# Patient Record
Sex: Male | Born: 1965
Health system: Southern US, Community
[De-identification: ages and names within clinical notes are randomized; demographics above are authoritative.]

## PROBLEM LIST (undated history)

## (undated) DIAGNOSIS — K219 Gastro-esophageal reflux disease without esophagitis: Secondary | ICD-10-CM

## (undated) DIAGNOSIS — I639 Cerebral infarction, unspecified: Secondary | ICD-10-CM

## (undated) DIAGNOSIS — E119 Type 2 diabetes mellitus without complications: Secondary | ICD-10-CM

## (undated) DIAGNOSIS — I10 Essential (primary) hypertension: Secondary | ICD-10-CM

## (undated) HISTORY — PX: NO PAST SURGERIES: SHX2092

---

## 1998-01-16 ENCOUNTER — Emergency Department (HOSPITAL_COMMUNITY): Admission: EM | Admit: 1998-01-16 | Discharge: 1998-01-16 | Payer: Self-pay | Admitting: Emergency Medicine

## 1998-01-17 ENCOUNTER — Encounter: Admission: RE | Admit: 1998-01-17 | Discharge: 1998-01-17 | Payer: Self-pay | Admitting: Internal Medicine

## 1999-01-15 ENCOUNTER — Emergency Department (HOSPITAL_COMMUNITY): Admission: EM | Admit: 1999-01-15 | Discharge: 1999-01-15 | Payer: Self-pay | Admitting: Emergency Medicine

## 1999-01-15 ENCOUNTER — Encounter: Payer: Self-pay | Admitting: Emergency Medicine

## 2016-07-19 ENCOUNTER — Emergency Department (HOSPITAL_COMMUNITY): Payer: 59

## 2016-07-19 ENCOUNTER — Inpatient Hospital Stay (HOSPITAL_COMMUNITY)
Admission: EM | Admit: 2016-07-19 | Discharge: 2016-07-21 | DRG: 065 | Disposition: A | Discharge status: Home / Self Care | Payer: 59 | Attending: Internal Medicine | Admitting: Internal Medicine

## 2016-07-19 ENCOUNTER — Observation Stay (HOSPITAL_COMMUNITY): Payer: 59

## 2016-07-19 ENCOUNTER — Encounter (HOSPITAL_COMMUNITY): Payer: Self-pay | Admitting: Emergency Medicine

## 2016-07-19 DIAGNOSIS — R29810 Facial weakness: Secondary | ICD-10-CM | POA: Diagnosis present

## 2016-07-19 DIAGNOSIS — I63412 Cerebral infarction due to embolism of left middle cerebral artery: Secondary | ICD-10-CM | POA: Diagnosis not present

## 2016-07-19 DIAGNOSIS — R2990 Unspecified symptoms and signs involving the nervous system: Secondary | ICD-10-CM

## 2016-07-19 DIAGNOSIS — Q211 Atrial septal defect: Secondary | ICD-10-CM

## 2016-07-19 DIAGNOSIS — R4781 Slurred speech: Secondary | ICD-10-CM | POA: Diagnosis present

## 2016-07-19 DIAGNOSIS — I6340 Cerebral infarction due to embolism of unspecified cerebral artery: Secondary | ICD-10-CM | POA: Insufficient documentation

## 2016-07-19 DIAGNOSIS — Z87891 Personal history of nicotine dependence: Secondary | ICD-10-CM

## 2016-07-19 DIAGNOSIS — R634 Abnormal weight loss: Secondary | ICD-10-CM

## 2016-07-19 DIAGNOSIS — I639 Cerebral infarction, unspecified: Secondary | ICD-10-CM

## 2016-07-19 DIAGNOSIS — I161 Hypertensive emergency: Secondary | ICD-10-CM | POA: Diagnosis present

## 2016-07-19 DIAGNOSIS — Z833 Family history of diabetes mellitus: Secondary | ICD-10-CM

## 2016-07-19 DIAGNOSIS — I10 Essential (primary) hypertension: Secondary | ICD-10-CM | POA: Diagnosis not present

## 2016-07-19 DIAGNOSIS — I6339 Cerebral infarction due to thrombosis of other cerebral artery: Secondary | ICD-10-CM | POA: Diagnosis not present

## 2016-07-19 DIAGNOSIS — Q2112 Patent foramen ovale: Secondary | ICD-10-CM

## 2016-07-19 DIAGNOSIS — K219 Gastro-esophageal reflux disease without esophagitis: Secondary | ICD-10-CM | POA: Diagnosis present

## 2016-07-19 HISTORY — DX: Gastro-esophageal reflux disease without esophagitis: K21.9

## 2016-07-19 HISTORY — DX: Essential (primary) hypertension: I10

## 2016-07-19 HISTORY — DX: Cerebral infarction, unspecified: I63.9

## 2016-07-19 LAB — COMPREHENSIVE METABOLIC PANEL
ALT: 47 U/L (ref 17–63)
ANION GAP: 9 (ref 5–15)
AST: 46 U/L — ABNORMAL HIGH (ref 15–41)
Albumin: 4.5 g/dL (ref 3.5–5.0)
Alkaline Phosphatase: 44 U/L (ref 38–126)
BILIRUBIN TOTAL: 0.9 mg/dL (ref 0.3–1.2)
BUN: 5 mg/dL — ABNORMAL LOW (ref 6–20)
CALCIUM: 9.9 mg/dL (ref 8.9–10.3)
CO2: 29 mmol/L (ref 22–32)
Chloride: 102 mmol/L (ref 101–111)
Creatinine, Ser: 0.72 mg/dL (ref 0.61–1.24)
GFR calc non Af Amer: >60 mL/min (ref >60)
Glucose, Bld: 113 mg/dL — ABNORMAL HIGH (ref 65–99)
Potassium: 4 mmol/L (ref 3.5–5.1)
Sodium: 140 mmol/L (ref 135–145)
TOTAL PROTEIN: 6.9 g/dL (ref 6.5–8.1)

## 2016-07-19 LAB — CBC
HCT: 37.5 % — ABNORMAL LOW (ref 39.0–52.0)
Hemoglobin: 13.2 g/dL (ref 13.0–17.0)
MCH: 33 pg (ref 26.0–34.0)
MCHC: 35.2 g/dL (ref 30.0–36.0)
MCV: 93.8 fL (ref 78.0–100.0)
PLATELETS: 206 10*3/uL (ref 150–400)
RBC: 4 MIL/uL — AB (ref 4.22–5.81)
RDW: 11.9 % (ref 11.5–15.5)
WBC: 7.3 10*3/uL (ref 4.0–10.5)

## 2016-07-19 LAB — DIFFERENTIAL
Basophils Absolute: 0 10*3/uL (ref 0.0–0.1)
Basophils Relative: 0 %
EOS ABS: 0 10*3/uL (ref 0.0–0.7)
EOS PCT: 1 %
LYMPHS ABS: 1.8 10*3/uL (ref 0.7–4.0)
LYMPHS PCT: 25 %
MONO ABS: 0.6 10*3/uL (ref 0.1–1.0)
Monocytes Relative: 8 %
Neutro Abs: 4.8 10*3/uL (ref 1.7–7.7)
Neutrophils Relative %: 66 %

## 2016-07-19 LAB — PROTIME-INR
INR: 1.02
Prothrombin Time: 13.4 seconds (ref 11.4–15.2)

## 2016-07-19 LAB — APTT: aPTT: 28 seconds (ref 24–36)

## 2016-07-19 LAB — I-STAT TROPONIN, ED: TROPONIN I, POC: 0 ng/mL (ref 0.00–0.08)

## 2016-07-19 LAB — I-STAT CHEM 8, ED
BUN: 7 mg/dL (ref 6–20)
CALCIUM ION: 1.18 mmol/L (ref 1.15–1.40)
Chloride: 101 mmol/L (ref 101–111)
Creatinine, Ser: 0.7 mg/dL (ref 0.61–1.24)
Glucose, Bld: 109 mg/dL — ABNORMAL HIGH (ref 65–99)
HCT: 39 % (ref 39.0–52.0)
HEMOGLOBIN: 13.3 g/dL (ref 13.0–17.0)
Potassium: 4.3 mmol/L (ref 3.5–5.1)
SODIUM: 141 mmol/L (ref 135–145)
TCO2: 32 mmol/L (ref 0–100)

## 2016-07-19 MED ORDER — ACETAMINOPHEN 650 MG RE SUPP: 650.0000 mg | RECTAL | Status: DC | PRN

## 2016-07-19 MED ORDER — ENOXAPARIN SODIUM 40 MG/0.4ML ~~LOC~~ SOLN
40.0000 mg | SUBCUTANEOUS | Status: DC
  Administered 2016-07-19 – 2016-07-20 (×2): 40 mg via SUBCUTANEOUS
  Filled 2016-07-19 (×2): qty 0.4

## 2016-07-19 MED ORDER — PANTOPRAZOLE SODIUM 40 MG PO TBEC
40.0000 mg | DELAYED_RELEASE_TABLET | Freq: Every day | ORAL | Status: DC
  Administered 2016-07-20 – 2016-07-21 (×2): 40 mg via ORAL
  Filled 2016-07-19 (×2): qty 1

## 2016-07-19 MED ORDER — ACETAMINOPHEN 325 MG PO TABS: 650.0000 mg | ORAL_TABLET | ORAL | Status: DC | PRN

## 2016-07-19 MED ORDER — STROKE: EARLY STAGES OF RECOVERY BOOK
Freq: Once | Status: DC
  Filled 2016-07-19: qty 1

## 2016-07-19 MED ORDER — HYDRALAZINE HCL 20 MG/ML IJ SOLN: 5.0000 mg | Freq: Three times a day (TID) | INTRAMUSCULAR | Status: DC | PRN

## 2016-07-19 MED ORDER — ACETAMINOPHEN 160 MG/5ML PO SOLN: 650.0000 mg | ORAL | Status: DC | PRN

## 2016-07-19 MED ORDER — GADOBENATE DIMEGLUMINE 529 MG/ML IV SOLN
15.0000 mL | Freq: Once | INTRAVENOUS | Status: AC | PRN
  Administered 2016-07-19: 15 mL via INTRAVENOUS

## 2016-07-19 NOTE — Progress Notes (Signed)
Returned form MRI.  

## 2016-07-19 NOTE — ED Provider Notes (Signed)
[facility]-EMERGENCY DEPT Provider Note   CSN: [id number] Arrival date & time: 07/19/16  1028     History   Chief Complaint Chief Complaint  Patient presents with  . stroke sx  . Weakness    HPI Zachary Woods is a 51 y.o. male.  Patient is a 51 year old male with a history of prior hypertension and gastroesophageal reflux disease who presents with slurred speech and right-sided numbness and tingling that started yesterday around noon. He states the symptoms got better last night and so he didn't come in until this morning. He notes that he was having trouble getting his words out and slurring of his words. He noted some tingling in his right arm and right leg. He also had a little bit of tingling in his left arm. He denies any weakness of his extremities. He noted that he had a little bit of blurry vision in his left eye. He denies any chest pain or shortness of breath. He states that his symptoms have completely resolved other than he feels like he is speech has not gotten back to 100% normal. He denies any prior history of strokes. He was previously on medications for his blood pressure but hasn't been on medication for years. He states his blood pressure improved after he lost some weight but he hasn't checked in the last couple of years.      Past Medical History:  Diagnosis Date  . Acid reflux   . Hypertension     There are no active problems to display for this patient.   History reviewed. No pertinent surgical history.     Home Medications    Prior to Admission medications   Not on File    Family History No family history on file.  Social History Social History  Substance Use Topics  . Smoking status: Former Games developer  . Smokeless tobacco: Never Used  . Alcohol use Yes     Comment: 10-12/ week-- (2-4 drinks/ day)     Allergies   Patient has no allergy information on record.   Review of Systems Review of Systems  Constitutional: Negative for chills,  diaphoresis, fatigue and fever.  HENT: Negative for congestion, rhinorrhea and sneezing.   Eyes: Positive for visual disturbance.  Respiratory: Negative for cough, chest tightness and shortness of breath.   Cardiovascular: Negative for chest pain and leg swelling.  Gastrointestinal: Negative for abdominal pain, blood in stool, diarrhea, nausea and vomiting.  Genitourinary: Negative for difficulty urinating, flank pain, frequency and hematuria.  Musculoskeletal: Negative for arthralgias and back pain.  Skin: Negative for rash.  Neurological: Positive for speech difficulty, numbness and headaches. Negative for dizziness and weakness.     Physical Exam Updated Vital Signs BP (!) 169/106 (BP Location: Right Arm)   Pulse 78   Temp 98.4 F (36.9 C) (Oral)   Resp 17   Ht 5\' 8"  (1.727 m)   Wt 165 lb (74.8 kg)   SpO2 100%   BMI 25.09 kg/m   Physical Exam  Constitutional: He is oriented to person, place, and time. He appears well-developed and well-nourished.  HENT:  Head: Normocephalic and atraumatic.  Eyes: Pupils are equal, round, and reactive to light.  Neck: Normal range of motion. Neck supple.  Cardiovascular: Normal rate, regular rhythm and normal heart sounds.   Pulmonary/Chest: Effort normal and breath sounds normal. No respiratory distress. He has no wheezes. He has no rales. He exhibits no tenderness.  Abdominal: Soft. Bowel sounds are normal. There is  no tenderness. There is no rebound and no guarding.  Musculoskeletal: Normal range of motion. He exhibits no edema.  Lymphadenopathy:    He has no cervical adenopathy.  Neurological: He is alert and oriented to person, place, and time.  Motor 5/5 all extremities Sensation grossly intact to LT all extremities Finger to Nose intact, no pronator drift CN II-XII grossly intact    Skin: Skin is warm and dry. No rash noted.  Psychiatric: He has a normal mood and affect.     ED Treatments / Results  Labs (all labs ordered  are listed, but only abnormal results are displayed) Labs Reviewed  CBC - Abnormal; Notable for the following:       Result Value   RBC 4.00 (*)    HCT 37.5 (*)    All other components within normal limits  COMPREHENSIVE METABOLIC PANEL - Abnormal; Notable for the following:    Glucose, Bld 113 (*)    BUN 5 (*)    AST 46 (*)    All other components within normal limits  I-STAT CHEM 8, ED - Abnormal; Notable for the following:    Glucose, Bld 109 (*)    All other components within normal limits  PROTIME-INR  APTT  DIFFERENTIAL  I-STAT TROPOININ, ED  CBG MONITORING, ED    EKG  EKG Interpretation  Date/Time:  Saturday Jul 19 2016 10:59:58 EDT Ventricular Rate:  74 PR Interval:  160 QRS Duration: 82 QT Interval:  392 QTC Calculation: 435 R Axis:   -2 Text Interpretation:  Normal sinus rhythm with sinus arrhythmia Inferior infarct , age undetermined Abnormal ECG No old tracing to compare Confirmed by Zachary Cianci  MD, Zachary Woods ([id number]) on 07/19/2016 11:50:16 AM       Radiology Ct Head Wo Contrast  Result Date: 07/19/2016 CLINICAL DATA:  Slurred speech and numbness in right arm. EXAM: CT HEAD WITHOUT CONTRAST TECHNIQUE: Contiguous axial images were obtained from the base of the skull through the vertex without intravenous contrast. COMPARISON:  None. FINDINGS: Brain: No evidence of acute infarction, hemorrhage, hydrocephalus, extra-axial collection or mass lesion/mass effect. Vascular: No hyperdense vessel or unexpected calcification. Skull: Normal. Negative for fracture or focal lesion. Sinuses/Orbits: No acute finding. Other: None. IMPRESSION: No cause for the patient's symptoms identified. No acute intracranial abnormality. Electronically Signed   By: Zachary Woods  Williams III M.D   On: 07/19/2016 11:38    Procedures Procedures (including critical care time)  Medications Ordered in ED Medications - No data to display   Initial Impression / Assessment and Plan / ED Course  I have  reviewed the triage vital signs and the nursing notes.  Pertinent labs & imaging results that were available during my care of the patient were reviewed by me and considered in my medical decision making (see chart for details).     I spoke with Dr. Roxy Mannsster with neurology who will evaluate the patient. I spoke with the hospitalist service to admit patient for further stroke evaluation.  Final Clinical Impressions(s) / ED Diagnoses   Final diagnoses:  Stroke determined by clinical assessment [facility](HCC)    New Prescriptions New Prescriptions   No medications on file     Rolan BuccoBelfi, Aubriauna Riner, MD 07/19/16 1222

## 2016-07-19 NOTE — Progress Notes (Addendum)
Pt arrived to the unit. Oriented to the room wife at the bedside. Dinner ordered by wife. Pt taken down to MRI. No complaints of pain or numbness to extremities. BP 183/103

## 2016-07-19 NOTE — Consult Note (Signed)
Admission H&P    Chief Complaint: Slurred speech and left-sided sensory changes.  HPI: Zachary Woods is an 51 y.o. male with hypertension who experienced onset of acute slurred speech as well as tingling and numbness involving left extremities on 07/18/2016. Speech has improved and numbness has resolved. He has not been on Plavix. There is no history of stroke or TIA. CT scan of his head showed no acute intracranial abnormality. MRI showed multiple small areas of left MCA territory acute infarction, likely embolic in of arterial source. MRI angiograms of the head and of the neck were unremarkable. NIH stroke score at the time of this evaluation was 0.  LSN: 07/18/2016 tPA Given: No: Beyond time window for treatment consideration mRankin:  Past Medical History:  Diagnosis Date  . Acid reflux   . Hypertension     Past Surgical History:  Procedure Laterality Date  . NO PAST SURGERIES      Family History  Problem Relation Age of Onset  . Diabetes Father   . Diabetes Brother    Social History:  reports that he has quit smoking. He has never used smokeless tobacco. He reports that he drinks alcohol. He reports that he does not use drugs.  Allergies:  Allergies  Allergen Reactions  . Penicillins Itching    Medications Prior to Admission  Medication Sig Dispense Refill  . ibuprofen (ADVIL,MOTRIN) 200 MG tablet Take 400-800 mg by mouth every 6 (six) hours as needed for mild pain.    . Multiple Vitamin (MULTI-VITAMIN PO) Take 1 tablet by mouth daily.    Marland Kitchen omeprazole (PRILOSEC) 20 MG capsule Take 20 mg by mouth daily.    Marland Kitchen oxymetazoline (AFRIN) 0.05 % nasal spray Place 1 spray into both nostrils 2 (two) times daily as needed for congestion.    . Tetrahydrozoline HCl (VISINE OP) Apply 2 drops to eye daily as needed.      ROS: History obtained from the patient  General ROS: negative for - chills, fatigue, fever, night sweats, weight gain or weight loss Psychological ROS: negative  for - behavioral disorder, hallucinations, memory difficulties, mood swings or suicidal ideation Ophthalmic ROS: negative for - blurry vision, double vision, eye pain or loss of vision ENT ROS: negative for - epistaxis, nasal discharge, oral lesions, sore throat, tinnitus or vertigo Allergy and Immunology ROS: negative for - hives or itchy/watery eyes Hematological and Lymphatic ROS: negative for - bleeding problems, bruising or swollen lymph nodes Endocrine ROS: negative for - galactorrhea, hair pattern changes, polydipsia/polyuria or temperature intolerance Respiratory ROS: negative for - cough, hemoptysis, shortness of breath or wheezing Cardiovascular ROS: negative for - chest pain, dyspnea on exertion, edema or irregular heartbeat Gastrointestinal ROS: negative for - abdominal pain, diarrhea, hematemesis, nausea/vomiting or stool incontinence Genito-Urinary ROS: negative for - dysuria, hematuria, incontinence or urinary frequency/urgency Musculoskeletal ROS: negative for - joint swelling or muscular weakness Neurological ROS: as noted in HPI Dermatological ROS: negative for rash and skin lesion changes  Physical Examination: Blood pressure (!) 149/89, pulse 78, temperature 98.4 F (36.9 C), resp. rate 20, height _0  (1.727 m), weight 74.8 kg (165 lb), SpO2 100 %.  HEENT-  Normocephalic, no lesions, without obvious abnormality.  Normal external eye and conjunctiva.  Normal TM's bilaterally.  Normal auditory canals and external ears. Normal external nose, mucus membranes and septum.  Normal pharynx. Neck supple with no masses, nodes, nodules or enlargement. Cardiovascular - regular rate and rhythm, S1, S2 normal, no murmur, click, rub or gallop  Lungs - chest clear, no wheezing, rales, normal symmetric air entry Abdomen - soft, non-tender; bowel sounds normal; no masses,  no organomegaly Extremities - no joint deformities, effusion, or inflammation and no edema  Neurologic  Examination: Mental Status: Alert, oriented, thought content appropriate.  Speech fluent without evidence of aphasia. Able to follow commands without difficulty. Cranial Nerves: II-Visual fields were normal. III/IV/VI-Pupils were equal and reacted. Extraocular movements were full and conjugate.    V/VII-no facial numbness; asymmetrical lower face but no demonstrable facial weakness.Marland Kitchen VIII-normal. X-normal speech and symmetrical palatal movement. XI: trapezius strength/neck flexion strength normal bilaterally XII-midline tongue extension with normal strength. Motor: 5/5 bilaterally with normal tone and bulk Sensory: Normal throughout. Deep Tendon Reflexes: 2+ and symmetric. Plantars: Flexor bilaterally Cerebellar: Normal finger-to-nose testing. Carotid auscultation: Normal  Results for orders placed or performed during the hospital encounter of 07/19/16 (from the past 48 hour(s))  Protime-INR     Status: None   Collection Time: 07/19/16 11:00 AM  Result Value Ref Range   Prothrombin Time 13.4 11.4 - 15.2 seconds   INR 1.02   APTT     Status: None   Collection Time: 07/19/16 11:00 AM  Result Value Ref Range   aPTT 28 24 - 36 seconds  CBC     Status: Abnormal   Collection Time: 07/19/16 11:00 AM  Result Value Ref Range   WBC 7.3 4.0 - 10.5 K/uL   RBC 4.00 (L) 4.22 - 5.81 MIL/uL   Hemoglobin 13.2 13.0 - 17.0 g/dL   HCT 37.5 (L) 39.0 - 52.0 %   MCV 93.8 78.0 - 100.0 fL   MCH 33.0 26.0 - 34.0 pg   MCHC 35.2 30.0 - 36.0 g/dL   RDW 11.9 11.5 - 15.5 %   Platelets 206 150 - 400 K/uL  Differential     Status: None   Collection Time: 07/19/16 11:00 AM  Result Value Ref Range   Neutrophils Relative % 66 %   Neutro Abs 4.8 1.7 - 7.7 K/uL   Lymphocytes Relative 25 %   Lymphs Abs 1.8 0.7 - 4.0 K/uL   Monocytes Relative 8 %   Monocytes Absolute 0.6 0.1 - 1.0 K/uL   Eosinophils Relative 1 %   Eosinophils Absolute 0.0 0.0 - 0.7 K/uL   Basophils Relative 0 %   Basophils Absolute 0.0  0.0 - 0.1 K/uL  Comprehensive metabolic panel     Status: Abnormal   Collection Time: 07/19/16 11:00 AM  Result Value Ref Range   Sodium 140 135 - 145 mmol/L   Potassium 4.0 3.5 - 5.1 mmol/L   Chloride 102 101 - 111 mmol/L   CO2 29 22 - 32 mmol/L   Glucose, Bld 113 (H) 65 - 99 mg/dL   BUN 5 (L) 6 - 20 mg/dL   Creatinine, Ser 0.72 0.61 - 1.24 mg/dL   Calcium 9.9 8.9 - 10.3 mg/dL   Total Protein 6.9 6.5 - 8.1 g/dL   Albumin 4.5 3.5 - 5.0 g/dL   AST 46 (H) 15 - 41 U/L   ALT 47 17 - 63 U/L   Alkaline Phosphatase 44 38 - 126 U/L   Total Bilirubin 0.9 0.3 - 1.2 mg/dL   GFR calc non Af Amer >60 >60 mL/min   GFR calc Af Amer >60 >60 mL/min    Comment: (NOTE) The eGFR has been calculated using the CKD EPI equation. This calculation has not been validated in all clinical situations. eGFR's persistently <60 mL/min signify possible Chronic  Kidney Disease.    Anion gap 9 5 - 15  I-stat troponin, ED     Status: None   Collection Time: 07/19/16 11:10 AM  Result Value Ref Range   Troponin i, poc 0.00 0.00 - 0.08 ng/mL   Comment 3            Comment: Due to the release kinetics of cTnI, a negative result within the first hours of the onset of symptoms does not rule out myocardial infarction with certainty. If myocardial infarction is still suspected, repeat the test at appropriate intervals.   I-Stat Chem 8, ED     Status: Abnormal   Collection Time: 07/19/16 11:12 AM  Result Value Ref Range   Sodium 141 135 - 145 mmol/L   Potassium 4.3 3.5 - 5.1 mmol/L   Chloride 101 101 - 111 mmol/L   BUN 7 6 - 20 mg/dL   Creatinine, Ser 0.70 0.61 - 1.24 mg/dL   Glucose, Bld 109 (H) 65 - 99 mg/dL   Calcium, Ion 1.18 1.15 - 1.40 mmol/L   TCO2 32 0 - 100 mmol/L   Hemoglobin 13.3 13.0 - 17.0 g/dL   HCT 39.0 39.0 - 52.0 %   Dg Chest 2 View  Result Date: 07/19/2016 CLINICAL DATA:  Blurred vision, slurred speech, right arm numbness since yesterday knee in. Headaches over the last week. EXAM: CHEST   2 VIEW COMPARISON:  None. FINDINGS: Normal heart size. Lungs clear. No pneumothorax. No pleural effusion. Mid level thoracic compression deformity has a chronic appearance. IMPRESSION: No active cardiopulmonary disease. Electronically Signed   By: Marybelle Killings M.D.   On: 07/19/2016 13:06   Ct Head Wo Contrast  Result Date: 07/19/2016 CLINICAL DATA:  Slurred speech and numbness in right arm. EXAM: CT HEAD WITHOUT CONTRAST TECHNIQUE: Contiguous axial images were obtained from the base of the skull through the vertex without intravenous contrast. COMPARISON:  None. FINDINGS: Brain: No evidence of acute infarction, hemorrhage, hydrocephalus, extra-axial collection or mass lesion/mass effect. Vascular: No hyperdense vessel or unexpected calcification. Skull: Normal. Negative for fracture or focal lesion. Sinuses/Orbits: No acute finding. Other: None. IMPRESSION: No cause for the patient's symptoms identified. No acute intracranial abnormality. Electronically Signed   By: Dorise Bullion III M.D   On: 07/19/2016 11:38   Mr Angiogram Neck W Contrast  Result Date: 07/19/2016 CLINICAL DATA:  Slurred speech and RIGHT sided numbness and tingling beginning yesterday at noon, word-finding difficulties. Symptoms predominately resolved. History of hypertension. EXAM: MRI HEAD WITHOUT CONTRAST MRA HEAD WITHOUT CONTRAST MRA NECK WITHOUT AND WITH CONTRAST TECHNIQUE: Multiplanar, multiecho pulse sequences of the brain and surrounding structures were obtained without intravenous contrast. Angiographic images of the Circle of Willis were obtained using MRA technique without intravenous contrast. Angiographic images of the neck were obtained using MRA technique without and with intravenous contrast. Carotid stenosis measurements (when applicable) are obtained utilizing NASCET criteria, using the distal internal carotid diameter as the denominator. CONTRAST:  60m MULTIHANCE GADOBENATE DIMEGLUMINE 529 MG/ML IV SOLN COMPARISON:  CT  HEAD Jul 19, 2016 at 1126 hours FINDINGS: MRI HEAD FINDINGS- mildly motion degraded examination. BRAIN: Multiple subcentimeter meter foci of reduced diffusion and FLAIR T2 hyperintense signal LEFT frontal lobe, with low ADC values. Subcentimeter linear reduced diffusion LEFT periventricular caudate tail. No susceptibility artifact to suggest hemorrhage. The ventricles and sulci are normal for patient's age. No suspicious parenchymal signal, masses or mass effect. No abnormal extra-axial fluid collections. VASCULAR: Normal major intracranial vascular flow voids present at  skull base. SKULL AND UPPER CERVICAL SPINE: No abnormal sellar expansion. No suspicious calvarial bone marrow signal. Craniocervical junction maintained. SINUSES/ORBITS: LEFT maxillary mucosal retention cysts. Mastoid air cells are well aerated. The included ocular globes and orbital contents are non-suspicious. OTHER: Patient is edentulous. MRA HEAD FINDINGS- mildly motion degraded examination. ANTERIOR CIRCULATION: Normal flow related enhancement of the included cervical, petrous, cavernous and supraclinoid internal carotid arteries. Patent anterior communicating artery. Normal flow related enhancement of the anterior and middle cerebral arteries, including distal segments. Mild luminal irregularity LEFT M1 segment. Focal moderate stenosis LEFT M2 inferior segment origin. No large vessel occlusion, high-grade stenosis, aneurysm. POSTERIOR CIRCULATION: LEFT vertebral artery is dominant. Basilar artery is patent, with normal flow related enhancement of the main branch vessels. Normal flow related enhancement of the posterior cerebral arteries. No large vessel occlusion, high-grade stenosis, abnormal luminal irregularity, aneurysm. ANATOMIC VARIANTS: None. Source images and MIP images were reviewed. MRA NECK FINDINGS ANTERIOR CIRCULATION: The common carotid arteries are widely patent bilaterally. The carotid bifurcations are patent bilaterally  without hemodynamically significant stenosis by NASCET criteria. No flow limiting stenosis or luminal irregularity. POSTERIOR CIRCULATION: Bilateral vertebral arteries are patent to the vertebrobasilar junction. No flow limiting stenosis or luminal irregularity. Source images and MIP images were reviewed. IMPRESSION: MRI HEAD: Acute subcentimeter LEFT MCA territory infarcts most compatible with embolic etiology. Otherwise negative mildly motion degraded noncontrast MRI head. MRA HEAD: No emergent large vessel occlusion. Mild LEFT M1 luminal regularity and moderate stenosis LEFT M2 origin secondary to atherosclerosis, thromboembolism or motion artifact. MRA NECK: No hemodynamically significant stenosis or acute vascular process. Electronically Signed   By: Elon Alas M.D.   On: 07/19/2016 16:26   Mr Brain Wo Contrast  Result Date: 07/19/2016 CLINICAL DATA:  Slurred speech and RIGHT sided numbness and tingling beginning yesterday at noon, word-finding difficulties. Symptoms predominately resolved. History of hypertension. EXAM: MRI HEAD WITHOUT CONTRAST MRA HEAD WITHOUT CONTRAST MRA NECK WITHOUT AND WITH CONTRAST TECHNIQUE: Multiplanar, multiecho pulse sequences of the brain and surrounding structures were obtained without intravenous contrast. Angiographic images of the Circle of Willis were obtained using MRA technique without intravenous contrast. Angiographic images of the neck were obtained using MRA technique without and with intravenous contrast. Carotid stenosis measurements (when applicable) are obtained utilizing NASCET criteria, using the distal internal carotid diameter as the denominator. CONTRAST:  73m MULTIHANCE GADOBENATE DIMEGLUMINE 529 MG/ML IV SOLN COMPARISON:  CT HEAD Jul 19, 2016 at 1126 hours FINDINGS: MRI HEAD FINDINGS- mildly motion degraded examination. BRAIN: Multiple subcentimeter meter foci of reduced diffusion and FLAIR T2 hyperintense signal LEFT frontal lobe, with low ADC  values. Subcentimeter linear reduced diffusion LEFT periventricular caudate tail. No susceptibility artifact to suggest hemorrhage. The ventricles and sulci are normal for patient's age. No suspicious parenchymal signal, masses or mass effect. No abnormal extra-axial fluid collections. VASCULAR: Normal major intracranial vascular flow voids present at skull base. SKULL AND UPPER CERVICAL SPINE: No abnormal sellar expansion. No suspicious calvarial bone marrow signal. Craniocervical junction maintained. SINUSES/ORBITS: LEFT maxillary mucosal retention cysts. Mastoid air cells are well aerated. The included ocular globes and orbital contents are non-suspicious. OTHER: Patient is edentulous. MRA HEAD FINDINGS- mildly motion degraded examination. ANTERIOR CIRCULATION: Normal flow related enhancement of the included cervical, petrous, cavernous and supraclinoid internal carotid arteries. Patent anterior communicating artery. Normal flow related enhancement of the anterior and middle cerebral arteries, including distal segments. Mild luminal irregularity LEFT M1 segment. Focal moderate stenosis LEFT M2 inferior segment origin. No large vessel occlusion,  high-grade stenosis, aneurysm. POSTERIOR CIRCULATION: LEFT vertebral artery is dominant. Basilar artery is patent, with normal flow related enhancement of the main branch vessels. Normal flow related enhancement of the posterior cerebral arteries. No large vessel occlusion, high-grade stenosis, abnormal luminal irregularity, aneurysm. ANATOMIC VARIANTS: None. Source images and MIP images were reviewed. MRA NECK FINDINGS ANTERIOR CIRCULATION: The common carotid arteries are widely patent bilaterally. The carotid bifurcations are patent bilaterally without hemodynamically significant stenosis by NASCET criteria. No flow limiting stenosis or luminal irregularity. POSTERIOR CIRCULATION: Bilateral vertebral arteries are patent to the vertebrobasilar junction. No flow limiting  stenosis or luminal irregularity. Source images and MIP images were reviewed. IMPRESSION: MRI HEAD: Acute subcentimeter LEFT MCA territory infarcts most compatible with embolic etiology. Otherwise negative mildly motion degraded noncontrast MRI head. MRA HEAD: No emergent large vessel occlusion. Mild LEFT M1 luminal regularity and moderate stenosis LEFT M2 origin secondary to atherosclerosis, thromboembolism or motion artifact. MRA NECK: No hemodynamically significant stenosis or acute vascular process. Electronically Signed   By: Elon Alas M.D.   On: 07/19/2016 16:26   Mr Jodene Nam Head/brain ID [id number]  Result Date: 07/19/2016 CLINICAL DATA:  Slurred speech and RIGHT sided numbness and tingling beginning yesterday at noon, word-finding difficulties. Symptoms predominately resolved. History of hypertension. EXAM: MRI HEAD WITHOUT CONTRAST MRA HEAD WITHOUT CONTRAST MRA NECK WITHOUT AND WITH CONTRAST TECHNIQUE: Multiplanar, multiecho pulse sequences of the brain and surrounding structures were obtained without intravenous contrast. Angiographic images of the Circle of Willis were obtained using MRA technique without intravenous contrast. Angiographic images of the neck were obtained using MRA technique without and with intravenous contrast. Carotid stenosis measurements (when applicable) are obtained utilizing NASCET criteria, using the distal internal carotid diameter as the denominator. CONTRAST:  21m MULTIHANCE GADOBENATE DIMEGLUMINE 529 MG/ML IV SOLN COMPARISON:  CT HEAD Jul 19, 2016 at 1126 hours FINDINGS: MRI HEAD FINDINGS- mildly motion degraded examination. BRAIN: Multiple subcentimeter meter foci of reduced diffusion and FLAIR T2 hyperintense signal LEFT frontal lobe, with low ADC values. Subcentimeter linear reduced diffusion LEFT periventricular caudate tail. No susceptibility artifact to suggest hemorrhage. The ventricles and sulci are normal for patient's age. No suspicious parenchymal signal, masses  or mass effect. No abnormal extra-axial fluid collections. VASCULAR: Normal major intracranial vascular flow voids present at skull base. SKULL AND UPPER CERVICAL SPINE: No abnormal sellar expansion. No suspicious calvarial bone marrow signal. Craniocervical junction maintained. SINUSES/ORBITS: LEFT maxillary mucosal retention cysts. Mastoid air cells are well aerated. The included ocular globes and orbital contents are non-suspicious. OTHER: Patient is edentulous. MRA HEAD FINDINGS- mildly motion degraded examination. ANTERIOR CIRCULATION: Normal flow related enhancement of the included cervical, petrous, cavernous and supraclinoid internal carotid arteries. Patent anterior communicating artery. Normal flow related enhancement of the anterior and middle cerebral arteries, including distal segments. Mild luminal irregularity LEFT M1 segment. Focal moderate stenosis LEFT M2 inferior segment origin. No large vessel occlusion, high-grade stenosis, aneurysm. POSTERIOR CIRCULATION: LEFT vertebral artery is dominant. Basilar artery is patent, with normal flow related enhancement of the main branch vessels. Normal flow related enhancement of the posterior cerebral arteries. No large vessel occlusion, high-grade stenosis, abnormal luminal irregularity, aneurysm. ANATOMIC VARIANTS: None. Source images and MIP images were reviewed. MRA NECK FINDINGS ANTERIOR CIRCULATION: The common carotid arteries are widely patent bilaterally. The carotid bifurcations are patent bilaterally without hemodynamically significant stenosis by NASCET criteria. No flow limiting stenosis or luminal irregularity. POSTERIOR CIRCULATION: Bilateral vertebral arteries are patent to the vertebrobasilar junction. No flow limiting stenosis or luminal irregularity.  Source images and MIP images were reviewed. IMPRESSION: MRI HEAD: Acute subcentimeter LEFT MCA territory infarcts most compatible with embolic etiology. Otherwise negative mildly motion degraded  noncontrast MRI head. MRA HEAD: No emergent large vessel occlusion. Mild LEFT M1 luminal regularity and moderate stenosis LEFT M2 origin secondary to atherosclerosis, thromboembolism or motion artifact. MRA NECK: No hemodynamically significant stenosis or acute vascular process. Electronically Signed   By: Elon Alas M.D.   On: 07/19/2016 16:26    Assessment: 51 y.o. male with multiple risk factors for stroke presenting following onset of acute left MCA territory small ischemic infarctions, likely embolic in origin.  Stroke Risk Factors - hypertension and smoking  Plan: 1. HgbA1c, fasting lipid panel 2. PT consult, OT consult, Speech consult 3. Echocardiogram 4. Prophylactic therapy-Antiplatelet med: Aspirin  5. Risk factor modification 6. Telemetry monitoring  C.R. Nicole Kindred, MD [facility] [phone]  07/19/2016, 8:19 PM

## 2016-07-19 NOTE — H&P (Signed)
History and Physical    Zachary Woods UJW:[id number] DOB: 15-Feb-1966 DOA: 07/19/2016   PCP: Patient, No Pcp Per   Patient coming from:  Home    Chief Complaint: slurred speech and blurred vision   HPI: Zachary Woods is a 51 y.o. male with medical history significant for HTN, GERD, who presented with 1 day history of slurred speech and bilateral all extremity numbness with tingling, getting better last night, then returning this morning for which he sought medical attention. He denies any weakness. Denies any history of TIA. Denies vertigo dizziness or vision changes. He reported L lacunar headache prior to above symptoms. Denies  dysphagia. No confusion or seizures. Denies any chest pain, or shortness of breath. Denies any fever or chills, or night sweats. No tobacco, quit 1 year ago. No new meds or hormonal supplements. Does not take a regular ASA a day ot other antiplatelets or anticoagulants. Denies any recent long distance trips or recent surgeries. No sick contacts. Stressors present at work. NO recent surgeries. Patient was not on scheduled medications. Patient is very active, exercising daily.  No family history of stroke, but strong history of DM in father and brother.  Neurology to see. Will admit for further evaluation and treatment.   ED Course:  BP (!) 160/93   Pulse 63   Temp 98.4 F (36.9 C)   Resp (!) 21   Ht 5\' 8"  (1.727 m)   Wt 74.8 kg (165 lb)   SpO2 100%   BMI 25.09 kg/m    CT head neg for acute intracranial abnormalities sodium 141 potassium 4.3  glucose 109  creatinine 0.7  troponin 0  white count 7.3 hemoglobin 13.3 platelets 206 EKG SR undetermined inferior infarct , with no other tracing to compare.   Review of Systems:  As per HPI otherwise all other systems reviewed and are negative  Past Medical History:  Diagnosis Date  . Acid reflux   . Hypertension     Past Surgical History:  Procedure Laterality Date  . NO PAST SURGERIES      Social  History Social History   Social History  . Marital status: Single    Spouse name: N/A  . Number of children: N/A  . Years of education: N/A   Occupational History  . Not on file.   Social History Main Topics  . Smoking status: Former Games developermoker  . Smokeless tobacco: Never Used  . Alcohol use Yes     Comment: 10-12/ week-- (2-4 drinks/ day)  . Drug use: No  . Sexual activity: Yes   Other Topics Concern  . Not on file   Social History Narrative  . No narrative on file     Allergies  Allergen Reactions  . Penicillins Itching    Family History  Problem Relation Age of Onset  . Diabetes Father   . Diabetes Brother       Prior to Admission medications   Not on File    Physical Exam:  Vitals:   07/19/16 1136 07/19/16 1200 07/19/16 1230 07/19/16 1242  BP: (!) 169/106 (!) 169/90 (!) 160/93   Pulse: 78 72 63   Resp: 17 13 (!) 21   Temp:    98.4 F (36.9 C)  TempSrc:      SpO2: 100% 99% 100%   Weight:      Height:       Constitutional: NAD, calm, comfortable  Eyes: PERRL, lids and conjunctivae normal ENMT: Mucous membranes are moist,  without exudate or lesions  Neck: normal, supple, no masses, no thyromegaly Respiratory: clear to auscultation bilaterally, no wheezing, no crackles. Normal respiratory effort  Cardiovascular: Regular rate and rhythm, no murmurs, rubs or gallops. No extremity edema. 2+ pedal pulses. No carotid bruits.  Abdomen: Soft, non tender, No hepatosplenomegaly. Bowel sounds positive.  Musculoskeletal: no clubbing / cyanosis. Moves all extremities Skin: no jaundice, No lesions.  Neurologic: Sensation intact  Strength equal in all extremities Psychiatric:   Alert and oriented x 3. Normal mood.     Labs on Admission: I have personally reviewed following labs and imaging studies  CBC:  Recent Labs Lab 07/19/16 1100 07/19/16 1112  WBC 7.3  --   NEUTROABS 4.8  --   HGB 13.2 13.3  HCT 37.5* 39.0  MCV 93.8  --   PLT 206  --      Basic Metabolic Panel:  Recent Labs Lab 07/19/16 1100 07/19/16 1112  NA 140 141  K 4.0 4.3  CL 102 101  CO2 29  --   GLUCOSE 113* 109*  BUN 5* 7  CREATININE 0.72 0.70  CALCIUM 9.9  --     GFR: Estimated Creatinine Clearance: 106.9 mL/min (by C-G formula based on SCr of 0.7 mg/dL).  Liver Function Tests:  Recent Labs Lab 07/19/16 1100  AST 46*  ALT 47  ALKPHOS 44  BILITOT 0.9  PROT 6.9  ALBUMIN 4.5   No results for input(s): LIPASE, AMYLASE in the last 168 hours. No results for input(s): AMMONIA in the last 168 hours.  Coagulation Profile:  Recent Labs Lab 07/19/16 1100  INR 1.02    Cardiac Enzymes: No results for input(s): CKTOTAL, CKMB, CKMBINDEX, TROPONINI in the last 168 hours.  BNP (last 3 results) No results for input(s): PROBNP in the last 8760 hours.  HbA1C: No results for input(s): HGBA1C in the last 72 hours.  CBG: No results for input(s): GLUCAP in the last 168 hours.  Lipid Profile: No results for input(s): CHOL, HDL, LDLCALC, TRIG, CHOLHDL, LDLDIRECT in the last 72 hours.  Thyroid Function Tests: No results for input(s): TSH, T4TOTAL, FREET4, T3FREE, THYROIDAB in the last 72 hours.  Anemia Panel: No results for input(s): VITAMINB12, FOLATE, FERRITIN, TIBC, IRON, RETICCTPCT in the last 72 hours.  Urine analysis: No results found for: COLORURINE, APPEARANCEUR, LABSPEC, PHURINE, GLUCOSEU, HGBUR, BILIRUBINUR, KETONESUR, PROTEINUR, UROBILINOGEN, NITRITE, LEUKOCYTESUR  Sepsis Labs: @LABRCNTIP (procalcitonin:4,lacticidven:4) )No results found for this or any previous visit (from the past 240 hour(s)).   Radiological Exams on Admission: Dg Chest 2 View  Result Date: 07/19/2016 CLINICAL DATA:  Blurred vision, slurred speech, right arm numbness since yesterday knee in. Headaches over the last week. EXAM: CHEST  2 VIEW COMPARISON:  None. FINDINGS: Normal heart size. Lungs clear. No pneumothorax. No pleural effusion. Mid level thoracic  compression deformity has a chronic appearance. IMPRESSION: No active cardiopulmonary disease. Electronically Signed   By: Jolaine Click M.D.   On: 07/19/2016 13:06   Ct Head Wo Contrast  Result Date: 07/19/2016 CLINICAL DATA:  Slurred speech and numbness in right arm. EXAM: CT HEAD WITHOUT CONTRAST TECHNIQUE: Contiguous axial images were obtained from the base of the skull through the vertex without intravenous contrast. COMPARISON:  None. FINDINGS: Brain: No evidence of acute infarction, hemorrhage, hydrocephalus, extra-axial collection or mass lesion/mass effect. Vascular: No hyperdense vessel or unexpected calcification. Skull: Normal. Negative for fracture or focal lesion. Sinuses/Orbits: No acute finding. Other: None. IMPRESSION: No cause for the patient's symptoms identified. No acute intracranial  abnormality. Electronically Signed   By: Gerome Sam III M.D   On: 07/19/2016 11:38    EKG: Independently reviewed.  Assessment/Plan Active Problems:   Hypertension   GERD (gastroesophageal reflux disease)   Malignant hypertension   Stroke-like symptoms    Stroke like symptoms  last known normal was the night prior to hospitalization. Had L blurred vision, L lacunar HA, and dysarthria, now with minimal dysarthria as lingering sx. CT head shows no acute intracranial abnormalities.  Risk factors for CVA include HTN and family history of DM.    Admit to Tele / Obs Stroke order set  MRI/MRA brain   MRA neck Allow permissive HTN, hydralazine for BP >210/110 2 D Echo   lipid panel A1C Aspirin  Neuro  following, awaiting recommendations  Malignant Hypertension, on presentation was 169/103, Patient was not on meds  BP (!) 160/93   Pulse 63  Presented with L lacunar headache and L blurred vision,  HTN versus stroke like symptoms as above  Hydralazine 5-10 mg IV q 6 hrs prn for BP 210/110  Will need workup for secondary hypertension as outpatient  Abnormal EKG, admission EKG showed sinus  rhythm with possible failure infarction, old age undetermined. Patient is chest pain free. Troponin 0. Recommend to establish care with a PCP, and to follow up as an outpatient  GERD, no acute symptoms Protonix daily Follow up with PCP   DVT prophylaxis: Lovenox  Code Status:   Full    Family Communication:  Discussed with patient Disposition Plan: Expect patient to be discharged to home after condition improves Consults called:    Neurology by EDP Admission status:Tele Obs     [facility] E, PA-C [facility]   07/19/2016, 1:13 PM

## 2016-07-19 NOTE — ED Triage Notes (Signed)
Stroke symptoms started yesterday-- speech slurred, right sided numbness/tingling-- "felt like someone was sticking me" blurry vision, symptoms have gone away except for speech this morning.

## 2016-07-20 ENCOUNTER — Inpatient Hospital Stay (HOSPITAL_COMMUNITY): Payer: 59

## 2016-07-20 ENCOUNTER — Observation Stay (HOSPITAL_BASED_OUTPATIENT_CLINIC_OR_DEPARTMENT_OTHER): Payer: 59

## 2016-07-20 ENCOUNTER — Observation Stay (HOSPITAL_COMMUNITY): Payer: 59

## 2016-07-20 DIAGNOSIS — I340 Nonrheumatic mitral (valve) insufficiency: Secondary | ICD-10-CM | POA: Diagnosis not present

## 2016-07-20 DIAGNOSIS — I638 Other cerebral infarction: Secondary | ICD-10-CM | POA: Diagnosis not present

## 2016-07-20 DIAGNOSIS — I10 Essential (primary) hypertension: Secondary | ICD-10-CM | POA: Diagnosis present

## 2016-07-20 DIAGNOSIS — R29810 Facial weakness: Secondary | ICD-10-CM | POA: Diagnosis present

## 2016-07-20 DIAGNOSIS — I161 Hypertensive emergency: Secondary | ICD-10-CM | POA: Diagnosis present

## 2016-07-20 DIAGNOSIS — R2990 Unspecified symptoms and signs involving the nervous system: Secondary | ICD-10-CM | POA: Diagnosis present

## 2016-07-20 DIAGNOSIS — Z833 Family history of diabetes mellitus: Secondary | ICD-10-CM | POA: Diagnosis not present

## 2016-07-20 DIAGNOSIS — I639 Cerebral infarction, unspecified: Secondary | ICD-10-CM

## 2016-07-20 DIAGNOSIS — K219 Gastro-esophageal reflux disease without esophagitis: Secondary | ICD-10-CM | POA: Diagnosis present

## 2016-07-20 DIAGNOSIS — Q211 Atrial septal defect: Secondary | ICD-10-CM | POA: Diagnosis not present

## 2016-07-20 DIAGNOSIS — Z87891 Personal history of nicotine dependence: Secondary | ICD-10-CM | POA: Diagnosis not present

## 2016-07-20 DIAGNOSIS — I63412 Cerebral infarction due to embolism of left middle cerebral artery: Principal | ICD-10-CM

## 2016-07-20 DIAGNOSIS — R4781 Slurred speech: Secondary | ICD-10-CM | POA: Diagnosis present

## 2016-07-20 DIAGNOSIS — I6340 Cerebral infarction due to embolism of unspecified cerebral artery: Secondary | ICD-10-CM | POA: Insufficient documentation

## 2016-07-20 LAB — LIPID PANEL
Cholesterol: 162 mg/dL (ref 0–200)
HDL: 49 mg/dL (ref >40)
LDL CALC: 68 mg/dL (ref 0–99)
TRIGLYCERIDES: 227 mg/dL — AB (ref <150)
Total CHOL/HDL Ratio: 3.3 RATIO
VLDL: 45 mg/dL — AB (ref 0–40)

## 2016-07-20 LAB — RAPID URINE DRUG SCREEN, HOSP PERFORMED
Amphetamines: NOT DETECTED
BENZODIAZEPINES: NOT DETECTED
Barbiturates: NOT DETECTED
COCAINE: NOT DETECTED
Opiates: NOT DETECTED
Tetrahydrocannabinol: POSITIVE — AB

## 2016-07-20 LAB — ECHOCARDIOGRAM COMPLETE
Height: 68 in
Weight: 2640 [oz_av]

## 2016-07-20 LAB — HIV ANTIBODY (ROUTINE TESTING W REFLEX): HIV Screen 4th Generation wRfx: NONREACTIVE

## 2016-07-20 MED ORDER — ATORVASTATIN CALCIUM 10 MG PO TABS
20.0000 mg | ORAL_TABLET | Freq: Every day | ORAL | Status: DC
  Administered 2016-07-20: 20 mg via ORAL
  Filled 2016-07-20: qty 2

## 2016-07-20 MED ORDER — IOPAMIDOL (ISOVUE-300) INJECTION 61%
INTRAVENOUS | Status: AC
  Administered 2016-07-20: 100 mL
  Filled 2016-07-20: qty 100

## 2016-07-20 MED ORDER — ASPIRIN EC 325 MG PO TBEC
325.0000 mg | DELAYED_RELEASE_TABLET | Freq: Every day | ORAL | Status: DC
  Administered 2016-07-20: 325 mg via ORAL
  Filled 2016-07-20: qty 1

## 2016-07-20 NOTE — Evaluation (Signed)
Physical Therapy Evaluation Patient Details Name: Zachary Woods MRN: [id number] DOB: 16-Mar-1965 Today's Date: 07/20/2016   History of Present Illness  Pt is 51 y.o. male with hypertension who experienced onset of acute slurred speech as well as tingling and numbness involving left extremities on 07/18/2016.  Clinical Impression  PT eval complete. Pt is independent with all functional mobility, scoring 24/24 on DGI. He reports numbness and tingling L extremities has resolved. See below for details. No further PT intervention indicated. PT signing off.    Follow Up Recommendations No PT follow up    Equipment Recommendations  None recommended by PT    Recommendations for Other Services       Precautions / Restrictions Precautions Precautions: None      Mobility  Bed Mobility Overal bed mobility: Independent                Transfers Overall transfer level: Independent Equipment used: None                Ambulation/Gait Ambulation/Gait assistance: Independent   Assistive device: None Gait Pattern/deviations: WFL(Within Functional Limits)   Gait velocity interpretation: at or above normal speed for age/gender    Stairs Stairs: Yes Stairs assistance: Modified independent (Device/Increase time) Stair Management: One rail Right;Alternating pattern Number of Stairs: 12    Wheelchair Mobility    Modified Rankin (Stroke Patients Only) Modified Rankin (Stroke Patients Only) Pre-Morbid Rankin Score: No symptoms Modified Rankin: No significant disability     Balance Overall balance assessment: Independent                               Standardized Balance Assessment Standardized Balance Assessment : Dynamic Gait Index   Dynamic Gait Index Level Surface: Normal Change in Gait Speed: Normal Gait with Horizontal Head Turns: Normal Gait with Vertical Head Turns: Normal Gait and Pivot Turn: Normal Step Over Obstacle: Normal Step Around  Obstacles: Normal Steps: Normal Total Score: 24       Pertinent Vitals/Pain Pain Assessment: No/denies pain    Home Living Family/patient expects to be discharged to:: Private residence Living Arrangements: Spouse/significant other Available Help at Discharge: Family;Available 24 hours/day Type of Home: Mobile home Home Access: Stairs to enter Entrance Stairs-Rails: Doctor, general practiceight;Left Entrance Stairs-Number of Steps: 3 Home Layout: One level Home Equipment: None      Prior Function Level of Independence: Independent         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     Hand Dominance   Dominant Hand: Left    Extremity/Trunk Assessment   Upper Extremity Assessment Upper Extremity Assessment: Defer to OT evaluation    Lower Extremity Assessment Lower Extremity Assessment: Overall WFL for tasks assessed (strength is symmetrical, 5/5)    Cervical / Trunk Assessment Cervical / Trunk Assessment: Normal  Communication   Communication: No difficulties  Cognition Arousal/Alertness: Awake/alert Behavior During Therapy: WFL for tasks assessed/performed Overall Cognitive Status: Within Functional Limits for tasks assessed                                        General Comments General comments (skin integrity, edema, etc.): Facial droop noted on R. Speech is clear.    Exercises     Assessment/Plan    PT Assessment Patent does not need any further PT services  PT Problem List  PT Treatment Interventions      PT Goals (Current goals can be found in the Care Plan section)  Acute Rehab PT Goals Patient Stated Goal: return to work PT Goal Formulation: All assessment and education complete, DC therapy    Frequency     Barriers to discharge        Co-evaluation               AM-PAC PT "6 Clicks" Daily Activity  Outcome Measure Difficulty turning over in bed (including adjusting bedclothes, sheets and blankets)?: None Difficulty moving from  lying on back to sitting on the side of the bed? : None Difficulty sitting down on and standing up from a chair with arms (e.g., wheelchair, bedside commode, etc,.)?: None Help needed moving to and from a bed to chair (including a wheelchair)?: None Help needed walking in hospital room?: None Help needed climbing 3-5 steps with a railing? : None 6 Click Score: 24    End of Session   Activity Tolerance: Patient tolerated treatment well Patient left: in bed;with call bell/phone within reach;with family/visitor present Nurse Communication: Mobility status PT Visit Diagnosis: Other symptoms and signs involving the nervous system (R29.898)    Time: 4540-9811 PT Time Calculation (min) (ACUTE ONLY): 21 min   Charges:   PT Evaluation $PT Eval Low Complexity: 1 Procedure     PT G Codes:   PT G-Codes **NOT FOR INPATIENT CLASS** Functional Assessment Tool Used: AM-PAC 6 Clicks Basic Mobility Functional Limitation: Mobility: Walking and moving around Mobility: Walking and Moving Around Current Status (B1478): 0 percent impaired, limited or restricted Mobility: Walking and Moving Around Goal Status (G9562): 0 percent impaired, limited or restricted Mobility: Walking and Moving Around Discharge Status (Z3086): 0 percent impaired, limited or restricted    Aida Raider, PT  Office # [phone] Pager [phone]   Ilda Foil 07/20/2016, 3:09 PM

## 2016-07-20 NOTE — Plan of Care (Signed)
Problem: Education: Goal: Knowledge of disease or condition will improve Outcome: Progressing Will continue to education pt on CVAs and TIAs, the difference.  Goal: Knowledge of patient specific risk factors addressed and post discharge goals established will improve Outcome: Progressing Will continue to assess pt for any residual of a CVA or recurrent stroke signs.   Problem: Safety: Goal: Ability to remain free from injury will improve Outcome: Progressing Pt has verbalizes understanding the importance of having someone helping him out of bed while he remains weak. Pt's wife remains at bedside and will continue to help him.   Problem: Skin Integrity: Goal: Risk for impaired skin integrity will decrease Outcome: Progressing Skin - no sores or suspicious lesions or rashes or color changes

## 2016-07-20 NOTE — Progress Notes (Signed)
  Echocardiogram 2D Echocardiogram has been performed.  Taeler Winning T Jamille Fisher 07/20/2016, 12:04 PM

## 2016-07-20 NOTE — Progress Notes (Signed)
VASCULAR LAB PRELIMINARY  PRELIMINARY  PRELIMINARY  PRELIMINARY  Bilateral lower extremity venous duplex completed.    Preliminary report:  Bilateral:  No evidence of DVT, superficial thrombosis, or Baker's Cyst.   Zachary Woods, RVS 07/20/2016, 1:38 PM

## 2016-07-20 NOTE — Progress Notes (Signed)
STROKE TEAM PROGRESS NOTE   HISTORY OF PRESENT ILLNESS (per record) Zachary Woods is an 51 y.o. male with hypertension who experienced onset of acute slurred speech as well as tingling and numbness involving left extremities on 07/18/2016. Speech has improved and numbness has resolved. He has not been on Plavix. There is no history of stroke or TIA. CT scan of his head showed no acute intracranial abnormality. MRI showed multiple small areas of left MCA territory acute infarction, likely embolic in of arterial source. MRI angiograms of the head and of the neck were unremarkable. NIH stroke score at the time of this evaluation was 0.  LSN: 07/18/2016 tPA Given: No: Beyond time window for treatment consideration mRankin:   SUBJECTIVE (INTERVAL HISTORY) His wife is at the bedside.  Pt stated that his LE numbness is gone still has some slurry speech. So far stroke work up negative.    OBJECTIVE Temp:  [98.2 F (36.8 C)-98.6 F (37 C)] 98.2 F (36.8 C) (05/20 0600) Pulse Rate:  [63-92] 78 (05/20 0600) Cardiac Rhythm: Normal sinus rhythm (05/19 2000) Resp:  [11-21] 20 (05/19 1819) BP: (148-186)/(89-107) 148/95 (05/20 0600) SpO2:  [99 %-100 %] 100 % (05/20 0600) Weight:  [74.8 kg (165 lb)] 74.8 kg (165 lb) (05/19 1044)  CBC:  Recent Labs Lab 07/19/16 1100 07/19/16 1112  WBC 7.3  --   NEUTROABS 4.8  --   HGB 13.2 13.3  HCT 37.5* 39.0  MCV 93.8  --   PLT 206  --     Basic Metabolic Panel:  Recent Labs Lab 07/19/16 1100 07/19/16 1112  NA 140 141  K 4.0 4.3  CL 102 101  CO2 29  --   GLUCOSE 113* 109*  BUN 5* 7  CREATININE 0.72 0.70  CALCIUM 9.9  --     Lipid Panel:    Component Value Date/Time   CHOL 162 07/20/2016 0427   TRIG 227 (H) 07/20/2016 0427   HDL 49 07/20/2016 0427   CHOLHDL 3.3 07/20/2016 0427   VLDL 45 (H) 07/20/2016 0427   LDLCALC 68 07/20/2016 0427   HgbA1c: No results found for: HGBA1C Urine Drug Screen: No results found for: LABOPIA,  COCAINSCRNUR, LABBENZ, AMPHETMU, THCU, LABBARB  Alcohol Level No results found for: [facility]  IMAGING I have personally reviewed the radiological images below and agree with the radiology interpretations.  Dg Chest 2 View 07/19/2016 No active cardiopulmonary disease.   Ct Head Wo Contrast 07/19/2016 No cause for the patient's symptoms identified. No acute intracranial abnormality.   Mr Angiogram Neck W Contrast 07/19/2016  MRI HEAD:  Acute subcentimeter LEFT MCA territory infarcts most compatible with embolic etiology. Otherwise negative mildly motion degraded noncontrast MRI head.   MRA HEAD:  No emergent large vessel occlusion. Mild LEFT M1 luminal regularity and moderate stenosis LEFT M2 origin secondary to atherosclerosis, thromboembolism or motion artifact.   MRA NECK:  No hemodynamically significant stenosis or acute vascular process.   LE venous doppler - negative  TTE - Left ventricle: The cavity size was normal. Wall thickness was   increased in a pattern of mild LVH. Systolic function was normal.   The estimated ejection fraction was in the range of 55% to 60%.   Wall motion was normal; there were no regional wall motion   abnormalities. There was no evidence of elevated ventricular   filling pressure by Doppler parameters. - Mitral valve: There was mild regurgitation. Impressions: - No cardiac source of emboli was indentified.  TEE  pending  TCD bubble study pending  CT chest / abd / pelvis - pending   PHYSICAL EXAM  Temp:  [98.2 F (36.8 C)-99 F (37.2 C)] 99 F (37.2 C) (05/20 0934) Pulse Rate:  [71-92] 77 (05/20 0934) Resp:  [20] 20 (05/20 0934) BP: (141-163)/(81-102) 141/81 (05/20 0934) SpO2:  [99 %-100 %] 99 % (05/20 0934)  General - Well nourished, well developed, in no apparent distress.  Ophthalmologic - Sharp disc margins OU.   Cardiovascular - Regular rate and rhythm.  Mental Status -  Level of arousal and orientation to time, place, and  person were intact. Language including expression, naming, repetition, comprehension was assessed and found intact. Attention span and concentration were normal. Fund of Knowledge was assessed and was intact.  Cranial Nerves II - XII - II - Visual field intact OU. III, IV, VI - Extraocular movements intact. V - Facial sensation intact bilaterally. VII - mild right facial droop. VIII - Hearing & vestibular intact bilaterally. X - Palate elevates symmetrically. XI - Chin turning & shoulder shrug intact bilaterally. XII - Tongue protrusion intact.  Motor Strength - The patient's strength was normal in all extremities and pronator drift was absent.  Bulk was normal and fasciculations were absent.   Motor Tone - Muscle tone was assessed at the neck and appendages and was normal.  Reflexes - The patient's reflexes were 1+ in all extremities and he had no pathological reflexes.  Sensory - Light touch, temperature/pinprick, vibration and proprioception, and Romberg testing were assessed and were symmetrical.    Coordination - The patient had normal movements in the hands and feet with no ataxia or dysmetria.  Tremor was absent.  Gait and Station - The patient's transfers, posture, gait, station, and turns were observed as normal.   ASSESSMENT/PLAN Mr. Wylie HailJames S Maltz is a 51 y.o. male with history of acid reflux and hypertension presenting with speech difficulties with numbness and tingling of the left extremities. He did not receive IV t-PA due to late presentation.  Stroke:  Acute scattered punctate LEFT MCA territory infarcts, embolic pattern from unclear source.   Resultant  Slurry speech  CT head - No acute intracranial abnormality.   MRI head - Acute subcentimeter LEFT MCA territory infarcts most compatible with embolic etiology.  MRA head and neck unremarkable.  LE venous Dopplers - no DVT  2D Echo - EF 55-60%  TCD bubble study pending  TEE pending  Pan CT pending  LDL  - 68  HgbA1c - pending  UDS - pending  Hypercoagulable work up - pending  VTE prophylaxis - Lovenox  Diet Heart Room service appropriate? Yes; Fluid consistency: Thin  No antithrombotic prior to admission, now on aspirin 325 mg daily. Continue ASA for now  Patient counseled to be compliant with his antithrombotic medications  Ongoing aggressive stroke risk factor management  Therapy recommendations:  pending  Disposition: Pending  Hypertension  Occasional low blood pressures otherwise stable.  Permissive hypertension (OK if < 220/120) but gradually normalize in 5-7 days  Long-term BP goal normotensive  Other Stroke Risk Factors  Former cigarette smoker - quit 7 years ago  ETOH use, advised to drink no more than 1 drink per day.  Other Active Problems  Weight loss, 5-10 lbs over the year  HIV negative    Hospital day # 0  Marvel PlanJindong Tiaja Hagan, MD PhD Stroke Neurology 07/20/2016 5:47 PM    To contact Stroke Continuity provider, please refer to [email or web address]. After hours, contact General  Neurology

## 2016-07-20 NOTE — Progress Notes (Signed)
[facility] PROGRESS NOTE  RAYCE BRAHMBHATT SAY:[id number] DOB: 1965/11/02 DOA: 07/19/2016  PCP: Patient, No Pcp Per  Brief History/Interval Summary: 51 year old Caucasian male with a past medical history of GERD, hypertension, though he is not on any medications for the same. Presented with one-day history of slurred speech, right arm tingling and numbness. Concern was for TIA versus stroke. Patient was hospitalized for further management.  Reason for Visit: Acute embolic stroke  Consultants: Neurology  Procedures:  Transthoracic echocardiogram is pending  Antibiotics: None  Subjective/Interval History: Patient feels well this morning. Speech is still impaired, though much improved. Denies any further testing and numbness in his right arm. Denies any weakness. Denies any history of heart disease.  ROS: Denies any nausea or vomiting  Objective:  Vital Signs  Vitals:   07/20/16 0000 07/20/16 0211 07/20/16 0600 07/20/16 0934  BP: (!) 154/102 (!) 152/90 (!) 148/95 (!) 141/81  Pulse: 92 76 78 77  Resp:    20  Temp:   98.2 F (36.8 C) 99 F (37.2 C)  TempSrc:   Oral Oral  SpO2: 100%  100% 99%  Weight:      Height:        Intake/Output Summary (Last 24 hours) at 07/20/16 1247 Last data filed at 07/20/16 0935  Gross per 24 hour  Intake              120 ml  Output                0 ml  Net              120 ml   Filed Weights   07/19/16 1044  Weight: 74.8 kg (165 lb)    General appearance: alert, cooperative, appears stated age and no distress Resp: clear to auscultation bilaterally Cardio: regular rate and rhythm, S1, S2 normal, no murmur, click, rub or gallop GI: soft, non-tender; bowel sounds normal; no masses,  no organomegaly Extremities: extremities normal, atraumatic, no cyanosis or edema Neurologic: Awake and alert. Oriented 3. Subtle right facial droop. Other cranial nerves normal. Strength equal bilateral upper and lower extremity. Slightly diminished  finger to nose on the right. No obvious pronator drift.  Lab Results:  Data Reviewed: I have personally reviewed following labs and imaging studies  CBC:  Recent Labs Lab 07/19/16 1100 07/19/16 1112  WBC 7.3  --   NEUTROABS 4.8  --   HGB 13.2 13.3  HCT 37.5* 39.0  MCV 93.8  --   PLT 206  --     Basic Metabolic Panel:  Recent Labs Lab 07/19/16 1100 07/19/16 1112  NA 140 141  K 4.0 4.3  CL 102 101  CO2 29  --   GLUCOSE 113* 109*  BUN 5* 7  CREATININE 0.72 0.70  CALCIUM 9.9  --     GFR: Estimated Creatinine Clearance: 106.9 mL/min (by C-G formula based on SCr of 0.7 mg/dL).  Liver Function Tests:  Recent Labs Lab 07/19/16 1100  AST 46*  ALT 47  ALKPHOS 44  BILITOT 0.9  PROT 6.9  ALBUMIN 4.5    Coagulation Profile:  Recent Labs Lab 07/19/16 1100  INR 1.02    Lipid Profile:  Recent Labs  07/20/16 0427  CHOL 162  HDL 49  LDLCALC 68  TRIG 227*  CHOLHDL 3.3     Radiology Studies: Dg Chest 2 View  Result Date: 07/19/2016 CLINICAL DATA:  Blurred vision, slurred speech, right arm numbness since yesterday knee in.  Headaches over the last week. EXAM: CHEST  2 VIEW COMPARISON:  None. FINDINGS: Normal heart size. Lungs clear. No pneumothorax. No pleural effusion. Mid level thoracic compression deformity has a chronic appearance. IMPRESSION: No active cardiopulmonary disease. Electronically Signed   By: Jolaine ClickArthur  Hoss M.D.   On: 07/19/2016 13:06   Ct Head Wo Contrast  Result Date: 07/19/2016 CLINICAL DATA:  Slurred speech and numbness in right arm. EXAM: CT HEAD WITHOUT CONTRAST TECHNIQUE: Contiguous axial images were obtained from the base of the skull through the vertex without intravenous contrast. COMPARISON:  None. FINDINGS: Brain: No evidence of acute infarction, hemorrhage, hydrocephalus, extra-axial collection or mass lesion/mass effect. Vascular: No hyperdense vessel or unexpected calcification. Skull: Normal. Negative for fracture or focal  lesion. Sinuses/Orbits: No acute finding. Other: None. IMPRESSION: No cause for the patient's symptoms identified. No acute intracranial abnormality. Electronically Signed   By: Gerome Samavid  Williams III M.D   On: 07/19/2016 11:38   Mr Angiogram Neck W Contrast  Result Date: 07/19/2016 CLINICAL DATA:  Slurred speech and RIGHT sided numbness and tingling beginning yesterday at noon, word-finding difficulties. Symptoms predominately resolved. History of hypertension. EXAM: MRI HEAD WITHOUT CONTRAST MRA HEAD WITHOUT CONTRAST MRA NECK WITHOUT AND WITH CONTRAST TECHNIQUE: Multiplanar, multiecho pulse sequences of the brain and surrounding structures were obtained without intravenous contrast. Angiographic images of the Circle of Willis were obtained using MRA technique without intravenous contrast. Angiographic images of the neck were obtained using MRA technique without and with intravenous contrast. Carotid stenosis measurements (when applicable) are obtained utilizing NASCET criteria, using the distal internal carotid diameter as the denominator. CONTRAST:  15mL MULTIHANCE GADOBENATE DIMEGLUMINE 529 MG/ML IV SOLN COMPARISON:  CT HEAD Jul 19, 2016 at 1126 hours FINDINGS: MRI HEAD FINDINGS- mildly motion degraded examination. BRAIN: Multiple subcentimeter meter foci of reduced diffusion and FLAIR T2 hyperintense signal LEFT frontal lobe, with low ADC values. Subcentimeter linear reduced diffusion LEFT periventricular caudate tail. No susceptibility artifact to suggest hemorrhage. The ventricles and sulci are normal for patient's age. No suspicious parenchymal signal, masses or mass effect. No abnormal extra-axial fluid collections. VASCULAR: Normal major intracranial vascular flow voids present at skull base. SKULL AND UPPER CERVICAL SPINE: No abnormal sellar expansion. No suspicious calvarial bone marrow signal. Craniocervical junction maintained. SINUSES/ORBITS: LEFT maxillary mucosal retention cysts. Mastoid air cells  are well aerated. The included ocular globes and orbital contents are non-suspicious. OTHER: Patient is edentulous. MRA HEAD FINDINGS- mildly motion degraded examination. ANTERIOR CIRCULATION: Normal flow related enhancement of the included cervical, petrous, cavernous and supraclinoid internal carotid arteries. Patent anterior communicating artery. Normal flow related enhancement of the anterior and middle cerebral arteries, including distal segments. Mild luminal irregularity LEFT M1 segment. Focal moderate stenosis LEFT M2 inferior segment origin. No large vessel occlusion, high-grade stenosis, aneurysm. POSTERIOR CIRCULATION: LEFT vertebral artery is dominant. Basilar artery is patent, with normal flow related enhancement of the main branch vessels. Normal flow related enhancement of the posterior cerebral arteries. No large vessel occlusion, high-grade stenosis, abnormal luminal irregularity, aneurysm. ANATOMIC VARIANTS: None. Source images and MIP images were reviewed. MRA NECK FINDINGS ANTERIOR CIRCULATION: The common carotid arteries are widely patent bilaterally. The carotid bifurcations are patent bilaterally without hemodynamically significant stenosis by NASCET criteria. No flow limiting stenosis or luminal irregularity. POSTERIOR CIRCULATION: Bilateral vertebral arteries are patent to the vertebrobasilar junction. No flow limiting stenosis or luminal irregularity. Source images and MIP images were reviewed. IMPRESSION: MRI HEAD: Acute subcentimeter LEFT MCA territory infarcts most compatible with embolic etiology.  Otherwise negative mildly motion degraded noncontrast MRI head. MRA HEAD: No emergent large vessel occlusion. Mild LEFT M1 luminal regularity and moderate stenosis LEFT M2 origin secondary to atherosclerosis, thromboembolism or motion artifact. MRA NECK: No hemodynamically significant stenosis or acute vascular process. Electronically Signed   By: Awilda Metro M.D.   On: 07/19/2016 16:26     Mr Brain Wo Contrast  Result Date: 07/19/2016 CLINICAL DATA:  Slurred speech and RIGHT sided numbness and tingling beginning yesterday at noon, word-finding difficulties. Symptoms predominately resolved. History of hypertension. EXAM: MRI HEAD WITHOUT CONTRAST MRA HEAD WITHOUT CONTRAST MRA NECK WITHOUT AND WITH CONTRAST TECHNIQUE: Multiplanar, multiecho pulse sequences of the brain and surrounding structures were obtained without intravenous contrast. Angiographic images of the Circle of Willis were obtained using MRA technique without intravenous contrast. Angiographic images of the neck were obtained using MRA technique without and with intravenous contrast. Carotid stenosis measurements (when applicable) are obtained utilizing NASCET criteria, using the distal internal carotid diameter as the denominator. CONTRAST:  15mL MULTIHANCE GADOBENATE DIMEGLUMINE 529 MG/ML IV SOLN COMPARISON:  CT HEAD Jul 19, 2016 at 1126 hours FINDINGS: MRI HEAD FINDINGS- mildly motion degraded examination. BRAIN: Multiple subcentimeter meter foci of reduced diffusion and FLAIR T2 hyperintense signal LEFT frontal lobe, with low ADC values. Subcentimeter linear reduced diffusion LEFT periventricular caudate tail. No susceptibility artifact to suggest hemorrhage. The ventricles and sulci are normal for patient's age. No suspicious parenchymal signal, masses or mass effect. No abnormal extra-axial fluid collections. VASCULAR: Normal major intracranial vascular flow voids present at skull base. SKULL AND UPPER CERVICAL SPINE: No abnormal sellar expansion. No suspicious calvarial bone marrow signal. Craniocervical junction maintained. SINUSES/ORBITS: LEFT maxillary mucosal retention cysts. Mastoid air cells are well aerated. The included ocular globes and orbital contents are non-suspicious. OTHER: Patient is edentulous. MRA HEAD FINDINGS- mildly motion degraded examination. ANTERIOR CIRCULATION: Normal flow related enhancement of the  included cervical, petrous, cavernous and supraclinoid internal carotid arteries. Patent anterior communicating artery. Normal flow related enhancement of the anterior and middle cerebral arteries, including distal segments. Mild luminal irregularity LEFT M1 segment. Focal moderate stenosis LEFT M2 inferior segment origin. No large vessel occlusion, high-grade stenosis, aneurysm. POSTERIOR CIRCULATION: LEFT vertebral artery is dominant. Basilar artery is patent, with normal flow related enhancement of the main branch vessels. Normal flow related enhancement of the posterior cerebral arteries. No large vessel occlusion, high-grade stenosis, abnormal luminal irregularity, aneurysm. ANATOMIC VARIANTS: None. Source images and MIP images were reviewed. MRA NECK FINDINGS ANTERIOR CIRCULATION: The common carotid arteries are widely patent bilaterally. The carotid bifurcations are patent bilaterally without hemodynamically significant stenosis by NASCET criteria. No flow limiting stenosis or luminal irregularity. POSTERIOR CIRCULATION: Bilateral vertebral arteries are patent to the vertebrobasilar junction. No flow limiting stenosis or luminal irregularity. Source images and MIP images were reviewed. IMPRESSION: MRI HEAD: Acute subcentimeter LEFT MCA territory infarcts most compatible with embolic etiology. Otherwise negative mildly motion degraded noncontrast MRI head. MRA HEAD: No emergent large vessel occlusion. Mild LEFT M1 luminal regularity and moderate stenosis LEFT M2 origin secondary to atherosclerosis, thromboembolism or motion artifact. MRA NECK: No hemodynamically significant stenosis or acute vascular process. Electronically Signed   By: Awilda Metro M.D.   On: 07/19/2016 16:26   Mr Maxine Glenn Head/brain ZO Cm  Result Date: 07/19/2016 CLINICAL DATA:  Slurred speech and RIGHT sided numbness and tingling beginning yesterday at noon, word-finding difficulties. Symptoms predominately resolved. History of  hypertension. EXAM: MRI HEAD WITHOUT CONTRAST MRA HEAD WITHOUT CONTRAST MRA NECK WITHOUT  AND WITH CONTRAST TECHNIQUE: Multiplanar, multiecho pulse sequences of the brain and surrounding structures were obtained without intravenous contrast. Angiographic images of the Circle of Willis were obtained using MRA technique without intravenous contrast. Angiographic images of the neck were obtained using MRA technique without and with intravenous contrast. Carotid stenosis measurements (when applicable) are obtained utilizing NASCET criteria, using the distal internal carotid diameter as the denominator. CONTRAST:  15mL MULTIHANCE GADOBENATE DIMEGLUMINE 529 MG/ML IV SOLN COMPARISON:  CT HEAD Jul 19, 2016 at 1126 hours FINDINGS: MRI HEAD FINDINGS- mildly motion degraded examination. BRAIN: Multiple subcentimeter meter foci of reduced diffusion and FLAIR T2 hyperintense signal LEFT frontal lobe, with low ADC values. Subcentimeter linear reduced diffusion LEFT periventricular caudate tail. No susceptibility artifact to suggest hemorrhage. The ventricles and sulci are normal for patient's age. No suspicious parenchymal signal, masses or mass effect. No abnormal extra-axial fluid collections. VASCULAR: Normal major intracranial vascular flow voids present at skull base. SKULL AND UPPER CERVICAL SPINE: No abnormal sellar expansion. No suspicious calvarial bone marrow signal. Craniocervical junction maintained. SINUSES/ORBITS: LEFT maxillary mucosal retention cysts. Mastoid air cells are well aerated. The included ocular globes and orbital contents are non-suspicious. OTHER: Patient is edentulous. MRA HEAD FINDINGS- mildly motion degraded examination. ANTERIOR CIRCULATION: Normal flow related enhancement of the included cervical, petrous, cavernous and supraclinoid internal carotid arteries. Patent anterior communicating artery. Normal flow related enhancement of the anterior and middle cerebral arteries, including distal  segments. Mild luminal irregularity LEFT M1 segment. Focal moderate stenosis LEFT M2 inferior segment origin. No large vessel occlusion, high-grade stenosis, aneurysm. POSTERIOR CIRCULATION: LEFT vertebral artery is dominant. Basilar artery is patent, with normal flow related enhancement of the main branch vessels. Normal flow related enhancement of the posterior cerebral arteries. No large vessel occlusion, high-grade stenosis, abnormal luminal irregularity, aneurysm. ANATOMIC VARIANTS: None. Source images and MIP images were reviewed. MRA NECK FINDINGS ANTERIOR CIRCULATION: The common carotid arteries are widely patent bilaterally. The carotid bifurcations are patent bilaterally without hemodynamically significant stenosis by NASCET criteria. No flow limiting stenosis or luminal irregularity. POSTERIOR CIRCULATION: Bilateral vertebral arteries are patent to the vertebrobasilar junction. No flow limiting stenosis or luminal irregularity. Source images and MIP images were reviewed. IMPRESSION: MRI HEAD: Acute subcentimeter LEFT MCA territory infarcts most compatible with embolic etiology. Otherwise negative mildly motion degraded noncontrast MRI head. MRA HEAD: No emergent large vessel occlusion. Mild LEFT M1 luminal regularity and moderate stenosis LEFT M2 origin secondary to atherosclerosis, thromboembolism or motion artifact. MRA NECK: No hemodynamically significant stenosis or acute vascular process. Electronically Signed   By: Awilda Metro M.D.   On: 07/19/2016 16:26     Medications:  Scheduled: .  stroke: mapping our early stages of recovery book   Does not apply Once  . aspirin EC  325 mg Oral Daily  . atorvastatin  20 mg Oral q1800  . enoxaparin (LOVENOX) injection  40 mg Subcutaneous Q24H  . pantoprazole  40 mg Oral Q0600   Continuous:  [id number] **OR** acetaminophen (TYLENOL) oral liquid 160 mg/5 mL **OR** acetaminophen, hydrALAZINE  Assessment/Plan:  Principal Problem:    Cerebral embolism with cerebral infarction Active Problems:   Hypertension   GERD (gastroesophageal reflux disease)   Malignant hypertension    Acute embolic stroke Stroke workup is in progress. Echocardiogram is pending. No significant carotid stenosis noted on MRA neck. Neurology has been consulted. LDL is 68. HbA1c is pending. PT, OT, speech therapy. Lower extremity venous Doppler. Also, ordered by neurology. Hypercoagulable workup also ordered. Statin  initiated. Aspirin. HIV nonreactive. EKG showed sinus rhythm. Further management per neurology.  History of hypertension. Patient does not take any medications for same. Currently allowing permissive hypertension.  History of GERD. He is on PPI.  DVT Prophylaxis: Lovenox    Code Status: Full code  Family Communication: Discussed with the patient and his wife  Disposition Plan: Management as outlined above.    LOS: 0 days   [facility]  [facility] Pager [phone] 07/20/2016, 12:47 PM  If 7PM-7AM, please contact night-coverage at [email or web address], password [facility]

## 2016-07-21 ENCOUNTER — Encounter (HOSPITAL_COMMUNITY)
Admission: EM | Disposition: A | Discharge status: Home / Self Care | Payer: Self-pay | Source: Home / Self Care | Attending: Internal Medicine

## 2016-07-21 ENCOUNTER — Inpatient Hospital Stay (HOSPITAL_COMMUNITY): Payer: 59

## 2016-07-21 ENCOUNTER — Encounter (HOSPITAL_COMMUNITY): Payer: Self-pay | Admitting: Nurse Practitioner

## 2016-07-21 DIAGNOSIS — I638 Other cerebral infarction: Secondary | ICD-10-CM

## 2016-07-21 DIAGNOSIS — Q2112 Patent foramen ovale: Secondary | ICD-10-CM

## 2016-07-21 DIAGNOSIS — Q211 Atrial septal defect: Secondary | ICD-10-CM

## 2016-07-21 HISTORY — PX: TEE WITHOUT CARDIOVERSION: SHX5443

## 2016-07-21 LAB — CBC
HCT: 35.9 % — ABNORMAL LOW (ref 39.0–52.0)
Hemoglobin: 12.5 g/dL — ABNORMAL LOW (ref 13.0–17.0)
MCH: 32.9 pg (ref 26.0–34.0)
MCHC: 34.8 g/dL (ref 30.0–36.0)
MCV: 94.5 fL (ref 78.0–100.0)
PLATELETS: 183 10*3/uL (ref 150–400)
RBC: 3.8 MIL/uL — AB (ref 4.22–5.81)
RDW: 11.9 % (ref 11.5–15.5)
WBC: 5.7 10*3/uL (ref 4.0–10.5)

## 2016-07-21 LAB — BASIC METABOLIC PANEL
Anion gap: 8 (ref 5–15)
CALCIUM: 9.4 mg/dL (ref 8.9–10.3)
CO2: 29 mmol/L (ref 22–32)
Chloride: 102 mmol/L (ref 101–111)
Creatinine, Ser: 0.7 mg/dL (ref 0.61–1.24)
GFR calc Af Amer: >60 mL/min (ref >60)
Glucose, Bld: 123 mg/dL — ABNORMAL HIGH (ref 65–99)
POTASSIUM: 3.5 mmol/L (ref 3.5–5.1)
Sodium: 139 mmol/L (ref 135–145)

## 2016-07-21 LAB — HEMOGLOBIN A1C
Hgb A1c MFr Bld: 5.6 % (ref 4.8–5.6)
MEAN PLASMA GLUCOSE: 114 mg/dL

## 2016-07-21 SURGERY — ECHOCARDIOGRAM, TRANSESOPHAGEAL
Anesthesia: Moderate Sedation

## 2016-07-21 MED ORDER — FENTANYL CITRATE (PF) 100 MCG/2ML IJ SOLN
INTRAMUSCULAR | Status: DC | PRN
  Administered 2016-07-21: 50 ug via INTRAVENOUS

## 2016-07-21 MED ORDER — BUTAMBEN-TETRACAINE-BENZOCAINE 2-2-14 % EX AERO
INHALATION_SPRAY | CUTANEOUS | Status: DC | PRN
  Administered 2016-07-21: 2 via TOPICAL

## 2016-07-21 MED ORDER — SODIUM CHLORIDE 0.9 % IV SOLN
INTRAVENOUS | Status: DC
  Administered 2016-07-21: 09:00:00 via INTRAVENOUS

## 2016-07-21 MED ORDER — MIDAZOLAM HCL 10 MG/2ML IJ SOLN
INTRAMUSCULAR | Status: DC | PRN
  Administered 2016-07-21 (×2): 2 mg via INTRAVENOUS

## 2016-07-21 MED ORDER — FENTANYL CITRATE (PF) 100 MCG/2ML IJ SOLN
INTRAMUSCULAR | Status: AC
  Filled 2016-07-21: qty 2

## 2016-07-21 MED ORDER — MIDAZOLAM HCL 5 MG/ML IJ SOLN
INTRAMUSCULAR | Status: AC
  Filled 2016-07-21: qty 2

## 2016-07-21 MED ORDER — ASPIRIN 325 MG PO TBEC
325.0000 mg | DELAYED_RELEASE_TABLET | Freq: Every day | ORAL | 3 refills | Status: DC
Start: 2016-07-21 — End: 2016-08-02

## 2016-07-21 MED ORDER — ATORVASTATIN CALCIUM 20 MG PO TABS: 20.0000 mg | ORAL_TABLET | Freq: Every day | ORAL | 1 refills | Status: DC

## 2016-07-21 NOTE — Progress Notes (Signed)
    [facility] has been requested to perform a transesophageal echocardiogram on Quinten S Soderquist for CVA.  After careful review of history and examination, the risks and benefits of transesophageal echocardiogram have been explained including risks of esophageal damage, perforation (1:10,000 risk), bleeding, pharyngeal hematoma as well as other potential complications associated with conscious sedation including aspiration, arrhythmia, respiratory failure and death. Alternatives to treatment were discussed, questions were answered. Patient is willing to proceed.   Renell Coaxum, PA-C  07/21/2016 8:18 AM  

## 2016-07-21 NOTE — Interval H&P Note (Signed)
History and Physical Interval Note:  07/21/2016 8:38 AM  Zachary Woods  has presented today for surgery, with the diagnosis of stroke  The various methods of treatment have been discussed with the patient and family. After consideration of risks, benefits and other options for treatment, the patient has consented to  Procedure(s): TRANSESOPHAGEAL ECHOCARDIOGRAM (TEE) (N/A) as a surgical intervention .  The patient's history has been reviewed, patient examined, no change in status, stable for surgery.  I have reviewed the patient's chart and labs.  Questions were answered to the patient's satisfaction.     Tobias AlexanderKatarina Benz [place]

## 2016-07-21 NOTE — Progress Notes (Signed)
OT Cancellation Note  Patient Details Name: Zachary Woods MRN: [id number] DOB: December 29, 1965   Cancelled Treatment:    Reason Eval/Treat Not Completed: OT screened, no needs identified, will sign off. Pt able to complete all ADL and IADL (including cognitive aspects) at independent PLOF in hospital setting and demonstrates in-tact L UE sensation and coordination.  Doristine Sectionharity A Shakea Isip, MS OTR/L  Pager: [phone]  Doristine SectionCharity A Phylisha Dix 07/21/2016, 8:26 AM

## 2016-07-21 NOTE — Care Management Note (Signed)
Case Management Note  Patient Details  Name: Zachary Woods MRN: [id number] Date of Birth: [birth year]  Subjective/Objective:                    Action/Plan: Pt discharged home with self care. No f/u per PT. Pt does not have PCP. CM provided him [facility] number to assist him in finding a PCP.  Pt has transportation home.   Expected Discharge Date:  07/21/16               Expected Discharge Plan:  Home/Self Care  In-House Referral:     Discharge planning Services  CM Consult  Post Acute Care Choice:    Choice offered to:     DME Arranged:    DME Agency:     HH Arranged:    HH Agency:     Status of Service:  Completed, signed off  If discussed at [employer] Length of Stay Meetings, dates discussed:    Additional Comments:  Kermit BaloKelli F Jacquilyn Seldon, RN 07/21/2016, 7:44 PM

## 2016-07-21 NOTE — Discharge Instructions (Signed)
Transient Ischemic Attack °A transient ischemic attack (TIA) is a "warning stroke" that causes stroke-like symptoms. A TIA does not cause lasting damage to the brain. The symptoms of a TIA can happen fast and do not last long. It is important to know the symptoms of a TIA and what to do. This can help prevent stroke or death. °Follow these instructions at home: °· Take medicines only as told by your doctor. Make sure you understand all of the instructions. °· You may need to take aspirin or warfarin medicine. Warfarin needs to be taken exactly as told. °¨ Taking too much or too little warfarin is dangerous. Blood tests must be done as often as told by your doctor. A PT blood test measures how long it takes for blood to clot. Your PT is used to calculate another value called an INR. Your PT and INR help your doctor adjust your warfarin dosage. He or she will make sure you are taking the right amount. °¨ Food can cause problems with warfarin and affect the results of your blood tests. This is true for foods high in vitamin K. Eat the same amount of foods high in vitamin K each day. Foods high in vitamin K include spinach, kale, broccoli, cabbage, collard and turnip greens, Brussels sprouts, peas, cauliflower, seaweed, and parsley. Other foods high in vitamin K include beef and pork liver, green tea, and soybean oil. Eat the same amount of foods high in vitamin K each day. Avoid big changes in your diet. Tell your doctor before changing your diet. Talk to a food specialist (dietitian) if you have questions. °¨ Many medicines can cause problems with warfarin and affect your PT and INR. Tell your doctor about all medicines you take. This includes vitamins and dietary pills (supplements). Do not take or stop taking any prescribed or over-the-counter medicines unless your doctor tells you to. °¨ Warfarin can cause more bruising or bleeding. Hold pressure over any cuts for longer than normal. Talk to your doctor about other  side effects of warfarin. °¨ Avoid sports or activities that may cause injury or bleeding. °¨ Be careful when you shave, floss, or use sharp objects. °¨ Avoid or drink very little alcohol while taking warfarin. Tell your doctor if you change how much alcohol you drink. °¨ Tell your dentist and other doctors that you take warfarin before any procedures. °· Follow your diet program as told, if you are given one. °· Keep a healthy weight. °· Stay active. Try to get at least 30 minutes of activity on all or most days. °· Do not use any tobacco products, including cigarettes, chewing tobacco, or electronic cigarettes. If you need help quitting, ask your doctor. °· Limit alcohol intake to no more than 1 drink per day for nonpregnant women and 2 drinks per day for men. One drink equals 12 ounces of beer, 5 ounces of wine, or 1½ ounces of hard liquor. °· Do not abuse drugs. °· Keep your home safe so you do not fall. You can do this by: °¨ Putting grab bars in the bedroom and bathroom. °¨ Raising toilet seats. °¨ Putting a seat in the shower. °· Keep all follow-up visits as told by your doctor. This is important. °Contact a doctor if: °· Your personality changes. °· You have trouble swallowing. °· You have double vision. °· You are dizzy. °· You have a fever. °Get help right away if: °These symptoms may be an emergency. Do not wait to see if   the symptoms will go away. Get medical help right away. Call your local emergency services (911 in the [place] Do not drive yourself to the hospital. °· You have sudden weakness or lose feeling (go numb), especially on one side of the body. This can affect your: °¨ Face. °¨ Arm. °¨ Leg. °· You have sudden trouble walking. °· You have sudden trouble moving your arms or legs. °· You have sudden confusion. °· You have trouble talking. °· You have trouble understanding. °· You have sudden trouble seeing in one or both eyes. °· You lose your balance. °· Your movements are not smooth. °· You  have a sudden, very bad headache with no known cause. °· You have new chest pain. °· Your heartbeat is unsteady. °· You are partly or totally unaware of what is going on around you. °This information is not intended to replace advice given to you by your health care provider. Make sure you discuss any questions you have with your health care provider. °Document Released: 11/27/2007 Document Revised: 10/22/2015 Document Reviewed: 05/25/2013 °Elsevier Interactive Patient Education © 2017 [employer] ° °

## 2016-07-21 NOTE — Progress Notes (Signed)
Patient given discharge instructions.  All questions and concerns addressed. Patient left unit by wheelchair with all belongings.  

## 2016-07-21 NOTE — CV Procedure (Signed)
   Transesophageal Echocardiogram Note  Zachary HailJames S Woods [id number] 08-15-1965  Procedure: Transesophageal Echocardiogram Indications: CVA  Procedure Details Consent: Obtained Time Out: Verified patient identification, verified procedure, site/side was marked, verified correct patient position, special equipment/implants available, Radiology Safety Procedures followed,  medications/allergies/relevent history reviewed, required imaging and test results available.  Performed  Medications: During this procedure the patient is administered a total of Versed 6 mg and Fentanyl 50 mg to achieve and maintain moderate conscious sedation.  The patient's heart rate, blood pressure, and oxygen saturation are monitored continuously during the procedure. The period of conscious sedation is 30 minutes, of which I was present face-to-face 100% of this time.  Left Ventrical:  LVEF 55-60%.   Mitral Valve: Trivial MR  Aortic Valve: normal  Tricuspid Valve: normal  Pulmonic Valve: normal  Left Atrium/ Left atrial appendage: no thrombus  Atrial septum: positive PFO by a bubble study  Aorta: mild plaque   Complications: No apparent complications Patient did tolerate procedure well.  Tobias AlexanderKatarina Ferrin Liebig, MD, [facility] 07/21/2016, 10:44 AM

## 2016-07-21 NOTE — H&P (View-Only) (Signed)
    [facility] has been requested to perform a transesophageal echocardiogram on Zachary Woods for CVA.  After careful review of history and examination, the risks and benefits of transesophageal echocardiogram have been explained including risks of esophageal damage, perforation (1:10,000 risk), bleeding, pharyngeal hematoma as well as other potential complications associated with conscious sedation including aspiration, arrhythmia, respiratory failure and death. Alternatives to treatment were discussed, questions were answered. Patient is willing to proceed.   Cline CrockKathryn Adean Milosevic, PA-C  07/21/2016 8:18 AM

## 2016-07-21 NOTE — Discharge Summary (Signed)
[facility]  Physician Discharge Summary   Patient ID: OLIVE MOTYKA MRN: [id number] DOB/AGE: Nov 22, 1965 51 y.o.  Admit date: 07/19/2016 Discharge date: 07/21/2016  PCP: Patient, No Pcp Per  DISCHARGE DIAGNOSES:  Principal Problem:   Cerebral embolism with cerebral infarction Active Problems:   Hypertension   GERD (gastroesophageal reflux disease)   Malignant hypertension   Stroke determined by clinical assessment (HCC)   PFO (patent foramen ovale)   RECOMMENDATIONS FOR OUTPATIENT FOLLOW UP: 1. Patient asked to establish himself with a primary care physician 2. Neurology to refer him to cardiology for PFO closure 3. Will need CT scan of the abdomen and pelvis repeated in 3 months. See below   DISCHARGE CONDITION: fair  Diet recommendation: Heart healthy  Filed Weights   07/19/16 1044 07/21/16 0920  Weight: 74.8 kg (165 lb) 74.8 kg (165 lb)    INITIAL HISTORY:  51 year old Caucasian male with a past medical history of GERD, hypertension, though he is not on any medications for the same. Presented with one-day history of slurred speech, right arm tingling and numbness. Concern was for TIA versus stroke. Patient was hospitalized for further management.  Consultants: Neurology  Procedures:  Transthoracic echocardiogram Study Conclusions  - Left ventricle: The cavity size was normal. Wall thickness was increased in a pattern of mild LVH. Systolic function was normal. The estimated ejection fraction was in the range of 55% to 60%. Wall motion was normal; there were no regional wall motion abnormalities. There was no evidence of elevated ventricular filling pressure by Doppler parameters. - Mitral valve: There was mild regurgitation.  Impressions: - No cardiac source of emboli was indentified.  TEE Study Conclusions  - Left ventricle: There was mild concentric hypertrophy. Systolic function was normal. The estimated ejection fraction was  in the range of 55% to 60%. Wall motion was normal; there were no regional wall motion abnormalities. - Aortic valve: No evidence of vegetation. - Left atrium: No evidence of thrombus in the atrial cavity or appendage. No evidence of thrombus in the atrial cavity or appendage. - Right atrium: No evidence of thrombus in the atrial cavity or appendage. - Atrial septum: There was a patent foramen ovale. - Tricuspid valve: No evidence of vegetation. - Pulmonic valve: No evidence of vegetation.  Impressions:  - Positive bubble study consistent with present PFO. No thrombus in the left atrium or lefta trial appendage.  Lower extremity venous Doppler Bilateral:  No evidence of DVT, superficial thrombosis, or Baker's Cyst.  Transcranial Dopplers Transcranial Doppler with Bubbles has been completed with Dr. Roda Shutters. There was no evidence of high intensity transient signals (HITS) at rest. HITS were heard at rest, suggestive of a small patent foramen ovale (PFO).  HOSPITAL COURSE:   Acute embolic stroke Patient seen by neurology. TEE has been done, which showed PFO. No embolic source identified. Lower extremity venous Doppler negative for DVT. Urine drug screen positive for THC. CT scan of the abdomen and pelvis did not show any obvious malignancy. LDL is 68. He is on a statin. HbA1c 5.6. Hypercoagulable workup is pending. HIV nonreactive. EKG showed sinus rhythm. Transcranial Doppler is as above. Discussed with neurology. No need for loop recorder. Recommendation is to discharge on aspirin. They will arrange follow-up with cardiology for PFO closure.   History of hypertension. Patient does not take any medications for same. Blood pressure is reasonably well controlled. Will not start any medications to avoid significant lowering of blood pressure. He should establish with primary care physician  and have this monitored as outpatient.  History of GERD. He is on PPI.  Small liver  lesion Small liver lesion identified on CT scan and suspected to be a fatty infiltration. Patient does drink alcohol regularly and up to 3-4 drinks a night. His last consumption, however, was 1 week ago. He's been asked to refrain from further use and was told that he will need to have a repeat CT scan in 3 months.   He will also need to establish with a primary care provider. He will need a colonoscopy eventually as he is 51 years of age.  Overall, stable. Okay for discharge today.   PERTINENT LABS:  The results of significant diagnostics from this hospitalization (including imaging, microbiology, ancillary and laboratory) are listed below for reference.     Labs: Basic Metabolic Panel:  Recent Labs Lab 07/19/16 1100 07/19/16 1112 07/21/16 0443  NA 140 141 139  K 4.0 4.3 3.5  CL 102 101 102  CO2 29  --  29  GLUCOSE 113* 109* 123*  BUN 5* 7 <5*  CREATININE 0.72 0.70 0.70  CALCIUM 9.9  --  9.4   Liver Function Tests:  Recent Labs Lab 07/19/16 1100  AST 46*  ALT 47  ALKPHOS 44  BILITOT 0.9  PROT 6.9  ALBUMIN 4.5   CBC:  Recent Labs Lab 07/19/16 1100 07/19/16 1112 07/21/16 0443  WBC 7.3  --  5.7  NEUTROABS 4.8  --   --   HGB 13.2 13.3 12.5*  HCT 37.5* 39.0 35.9*  MCV 93.8  --  94.5  PLT 206  --  183     IMAGING STUDIES Dg Chest 2 View  Result Date: 07/19/2016 CLINICAL DATA:  Blurred vision, slurred speech, right arm numbness since yesterday knee in. Headaches over the last week. EXAM: CHEST  2 VIEW COMPARISON:  None. FINDINGS: Normal heart size. Lungs clear. No pneumothorax. No pleural effusion. Mid level thoracic compression deformity has a chronic appearance. IMPRESSION: No active cardiopulmonary disease. Electronically Signed   By: Jolaine Click M.D.   On: 07/19/2016 13:06   Ct Head Wo Contrast  Result Date: 07/19/2016 CLINICAL DATA:  Slurred speech and numbness in right arm. EXAM: CT HEAD WITHOUT CONTRAST TECHNIQUE: Contiguous axial images were  obtained from the base of the skull through the vertex without intravenous contrast. COMPARISON:  None. FINDINGS: Brain: No evidence of acute infarction, hemorrhage, hydrocephalus, extra-axial collection or mass lesion/mass effect. Vascular: No hyperdense vessel or unexpected calcification. Skull: Normal. Negative for fracture or focal lesion. Sinuses/Orbits: No acute finding. Other: None. IMPRESSION: No cause for the patient's symptoms identified. No acute intracranial abnormality. Electronically Signed   By: Gerome Sam III M.D   On: 07/19/2016 11:38   Ct Chest W Contrast  Result Date: 07/20/2016 CLINICAL DATA:  Weight loss. EXAM: CT CHEST, ABDOMEN, AND PELVIS WITH CONTRAST TECHNIQUE: Multidetector CT imaging of the chest, abdomen and pelvis was performed following the standard protocol during bolus administration of intravenous contrast. CONTRAST:  ISOVUE-300 IOPAMIDOL (ISOVUE-300) INJECTION 61% COMPARISON:  None. FINDINGS: CT CHEST FINDINGS Cardiovascular: Normal caliber thoracic aorta with mild atherosclerosis. The heart is normal in size. There are coronary artery calcifications. No pericardial effusion. Mediastinum/Nodes: No mediastinal, hilar, or axillary adenopathy. The esophagus is decompressed. Visualized thyroid gland is normal. Lungs/Pleura: Clear lungs. No consolidation, pulmonary edema or pleural fluid. No pulmonary mass or suspicious nodule. Musculoskeletal: There are no acute or suspicious osseous abnormalities. Degenerative change in the spine. CT ABDOMEN PELVIS FINDINGS  Hepatobiliary: Equivocal 10 mm hypodensity in the left lobe of the liver, may simply be focal fatty infiltration Gallbladder physiologically distended, no calcified stone. No biliary dilatation. Pancreas: No ductal dilatation or inflammation. Spleen: Normal in size without focal abnormality. Adrenals/Urinary Tract: No adrenal nodule. No renal mass. No hydronephrosis or perinephric edema. Homogeneous enhancement and  symmetric excretion on delayed phase imaging. Stomach/Bowel: Stomach is within normal limits. Appendix appears normal. No evidence of bowel wall thickening, distention, or inflammatory changes. Colonic diverticulosis is most prominent in the distal descending and sigmoid colon, no acute inflammation. Vascular/Lymphatic: Aortic atherosclerosis. No enlarged abdominal or pelvic lymph nodes. Reproductive: Prostate is unremarkable. Other: No free air, free fluid, or intra-abdominal fluid collection. Scattered air in the subcutaneous tissues of the lower anterior abdominal wall likely secondary to injections. Musculoskeletal: There are no acute or suspicious osseous abnormalities. Degenerative change in the spine. IMPRESSION: 1. No explanation for weight loss. No evidence of malignancy in the chest, abdomen, or pelvis. 2. Questionable 10 mm hypodense lesion in the left lobe of the liver may simply be focal fatty infiltration. In a low risk patient with no known malignancy, hepatic dysfunction, hepatic risk factors or symptoms attributable to the liver, no further follow-up is needed. 3. Thoracoabdominal aortic atherosclerosis. Scattered coronary artery calcifications. Electronically Signed   By: Rubye Oaks M.D.   On: 07/20/2016 22:00   Mr Angiogram Neck W Contrast  Result Date: 07/19/2016 CLINICAL DATA:  Slurred speech and RIGHT sided numbness and tingling beginning yesterday at noon, word-finding difficulties. Symptoms predominately resolved. History of hypertension. EXAM: MRI HEAD WITHOUT CONTRAST MRA HEAD WITHOUT CONTRAST MRA NECK WITHOUT AND WITH CONTRAST TECHNIQUE: Multiplanar, multiecho pulse sequences of the brain and surrounding structures were obtained without intravenous contrast. Angiographic images of the Circle of Willis were obtained using MRA technique without intravenous contrast. Angiographic images of the neck were obtained using MRA technique without and with intravenous contrast. Carotid  stenosis measurements (when applicable) are obtained utilizing NASCET criteria, using the distal internal carotid diameter as the denominator. CONTRAST:  15mL MULTIHANCE GADOBENATE DIMEGLUMINE 529 MG/ML IV SOLN COMPARISON:  CT HEAD Jul 19, 2016 at 1126 hours FINDINGS: MRI HEAD FINDINGS- mildly motion degraded examination. BRAIN: Multiple subcentimeter meter foci of reduced diffusion and FLAIR T2 hyperintense signal LEFT frontal lobe, with low ADC values. Subcentimeter linear reduced diffusion LEFT periventricular caudate tail. No susceptibility artifact to suggest hemorrhage. The ventricles and sulci are normal for patient's age. No suspicious parenchymal signal, masses or mass effect. No abnormal extra-axial fluid collections. VASCULAR: Normal major intracranial vascular flow voids present at skull base. SKULL AND UPPER CERVICAL SPINE: No abnormal sellar expansion. No suspicious calvarial bone marrow signal. Craniocervical junction maintained. SINUSES/ORBITS: LEFT maxillary mucosal retention cysts. Mastoid air cells are well aerated. The included ocular globes and orbital contents are non-suspicious. OTHER: Patient is edentulous. MRA HEAD FINDINGS- mildly motion degraded examination. ANTERIOR CIRCULATION: Normal flow related enhancement of the included cervical, petrous, cavernous and supraclinoid internal carotid arteries. Patent anterior communicating artery. Normal flow related enhancement of the anterior and middle cerebral arteries, including distal segments. Mild luminal irregularity LEFT M1 segment. Focal moderate stenosis LEFT M2 inferior segment origin. No large vessel occlusion, high-grade stenosis, aneurysm. POSTERIOR CIRCULATION: LEFT vertebral artery is dominant. Basilar artery is patent, with normal flow related enhancement of the main branch vessels. Normal flow related enhancement of the posterior cerebral arteries. No large vessel occlusion, high-grade stenosis, abnormal luminal irregularity,  aneurysm. ANATOMIC VARIANTS: None. Source images and MIP images were  reviewed. MRA NECK FINDINGS ANTERIOR CIRCULATION: The common carotid arteries are widely patent bilaterally. The carotid bifurcations are patent bilaterally without hemodynamically significant stenosis by NASCET criteria. No flow limiting stenosis or luminal irregularity. POSTERIOR CIRCULATION: Bilateral vertebral arteries are patent to the vertebrobasilar junction. No flow limiting stenosis or luminal irregularity. Source images and MIP images were reviewed. IMPRESSION: MRI HEAD: Acute subcentimeter LEFT MCA territory infarcts most compatible with embolic etiology. Otherwise negative mildly motion degraded noncontrast MRI head. MRA HEAD: No emergent large vessel occlusion. Mild LEFT M1 luminal regularity and moderate stenosis LEFT M2 origin secondary to atherosclerosis, thromboembolism or motion artifact. MRA NECK: No hemodynamically significant stenosis or acute vascular process. Electronically Signed   By: Awilda Metro M.D.   On: 07/19/2016 16:26   Mr Brain Wo Contrast  Result Date: 07/19/2016 CLINICAL DATA:  Slurred speech and RIGHT sided numbness and tingling beginning yesterday at noon, word-finding difficulties. Symptoms predominately resolved. History of hypertension. EXAM: MRI HEAD WITHOUT CONTRAST MRA HEAD WITHOUT CONTRAST MRA NECK WITHOUT AND WITH CONTRAST TECHNIQUE: Multiplanar, multiecho pulse sequences of the brain and surrounding structures were obtained without intravenous contrast. Angiographic images of the Circle of Willis were obtained using MRA technique without intravenous contrast. Angiographic images of the neck were obtained using MRA technique without and with intravenous contrast. Carotid stenosis measurements (when applicable) are obtained utilizing NASCET criteria, using the distal internal carotid diameter as the denominator. CONTRAST:  15mL MULTIHANCE GADOBENATE DIMEGLUMINE 529 MG/ML IV SOLN COMPARISON:  CT  HEAD Jul 19, 2016 at 1126 hours FINDINGS: MRI HEAD FINDINGS- mildly motion degraded examination. BRAIN: Multiple subcentimeter meter foci of reduced diffusion and FLAIR T2 hyperintense signal LEFT frontal lobe, with low ADC values. Subcentimeter linear reduced diffusion LEFT periventricular caudate tail. No susceptibility artifact to suggest hemorrhage. The ventricles and sulci are normal for patient's age. No suspicious parenchymal signal, masses or mass effect. No abnormal extra-axial fluid collections. VASCULAR: Normal major intracranial vascular flow voids present at skull base. SKULL AND UPPER CERVICAL SPINE: No abnormal sellar expansion. No suspicious calvarial bone marrow signal. Craniocervical junction maintained. SINUSES/ORBITS: LEFT maxillary mucosal retention cysts. Mastoid air cells are well aerated. The included ocular globes and orbital contents are non-suspicious. OTHER: Patient is edentulous. MRA HEAD FINDINGS- mildly motion degraded examination. ANTERIOR CIRCULATION: Normal flow related enhancement of the included cervical, petrous, cavernous and supraclinoid internal carotid arteries. Patent anterior communicating artery. Normal flow related enhancement of the anterior and middle cerebral arteries, including distal segments. Mild luminal irregularity LEFT M1 segment. Focal moderate stenosis LEFT M2 inferior segment origin. No large vessel occlusion, high-grade stenosis, aneurysm. POSTERIOR CIRCULATION: LEFT vertebral artery is dominant. Basilar artery is patent, with normal flow related enhancement of the main branch vessels. Normal flow related enhancement of the posterior cerebral arteries. No large vessel occlusion, high-grade stenosis, abnormal luminal irregularity, aneurysm. ANATOMIC VARIANTS: None. Source images and MIP images were reviewed. MRA NECK FINDINGS ANTERIOR CIRCULATION: The common carotid arteries are widely patent bilaterally. The carotid bifurcations are patent bilaterally  without hemodynamically significant stenosis by NASCET criteria. No flow limiting stenosis or luminal irregularity. POSTERIOR CIRCULATION: Bilateral vertebral arteries are patent to the vertebrobasilar junction. No flow limiting stenosis or luminal irregularity. Source images and MIP images were reviewed. IMPRESSION: MRI HEAD: Acute subcentimeter LEFT MCA territory infarcts most compatible with embolic etiology. Otherwise negative mildly motion degraded noncontrast MRI head. MRA HEAD: No emergent large vessel occlusion. Mild LEFT M1 luminal regularity and moderate stenosis LEFT M2 origin secondary to atherosclerosis, thromboembolism or motion  artifact. MRA NECK: No hemodynamically significant stenosis or acute vascular process. Electronically Signed   By: Awilda Metro M.D.   On: 07/19/2016 16:26   Ct Abdomen Pelvis W Contrast  Result Date: 07/20/2016 CLINICAL DATA:  Weight loss. EXAM: CT CHEST, ABDOMEN, AND PELVIS WITH CONTRAST TECHNIQUE: Multidetector CT imaging of the chest, abdomen and pelvis was performed following the standard protocol during bolus administration of intravenous contrast. CONTRAST:  ISOVUE-300 IOPAMIDOL (ISOVUE-300) INJECTION 61% COMPARISON:  None. FINDINGS: CT CHEST FINDINGS Cardiovascular: Normal caliber thoracic aorta with mild atherosclerosis. The heart is normal in size. There are coronary artery calcifications. No pericardial effusion. Mediastinum/Nodes: No mediastinal, hilar, or axillary adenopathy. The esophagus is decompressed. Visualized thyroid gland is normal. Lungs/Pleura: Clear lungs. No consolidation, pulmonary edema or pleural fluid. No pulmonary mass or suspicious nodule. Musculoskeletal: There are no acute or suspicious osseous abnormalities. Degenerative change in the spine. CT ABDOMEN PELVIS FINDINGS Hepatobiliary: Equivocal 10 mm hypodensity in the left lobe of the liver, may simply be focal fatty infiltration Gallbladder physiologically distended, no  calcified stone. No biliary dilatation. Pancreas: No ductal dilatation or inflammation. Spleen: Normal in size without focal abnormality. Adrenals/Urinary Tract: No adrenal nodule. No renal mass. No hydronephrosis or perinephric edema. Homogeneous enhancement and symmetric excretion on delayed phase imaging. Stomach/Bowel: Stomach is within normal limits. Appendix appears normal. No evidence of bowel wall thickening, distention, or inflammatory changes. Colonic diverticulosis is most prominent in the distal descending and sigmoid colon, no acute inflammation. Vascular/Lymphatic: Aortic atherosclerosis. No enlarged abdominal or pelvic lymph nodes. Reproductive: Prostate is unremarkable. Other: No free air, free fluid, or intra-abdominal fluid collection. Scattered air in the subcutaneous tissues of the lower anterior abdominal wall likely secondary to injections. Musculoskeletal: There are no acute or suspicious osseous abnormalities. Degenerative change in the spine. IMPRESSION: 1. No explanation for weight loss. No evidence of malignancy in the chest, abdomen, or pelvis. 2. Questionable 10 mm hypodense lesion in the left lobe of the liver may simply be focal fatty infiltration. In a low risk patient with no known malignancy, hepatic dysfunction, hepatic risk factors or symptoms attributable to the liver, no further follow-up is needed. 3. Thoracoabdominal aortic atherosclerosis. Scattered coronary artery calcifications. Electronically Signed   By: Rubye Oaks M.D.   On: 07/20/2016 22:00   Mr Maxine Glenn Head/brain ZO Cm  Result Date: 07/19/2016 CLINICAL DATA:  Slurred speech and RIGHT sided numbness and tingling beginning yesterday at noon, word-finding difficulties. Symptoms predominately resolved. History of hypertension. EXAM: MRI HEAD WITHOUT CONTRAST MRA HEAD WITHOUT CONTRAST MRA NECK WITHOUT AND WITH CONTRAST TECHNIQUE: Multiplanar, multiecho pulse sequences of the brain and surrounding structures were  obtained without intravenous contrast. Angiographic images of the Circle of Willis were obtained using MRA technique without intravenous contrast. Angiographic images of the neck were obtained using MRA technique without and with intravenous contrast. Carotid stenosis measurements (when applicable) are obtained utilizing NASCET criteria, using the distal internal carotid diameter as the denominator. CONTRAST:  15mL MULTIHANCE GADOBENATE DIMEGLUMINE 529 MG/ML IV SOLN COMPARISON:  CT HEAD Jul 19, 2016 at 1126 hours FINDINGS: MRI HEAD FINDINGS- mildly motion degraded examination. BRAIN: Multiple subcentimeter meter foci of reduced diffusion and FLAIR T2 hyperintense signal LEFT frontal lobe, with low ADC values. Subcentimeter linear reduced diffusion LEFT periventricular caudate tail. No susceptibility artifact to suggest hemorrhage. The ventricles and sulci are normal for patient's age. No suspicious parenchymal signal, masses or mass effect. No abnormal extra-axial fluid collections. VASCULAR: Normal major intracranial vascular flow voids present at  skull base. SKULL AND UPPER CERVICAL SPINE: No abnormal sellar expansion. No suspicious calvarial bone marrow signal. Craniocervical junction maintained. SINUSES/ORBITS: LEFT maxillary mucosal retention cysts. Mastoid air cells are well aerated. The included ocular globes and orbital contents are non-suspicious. OTHER: Patient is edentulous. MRA HEAD FINDINGS- mildly motion degraded examination. ANTERIOR CIRCULATION: Normal flow related enhancement of the included cervical, petrous, cavernous and supraclinoid internal carotid arteries. Patent anterior communicating artery. Normal flow related enhancement of the anterior and middle cerebral arteries, including distal segments. Mild luminal irregularity LEFT M1 segment. Focal moderate stenosis LEFT M2 inferior segment origin. No large vessel occlusion, high-grade stenosis, aneurysm. POSTERIOR CIRCULATION: LEFT vertebral  artery is dominant. Basilar artery is patent, with normal flow related enhancement of the main branch vessels. Normal flow related enhancement of the posterior cerebral arteries. No large vessel occlusion, high-grade stenosis, abnormal luminal irregularity, aneurysm. ANATOMIC VARIANTS: None. Source images and MIP images were reviewed. MRA NECK FINDINGS ANTERIOR CIRCULATION: The common carotid arteries are widely patent bilaterally. The carotid bifurcations are patent bilaterally without hemodynamically significant stenosis by NASCET criteria. No flow limiting stenosis or luminal irregularity. POSTERIOR CIRCULATION: Bilateral vertebral arteries are patent to the vertebrobasilar junction. No flow limiting stenosis or luminal irregularity. Source images and MIP images were reviewed. IMPRESSION: MRI HEAD: Acute subcentimeter LEFT MCA territory infarcts most compatible with embolic etiology. Otherwise negative mildly motion degraded noncontrast MRI head. MRA HEAD: No emergent large vessel occlusion. Mild LEFT M1 luminal regularity and moderate stenosis LEFT M2 origin secondary to atherosclerosis, thromboembolism or motion artifact. MRA NECK: No hemodynamically significant stenosis or acute vascular process. Electronically Signed   By: Awilda Metroourtnay  Bloomer M.D.   On: 07/19/2016 16:26    DISCHARGE EXAMINATION: See progress note from earlier today. No changes.  DISPOSITION: Home  Discharge Instructions    Ambulatory referral to Neurology    Complete by:  As directed    [facility] Thanks.   Call MD for:  difficulty breathing, headache or visual disturbances    Complete by:  As directed    Call MD for:  extreme fatigue    Complete by:  As directed    Call MD for:  persistant dizziness or light-headedness    Complete by:  As directed    Call MD for:  persistant nausea and vomiting    Complete by:  As directed    Call MD for:  severe uncontrolled pain    Complete by:   As directed    Call MD for:  temperature >100.4    Complete by:  As directed    Diet - low sodium heart healthy    Complete by:  As directed    Discharge instructions    Complete by:  As directed    Please establish with a PCP. You will need to have a colonoscopy for screening purposes. Please stay off of alcohol. Take your medications as prescribed. You will need to undergo repeat CT scan of abdomen in 3 months to see if there is a change in the spot in your liver.  You were cared for by a hospitalist during your hospital stay. If you have any questions about your discharge medications or the care you received while you were in the hospital after you are discharged, you can call the unit and asked to speak with the hospitalist on call if the hospitalist that took care of you is not available. Once you are discharged, your  primary care physician will handle any further medical issues. Please note that NO REFILLS for any discharge medications will be authorized once you are discharged, as it is imperative that you return to your primary care physician (or establish a relationship with a primary care physician if you do not have one) for your aftercare needs so that they can reassess your need for medications and monitor your lab values. If you do not have a primary care physician, you can call [phone] for a physician referral.   Increase activity slowly    Complete by:  As directed       ALLERGIES:  Allergies  Allergen Reactions  . Penicillins Itching    Current Discharge Medication List    START taking these medications   Details  aspirin EC 325 MG EC tablet Take 1 tablet (325 mg total) by mouth daily. Qty: 30 tablet, Refills: 3    atorvastatin (LIPITOR) 20 MG tablet Take 1 tablet (20 mg total) by mouth daily at 6 PM. Qty: 30 tablet, Refills: 1      CONTINUE these medications which have NOT CHANGED   Details  Multiple Vitamin (MULTI-VITAMIN PO) Take 1 tablet by mouth daily.      omeprazole (PRILOSEC) 20 MG capsule Take 20 mg by mouth daily.    oxymetazoline (AFRIN) 0.05 % nasal spray Place 1 spray into both nostrils 2 (two) times daily as needed for congestion.    Tetrahydrozoline HCl (VISINE OP) Apply 2 drops to eye daily as needed.      STOP taking these medications     ibuprofen (ADVIL,MOTRIN) 200 MG tablet          Follow-up Information    Marvel Plan, MD. Schedule an appointment as soon as possible for a visit in 8 week(s).   Specialty:  Neurology Why:  office will call you for appointment Contact information: [address] [place] [address] [phone]           TOTAL DISCHARGE TIME: 35 mins  [facility]  [facility] Pager [phone]  07/21/2016, 4:35 PM

## 2016-07-21 NOTE — Progress Notes (Signed)
[facility] PROGRESS NOTE  Zachary Woods AVW:[id number] DOB: [birth year] DOA: 07/19/2016  PCP: Patient, No Pcp Per  Brief History/Interval Summary: 51 year old Caucasian male with a past medical history of GERD, hypertension, though he is not on any medications for the same. Presented with one-day history of slurred speech, right arm tingling and numbness. Concern was for TIA versus stroke. Patient was hospitalized for further management.  Reason for Visit: Acute embolic stroke  Consultants: Neurology  Procedures:  Transthoracic echocardiogram Study Conclusions  - Left ventricle: The cavity size was normal. Wall thickness was   increased in a pattern of mild LVH. Systolic function was normal.   The estimated ejection fraction was in the range of 55% to 60%.   Wall motion was normal; there were no regional wall motion   abnormalities. There was no evidence of elevated ventricular   filling pressure by Doppler parameters. - Mitral valve: There was mild regurgitation.  Impressions: - No cardiac source of emboli was indentified.  TEE Study Conclusions  - Left ventricle: There was mild concentric hypertrophy. Systolic   function was normal. The estimated ejection fraction was in the   range of 55% to 60%. Wall motion was normal; there were no   regional wall motion abnormalities. - Aortic valve: No evidence of vegetation. - Left atrium: No evidence of thrombus in the atrial cavity or   appendage. No evidence of thrombus in the atrial cavity or   appendage. - Right atrium: No evidence of thrombus in the atrial cavity or   appendage. - Atrial septum: There was a patent foramen ovale. - Tricuspid valve: No evidence of vegetation. - Pulmonic valve: No evidence of vegetation.  Impressions:  - Positive bubble study consistent with present PFO.   No thrombus in the left atrium or lefta trial appendage.  Antibiotics: None  Subjective/Interval History: Patient  feels well. Speech is still mildly impaired, much improved. Denies any tingling or numbness in his arms or legs. No weakness. Wants to go home. His wife is at the bedside.   ROS: Denies any nausea, vomiting  Objective:  Vital Signs  Vitals:   07/20/16 1819 07/20/16 2100 07/21/16 0100 07/21/16 0500  BP: 135/85 139/90 (!) 130/94 138/84  Pulse: 85 77 74 81  Resp: 20 18 18 17   Temp: 98.7 F (37.1 C) 98.5 F (36.9 C) 98.7 F (37.1 C) 98.8 F (37.1 C)  TempSrc: Oral Oral Oral Oral  SpO2: 99% 97% 98% 97%  Weight:      Height:        Intake/Output Summary (Last 24 hours) at 07/21/16 0916 Last data filed at 07/20/16 1820  Gross per 24 hour  Intake              360 ml  Output                0 ml  Net              360 ml   Filed Weights   07/19/16 1044  Weight: 74.8 kg (165 lb)    General appearance:Awake, alert. No distress Resp: Clear to auscultation bilaterally. Cardio: S1, S2 is normal, regular. No S3, S4. No rubs, murmurs, bruits GI: Soft. Nontender. Nondistended. No masses or organomegaly. Bowel sounds are present Extremities: No edema Neurologic: Oriented 3. Subtle right facial droop. Otherwise no changes. Normal strength.   Lab Results:  Data Reviewed: I have personally reviewed following labs and imaging studies  CBC:  Recent Labs Lab  07/19/16 1100 07/19/16 1112 07/21/16 0443  WBC 7.3  --  5.7  NEUTROABS 4.8  --   --   HGB 13.2 13.3 12.5*  HCT 37.5* 39.0 35.9*  MCV 93.8  --  94.5  PLT 206  --  183    Basic Metabolic Panel:  Recent Labs Lab 07/19/16 1100 07/19/16 1112 07/21/16 0443  NA 140 141 139  K 4.0 4.3 3.5  CL 102 101 102  CO2 29  --  29  GLUCOSE 113* 109* 123*  BUN 5* 7 <5*  CREATININE 0.72 0.70 0.70  CALCIUM 9.9  --  9.4    GFR: Estimated Creatinine Clearance: 106.9 mL/min (by C-G formula based on SCr of 0.7 mg/dL).  Liver Function Tests:  Recent Labs Lab 07/19/16 1100  AST 46*  ALT 47  ALKPHOS 44  BILITOT 0.9  PROT 6.9    ALBUMIN 4.5    Coagulation Profile:  Recent Labs Lab 07/19/16 1100  INR 1.02    Lipid Profile:  Recent Labs  07/20/16 0427  CHOL 162  HDL 49  LDLCALC 68  TRIG 227*  CHOLHDL 3.3     Radiology Studies: Dg Chest 2 View  Result Date: 07/19/2016 CLINICAL DATA:  Blurred vision, slurred speech, right arm numbness since yesterday knee in. Headaches over the last week. EXAM: CHEST  2 VIEW COMPARISON:  None. FINDINGS: Normal heart size. Lungs clear. No pneumothorax. No pleural effusion. Mid level thoracic compression deformity has a chronic appearance. IMPRESSION: No active cardiopulmonary disease. Electronically Signed   By: Jolaine Click M.D.   On: 07/19/2016 13:06   Ct Head Wo Contrast  Result Date: 07/19/2016 CLINICAL DATA:  Slurred speech and numbness in right arm. EXAM: CT HEAD WITHOUT CONTRAST TECHNIQUE: Contiguous axial images were obtained from the base of the skull through the vertex without intravenous contrast. COMPARISON:  None. FINDINGS: Brain: No evidence of acute infarction, hemorrhage, hydrocephalus, extra-axial collection or mass lesion/mass effect. Vascular: No hyperdense vessel or unexpected calcification. Skull: Normal. Negative for fracture or focal lesion. Sinuses/Orbits: No acute finding. Other: None. IMPRESSION: No cause for the patient's symptoms identified. No acute intracranial abnormality. Electronically Signed   By: Gerome Sam III M.D   On: 07/19/2016 11:38   Ct Chest W Contrast  Result Date: 07/20/2016 CLINICAL DATA:  Weight loss. EXAM: CT CHEST, ABDOMEN, AND PELVIS WITH CONTRAST TECHNIQUE: Multidetector CT imaging of the chest, abdomen and pelvis was performed following the standard protocol during bolus administration of intravenous contrast. CONTRAST:  ISOVUE-300 IOPAMIDOL (ISOVUE-300) INJECTION 61% COMPARISON:  None. FINDINGS: CT CHEST FINDINGS Cardiovascular: Normal caliber thoracic aorta with mild atherosclerosis. The heart is normal in size.  There are coronary artery calcifications. No pericardial effusion. Mediastinum/Nodes: No mediastinal, hilar, or axillary adenopathy. The esophagus is decompressed. Visualized thyroid gland is normal. Lungs/Pleura: Clear lungs. No consolidation, pulmonary edema or pleural fluid. No pulmonary mass or suspicious nodule. Musculoskeletal: There are no acute or suspicious osseous abnormalities. Degenerative change in the spine. CT ABDOMEN PELVIS FINDINGS Hepatobiliary: Equivocal 10 mm hypodensity in the left lobe of the liver, may simply be focal fatty infiltration Gallbladder physiologically distended, no calcified stone. No biliary dilatation. Pancreas: No ductal dilatation or inflammation. Spleen: Normal in size without focal abnormality. Adrenals/Urinary Tract: No adrenal nodule. No renal mass. No hydronephrosis or perinephric edema. Homogeneous enhancement and symmetric excretion on delayed phase imaging. Stomach/Bowel: Stomach is within normal limits. Appendix appears normal. No evidence of bowel wall thickening, distention, or inflammatory changes. Colonic diverticulosis is  most prominent in the distal descending and sigmoid colon, no acute inflammation. Vascular/Lymphatic: Aortic atherosclerosis. No enlarged abdominal or pelvic lymph nodes. Reproductive: Prostate is unremarkable. Other: No free air, free fluid, or intra-abdominal fluid collection. Scattered air in the subcutaneous tissues of the lower anterior abdominal wall likely secondary to injections. Musculoskeletal: There are no acute or suspicious osseous abnormalities. Degenerative change in the spine. IMPRESSION: 1. No explanation for weight loss. No evidence of malignancy in the chest, abdomen, or pelvis. 2. Questionable 10 mm hypodense lesion in the left lobe of the liver may simply be focal fatty infiltration. In a low risk patient with no known malignancy, hepatic dysfunction, hepatic risk factors or symptoms attributable to the liver, no further  follow-up is needed. 3. Thoracoabdominal aortic atherosclerosis. Scattered coronary artery calcifications. Electronically Signed   By: Rubye Oaks M.D.   On: 07/20/2016 22:00   Mr Angiogram Neck W Contrast  Result Date: 07/19/2016 CLINICAL DATA:  Slurred speech and RIGHT sided numbness and tingling beginning yesterday at noon, word-finding difficulties. Symptoms predominately resolved. History of hypertension. EXAM: MRI HEAD WITHOUT CONTRAST MRA HEAD WITHOUT CONTRAST MRA NECK WITHOUT AND WITH CONTRAST TECHNIQUE: Multiplanar, multiecho pulse sequences of the brain and surrounding structures were obtained without intravenous contrast. Angiographic images of the Circle of Willis were obtained using MRA technique without intravenous contrast. Angiographic images of the neck were obtained using MRA technique without and with intravenous contrast. Carotid stenosis measurements (when applicable) are obtained utilizing NASCET criteria, using the distal internal carotid diameter as the denominator. CONTRAST:  15mL MULTIHANCE GADOBENATE DIMEGLUMINE 529 MG/ML IV SOLN COMPARISON:  CT HEAD Jul 19, 2016 at 1126 hours FINDINGS: MRI HEAD FINDINGS- mildly motion degraded examination. BRAIN: Multiple subcentimeter meter foci of reduced diffusion and FLAIR T2 hyperintense signal LEFT frontal lobe, with low ADC values. Subcentimeter linear reduced diffusion LEFT periventricular caudate tail. No susceptibility artifact to suggest hemorrhage. The ventricles and sulci are normal for patient's age. No suspicious parenchymal signal, masses or mass effect. No abnormal extra-axial fluid collections. VASCULAR: Normal major intracranial vascular flow voids present at skull base. SKULL AND UPPER CERVICAL SPINE: No abnormal sellar expansion. No suspicious calvarial bone marrow signal. Craniocervical junction maintained. SINUSES/ORBITS: LEFT maxillary mucosal retention cysts. Mastoid air cells are well aerated. The included ocular globes  and orbital contents are non-suspicious. OTHER: Patient is edentulous. MRA HEAD FINDINGS- mildly motion degraded examination. ANTERIOR CIRCULATION: Normal flow related enhancement of the included cervical, petrous, cavernous and supraclinoid internal carotid arteries. Patent anterior communicating artery. Normal flow related enhancement of the anterior and middle cerebral arteries, including distal segments. Mild luminal irregularity LEFT M1 segment. Focal moderate stenosis LEFT M2 inferior segment origin. No large vessel occlusion, high-grade stenosis, aneurysm. POSTERIOR CIRCULATION: LEFT vertebral artery is dominant. Basilar artery is patent, with normal flow related enhancement of the main branch vessels. Normal flow related enhancement of the posterior cerebral arteries. No large vessel occlusion, high-grade stenosis, abnormal luminal irregularity, aneurysm. ANATOMIC VARIANTS: None. Source images and MIP images were reviewed. MRA NECK FINDINGS ANTERIOR CIRCULATION: The common carotid arteries are widely patent bilaterally. The carotid bifurcations are patent bilaterally without hemodynamically significant stenosis by NASCET criteria. No flow limiting stenosis or luminal irregularity. POSTERIOR CIRCULATION: Bilateral vertebral arteries are patent to the vertebrobasilar junction. No flow limiting stenosis or luminal irregularity. Source images and MIP images were reviewed. IMPRESSION: MRI HEAD: Acute subcentimeter LEFT MCA territory infarcts most compatible with embolic etiology. Otherwise negative mildly motion degraded noncontrast MRI head. MRA HEAD: No emergent  large vessel occlusion. Mild LEFT M1 luminal regularity and moderate stenosis LEFT M2 origin secondary to atherosclerosis, thromboembolism or motion artifact. MRA NECK: No hemodynamically significant stenosis or acute vascular process. Electronically Signed   By: Awilda Metroourtnay  Bloomer M.D.   On: 07/19/2016 16:26   Mr Brain Wo Contrast  Result Date:  07/19/2016 CLINICAL DATA:  Slurred speech and RIGHT sided numbness and tingling beginning yesterday at noon, word-finding difficulties. Symptoms predominately resolved. History of hypertension. EXAM: MRI HEAD WITHOUT CONTRAST MRA HEAD WITHOUT CONTRAST MRA NECK WITHOUT AND WITH CONTRAST TECHNIQUE: Multiplanar, multiecho pulse sequences of the brain and surrounding structures were obtained without intravenous contrast. Angiographic images of the Circle of Willis were obtained using MRA technique without intravenous contrast. Angiographic images of the neck were obtained using MRA technique without and with intravenous contrast. Carotid stenosis measurements (when applicable) are obtained utilizing NASCET criteria, using the distal internal carotid diameter as the denominator. CONTRAST:  15mL MULTIHANCE GADOBENATE DIMEGLUMINE 529 MG/ML IV SOLN COMPARISON:  CT HEAD Jul 19, 2016 at 1126 hours FINDINGS: MRI HEAD FINDINGS- mildly motion degraded examination. BRAIN: Multiple subcentimeter meter foci of reduced diffusion and FLAIR T2 hyperintense signal LEFT frontal lobe, with low ADC values. Subcentimeter linear reduced diffusion LEFT periventricular caudate tail. No susceptibility artifact to suggest hemorrhage. The ventricles and sulci are normal for patient's age. No suspicious parenchymal signal, masses or mass effect. No abnormal extra-axial fluid collections. VASCULAR: Normal major intracranial vascular flow voids present at skull base. SKULL AND UPPER CERVICAL SPINE: No abnormal sellar expansion. No suspicious calvarial bone marrow signal. Craniocervical junction maintained. SINUSES/ORBITS: LEFT maxillary mucosal retention cysts. Mastoid air cells are well aerated. The included ocular globes and orbital contents are non-suspicious. OTHER: Patient is edentulous. MRA HEAD FINDINGS- mildly motion degraded examination. ANTERIOR CIRCULATION: Normal flow related enhancement of the included cervical, petrous, cavernous and  supraclinoid internal carotid arteries. Patent anterior communicating artery. Normal flow related enhancement of the anterior and middle cerebral arteries, including distal segments. Mild luminal irregularity LEFT M1 segment. Focal moderate stenosis LEFT M2 inferior segment origin. No large vessel occlusion, high-grade stenosis, aneurysm. POSTERIOR CIRCULATION: LEFT vertebral artery is dominant. Basilar artery is patent, with normal flow related enhancement of the main branch vessels. Normal flow related enhancement of the posterior cerebral arteries. No large vessel occlusion, high-grade stenosis, abnormal luminal irregularity, aneurysm. ANATOMIC VARIANTS: None. Source images and MIP images were reviewed. MRA NECK FINDINGS ANTERIOR CIRCULATION: The common carotid arteries are widely patent bilaterally. The carotid bifurcations are patent bilaterally without hemodynamically significant stenosis by NASCET criteria. No flow limiting stenosis or luminal irregularity. POSTERIOR CIRCULATION: Bilateral vertebral arteries are patent to the vertebrobasilar junction. No flow limiting stenosis or luminal irregularity. Source images and MIP images were reviewed. IMPRESSION: MRI HEAD: Acute subcentimeter LEFT MCA territory infarcts most compatible with embolic etiology. Otherwise negative mildly motion degraded noncontrast MRI head. MRA HEAD: No emergent large vessel occlusion. Mild LEFT M1 luminal regularity and moderate stenosis LEFT M2 origin secondary to atherosclerosis, thromboembolism or motion artifact. MRA NECK: No hemodynamically significant stenosis or acute vascular process. Electronically Signed   By: Awilda Metroourtnay  Bloomer M.D.   On: 07/19/2016 16:26   Ct Abdomen Pelvis W Contrast  Result Date: 07/20/2016 CLINICAL DATA:  Weight loss. EXAM: CT CHEST, ABDOMEN, AND PELVIS WITH CONTRAST TECHNIQUE: Multidetector CT imaging of the chest, abdomen and pelvis was performed following the standard protocol during bolus  administration of intravenous contrast. CONTRAST:  100mL ISOVUE-300 IOPAMIDOL (ISOVUE-300) INJECTION 61% COMPARISON:  None. FINDINGS: CT  CHEST FINDINGS Cardiovascular: Normal caliber thoracic aorta with mild atherosclerosis. The heart is normal in size. There are coronary artery calcifications. No pericardial effusion. Mediastinum/Nodes: No mediastinal, hilar, or axillary adenopathy. The esophagus is decompressed. Visualized thyroid gland is normal. Lungs/Pleura: Clear lungs. No consolidation, pulmonary edema or pleural fluid. No pulmonary mass or suspicious nodule. Musculoskeletal: There are no acute or suspicious osseous abnormalities. Degenerative change in the spine. CT ABDOMEN PELVIS FINDINGS Hepatobiliary: Equivocal 10 mm hypodensity in the left lobe of the liver, may simply be focal fatty infiltration Gallbladder physiologically distended, no calcified stone. No biliary dilatation. Pancreas: No ductal dilatation or inflammation. Spleen: Normal in size without focal abnormality. Adrenals/Urinary Tract: No adrenal nodule. No renal mass. No hydronephrosis or perinephric edema. Homogeneous enhancement and symmetric excretion on delayed phase imaging. Stomach/Bowel: Stomach is within normal limits. Appendix appears normal. No evidence of bowel wall thickening, distention, or inflammatory changes. Colonic diverticulosis is most prominent in the distal descending and sigmoid colon, no acute inflammation. Vascular/Lymphatic: Aortic atherosclerosis. No enlarged abdominal or pelvic lymph nodes. Reproductive: Prostate is unremarkable. Other: No free air, free fluid, or intra-abdominal fluid collection. Scattered air in the subcutaneous tissues of the lower anterior abdominal wall likely secondary to injections. Musculoskeletal: There are no acute or suspicious osseous abnormalities. Degenerative change in the spine. IMPRESSION: 1. No explanation for weight loss. No evidence of malignancy in the chest, abdomen, or  pelvis. 2. Questionable 10 mm hypodense lesion in the left lobe of the liver may simply be focal fatty infiltration. In a low risk patient with no known malignancy, hepatic dysfunction, hepatic risk factors or symptoms attributable to the liver, no further follow-up is needed. 3. Thoracoabdominal aortic atherosclerosis. Scattered coronary artery calcifications. Electronically Signed   By: Rubye Oaks M.D.   On: 07/20/2016 22:00   Mr Zachary Woods Head/brain WU Cm  Result Date: 07/19/2016 CLINICAL DATA:  Slurred speech and RIGHT sided numbness and tingling beginning yesterday at noon, word-finding difficulties. Symptoms predominately resolved. History of hypertension. EXAM: MRI HEAD WITHOUT CONTRAST MRA HEAD WITHOUT CONTRAST MRA NECK WITHOUT AND WITH CONTRAST TECHNIQUE: Multiplanar, multiecho pulse sequences of the brain and surrounding structures were obtained without intravenous contrast. Angiographic images of the Circle of Willis were obtained using MRA technique without intravenous contrast. Angiographic images of the neck were obtained using MRA technique without and with intravenous contrast. Carotid stenosis measurements (when applicable) are obtained utilizing NASCET criteria, using the distal internal carotid diameter as the denominator. CONTRAST:  15mL MULTIHANCE GADOBENATE DIMEGLUMINE 529 MG/ML IV SOLN COMPARISON:  CT HEAD Jul 19, 2016 at 1126 hours FINDINGS: MRI HEAD FINDINGS- mildly motion degraded examination. BRAIN: Multiple subcentimeter meter foci of reduced diffusion and FLAIR T2 hyperintense signal LEFT frontal lobe, with low ADC values. Subcentimeter linear reduced diffusion LEFT periventricular caudate tail. No susceptibility artifact to suggest hemorrhage. The ventricles and sulci are normal for patient's age. No suspicious parenchymal signal, masses or mass effect. No abnormal extra-axial fluid collections. VASCULAR: Normal major intracranial vascular flow voids present at skull base. SKULL AND  UPPER CERVICAL SPINE: No abnormal sellar expansion. No suspicious calvarial bone marrow signal. Craniocervical junction maintained. SINUSES/ORBITS: LEFT maxillary mucosal retention cysts. Mastoid air cells are well aerated. The included ocular globes and orbital contents are non-suspicious. OTHER: Patient is edentulous. MRA HEAD FINDINGS- mildly motion degraded examination. ANTERIOR CIRCULATION: Normal flow related enhancement of the included cervical, petrous, cavernous and supraclinoid internal carotid arteries. Patent anterior communicating artery. Normal flow related enhancement of the anterior and middle cerebral arteries,  including distal segments. Mild luminal irregularity LEFT M1 segment. Focal moderate stenosis LEFT M2 inferior segment origin. No large vessel occlusion, high-grade stenosis, aneurysm. POSTERIOR CIRCULATION: LEFT vertebral artery is dominant. Basilar artery is patent, with normal flow related enhancement of the main branch vessels. Normal flow related enhancement of the posterior cerebral arteries. No large vessel occlusion, high-grade stenosis, abnormal luminal irregularity, aneurysm. ANATOMIC VARIANTS: None. Source images and MIP images were reviewed. MRA NECK FINDINGS ANTERIOR CIRCULATION: The common carotid arteries are widely patent bilaterally. The carotid bifurcations are patent bilaterally without hemodynamically significant stenosis by NASCET criteria. No flow limiting stenosis or luminal irregularity. POSTERIOR CIRCULATION: Bilateral vertebral arteries are patent to the vertebrobasilar junction. No flow limiting stenosis or luminal irregularity. Source images and MIP images were reviewed. IMPRESSION: MRI HEAD: Acute subcentimeter LEFT MCA territory infarcts most compatible with embolic etiology. Otherwise negative mildly motion degraded noncontrast MRI head. MRA HEAD: No emergent large vessel occlusion. Mild LEFT M1 luminal regularity and moderate stenosis LEFT M2 origin secondary  to atherosclerosis, thromboembolism or motion artifact. MRA NECK: No hemodynamically significant stenosis or acute vascular process. Electronically Signed   By: Awilda Metro M.D.   On: 07/19/2016 16:26     Medications:  Scheduled: .  stroke: mapping our early stages of recovery book   Does not apply Once  . aspirin EC  325 mg Oral Daily  . atorvastatin  20 mg Oral q1800  . enoxaparin (LOVENOX) injection  40 mg Subcutaneous Q24H  . pantoprazole  40 mg Oral Q0600   Continuous:  [id number] **OR** acetaminophen (TYLENOL) oral liquid 160 mg/5 mL **OR** acetaminophen, hydrALAZINE  Assessment/Plan:  Principal Problem:   Cerebral embolism with cerebral infarction Active Problems:   Hypertension   GERD (gastroesophageal reflux disease)   Malignant hypertension   Stroke (cerebrum) (HCC)    Acute embolic stroke Patient seen by neurology. Stroke workup in progress. TEE has been done, which showed PFO. No embolic source identified. Lower extremity venous Doppler negative for DVT. Urine drug screen positive for THC. CT scan of the abdomen and pelvis did not show any obvious malignancy. LDL is 68. He is on a statin. HbA1c 5.6. Hypercoagulable workup is pending. HIV nonreactive. He is on aspirin. EKG showed sinus rhythm. Transcranial Doppler is pending. Apparently there is also plan for Loop Recorder. Cleared by physical and occupational therapy.   History of hypertension. Patient does not take any medications for same. Currently allowing permissive hypertension. Blood pressure is reasonably well controlled.  History of GERD. He is on PPI.  Small liver lesion Small liver lesion identified on CT scan and suspected to be a fatty infiltration. Patient does drink alcohol regularly and up to 3-4 drinks a night. His last consumption, however, was 1 week ago. He's been asked to refrain from further use and was told that he will need to have a repeat CT scan in 3 months. He will also need  to establish with a primary care provider. He will need a colonoscopy eventually as he is 52 years of age.  DVT Prophylaxis: Lovenox    Code Status: Full code  Family Communication: Discussed with the patient and his wife  Disposition Plan: Management as outlined above. Okay for discharge once stroke workup has been completed and he has been cleared by neurology.    LOS: 1 day   [facility]  [facility] Pager [phone] 07/21/2016, 9:16 AM  If 7PM-7AM, please contact night-coverage at [email or web address], password [facility]

## 2016-07-21 NOTE — Progress Notes (Signed)
*  PRELIMINARY RESULTS* Vascular Ultrasound Transcranial Doppler with Bubbles has been completed with Dr. Roda ShuttersXu. There was no evidence of high intensity transient signals (HITS) at rest. HITS were heard at rest, suggestive of a small patent foramen ovale (PFO).  07/21/2016 4:21 PM Zachary Woods, BS, RVT, RDCS, RDMS

## 2016-07-21 NOTE — Progress Notes (Signed)
  Echocardiogram Echocardiogram Transesophageal has been performed.  Delcie RochENNINGTON, Kriss Perleberg 07/21/2016, 10:30 AM

## 2016-07-21 NOTE — Evaluation (Signed)
Speech Language Pathology Evaluation Patient Details Name: Zachary Woods MRN: [id number] DOB: November 01, 1965 Today's Date: 07/21/2016 Time: [id number] SLP Time Calculation (min) (ACUTE ONLY): 15 min  Problem List:  Patient Active Problem List   Diagnosis Date Noted  . Cerebral embolism with cerebral infarction 07/20/2016  . Stroke determined by clinical assessment (HCC) 07/20/2016  . Hypertension 07/19/2016  . GERD (gastroesophageal reflux disease) 07/19/2016  . Malignant hypertension 07/19/2016   Past Medical History:  Past Medical History:  Diagnosis Date  . Acid reflux   . CVA (cerebral vascular accident) (HCC)   . Hypertension    Past Surgical History:  Past Surgical History:  Procedure Laterality Date  . NO PAST SURGERIES     HPI:  Pt is a 51 y.o. male admitted for L sided numbness/weakness and slurred speech. MRI showed acute subcentimeter L MCA infarcts. PMH includes HTN and acid reflux   Assessment / Plan / Recommendation Clinical Impression  Pt scored a 29/30 on the MoCA, suggestive of cognitive-linguistic function that is [facility]. He has a mild R facial droop that makes his speech mildly slurred at the conversational level, although it is still intelligible. SLP reviewed exercises and speech intelligibility strategies to facilitate communication and function. Should difficulty persist upon return home, pt would benefit from OP SLP f/u as well.     SLP Assessment  SLP Recommendation/Assessment: All further Speech Lanaguage Pathology  needs can be addressed in the next venue of care SLP Visit Diagnosis: Dysarthria and anarthria (R47.1)    Follow Up Recommendations  Outpatient SLP (should symptoms persist)    Frequency and Duration           SLP Evaluation Cognition  Overall Cognitive Status: Within Functional Limits for tasks assessed       Comprehension  Auditory Comprehension Overall Auditory Comprehension: Appears within functional limits for tasks assessed     Expression Expression Primary Mode of Expression: Verbal Verbal Expression Overall Verbal Expression: Appears within functional limits for tasks assessed   Oral / Motor  Oral Motor/Sensory Function Overall Oral Motor/Sensory Function: Mild impairment Facial ROM: Reduced right;Suspected CN VII (facial) dysfunction Facial Symmetry: Abnormal symmetry right;Suspected CN VII (facial) dysfunction Facial Strength: Reduced right;Suspected CN VII (facial) dysfunction Motor Speech Overall Motor Speech: Impaired Respiration: Within functional limits Phonation: Normal Resonance: Within functional limits Articulation: Impaired Level of Impairment: Conversation Intelligibility: Intelligible Motor Planning: Witnin functional limits Motor Speech Errors: Not applicable   GO                    Maxcine Hamaiewonsky, Rogerick Baldwin 07/21/2016, 2:44 PM   Maxcine HamLaura Paiewonsky, M.A. CCC-SLP [phone]

## 2016-07-21 NOTE — Progress Notes (Signed)
STROKE TEAM PROGRESS NOTE   SUBJECTIVE (INTERVAL HISTORY) His wife is at the bedside.  Pt has no complains. Had TEE showed positive PFO. TCD bubble study showed small PFO with valsalva. Will refer to DR. Copper.     OBJECTIVE Temp:  [98.5 F (36.9 C)-99 F (37.2 C)] 98.8 F (37.1 C) (05/21 0500) Pulse Rate:  [74-85] 81 (05/21 0500) Cardiac Rhythm: Normal sinus rhythm (05/20 1900) Resp:  [17-20] 17 (05/21 0500) BP: (130-141)/(81-94) 138/84 (05/21 0500) SpO2:  [97 %-99 %] 97 % (05/21 0500)  CBC:   Recent Labs Lab 07/19/16 1100 07/19/16 1112 07/21/16 0443  WBC 7.3  --  5.7  NEUTROABS 4.8  --   --   HGB 13.2 13.3 12.5*  HCT 37.5* 39.0 35.9*  MCV 93.8  --  94.5  PLT 206  --  183    Basic Metabolic Panel:   Recent Labs Lab 07/19/16 1100 07/19/16 1112 07/21/16 0443  NA 140 141 139  K 4.0 4.3 3.5  CL 102 101 102  CO2 29  --  29  GLUCOSE 113* 109* 123*  BUN 5* 7 <5*  CREATININE 0.72 0.70 0.70  CALCIUM 9.9  --  9.4    Lipid Panel:     Component Value Date/Time   CHOL 162 07/20/2016 0427   TRIG 227 (H) 07/20/2016 0427   HDL 49 07/20/2016 0427   CHOLHDL 3.3 07/20/2016 0427   VLDL 45 (H) 07/20/2016 0427   LDLCALC 68 07/20/2016 0427   HgbA1c:  Lab Results  Component Value Date   HGBA1C 5.6 07/20/2016   Urine Drug Screen:     Component Value Date/Time   LABOPIA NONE DETECTED 07/20/2016 1943   COCAINSCRNUR NONE DETECTED 07/20/2016 1943   LABBENZ NONE DETECTED 07/20/2016 1943   AMPHETMU NONE DETECTED 07/20/2016 1943   THCU POSITIVE (A) 07/20/2016 1943   LABBARB NONE DETECTED 07/20/2016 1943    Alcohol Level No results found for: ETH  IMAGING I have personally reviewed the radiological images below and agree with the radiology interpretations.  Dg Chest 2 View 07/19/2016 No active cardiopulmonary disease.   Ct Head Wo Contrast 07/19/2016 No cause for the patient's symptoms identified. No acute intracranial abnormality.   Mr Angiogram Neck W  Contrast 07/19/2016  MRI HEAD:  Acute subcentimeter LEFT MCA territory infarcts most compatible with embolic etiology. Otherwise negative mildly motion degraded noncontrast MRI head.   MRA HEAD:  No emergent large vessel occlusion. Mild LEFT M1 luminal regularity and moderate stenosis LEFT M2 origin secondary to atherosclerosis, thromboembolism or motion artifact.   MRA NECK:  No hemodynamically significant stenosis or acute vascular process.   LE venous doppler - negative  TTE - Left ventricle: The cavity size was normal. Wall thickness was   increased in a pattern of mild LVH. Systolic function was normal.   The estimated ejection fraction was in the range of 55% to 60%.   Wall motion was normal; there were no regional wall motion   abnormalities. There was no evidence of elevated ventricular   filling pressure by Doppler parameters. - Mitral valve: There was mild regurgitation. Impressions: - No cardiac source of emboli was indentified.  TEE  07/21/2016 Impressions: - Positive bubble study consistent with present PFO.   No thrombus in the left atrium or lefta trial appendage.   EF 55-60%.  TCD bubble study - 15-20 HITs with valsalva  CT chest / abd / pelvis With Contrast 07/20/2016 1. No explanation for weight loss. No evidence  of malignancy in the chest, abdomen, or pelvis. 2. Questionable 10 mm hypodense lesion in the left lobe of the liver may simply be focal fatty infiltration. In a low risk patient with no known malignancy, hepatic dysfunction, hepatic risk factors or symptoms attributable to the liver, no further follow-up is needed. 3. Thoracoabdominal aortic atherosclerosis. Scattered coronary artery calcifications.   PHYSICAL EXAM  Temp:  [98.5 F (36.9 C)-99 F (37.2 C)] 98.8 F (37.1 C) (05/21 0500) Pulse Rate:  [74-85] 81 (05/21 0500) Resp:  [17-20] 17 (05/21 0500) BP: (130-141)/(81-94) 138/84 (05/21 0500) SpO2:  [97 %-99 %] 97 % (05/21 0500)  General -  Well nourished, well developed, in no apparent distress.  Ophthalmologic - Sharp disc margins OU.   Cardiovascular - Regular rate and rhythm.  Mental Status -  Level of arousal and orientation to time, place, and person were intact. Language including expression, naming, repetition, comprehension was assessed and found intact. Attention span and concentration were normal. Fund of Knowledge was assessed and was intact.  Cranial Nerves II - XII - II - Visual field intact OU. III, IV, VI - Extraocular movements intact. V - Facial sensation intact bilaterally. VII - mild right facial droop. VIII - Hearing & vestibular intact bilaterally. X - Palate elevates symmetrically. XI - Chin turning & shoulder shrug intact bilaterally. XII - Tongue protrusion intact.  Motor Strength - The patient's strength was normal in all extremities and pronator drift was absent.  Bulk was normal and fasciculations were absent.   Motor Tone - Muscle tone was assessed at the neck and appendages and was normal.  Reflexes - The patient's reflexes were 1+ in all extremities and he had no pathological reflexes.  Sensory - Light touch, temperature/pinprick, vibration and proprioception, and Romberg testing were assessed and were symmetrical.    Coordination - The patient had normal movements in the hands and feet with no ataxia or dysmetria.  Tremor was absent.  Gait and Station - The patient's transfers, posture, gait, station, and turns were observed as normal.   ASSESSMENT/PLAN [name] is a 51 y.o. male with history of acid reflux and hypertension presenting with speech difficulties with numbness and tingling of the left extremities. He did not receive IV t-PA due to late presentation.  Stroke:  Acute scattered punctate LEFT MCA territory infarcts, embolic pattern. Pt lack of stroke risk factor, concerning stroke is related to PFO. Will refer to Dr. Excell Seltzerooper for evaluation.   Resultant mild right  facial droop  CT head - No acute intracranial abnormality.   MRI head - Acute subcentimeter LEFT MCA territory infarcts most compatible with embolic etiology.  MRA head and neck unremarkable.  LE venous Dopplers - no DVT  2D Echo - EF 55-60%  TCD bubble study - small PFO with valsalva  TEE - Positive bubble study consistent with present PFO.  Bilateral LE Dopplers - negative for DVT  Pan CT - No evidence of malignancy in the chest, abdomen, or pelvis.  LDL - 68  HgbA1c - 5.6  HIV negative  UDS - positive for THC  Hypercoagulable work up - pending  VTE prophylaxis - Lovenox Diet NPO time specified Except for: Sips with Meds  No antithrombotic prior to admission, now on aspirin 325 mg daily. Continue ASA on discharge.  Patient counseled to be compliant with his antithrombotic medications  Ongoing aggressive stroke risk factor management  Therapy recommendations: None.   Disposition: Pending  PFO  Positive PFO on TEE  Small PFO with valsalva on TCD bubble study  Concerning current stroke is related to PFO  Will refer to Dr. Excell Seltzer for evaluation  Hypertension  Occasional low blood pressures otherwise stable.  Long-term BP goal normotensive  Other Stroke Risk Factors  Former cigarette smoker - quit 7 years ago  ETOH use, advised to drink no more than 1 drink per day.  Other Active Problems      Hospital day # 1  Neurology will sign off. Please call with questions. Pt will follow up with Dr. Roda Shutters at [facility] in about 2 months. Thanks for the consult.   Marvel Plan, MD PhD Stroke Neurology 07/21/2016 3:38 PM     To contact Stroke Continuity provider, please refer to [email or web address]. After hours, contact General Neurology

## 2016-07-22 ENCOUNTER — Telehealth: Payer: Self-pay

## 2016-07-22 LAB — CARDIOLIPIN ANTIBODIES, IGG, IGM, IGA: Anticardiolipin IgM: <9 MPL U/mL (ref 0–12)

## 2016-07-22 LAB — LUPUS ANTICOAGULANT PANEL
DRVVT: 34 s (ref 0.0–47.0)
PTT Lupus Anticoagulant: 32.2 s (ref 0.0–51.9)

## 2016-07-22 LAB — HOMOCYSTEINE: Homocysteine: 17.3 umol/L — ABNORMAL HIGH (ref 0.0–15.0)

## 2016-07-22 NOTE — Telephone Encounter (Signed)
I spoke with the pt and scheduled him for PFO consult on 07/23/16 with Dr Excell Seltzerooper.

## 2016-07-22 NOTE — Telephone Encounter (Signed)
Referral received from Dr Roda ShuttersXu in regards to pt seeing Dr Excell Seltzerooper to discuss PFO closure.  I have left the pt a message on home and mobile number to contact the office and schedule an appointment tomorrow with Dr Excell Seltzerooper.

## 2016-07-23 ENCOUNTER — Ambulatory Visit (INDEPENDENT_AMBULATORY_CARE_PROVIDER_SITE_OTHER): Payer: 59 | Admitting: Cardiovascular Disease

## 2016-07-23 ENCOUNTER — Encounter (INDEPENDENT_AMBULATORY_CARE_PROVIDER_SITE_OTHER): Payer: Self-pay

## 2016-07-23 ENCOUNTER — Encounter: Payer: Self-pay | Admitting: Cardiovascular Disease

## 2016-07-23 VITALS — BP 130/76 | HR 78 | Ht 69.0 in | Wt 159.4 lb

## 2016-07-23 DIAGNOSIS — Q211 Atrial septal defect: Secondary | ICD-10-CM | POA: Diagnosis not present

## 2016-07-23 DIAGNOSIS — Q2112 Patent foramen ovale: Secondary | ICD-10-CM

## 2016-07-23 LAB — BETA-2-GLYCOPROTEIN I ABS, IGG/M/A
BETA-2-GLYCOPROTEIN I IGA: 27 GPI IgA units — AB (ref 0–25)
Beta-2 Glyco I IgG: 57 GPI IgG units — ABNORMAL HIGH (ref 0–20)

## 2016-07-23 MED ORDER — PANTOPRAZOLE SODIUM 40 MG PO TBEC: 40.0000 mg | DELAYED_RELEASE_TABLET | Freq: Every day | ORAL | 6 refills | Status: DC

## 2016-07-23 MED ORDER — CLOPIDOGREL BISULFATE 75 MG PO TABS: 75.0000 mg | ORAL_TABLET | Freq: Every day | ORAL | 6 refills | Status: DC

## 2016-07-23 NOTE — Progress Notes (Signed)
Cardiology Office Note Date:  07/23/2016   ID:  Zachary Woods, DOB March 15, 1965, MRN [id number]  PCP:  Patient, No Pcp Per  Cardiologist:  Tonny Bollman, MD    Chief Complaint  Patient presents with  . PFO consult   History of Present Illness: Zachary Woods is a 51 y.o. male who presents for evaluation of PFO closure, referred by Dr Roda Shutters.   The patient was recently admitted with a left MCA territory stroke that occurred in an embolic pattern. The patient did not have typical risk factors for stroke, but he was found to have a PFO by transesophageal echo. He is referred by Dr Roda Shutters for consideration of PFO closure.   The patient experienced sudden onset of slurred speech, blurry vision in the left eye, and numbness in the right arm. Symptoms improved within a few hours. He has no past history of stroke. No history of cardiac problems. He is a former smoker, used to have hypertension but lost a lot of weight and quit smoking with improvement in BP. 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   He is here with his wife today. He is feeling well and states that his symptoms have mostly resolved. He denies chest pain, chest pressure, heart palpitations, or shortness of breath.   Past Medical History:  Diagnosis Date  . Acid reflux   . CVA (cerebral vascular accident) (HCC)   . Hypertension     Past Surgical History:  Procedure Laterality Date  . NO PAST SURGERIES    . TEE WITHOUT CARDIOVERSION N/A 07/21/2016   Procedure: TRANSESOPHAGEAL ECHOCARDIOGRAM (TEE);  Surgeon: Lars Masson, MD;  Location: [facility] ENDOSCOPY;  Service: Cardiovascular;  Laterality: N/A;    Current Outpatient Prescriptions  Medication Sig Dispense Refill  . aspirin EC 325 MG EC tablet Take 1 tablet (325 mg total) by mouth daily. 30 tablet 3  . atorvastatin (LIPITOR) 20 MG tablet Take 1 tablet (20 mg total) by mouth daily at 6 PM. 30 tablet 1  . Multiple Vitamin  (MULTI-VITAMIN PO) Take 1 tablet by mouth daily.    Marland Kitchen omeprazole (PRILOSEC) 20 MG capsule Take 20 mg by mouth daily.     No current facility-administered medications for this visit.     Allergies:   Penicillins   Social History:  The patient  reports that he has quit smoking. He has never used smokeless tobacco. He reports that he drinks alcohol. He reports that he does not use drugs.   Family History:  The patient's  family history includes Diabetes in his brother and father.    ROS:  Please see the history of present illness.   All other systems are reviewed and negative.    PHYSICAL EXAM: VS:  BP 130/76   Pulse 78   Ht 5\' 9"  (1.753 m)   Wt 159 lb 6.4 oz (72.3 kg)   BMI 23.54 kg/m  , BMI Body mass index is 23.54 kg/m. GEN: Well nourished, well developed, in no acute distress  HEENT: normal  Neck: no JVD, no masses. No carotid bruits Cardiac: RRR without murmur or gallop                Respiratory:  clear to auscultation bilaterally, normal work of breathing GI: soft, nontender, nondistended, + BS MS: no deformity or atrophy  Ext: no pretibial edema, pedal pulses 2+= bilaterally Skin: warm and dry, no rash Neuro:  Strength and  sensation are intact Psych: euthymic mood, full affect  EKG:  EKG is ordered today. The ekg ordered today shows NSR with possible age-indeterminate inferior infarct likely normal variant   Recent Labs: 07/19/2016: ALT 47 07/21/2016: BUN <5; Creatinine, Ser 0.70; Hemoglobin 12.5; Platelets 183; Potassium 3.5; Sodium 139   Lipid Panel     Component Value Date/Time   CHOL 162 07/20/2016 0427   TRIG 227 (H) 07/20/2016 0427   HDL 49 07/20/2016 0427   CHOLHDL 3.3 07/20/2016 0427   VLDL 45 (H) 07/20/2016 0427   LDLCALC 68 07/20/2016 0427      Wt Readings from Last 3 Encounters:  07/23/16 159 lb 6.4 oz (72.3 kg)  07/21/16 165 lb (74.8 kg)     Cardiac Studies Reviewed: Echo: Study Conclusions  - Left ventricle: The cavity size was normal.  Wall thickness was   increased in a pattern of mild LVH. Systolic function was normal.   The estimated ejection fraction was in the range of 55% to 60%.   Wall motion was normal; there were no regional wall motion   abnormalities. There was no evidence of elevated ventricular   filling pressure by Doppler parameters. - Mitral valve: There was mild regurgitation.  Impressions:  - No cardiac source of emboli was indentified.  TEE 07/21/2016: Study Conclusions  - Left ventricle: There was mild concentric hypertrophy. Systolic   function was normal. The estimated ejection fraction was in the   range of 55% to 60%. Wall motion was normal; there were no   regional wall motion abnormalities. - Aortic valve: No evidence of vegetation. - Left atrium: No evidence of thrombus in the atrial cavity or   appendage. No evidence of thrombus in the atrial cavity or   appendage. - Right atrium: No evidence of thrombus in the atrial cavity or   appendage. - Atrial septum: There was a patent foramen ovale. - Tricuspid valve: No evidence of vegetation. - Pulmonic valve: No evidence of vegetation.  Impressions:  - Positive bubble study consistent with present PFO.   No thrombus in the left atrium or lefta trial appendage.  TCD: Summary: There was no evidence of high intensity transient signals (HITS) at rest, however, 15-20 HITS were observed with release of valsalva maneuver, suggestive of a small patent foramen ovale (PFO). Amended  ASSESSMENT AND PLAN: PFO with cryptogenic stroke: Radiographic results, hospital notes, lab results, and cardiac imaging data are reviewed. TEE images are reviewed and demonstrate a PFO with small right to left shunt demonstrated by agitated saline study.   The patient's Rope Score is 6, indicating a moderate/high (62%) probability that the patient's stroke is 'PFO-related.'  The patient is counseled about the association of PFO and cryptogenic stroke.  Available clinical trial data is reviewed, specifically those trials comparing transcatheter PFO closure and medical therapy with antiplatelet drugs. The patient understands the potential benefit of PFO closure with respect to secondary stroke reduction compared with medical therapy alone. Specific risks of transcatheter PFO closure are reviewed with the patient. These risks include bleeding, infection, device embolization, stroke, cardiac perforation, tamponade, arrhythmia, MI, and late device erosion. He understands these serious risks occur at low incidence of < 1%.   One specific issue with his case is that his PFO is difficult to visualize on TEE. There clearly are bubbles crossing right to left. His transcranial Doppler study is also positive. The patient and his wife are counseled that I plan to carefully image the interatrial septum with intracardiac echo  at the time of planned PFO closure. They also understand that there is a small chance that the PFO may not be amenable to transcatheter closure based on the intracardiac echo findings. They agree to proceed as planned. As long as the PFO is large enough to close based on ICE evaluation, will proceed with transcatheter closure.  Current medicines are reviewed with the patient today.  The patient does not have concerns regarding medicines.  Labs/ tests ordered today include:  No orders of the defined types were placed in this encounter.  Disposition:   PFO closure to be scheduled  Signed, Tonny Bollman, MD  07/23/2016 12:38 PM    [facility] [address] [place], [place], [place]  [address] Phone: [phone]; Fax: [phone]

## 2016-07-23 NOTE — Patient Instructions (Addendum)
Medication Instructions:  Your physician has recommended you make the following change in your medication:  1. START Plavix (clopidogrel) 75mg  take one tablet by mouth daily 2. STOP Omeprazole 3. START Protonix (pantoprazole) 40mg  take one tablet by mouth daily  Labwork: No new orders.   Testing/Procedures: Your physician has recommended a PFO closure.     [facility] [address], [address] [place] [address] Dept: [phone] Loc: [phone]  Wylie HailJames S Leibold  07/23/2016   You are scheduled for a PFO Closure on Friday, June 1 with Dr. Tonny BollmanMichael Cooper.  1. [facility]: [address] [place], [place] [address] at 10:30 AM (two hours before your procedure to ensure your preparation). Free valet parking service is available.   Special note: Every effort is made to have your procedure done on time. Please understand that emergencies sometimes delay scheduled procedures.  2. Diet: Do not eat or drink anything after midnight prior to your procedure except sips of water to take medications.  3. Labs: None needed.  4. Medication instructions in preparation for your procedure:  On the morning of your procedure, take your Aspirin and Plavix/Clopidogrel and any morning medicines NOT listed above.  You may use sips of water.  5. Plan for one night stay--bring personal belongings.  6. Bring a current list of your medications and current insurance cards.  7. You MUST have a responsible person to drive you home.  8. Someone MUST be with you the first 24 hours after you arrive home or your discharge will be delayed.  9. Please wear clothes that are easy to get on and off and wear slip-on shoes.  Thank you for allowing us to care for you!   -- [facility]   Follow-Up: Your physician recommends that  you schedule a follow-up appointment in: 1 MONTH with PA/NP   Any Other Special Instructions Will Be Listed Below (If Applicable).  If you need a refill on your cardiac medications before your next appointment, please call your pharmacy.

## 2016-08-01 ENCOUNTER — Encounter: Payer: Self-pay | Admitting: *Deleted

## 2016-08-01 ENCOUNTER — Encounter (HOSPITAL_COMMUNITY)
Admission: RE | Disposition: A | Discharge status: Home / Self Care | Payer: Self-pay | Source: Ambulatory Visit | Attending: Cardiovascular Disease

## 2016-08-01 ENCOUNTER — Ambulatory Visit (HOSPITAL_COMMUNITY)
Admission: RE | Admit: 2016-08-01 | Discharge: 2016-08-02 | Disposition: A | Discharge status: Home / Self Care | Payer: 59 | Source: Ambulatory Visit | Attending: Cardiovascular Disease | Admitting: Cardiovascular Disease

## 2016-08-01 DIAGNOSIS — Q2112 Patent foramen ovale: Secondary | ICD-10-CM

## 2016-08-01 DIAGNOSIS — Z7982 Long term (current) use of aspirin: Secondary | ICD-10-CM | POA: Diagnosis not present

## 2016-08-01 DIAGNOSIS — Q211 Atrial septal defect: Secondary | ICD-10-CM | POA: Diagnosis not present

## 2016-08-01 DIAGNOSIS — Z87891 Personal history of nicotine dependence: Secondary | ICD-10-CM | POA: Diagnosis not present

## 2016-08-01 DIAGNOSIS — K219 Gastro-esophageal reflux disease without esophagitis: Secondary | ICD-10-CM | POA: Insufficient documentation

## 2016-08-01 DIAGNOSIS — Z880 Allergy status to penicillin: Secondary | ICD-10-CM | POA: Insufficient documentation

## 2016-08-01 DIAGNOSIS — Z8673 Personal history of transient ischemic attack (TIA), and cerebral infarction without residual deficits: Secondary | ICD-10-CM | POA: Diagnosis not present

## 2016-08-01 DIAGNOSIS — Z7902 Long term (current) use of antithrombotics/antiplatelets: Secondary | ICD-10-CM | POA: Diagnosis not present

## 2016-08-01 DIAGNOSIS — Z006 Encounter for examination for normal comparison and control in clinical research program: Secondary | ICD-10-CM

## 2016-08-01 DIAGNOSIS — I10 Essential (primary) hypertension: Secondary | ICD-10-CM | POA: Diagnosis not present

## 2016-08-01 HISTORY — PX: PATENT FORAMEN OVALE(PFO) CLOSURE: CATH118300

## 2016-08-01 LAB — POCT ACTIVATED CLOTTING TIME
ACTIVATED CLOTTING TIME: 208 s
Activated Clotting Time: 213 seconds
Activated Clotting Time: 164 seconds

## 2016-08-01 SURGERY — PATENT FORAMEN OVALE (PFO) CLOSURE
Anesthesia: LOCAL

## 2016-08-01 MED ORDER — SODIUM CHLORIDE 0.9% FLUSH: 3.0000 mL | Freq: Two times a day (BID) | INTRAVENOUS | Status: DC

## 2016-08-01 MED ORDER — ONDANSETRON HCL 4 MG/2ML IJ SOLN: 4.0000 mg | Freq: Four times a day (QID) | INTRAMUSCULAR | Status: DC | PRN

## 2016-08-01 MED ORDER — LIDOCAINE HCL (PF) 1 % IJ SOLN
INTRAMUSCULAR | Status: DC | PRN
  Administered 2016-08-01: 18 mL

## 2016-08-01 MED ORDER — HEPARIN (PORCINE) IN NACL 2-0.9 UNIT/ML-% IJ SOLN
INTRAMUSCULAR | Status: AC
  Filled 2016-08-01: qty 500

## 2016-08-01 MED ORDER — FENTANYL CITRATE (PF) 100 MCG/2ML IJ SOLN
INTRAMUSCULAR | Status: AC
  Filled 2016-08-01: qty 2

## 2016-08-01 MED ORDER — ASPIRIN EC 81 MG PO TBEC
81.0000 mg | DELAYED_RELEASE_TABLET | Freq: Every day | ORAL | Status: DC
  Administered 2016-08-02: 09:00:00 81 mg via ORAL
  Filled 2016-08-01: qty 1

## 2016-08-01 MED ORDER — SODIUM CHLORIDE 0.9 % WEIGHT BASED INFUSION: 1.0000 mL/kg/h | INTRAVENOUS | Status: AC

## 2016-08-01 MED ORDER — VANCOMYCIN HCL IN DEXTROSE 1-5 GM/200ML-% IV SOLN
1000.0000 mg | Freq: Once | INTRAVENOUS | Status: AC
  Administered 2016-08-01: 1000 mg via INTRAVENOUS

## 2016-08-01 MED ORDER — HEPARIN (PORCINE) IN NACL 2-0.9 UNIT/ML-% IJ SOLN
INTRAMUSCULAR | Status: AC
  Filled 2016-08-01: qty 1000

## 2016-08-01 MED ORDER — VANCOMYCIN HCL IN DEXTROSE 1-5 GM/200ML-% IV SOLN
INTRAVENOUS | Status: AC
  Filled 2016-08-01: qty 200

## 2016-08-01 MED ORDER — ASPIRIN 81 MG PO CHEW: 81.0000 mg | CHEWABLE_TABLET | ORAL | Status: DC

## 2016-08-01 MED ORDER — ATORVASTATIN CALCIUM 20 MG PO TABS: 20.0000 mg | ORAL_TABLET | Freq: Every day | ORAL | Status: DC

## 2016-08-01 MED ORDER — HEPARIN SODIUM (PORCINE) 1000 UNIT/ML IJ SOLN
INTRAMUSCULAR | Status: DC | PRN
  Administered 2016-08-01: 5000 [IU] via INTRAVENOUS
  Administered 2016-08-01: 2000 [IU] via INTRAVENOUS

## 2016-08-01 MED ORDER — SODIUM CHLORIDE 0.9 % IV SOLN: 250.0000 mL | INTRAVENOUS | Status: DC | PRN

## 2016-08-01 MED ORDER — LIDOCAINE HCL (PF) 1 % IJ SOLN
INTRAMUSCULAR | Status: AC
  Filled 2016-08-01: qty 30

## 2016-08-01 MED ORDER — ALPRAZOLAM 0.25 MG PO TABS: 0.2500 mg | ORAL_TABLET | Freq: Three times a day (TID) | ORAL | Status: DC | PRN

## 2016-08-01 MED ORDER — SODIUM CHLORIDE 0.9% FLUSH: 3.0000 mL | INTRAVENOUS | Status: DC | PRN

## 2016-08-01 MED ORDER — SODIUM CHLORIDE 0.9 % WEIGHT BASED INFUSION
3.0000 mL/kg/h | INTRAVENOUS | Status: DC
  Administered 2016-08-01: 3 mL/kg/h via INTRAVENOUS

## 2016-08-01 MED ORDER — HYDRALAZINE HCL 20 MG/ML IJ SOLN
10.0000 mg | INTRAMUSCULAR | Status: DC | PRN
  Administered 2016-08-01: 22:00:00 10 mg via INTRAVENOUS
  Filled 2016-08-01: qty 1

## 2016-08-01 MED ORDER — IOPAMIDOL (ISOVUE-370) INJECTION 76%
INTRAVENOUS | Status: AC
  Filled 2016-08-01: qty 50

## 2016-08-01 MED ORDER — CLOPIDOGREL BISULFATE 75 MG PO TABS
75.0000 mg | ORAL_TABLET | Freq: Every day | ORAL | Status: DC
  Administered 2016-08-02: 09:00:00 75 mg via ORAL
  Filled 2016-08-01: qty 1

## 2016-08-01 MED ORDER — PANTOPRAZOLE SODIUM 40 MG PO TBEC
40.0000 mg | DELAYED_RELEASE_TABLET | Freq: Every day | ORAL | Status: DC
  Administered 2016-08-02: 40 mg via ORAL
  Filled 2016-08-01: qty 1

## 2016-08-01 MED ORDER — FENTANYL CITRATE (PF) 100 MCG/2ML IJ SOLN
INTRAMUSCULAR | Status: DC | PRN
  Administered 2016-08-01: 50 ug via INTRAVENOUS

## 2016-08-01 MED ORDER — SODIUM CHLORIDE 0.9 % WEIGHT BASED INFUSION: 1.0000 mL/kg/h | INTRAVENOUS | Status: DC

## 2016-08-01 MED ORDER — HEPARIN SODIUM (PORCINE) 1000 UNIT/ML IJ SOLN
INTRAMUSCULAR | Status: AC
  Filled 2016-08-01: qty 1

## 2016-08-01 MED ORDER — MIDAZOLAM HCL 2 MG/2ML IJ SOLN
INTRAMUSCULAR | Status: AC
  Filled 2016-08-01: qty 2

## 2016-08-01 MED ORDER — ACETAMINOPHEN 325 MG PO TABS: 650.0000 mg | ORAL_TABLET | ORAL | Status: DC | PRN

## 2016-08-01 MED ORDER — HEPARIN (PORCINE) IN NACL 2-0.9 UNIT/ML-% IJ SOLN
INTRAMUSCULAR | Status: AC | PRN
  Administered 2016-08-01: 1500 mL via INTRA_ARTERIAL

## 2016-08-01 MED ORDER — MIDAZOLAM HCL 2 MG/2ML IJ SOLN
INTRAMUSCULAR | Status: DC | PRN
  Administered 2016-08-01: 2 mg via INTRAVENOUS

## 2016-08-01 SURGICAL SUPPLY — 17 items
CATH ACUNAV 8FR 90CM (CATHETERS) ×1 IMPLANT
CATH PRO-FLO 6F MPA-2 (CATHETERS) ×1 IMPLANT
COVER SWIFTLINK CONNECTOR (BAG) ×1 IMPLANT
GUIDEWIRE AMPLATZER 1.5JX260 (WIRE) ×1 IMPLANT
GUIDEWIRE ANGLED .035X260CM (WIRE) ×1 IMPLANT
KIT HEART LEFT (KITS) ×2 IMPLANT
OCCLUDER AMPLATZER PFO 25MM (Prosthesis & Implant Heart) ×1 IMPLANT
PACK CARDIAC CATHETERIZATION (CUSTOM PROCEDURE TRAY) ×2 IMPLANT
PROTECTION STATION PRESSURIZED (MISCELLANEOUS) ×2
SHEATH BRITE TIP 8FR 35CM (SHEATH) IMPLANT
SHEATH INTROD W/O MIN 9FR 25CM (SHEATH) ×1 IMPLANT
SHEATH PINNACLE 8F 10CM (SHEATH) ×1 IMPLANT
SHEATH PINNACLE 9F 10CM (SHEATH) IMPLANT
STATION PROTECTION PRESSURIZED (MISCELLANEOUS) IMPLANT
SYSTEM DELIVERY AMPLATZER 8FR (SHEATH) ×1 IMPLANT
TRANSDUCER W/STOPCOCK (MISCELLANEOUS) ×2 IMPLANT
WIRE EMERALD 3MM-J .035X150CM (WIRE) ×1 IMPLANT

## 2016-08-01 NOTE — Interval H&P Note (Signed)
History and Physical Interval Note:  08/01/2016 2:38 PM  Zachary Woods  has presented today for surgery, with the diagnosis of PFO  The various methods of treatment have been discussed with the patient and family. After consideration of risks, benefits and other options for treatment, the patient has consented to  Procedure(s): Patent [place] Ovale(PFO) Closure (N/A) as a surgical intervention .  The patient's history has been reviewed, patient examined, no change in status, stable for surgery.  I have reviewed the patient's chart and labs.  Questions were answered to the patient's satisfaction.     Tonny Bollmanooper, Jesselyn Rask

## 2016-08-01 NOTE — Progress Notes (Signed)
Site area: Right groin a 8 and 9 french venous sheath was removed by Verdell FaceSallie Goins RN  Site Prior to Removal:  Level 0  Pressure Applied For 20 MINUTES    Bedrest Beginning at 1645p  Manual:   Yes.    Patient Status During Pull:  stable  Post Pull Groin Site:  Level 0  Post Pull Instructions Given:  Yes.    Post Pull Pulses Present:  Yes.    Dressing Applied:  Yes.    Comments:  VS remain stable during sheath pull

## 2016-08-01 NOTE — H&P (View-Only) (Signed)
 Cardiology Office Note Date:  07/23/2016   ID:  Zachary Woods, DOB 07/21/1965, MRN [id number]  PCP:  Patient, No Pcp Per  Cardiologist:  Doneisha Ivey, MD    Chief Complaint  Patient presents with  . PFO consult   History of Present Illness: Zachary Woods is a 51 y.o. male who presents for evaluation of PFO closure, referred by Dr Xu.   The patient was recently admitted with a left MCA territory stroke that occurred in an embolic pattern. The patient did not have typical risk factors for stroke, but he was found to have a PFO by transesophageal echo. He is referred by Dr Xu for consideration of PFO closure.   The patient experienced sudden onset of slurred speech, blurry vision in the left eye, and numbness in the right arm. Symptoms improved within a few hours. He has no past history of stroke. No history of cardiac problems. He is a former smoker, used to have hypertension but lost a lot of weight and quit smoking with improvement in BP. The patient works as a mechanic and he was at work when he experienced the recent symptoms.   He is here with his wife today. He is feeling well and states that his symptoms have mostly resolved. He denies chest pain, chest pressure, heart palpitations, or shortness of breath.   Past Medical History:  Diagnosis Date  . Acid reflux   . CVA (cerebral vascular accident) (HCC)   . Hypertension     Past Surgical History:  Procedure Laterality Date  . NO PAST SURGERIES    . TEE WITHOUT CARDIOVERSION N/A 07/21/2016   Procedure: TRANSESOPHAGEAL ECHOCARDIOGRAM (TEE);  Surgeon: Nelson, Katarina H, MD;  Location: [facility];  Service: Cardiovascular;  Laterality: N/A;    Current Outpatient Prescriptions  Medication Sig Dispense Refill  . aspirin EC 325 MG EC tablet Take 1 tablet (325 mg total) by mouth daily. 30 tablet 3  . atorvastatin (LIPITOR) 20 MG tablet Take 1 tablet (20 mg total) by mouth daily at 6 PM. 30 tablet 1  . Multiple Vitamin  (MULTI-VITAMIN PO) Take 1 tablet by mouth daily.    . omeprazole (PRILOSEC) 20 MG capsule Take 20 mg by mouth daily.     No current facility-administered medications for this visit.     Allergies:   Penicillins   Social History:  The patient  reports that he has quit smoking. He has never used smokeless tobacco. He reports that he drinks alcohol. He reports that he does not use drugs.   Family History:  The patient's  family history includes Diabetes in his brother and father.    ROS:  Please see the history of present illness.   All other systems are reviewed and negative.    PHYSICAL EXAM: VS:  BP 130/76   Pulse 78   Ht 5' 9" (1.753 m)   Wt 159 lb 6.4 oz (72.3 kg)   BMI 23.54 kg/m  , BMI Body mass index is 23.54 kg/m. GEN: Well nourished, well developed, in no acute distress  HEENT: normal  Neck: no JVD, no masses. No carotid bruits Cardiac: RRR without murmur or gallop                Respiratory:  clear to auscultation bilaterally, normal work of breathing GI: soft, nontender, nondistended, + BS MS: no deformity or atrophy  Ext: no pretibial edema, pedal pulses 2+= bilaterally Skin: warm and dry, no rash Neuro:  Strength and   sensation are intact Psych: euthymic mood, full affect  EKG:  EKG is ordered today. The ekg ordered today shows NSR with possible age-indeterminate inferior infarct likely normal variant   Recent Labs: 07/19/2016: ALT 47 07/21/2016: BUN <5; Creatinine, Ser 0.70; Hemoglobin 12.5; Platelets 183; Potassium 3.5; Sodium 139   Lipid Panel     Component Value Date/Time   CHOL 162 07/20/2016 0427   TRIG 227 (H) 07/20/2016 0427   HDL 49 07/20/2016 0427   CHOLHDL 3.3 07/20/2016 0427   VLDL 45 (H) 07/20/2016 0427   LDLCALC 68 07/20/2016 0427      Wt Readings from Last 3 Encounters:  07/23/16 159 lb 6.4 oz (72.3 kg)  07/21/16 165 lb (74.8 kg)     Cardiac Studies Reviewed: Echo: Study Conclusions  - Left ventricle: The cavity size was normal.  Wall thickness was   increased in a pattern of mild LVH. Systolic function was normal.   The estimated ejection fraction was in the range of 55% to 60%.   Wall motion was normal; there were no regional wall motion   abnormalities. There was no evidence of elevated ventricular   filling pressure by Doppler parameters. - Mitral valve: There was mild regurgitation.  Impressions:  - No cardiac source of emboli was indentified.  TEE 07/21/2016: Study Conclusions  - Left ventricle: There was mild concentric hypertrophy. Systolic   function was normal. The estimated ejection fraction was in the   range of 55% to 60%. Wall motion was normal; there were no   regional wall motion abnormalities. - Aortic valve: No evidence of vegetation. - Left atrium: No evidence of thrombus in the atrial cavity or   appendage. No evidence of thrombus in the atrial cavity or   appendage. - Right atrium: No evidence of thrombus in the atrial cavity or   appendage. - Atrial septum: There was a patent foramen ovale. - Tricuspid valve: No evidence of vegetation. - Pulmonic valve: No evidence of vegetation.  Impressions:  - Positive bubble study consistent with present PFO.   No thrombus in the left atrium or lefta trial appendage.  TCD: Summary: There was no evidence of high intensity transient signals (HITS) at rest, however, 15-20 HITS were observed with release of valsalva maneuver, suggestive of a small patent foramen ovale (PFO). Amended  ASSESSMENT AND PLAN: PFO with cryptogenic stroke: Radiographic results, hospital notes, lab results, and cardiac imaging data are reviewed. TEE images are reviewed and demonstrate a PFO with small right to left shunt demonstrated by agitated saline study.   The patient's Rope Score is 6, indicating a moderate/high (62%) probability that the patient's stroke is 'PFO-related.'  The patient is counseled about the association of PFO and cryptogenic stroke.  Available clinical trial data is reviewed, specifically those trials comparing transcatheter PFO closure and medical therapy with antiplatelet drugs. The patient understands the potential benefit of PFO closure with respect to secondary stroke reduction compared with medical therapy alone. Specific risks of transcatheter PFO closure are reviewed with the patient. These risks include bleeding, infection, device embolization, stroke, cardiac perforation, tamponade, arrhythmia, MI, and late device erosion. He understands these serious risks occur at low incidence of < 1%.   One specific issue with his case is that his PFO is difficult to visualize on TEE. There clearly are bubbles crossing right to left. His transcranial Doppler study is also positive. The patient and his wife are counseled that I plan to carefully image the interatrial septum with intracardiac echo   at the time of planned PFO closure. They also understand that there is a small chance that the PFO may not be amenable to transcatheter closure based on the intracardiac echo findings. They agree to proceed as planned. As long as the PFO is large enough to close based on ICE evaluation, will proceed with transcatheter closure.  Current medicines are reviewed with the patient today.  The patient does not have concerns regarding medicines.  Labs/ tests ordered today include:  No orders of the defined types were placed in this encounter.  Disposition:   PFO closure to be scheduled  Signed, Dahlton Hinde, MD  07/23/2016 12:38 PM    [facility] [address], , [place]  [address] Phone: [phone]; Fax: [phone]  

## 2016-08-01 NOTE — Progress Notes (Signed)
AMPLATZER PFO INFORMED CONSENT    Subject Name: JEFFREN DOMBEK    Subject met inclusion and exclusion criteria. The informed consent form, and study requirements and expectations were reviewed with the subject and questions and concerns were addressed prior to the signing of the consent form. The subject verbalized understanding of the trial requirements . The subject agreed to participate in the AMPLATZER PFO trial and signed the informed consent.The informed consent was obtained prior to performance of any protocol-specific procedures for the subject. A copy of the informed consent was given to the subject and a copy was placed in the subject's medical record.       [place] L. Caniya Tagle 08/01/2016 11:00

## 2016-08-02 ENCOUNTER — Ambulatory Visit (HOSPITAL_BASED_OUTPATIENT_CLINIC_OR_DEPARTMENT_OTHER): Payer: 59

## 2016-08-02 ENCOUNTER — Encounter (HOSPITAL_COMMUNITY): Payer: Self-pay | Admitting: Cardiology

## 2016-08-02 DIAGNOSIS — I10 Essential (primary) hypertension: Secondary | ICD-10-CM

## 2016-08-02 DIAGNOSIS — I5030 Unspecified diastolic (congestive) heart failure: Secondary | ICD-10-CM

## 2016-08-02 DIAGNOSIS — Q211 Atrial septal defect: Secondary | ICD-10-CM | POA: Diagnosis not present

## 2016-08-02 LAB — ECHOCARDIOGRAM COMPLETE
Height: 68 in
WEIGHTICAEL: 2606.72 [oz_av]

## 2016-08-02 MED ORDER — AMLODIPINE BESYLATE 2.5 MG PO TABS: 2.5000 mg | ORAL_TABLET | Freq: Every day | ORAL | 6 refills | Status: DC

## 2016-08-02 MED ORDER — ASPIRIN EC 81 MG PO TBEC: 81.0000 mg | DELAYED_RELEASE_TABLET | Freq: Every day | ORAL | 6 refills | Status: DC

## 2016-08-02 MED ORDER — AMLODIPINE BESYLATE 5 MG PO TABS
2.5000 mg | ORAL_TABLET | Freq: Every day | ORAL | Status: DC
Start: 2016-08-02 — End: 2016-08-02
  Administered 2016-08-02: 2.5 mg via ORAL
  Filled 2016-08-02: qty 1

## 2016-08-02 NOTE — Progress Notes (Addendum)
Progress Note  Patient Name: Zachary Woods Date of Encounter: 08/02/2016  Primary Cardiologist: Dr. Excell Seltzerooper  Subjective   Feeling well. No chest pain, sob or palpitations.   Inpatient Medications    Scheduled Meds: . aspirin EC  81 mg Oral Daily  . atorvastatin  20 mg Oral q1800  . clopidogrel  75 mg Oral Daily  . pantoprazole  40 mg Oral Daily  . sodium chloride flush  3 mL Intravenous Q12H   Continuous Infusions: . sodium chloride     PRN Meds: sodium chloride, acetaminophen, ALPRAZolam, hydrALAZINE, ondansetron (ZOFRAN) IV, sodium chloride flush   Vital Signs    Vitals:   08/01/16 2300 08/02/16 0127 08/02/16 0200 08/02/16 0711  BP: (!) 167/84 (!) 161/101 (!) 168/95 (!) 159/101  Pulse: (!) 104 89 89 93  Resp: 17 13 14  (!) 21  Temp:  98.2 F (36.8 C)  98.2 F (36.8 C)  TempSrc:  Oral  Oral  SpO2: 99% 100% 98% 95%  Weight:  162 lb 14.7 oz (73.9 kg)    Height:        Intake/Output Summary (Last 24 hours) at 08/02/16 0719 Last data filed at 08/02/16 0428  Gross per 24 hour  Intake          1402.55 ml  Output             2800 ml  Net         -1397.45 ml   Filed Weights   08/01/16 1015 08/01/16 1709 08/02/16 0127  Weight: 160 lb (72.6 kg) 160 lb 0.9 oz (72.6 kg) 162 lb 14.7 oz (73.9 kg)    Telemetry    NSR- Personally Reviewed  ECG    None today  Physical Exam   GEN: No acute distress.   Neck: No JVD Cardiac: RRR, no murmurs, rubs, or gallops. R groin cath site without hematoma.  Respiratory: Clear to auscultation bilaterally. GI: Soft, nontender, non-distended  MS: No edema; No deformity. Neuro:  Nonfocal  Psych: Normal affect   Labs    ChemistryNo results for input(s): NA, K, CL, CO2, GLUCOSE, BUN, CREATININE, CALCIUM, PROT, ALBUMIN, AST, ALT, ALKPHOS, BILITOT, GFRNONAA, GFRAA, ANIONGAP in the last 168 hours.   HematologyNo results for input(s): WBC, RBC, HGB, HCT, MCV, MCH, MCHC, RDW, PLT in the last 168 hours.  Cardiac EnzymesNo results  for input(s): TROPONINI in the last 168 hours. No results for input(s): TROPIPOC in the last 168 hours.   BNPNo results for input(s): BNP, PROBNP in the last 168 hours.   DDimer No results for input(s): DDIMER in the last 168 hours.   Radiology    No results found.  Cardiac Studies   Patent [place](PFO) Closure  Conclusion   Successful transcatheter PFO closure using ultrasound and fluoroscopic guidance (25 mm Amplatzer PFO Occluder)  Right Heart   PFO/ASD There is a PFO. There is not an atrial septal aneurysm present. Agitated saline study indicates right-to-left shunt at rest. Balloon sizing was not performed. Intracardiac echo used for procedural guidance. 25 mm Amplatzer PFO occluder was used to close the defect. There were no immediate procedural complications.          Patient Profile     51 y.o. male with hx of HTN (not on any medications) and recently admitted with a left MCA territory stroke that occurred in an embolic pattern evaluated by Dr. Excell Seltzerooper for PFO closer consideration.   Assessment & Plan    1. PFO with cryptogenic  stroke:  - S/p Successful transcatheter PFO closure using ultrasound and fluoroscopic guidance (25 mm Amplatzer PFO Occluder) - Continue ASA and Plavix. Echo today, if normal--> discharge home.  - He will need SBE prophylaxis for first 6 month.  - Follow up with APP in 1 week and Dr. Excell Seltzer in 1 month with echo.   2. HTN - Currently not on any regimen. Previously on Norvasc. Stopped as BP normalized after he quit smoking and lost weight. BP running high. Will start Norvasc 2.5mg  qd. Uptitrate as needed.   Signed, Manson Passey, PA  08/02/2016, 7:19 AM    Personally seen and examined. Agree with above.  51 year old PFO closed, 25mm Amplatzer.   - ASA, Plavix  - Await ECHO  - NSR, RRR, CTAB, no edema  Essential HTN  - starting back norvasc 2.5  Close follow up.  ECHO reassuring, normal EF, normal ASD device signal,  hypokinesis of basal inferior wall, OK for DC.   Donato Schultz, MD

## 2016-08-02 NOTE — Discharge Summary (Signed)
Discharge Summary    Patient ID: Zachary Woods,  MRN: [id number], DOB/AGE: [id number] 50 y.o.  Admit date: 08/01/2016 Discharge date: 08/02/2016  Primary Care Provider: Patient, No Pcp Per Primary Cardiologist: Dr. Excell Seltzer  Discharge Diagnoses    Active Problems:   PFO (patent foramen ovale)   HTN   Recent left MCA territory stroke  Allergies Allergies  Allergen Reactions  . Penicillins Itching    Has patient had a PCN reaction causing immediate rash, facial/tongue/throat swelling, SOB or lightheadedness with hypotension: No Has patient had a PCN reaction causing severe rash involving mucus membranes or skin necrosis: No Has patient had a PCN reaction that required hospitalization: No Has patient had a PCN reaction occurring within the last 10 years: Yes If all of the above answers are "NO", then may proceed with Cephalosporin use.    Diagnostic Studies/Procedures    Patent [place](PFO) Closure  Conclusion   Successful transcatheter PFO closure using ultrasound and fluoroscopic guidance (25 mm Amplatzer PFO Occluder)  Right Heart   PFO/ASD There is a PFO. There is not an atrial septal aneurysm present. Agitated saline study indicates right-to-left shunt at rest. Balloon sizing was not performed. Intracardiac echo used for procedural guidance. 25 mm Amplatzer PFO occluder was used to close the defect. There were no immediate procedural complications.        Echo 08/02/16 Study Conclusions  - Left ventricle: The cavity size was normal. Systolic function was   normal. The estimated ejection fraction was in the range of 55%   to 60%. There is hypokinesis of the basalinferior myocardium.   Doppler parameters are consistent with abnormal left ventricular   relaxation (grade 1 diastolic dysfunction). - Atrial septum: An Amplatzer closure device was present. 25mm,   intact in proper position. No Doppler shunt signal.    History of Present Illness     51 y.o.  male with hx of HTN (not on any medications) and recently admitted with a left MCA territory stroke that occurred in an embolic pattern evaluated by Dr. Excell Seltzer for PFO closer consideration.   Hospital Course     Consultants: None  1. PFO with cryptogenic stroke:  - S/p Successful transcatheter PFO closure using ultrasound and fluoroscopic guidance (25 mm Amplatzer PFO Occluder) - Continue ASA and Plavix. Echo showed no shunt or effusion. He will need SBE prophylaxis for first 6 month.  Follow up with APP in 1 week and Dr. Excell Seltzer in 1 month with echo.   2. HTN - Currently not on any regimen. Previously on Norvasc. Stopped as BP normalized after he quit smoking and lost weight. BP running high. Will start Norvasc 2.5mg  qd. Uptitrate as needed.   The patient has been seen by Dr. Anne Fu today and deemed ready for discharge home. All follow-up appointments have been scheduled. Discharge medications are listed below.  _____________   Discharge Vitals Blood pressure (!) 159/101, pulse 93, temperature 98.2 F (36.8 C), temperature source Oral, resp. rate (!) 21, height 5\' 8"  (1.727 m), weight 162 lb 14.7 oz (73.9 kg), SpO2 95 %.  Filed Weights   08/01/16 1015 08/01/16 1709 08/02/16 0127  Weight: 160 lb (72.6 kg) 160 lb 0.9 oz (72.6 kg) 162 lb 14.7 oz (73.9 kg)    Labs & Radiologic Studies     CBC No results for input(s): WBC, NEUTROABS, HGB, HCT, MCV, PLT in the last 72 hours. Basic Metabolic Panel No results for input(s): NA, K, CL, CO2, GLUCOSE, BUN,  CREATININE, CALCIUM, MG, PHOS in the last 72 hours. Liver Function Tests No results for input(s): AST, ALT, ALKPHOS, BILITOT, PROT, ALBUMIN in the last 72 hours. No results for input(s): LIPASE, AMYLASE in the last 72 hours. Cardiac Enzymes No results for input(s): CKTOTAL, CKMB, CKMBINDEX, TROPONINI in the last 72 hours. BNP Invalid input(s): POCBNP D-Dimer No results for input(s): DDIMER in the last 72 hours. Hemoglobin A1C No  results for input(s): HGBA1C in the last 72 hours. Fasting Lipid Panel No results for input(s): CHOL, HDL, LDLCALC, TRIG, CHOLHDL, LDLDIRECT in the last 72 hours. Thyroid Function Tests No results for input(s): TSH, T4TOTAL, T3FREE, THYROIDAB in the last 72 hours.  Invalid input(s): FREET3  Dg Chest 2 View  Result Date: 07/19/2016 CLINICAL DATA:  Blurred vision, slurred speech, right arm numbness since yesterday knee in. Headaches over the last week. EXAM: CHEST  2 VIEW COMPARISON:  None. FINDINGS: Normal heart size. Lungs clear. No pneumothorax. No pleural effusion. Mid level thoracic compression deformity has a chronic appearance. IMPRESSION: No active cardiopulmonary disease. Electronically Signed   By: Jolaine ClickArthur  Hoss M.D.   On: 07/19/2016 13:06   Ct Head Wo Contrast  Result Date: 07/19/2016 CLINICAL DATA:  Slurred speech and numbness in right arm. EXAM: CT HEAD WITHOUT CONTRAST TECHNIQUE: Contiguous axial images were obtained from the base of the skull through the vertex without intravenous contrast. COMPARISON:  None. FINDINGS: Brain: No evidence of acute infarction, hemorrhage, hydrocephalus, extra-axial collection or mass lesion/mass effect. Vascular: No hyperdense vessel or unexpected calcification. Skull: Normal. Negative for fracture or focal lesion. Sinuses/Orbits: No acute finding. Other: None. IMPRESSION: No cause for the patient's symptoms identified. No acute intracranial abnormality. Electronically Signed   By: Gerome Samavid  Williams III M.D   On: 07/19/2016 11:38   Ct Chest W Contrast  Result Date: 07/20/2016 CLINICAL DATA:  Weight loss. EXAM: CT CHEST, ABDOMEN, AND PELVIS WITH CONTRAST TECHNIQUE: Multidetector CT imaging of the chest, abdomen and pelvis was performed following the standard protocol during bolus administration of intravenous contrast. CONTRAST:  100mL ISOVUE-300 IOPAMIDOL (ISOVUE-300) INJECTION 61% COMPARISON:  None. FINDINGS: CT CHEST FINDINGS Cardiovascular: Normal  caliber thoracic aorta with mild atherosclerosis. The heart is normal in size. There are coronary artery calcifications. No pericardial effusion. Mediastinum/Nodes: No mediastinal, hilar, or axillary adenopathy. The esophagus is decompressed. Visualized thyroid gland is normal. Lungs/Pleura: Clear lungs. No consolidation, pulmonary edema or pleural fluid. No pulmonary mass or suspicious nodule. Musculoskeletal: There are no acute or suspicious osseous abnormalities. Degenerative change in the spine. CT ABDOMEN PELVIS FINDINGS Hepatobiliary: Equivocal 10 mm hypodensity in the left lobe of the liver, may simply be focal fatty infiltration Gallbladder physiologically distended, no calcified stone. No biliary dilatation. Pancreas: No ductal dilatation or inflammation. Spleen: Normal in size without focal abnormality. Adrenals/Urinary Tract: No adrenal nodule. No renal mass. No hydronephrosis or perinephric edema. Homogeneous enhancement and symmetric excretion on delayed phase imaging. Stomach/Bowel: Stomach is within normal limits. Appendix appears normal. No evidence of bowel wall thickening, distention, or inflammatory changes. Colonic diverticulosis is most prominent in the distal descending and sigmoid colon, no acute inflammation. Vascular/Lymphatic: Aortic atherosclerosis. No enlarged abdominal or pelvic lymph nodes. Reproductive: Prostate is unremarkable. Other: No free air, free fluid, or intra-abdominal fluid collection. Scattered air in the subcutaneous tissues of the lower anterior abdominal wall likely secondary to injections. Musculoskeletal: There are no acute or suspicious osseous abnormalities. Degenerative change in the spine. IMPRESSION: 1. No explanation for weight loss. No evidence of malignancy in the chest,  abdomen, or pelvis. 2. Questionable 10 mm hypodense lesion in the left lobe of the liver may simply be focal fatty infiltration. In a low risk patient with no known malignancy, hepatic  dysfunction, hepatic risk factors or symptoms attributable to the liver, no further follow-up is needed. 3. Thoracoabdominal aortic atherosclerosis. Scattered coronary artery calcifications. Electronically Signed   By: Rubye Oaks M.D.   On: 07/20/2016 22:00   Mr Angiogram Neck W Contrast  Result Date: 07/19/2016 CLINICAL DATA:  Slurred speech and RIGHT sided numbness and tingling beginning yesterday at noon, word-finding difficulties. Symptoms predominately resolved. History of hypertension. EXAM: MRI HEAD WITHOUT CONTRAST MRA HEAD WITHOUT CONTRAST MRA NECK WITHOUT AND WITH CONTRAST TECHNIQUE: Multiplanar, multiecho pulse sequences of the brain and surrounding structures were obtained without intravenous contrast. Angiographic images of the Circle of Willis were obtained using MRA technique without intravenous contrast. Angiographic images of the neck were obtained using MRA technique without and with intravenous contrast. Carotid stenosis measurements (when applicable) are obtained utilizing NASCET criteria, using the distal internal carotid diameter as the denominator. CONTRAST:  15mL MULTIHANCE GADOBENATE DIMEGLUMINE 529 MG/ML IV SOLN COMPARISON:  CT HEAD Jul 19, 2016 at 1126 hours FINDINGS: MRI HEAD FINDINGS- mildly motion degraded examination. BRAIN: Multiple subcentimeter meter foci of reduced diffusion and FLAIR T2 hyperintense signal LEFT frontal lobe, with low ADC values. Subcentimeter linear reduced diffusion LEFT periventricular caudate tail. No susceptibility artifact to suggest hemorrhage. The ventricles and sulci are normal for patient's age. No suspicious parenchymal signal, masses or mass effect. No abnormal extra-axial fluid collections. VASCULAR: Normal major intracranial vascular flow voids present at skull base. SKULL AND UPPER CERVICAL SPINE: No abnormal sellar expansion. No suspicious calvarial bone marrow signal. Craniocervical junction maintained. SINUSES/ORBITS: LEFT maxillary  mucosal retention cysts. Mastoid air cells are well aerated. The included ocular globes and orbital contents are non-suspicious. OTHER: Patient is edentulous. MRA HEAD FINDINGS- mildly motion degraded examination. ANTERIOR CIRCULATION: Normal flow related enhancement of the included cervical, petrous, cavernous and supraclinoid internal carotid arteries. Patent anterior communicating artery. Normal flow related enhancement of the anterior and middle cerebral arteries, including distal segments. Mild luminal irregularity LEFT M1 segment. Focal moderate stenosis LEFT M2 inferior segment origin. No large vessel occlusion, high-grade stenosis, aneurysm. POSTERIOR CIRCULATION: LEFT vertebral artery is dominant. Basilar artery is patent, with normal flow related enhancement of the main branch vessels. Normal flow related enhancement of the posterior cerebral arteries. No large vessel occlusion, high-grade stenosis, abnormal luminal irregularity, aneurysm. ANATOMIC VARIANTS: None. Source images and MIP images were reviewed. MRA NECK FINDINGS ANTERIOR CIRCULATION: The common carotid arteries are widely patent bilaterally. The carotid bifurcations are patent bilaterally without hemodynamically significant stenosis by NASCET criteria. No flow limiting stenosis or luminal irregularity. POSTERIOR CIRCULATION: Bilateral vertebral arteries are patent to the vertebrobasilar junction. No flow limiting stenosis or luminal irregularity. Source images and MIP images were reviewed. IMPRESSION: MRI HEAD: Acute subcentimeter LEFT MCA territory infarcts most compatible with embolic etiology. Otherwise negative mildly motion degraded noncontrast MRI head. MRA HEAD: No emergent large vessel occlusion. Mild LEFT M1 luminal regularity and moderate stenosis LEFT M2 origin secondary to atherosclerosis, thromboembolism or motion artifact. MRA NECK: No hemodynamically significant stenosis or acute vascular process. Electronically Signed   By:  Awilda Metro M.D.   On: 07/19/2016 16:26   Mr Brain Wo Contrast  Result Date: 07/19/2016 CLINICAL DATA:  Slurred speech and RIGHT sided numbness and tingling beginning yesterday at noon, word-finding difficulties. Symptoms predominately resolved. History of hypertension. EXAM:  MRI HEAD WITHOUT CONTRAST MRA HEAD WITHOUT CONTRAST MRA NECK WITHOUT AND WITH CONTRAST TECHNIQUE: Multiplanar, multiecho pulse sequences of the brain and surrounding structures were obtained without intravenous contrast. Angiographic images of the Circle of Willis were obtained using MRA technique without intravenous contrast. Angiographic images of the neck were obtained using MRA technique without and with intravenous contrast. Carotid stenosis measurements (when applicable) are obtained utilizing NASCET criteria, using the distal internal carotid diameter as the denominator. CONTRAST:  15mL MULTIHANCE GADOBENATE DIMEGLUMINE 529 MG/ML IV SOLN COMPARISON:  CT HEAD Jul 19, 2016 at 1126 hours FINDINGS: MRI HEAD FINDINGS- mildly motion degraded examination. BRAIN: Multiple subcentimeter meter foci of reduced diffusion and FLAIR T2 hyperintense signal LEFT frontal lobe, with low ADC values. Subcentimeter linear reduced diffusion LEFT periventricular caudate tail. No susceptibility artifact to suggest hemorrhage. The ventricles and sulci are normal for patient's age. No suspicious parenchymal signal, masses or mass effect. No abnormal extra-axial fluid collections. VASCULAR: Normal major intracranial vascular flow voids present at skull base. SKULL AND UPPER CERVICAL SPINE: No abnormal sellar expansion. No suspicious calvarial bone marrow signal. Craniocervical junction maintained. SINUSES/ORBITS: LEFT maxillary mucosal retention cysts. Mastoid air cells are well aerated. The included ocular globes and orbital contents are non-suspicious. OTHER: Patient is edentulous. MRA HEAD FINDINGS- mildly motion degraded examination. ANTERIOR  CIRCULATION: Normal flow related enhancement of the included cervical, petrous, cavernous and supraclinoid internal carotid arteries. Patent anterior communicating artery. Normal flow related enhancement of the anterior and middle cerebral arteries, including distal segments. Mild luminal irregularity LEFT M1 segment. Focal moderate stenosis LEFT M2 inferior segment origin. No large vessel occlusion, high-grade stenosis, aneurysm. POSTERIOR CIRCULATION: LEFT vertebral artery is dominant. Basilar artery is patent, with normal flow related enhancement of the main branch vessels. Normal flow related enhancement of the posterior cerebral arteries. No large vessel occlusion, high-grade stenosis, abnormal luminal irregularity, aneurysm. ANATOMIC VARIANTS: None. Source images and MIP images were reviewed. MRA NECK FINDINGS ANTERIOR CIRCULATION: The common carotid arteries are widely patent bilaterally. The carotid bifurcations are patent bilaterally without hemodynamically significant stenosis by NASCET criteria. No flow limiting stenosis or luminal irregularity. POSTERIOR CIRCULATION: Bilateral vertebral arteries are patent to the vertebrobasilar junction. No flow limiting stenosis or luminal irregularity. Source images and MIP images were reviewed. IMPRESSION: MRI HEAD: Acute subcentimeter LEFT MCA territory infarcts most compatible with embolic etiology. Otherwise negative mildly motion degraded noncontrast MRI head. MRA HEAD: No emergent large vessel occlusion. Mild LEFT M1 luminal regularity and moderate stenosis LEFT M2 origin secondary to atherosclerosis, thromboembolism or motion artifact. MRA NECK: No hemodynamically significant stenosis or acute vascular process. Electronically Signed   By: Awilda Metro M.D.   On: 07/19/2016 16:26   Ct Abdomen Pelvis W Contrast  Result Date: 07/20/2016 CLINICAL DATA:  Weight loss. EXAM: CT CHEST, ABDOMEN, AND PELVIS WITH CONTRAST TECHNIQUE: Multidetector CT imaging of  the chest, abdomen and pelvis was performed following the standard protocol during bolus administration of intravenous contrast. CONTRAST:  ISOVUE-300 IOPAMIDOL (ISOVUE-300) INJECTION 61% COMPARISON:  None. FINDINGS: CT CHEST FINDINGS Cardiovascular: Normal caliber thoracic aorta with mild atherosclerosis. The heart is normal in size. There are coronary artery calcifications. No pericardial effusion. Mediastinum/Nodes: No mediastinal, hilar, or axillary adenopathy. The esophagus is decompressed. Visualized thyroid gland is normal. Lungs/Pleura: Clear lungs. No consolidation, pulmonary edema or pleural fluid. No pulmonary mass or suspicious nodule. Musculoskeletal: There are no acute or suspicious osseous abnormalities. Degenerative change in the spine. CT ABDOMEN PELVIS FINDINGS Hepatobiliary: Equivocal 10 mm hypodensity in  the left lobe of the liver, may simply be focal fatty infiltration Gallbladder physiologically distended, no calcified stone. No biliary dilatation. Pancreas: No ductal dilatation or inflammation. Spleen: Normal in size without focal abnormality. Adrenals/Urinary Tract: No adrenal nodule. No renal mass. No hydronephrosis or perinephric edema. Homogeneous enhancement and symmetric excretion on delayed phase imaging. Stomach/Bowel: Stomach is within normal limits. Appendix appears normal. No evidence of bowel wall thickening, distention, or inflammatory changes. Colonic diverticulosis is most prominent in the distal descending and sigmoid colon, no acute inflammation. Vascular/Lymphatic: Aortic atherosclerosis. No enlarged abdominal or pelvic lymph nodes. Reproductive: Prostate is unremarkable. Other: No free air, free fluid, or intra-abdominal fluid collection. Scattered air in the subcutaneous tissues of the lower anterior abdominal wall likely secondary to injections. Musculoskeletal: There are no acute or suspicious osseous abnormalities. Degenerative change in the spine. IMPRESSION: 1.  No explanation for weight loss. No evidence of malignancy in the chest, abdomen, or pelvis. 2. Questionable 10 mm hypodense lesion in the left lobe of the liver may simply be focal fatty infiltration. In a low risk patient with no known malignancy, hepatic dysfunction, hepatic risk factors or symptoms attributable to the liver, no further follow-up is needed. 3. Thoracoabdominal aortic atherosclerosis. Scattered coronary artery calcifications. Electronically Signed   By: Rubye Oaks M.D.   On: 07/20/2016 22:00   Mr Maxine Glenn Head/brain ZO Cm  Result Date: 07/19/2016 CLINICAL DATA:  Slurred speech and RIGHT sided numbness and tingling beginning yesterday at noon, word-finding difficulties. Symptoms predominately resolved. History of hypertension. EXAM: MRI HEAD WITHOUT CONTRAST MRA HEAD WITHOUT CONTRAST MRA NECK WITHOUT AND WITH CONTRAST TECHNIQUE: Multiplanar, multiecho pulse sequences of the brain and surrounding structures were obtained without intravenous contrast. Angiographic images of the Circle of Willis were obtained using MRA technique without intravenous contrast. Angiographic images of the neck were obtained using MRA technique without and with intravenous contrast. Carotid stenosis measurements (when applicable) are obtained utilizing NASCET criteria, using the distal internal carotid diameter as the denominator. CONTRAST:  15mL MULTIHANCE GADOBENATE DIMEGLUMINE 529 MG/ML IV SOLN COMPARISON:  CT HEAD Jul 19, 2016 at 1126 hours FINDINGS: MRI HEAD FINDINGS- mildly motion degraded examination. BRAIN: Multiple subcentimeter meter foci of reduced diffusion and FLAIR T2 hyperintense signal LEFT frontal lobe, with low ADC values. Subcentimeter linear reduced diffusion LEFT periventricular caudate tail. No susceptibility artifact to suggest hemorrhage. The ventricles and sulci are normal for patient's age. No suspicious parenchymal signal, masses or mass effect. No abnormal extra-axial fluid collections.  VASCULAR: Normal major intracranial vascular flow voids present at skull base. SKULL AND UPPER CERVICAL SPINE: No abnormal sellar expansion. No suspicious calvarial bone marrow signal. Craniocervical junction maintained. SINUSES/ORBITS: LEFT maxillary mucosal retention cysts. Mastoid air cells are well aerated. The included ocular globes and orbital contents are non-suspicious. OTHER: Patient is edentulous. MRA HEAD FINDINGS- mildly motion degraded examination. ANTERIOR CIRCULATION: Normal flow related enhancement of the included cervical, petrous, cavernous and supraclinoid internal carotid arteries. Patent anterior communicating artery. Normal flow related enhancement of the anterior and middle cerebral arteries, including distal segments. Mild luminal irregularity LEFT M1 segment. Focal moderate stenosis LEFT M2 inferior segment origin. No large vessel occlusion, high-grade stenosis, aneurysm. POSTERIOR CIRCULATION: LEFT vertebral artery is dominant. Basilar artery is patent, with normal flow related enhancement of the main branch vessels. Normal flow related enhancement of the posterior cerebral arteries. No large vessel occlusion, high-grade stenosis, abnormal luminal irregularity, aneurysm. ANATOMIC VARIANTS: None. Source images and MIP images were reviewed. MRA NECK FINDINGS ANTERIOR CIRCULATION: The  common carotid arteries are widely patent bilaterally. The carotid bifurcations are patent bilaterally without hemodynamically significant stenosis by NASCET criteria. No flow limiting stenosis or luminal irregularity. POSTERIOR CIRCULATION: Bilateral vertebral arteries are patent to the vertebrobasilar junction. No flow limiting stenosis or luminal irregularity. Source images and MIP images were reviewed. IMPRESSION: MRI HEAD: Acute subcentimeter LEFT MCA territory infarcts most compatible with embolic etiology. Otherwise negative mildly motion degraded noncontrast MRI head. MRA HEAD: No emergent large vessel  occlusion. Mild LEFT M1 luminal regularity and moderate stenosis LEFT M2 origin secondary to atherosclerosis, thromboembolism or motion artifact. MRA NECK: No hemodynamically significant stenosis or acute vascular process. Electronically Signed   By: Awilda Metro M.D.   On: 07/19/2016 16:26    Disposition   Pt is being discharged home today in good condition.  Follow-up Plans & Appointments     Discharge Instructions    Diet - low sodium heart healthy    Complete by:  As directed    Discharge instructions    Complete by:  As directed    No driving for 48 hours. No lifting over 5 lbs for 1 week. No sexual activity for 1 week. You may return to work on 08/06/16. Keep procedure site clean & dry. If you notice increased pain, swelling, bleeding or pus, call/return!  You may shower, but no soaking baths/hot tubs/pools for 1 week.   Increase activity slowly    Complete by:  As directed       Discharge Medications   Current Discharge Medication List    START taking these medications   Details  amLODipine (NORVASC) 2.5 MG tablet Take 1 tablet (2.5 mg total) by mouth daily. Qty: 30 tablet, Refills: 6      CONTINUE these medications which have CHANGED   Details  aspirin 81 MG tablet Take 1 tablet (81 mg total) by mouth daily. Qty: 30 tablet, Refills: 6      CONTINUE these medications which have NOT CHANGED   Details  atorvastatin (LIPITOR) 20 MG tablet Take 1 tablet (20 mg total) by mouth daily at 6 PM. Qty: 30 tablet, Refills: 1    clopidogrel (PLAVIX) 75 MG tablet Take 1 tablet (75 mg total) by mouth daily. Qty: 30 tablet, Refills: 6    Multiple Vitamin (MULTIVITAMIN WITH MINERALS) TABS tablet Take 1 tablet by mouth daily.    pantoprazole (PROTONIX) 40 MG tablet Take 1 tablet (40 mg total) by mouth daily. Qty: 30 tablet, Refills: 6    tetrahydrozoline 0.05 % ophthalmic solution Place 2 drops into both eyes 2 (two) times daily as needed (dry eyes). Visine              Outstanding Labs/Studies   Echo in 1 month  Duration of Discharge Encounter   Greater than 30 minutes including physician time.  Signed, Bhagat,Bhavinkumar PA-C 08/02/2016, 9:44 AM  Personally seen and examined. Agree with above.  51 year old PFO closed, 25mm Amplatzer.   - ASA, Plavix  - Await ECHO  - NSR, RRR, CTAB, no edema  Essential HTN  - starting back norvasc 2.5  Close follow up.  ECHO reassuring, normal EF, normal ASD device signal, hypokinesis of basal inferior wall, OK for DC.   Donato Schultz, MD

## 2016-08-04 ENCOUNTER — Encounter (HOSPITAL_COMMUNITY): Payer: Self-pay | Admitting: Cardiovascular Disease

## 2016-08-06 ENCOUNTER — Telehealth: Payer: Self-pay | Admitting: *Deleted

## 2016-08-06 NOTE — Telephone Encounter (Signed)
Called Zachary Woods to inform him of the change to his appointment.

## 2016-08-22 ENCOUNTER — Ambulatory Visit: Payer: 59 | Admitting: Physician Assistant

## 2016-08-25 ENCOUNTER — Ambulatory Visit (INDEPENDENT_AMBULATORY_CARE_PROVIDER_SITE_OTHER): Payer: 59 | Admitting: Family Medicine

## 2016-08-25 ENCOUNTER — Encounter: Payer: Self-pay | Admitting: Family Medicine

## 2016-08-25 VITALS — BP 148/90 | HR 92 | Temp 98.8°F | Resp 16 | Ht 67.44 in | Wt 162.2 lb

## 2016-08-25 DIAGNOSIS — Z5181 Encounter for therapeutic drug level monitoring: Secondary | ICD-10-CM | POA: Diagnosis not present

## 2016-08-25 DIAGNOSIS — R208 Other disturbances of skin sensation: Secondary | ICD-10-CM

## 2016-08-25 DIAGNOSIS — K219 Gastro-esophageal reflux disease without esophagitis: Secondary | ICD-10-CM | POA: Diagnosis not present

## 2016-08-25 DIAGNOSIS — K769 Liver disease, unspecified: Secondary | ICD-10-CM

## 2016-08-25 DIAGNOSIS — Z79899 Other long term (current) drug therapy: Secondary | ICD-10-CM | POA: Diagnosis not present

## 2016-08-25 DIAGNOSIS — I10 Essential (primary) hypertension: Secondary | ICD-10-CM

## 2016-08-25 DIAGNOSIS — I6310 Cerebral infarction due to embolism of unspecified precerebral artery: Secondary | ICD-10-CM | POA: Diagnosis not present

## 2016-08-25 MED ORDER — AMLODIPINE BESYLATE 5 MG PO TABS: 5.0000 mg | ORAL_TABLET | Freq: Every day | ORAL | 1 refills | Status: DC

## 2016-08-25 MED ORDER — ATORVASTATIN CALCIUM 20 MG PO TABS: 20.0000 mg | ORAL_TABLET | Freq: Every day | ORAL | 1 refills | Status: DC

## 2016-08-25 NOTE — Patient Instructions (Addendum)
Try every other day dosing to see if that still controls heartburn. If you do need that med every day, then will need to continue to monitor your magnesium and B12. I will check those levels today. See foods to avoid below.  Increase Norvasc to 5mg  once per day. Keep a record of your blood pressures outside of the office and bring them to the next office visit.  Continue to avoid alcohol, or cut back and plan rechecking liver test and imaging of liver in next 3 months.   For numbness in toes, I will check some labwork, but if that continues or worsens, return to discuss further.  Schedule physical in the next 3 months and we can review other health maintenance items and cancer screening at that time.   Food Choices for Gastroesophageal Reflux Disease, Adult When you have gastroesophageal reflux disease (GERD), the foods you eat and your eating habits are very important. Choosing the right foods can help ease the discomfort of GERD. Consider working with a diet and nutrition specialist (dietitian) to help you make healthy food choices. What general guidelines should I follow? Eating plan  Choose healthy foods low in fat, such as fruits, vegetables, whole grains, low-fat dairy products, and lean meat, fish, and poultry.  Eat frequent, small meals instead of three large meals each day. Eat your meals slowly, in a relaxed setting. Avoid bending over or lying down until 2-3 hours after eating.  Limit high-fat foods such as fatty meats or fried foods.  Limit your intake of oils, butter, and shortening to less than 8 teaspoons each day.  Avoid the following: ? Foods that cause symptoms. These may be different for different people. Keep a food diary to keep track of foods that cause symptoms. ? Alcohol. ? Drinking large amounts of liquid with meals. ? Eating meals during the 2-3 hours before bed.  Cook foods using methods other than frying. This may include baking, grilling, or  broiling. Lifestyle   Maintain a healthy weight. Ask your health care provider what weight is healthy for you. If you need to lose weight, work with your health care provider to do so safely.  Exercise for at least 30 minutes on 5 or more days each week, or as told by your health care provider.  Avoid wearing clothes that fit tightly around your waist and chest.  Do not use any products that contain nicotine or tobacco, such as cigarettes and e-cigarettes. If you need help quitting, ask your health care provider.  Sleep with the head of your bed raised. Use a wedge under the mattress or blocks under the bed frame to raise the head of the bed. What foods are not recommended? The items listed may not be a complete list. Talk with your dietitian about what dietary choices are best for you. Grains Pastries or quick breads with added fat. [place] toast. Vegetables Deep fried vegetables. [place] fries. Any vegetables prepared with added fat. Any vegetables that cause symptoms. For some people this may include tomatoes and tomato products, chili peppers, onions and garlic, and horseradish. Fruits Any fruits prepared with added fat. Any fruits that cause symptoms. For some people this may include citrus fruits, such as oranges, grapefruit, pineapple, and lemons. Meats and other protein foods High-fat meats, such as fatty beef or pork, hot dogs, ribs, ham, sausage, salami and bacon. Fried meat or protein, including fried fish and fried chicken. Nuts and nut butters. Dairy Whole milk and chocolate milk. Sour cream.  Cream. Ice cream. Cream cheese. Milk shakes. Beverages Coffee and tea, with or without caffeine. Carbonated beverages. Sodas. Energy drinks. Fruit juice made with acidic fruits (such as orange or grapefruit). Tomato juice. Alcoholic drinks. Fats and oils Butter. Margarine. Shortening. Ghee. Sweets and desserts Chocolate and cocoa. Donuts. Seasoning and other foods Pepper. Peppermint  and spearmint. Any condiments, herbs, or seasonings that cause symptoms. For some people, this may include curry, hot sauce, or vinegar-based salad dressings. Summary  When you have gastroesophageal reflux disease (GERD), food and lifestyle choices are very important to help ease the discomfort of GERD.  Eat frequent, small meals instead of three large meals each day. Eat your meals slowly, in a relaxed setting. Avoid bending over or lying down until 2-3 hours after eating.  Limit high-fat foods such as fatty meat or fried foods. This information is not intended to replace advice given to you by your health care provider. Make sure you discuss any questions you have with your health care provider. Document Released: 02/17/2005 Document Revised: 02/19/2016 Document Reviewed: 02/19/2016 Elsevier Interactive Patient Education  2017 [employer]  Hypertension Hypertension, commonly called high blood pressure, is when the force of blood pumping through the arteries is too strong. The arteries are the blood vessels that carry blood from the heart throughout the body. Hypertension forces the heart to work harder to pump blood and may cause arteries to become narrow or stiff. Having untreated or uncontrolled hypertension can cause heart attacks, strokes, kidney disease, and other problems. A blood pressure reading consists of a higher number over a lower number. Ideally, your blood pressure should be below 120/80. The first ("top") number is called the systolic pressure. It is a measure of the pressure in your arteries as your heart beats. The second ("bottom") number is called the diastolic pressure. It is a measure of the pressure in your arteries as the heart relaxes. What are the causes? The cause of this condition is not known. What increases the risk? Some risk factors for high blood pressure are under your control. Others are not. Factors you can change  Smoking.  Having type 2 diabetes  mellitus, high cholesterol, or both.  Not getting enough exercise or physical activity.  Being overweight.  Having too much fat, sugar, calories, or salt (sodium) in your diet.  Drinking too much alcohol. Factors that are difficult or impossible to change  Having chronic kidney disease.  Having a family history of high blood pressure.  Age. Risk increases with age.  Race. You may be at higher risk if you are African-American.  Gender. Men are at higher risk than women before age 50. After age 50, women are at higher risk than men.  Having obstructive sleep apnea.  Stress. What are the signs or symptoms? Extremely high blood pressure (hypertensive crisis) may cause:  Headache.  Anxiety.  Shortness of breath.  Nosebleed.  Nausea and vomiting.  Severe chest pain.  Jerky movements you cannot control (seizures).  How is this diagnosed? This condition is diagnosed by measuring your blood pressure while you are seated, with your arm resting on a surface. The cuff of the blood pressure monitor will be placed directly against the skin of your upper arm at the level of your heart. It should be measured at least twice using the same arm. Certain conditions can cause a difference in blood pressure between your right and left arms. Certain factors can cause blood pressure readings to be lower or higher than normal (  elevated) for a short period of time:  When your blood pressure is higher when you are in a health care provider's office than when you are at home, this is called white coat hypertension. Most people with this condition do not need medicines.  When your blood pressure is higher at home than when you are in a health care provider's office, this is called masked hypertension. Most people with this condition may need medicines to control blood pressure.  If you have a high blood pressure reading during one visit or you have normal blood pressure with other risk  factors:  You may be asked to return on a different day to have your blood pressure checked again.  You may be asked to monitor your blood pressure at home for 1 week or longer.  If you are diagnosed with hypertension, you may have other blood or imaging tests to help your health care provider understand your overall risk for other conditions. How is this treated? This condition is treated by making healthy lifestyle changes, such as eating healthy foods, exercising more, and reducing your alcohol intake. Your health care provider may prescribe medicine if lifestyle changes are not enough to get your blood pressure under control, and if:  Your systolic blood pressure is above 130.  Your diastolic blood pressure is above 80.  Your personal target blood pressure may vary depending on your medical conditions, your age, and other factors. Follow these instructions at home: Eating and drinking  Eat a diet that is high in fiber and potassium, and low in sodium, added sugar, and fat. An example eating plan is called the DASH (Dietary Approaches to Stop Hypertension) diet. To eat this way: ? Eat plenty of fresh fruits and vegetables. Try to fill half of your plate at each meal with fruits and vegetables. ? Eat whole grains, such as whole wheat pasta, brown rice, or whole grain bread. Fill about one quarter of your plate with whole grains. ? Eat or drink low-fat dairy products, such as skim milk or low-fat yogurt. ? Avoid fatty cuts of meat, processed or cured meats, and poultry with skin. Fill about one quarter of your plate with lean proteins, such as fish, chicken without skin, beans, eggs, and tofu. ? Avoid premade and processed foods. These tend to be higher in sodium, added sugar, and fat.  Reduce your daily sodium intake. Most people with hypertension should eat less than 1,500 mg of sodium a day.  Limit alcohol intake to no more than 1 drink a day for nonpregnant women and 2 drinks a day  for men. One drink equals 12 oz of beer, 5 oz of wine, or 1 oz of hard liquor. Lifestyle  Work with your health care provider to maintain a healthy body weight or to lose weight. Ask what an ideal weight is for you.  Get at least 30 minutes of exercise that causes your heart to beat faster (aerobic exercise) most days of the week. Activities may include walking, swimming, or biking.  Include exercise to strengthen your muscles (resistance exercise), such as pilates or lifting weights, as part of your weekly exercise routine. Try to do these types of exercises for 30 minutes at least 3 days a week.  Do not use any products that contain nicotine or tobacco, such as cigarettes and e-cigarettes. If you need help quitting, ask your health care provider.  Monitor your blood pressure at home as told by your health care provider.  Keep all  follow-up visits as told by your health care provider. This is important. Medicines  Take over-the-counter and prescription medicines only as told by your health care provider. Follow directions carefully. Blood pressure medicines must be taken as prescribed.  Do not skip doses of blood pressure medicine. Doing this puts you at risk for problems and can make the medicine less effective.  Ask your health care provider about side effects or reactions to medicines that you should watch for. Contact a health care provider if:  You think you are having a reaction to a medicine you are taking.  You have headaches that keep coming back (recurring).  You feel dizzy.  You have swelling in your ankles.  You have trouble with your vision. Get help right away if:  You develop a severe headache or confusion.  You have unusual weakness or numbness.  You feel faint.  You have severe pain in your chest or abdomen.  You vomit repeatedly.  You have trouble breathing. Summary  Hypertension is when the force of blood pumping through your arteries is too strong.  If this condition is not controlled, it may put you at risk for serious complications.  Your personal target blood pressure may vary depending on your medical conditions, your age, and other factors. For most people, a normal blood pressure is less than 120/80.  Hypertension is treated with lifestyle changes, medicines, or a combination of both. Lifestyle changes include weight loss, eating a healthy, low-sodium diet, exercising more, and limiting alcohol. This information is not intended to replace advice given to you by your health care provider. Make sure you discuss any questions you have with your health care provider. Document Released: 02/17/2005 Document Revised: 01/16/2016 Document Reviewed: 01/16/2016 Elsevier Interactive Patient Education  2018 [employer].    IF you received an x-ray today, you will receive an invoice from [facility]. Please contact [facility] at [phone] with questions or concerns regarding your invoice.   IF you received labwork today, you will receive an invoice from [place]. Please contact [facility] at [phone] with questions or concerns regarding your invoice.   Our billing staff will not be able to assist you with questions regarding bills from these companies.  You will be contacted with the lab results as soon as they are available. The fastest way to get your results is to activate your My Chart account. Instructions are located on the last page of this paperwork. If you have not heard from [place] regarding the results in 2 weeks, please contact this office.

## 2016-08-25 NOTE — Progress Notes (Signed)
Subjective:  By signing my name below, I, [name], attest that this documentation has been prepared under the direction and in the presence of [name], MD. Electronically Signed: [name], Scribe. 08/25/2016 , 3:56 PM .  Patient was seen in Room 2 .   Patient ID: Zachary Woods, male    DOB: [birth year], 51 y.o.   MRN: [id number] Chief Complaint  Patient presents with  . Hospitalization Follow-up    pt had several mini strokes, hole in heart -surgery 08/01/16 and patient has HTN issues.   HPI Zachary Woods is a 51 y.o. male  Patient is here for hospital follow up. He was admitted on June 1st through June 2nd for a PFO closure after previous MCA territory stroke that occurred in an embolic pattern. He was continued on aspirin and Plavix, echo without shunt or effusion. Plan for SBE prophylaxis for 6 months and repeat echo in 1 month. He will return to Dr. Excell Seltzer on the 26th.   He works as a Curator at [employer].   HTN He was restarted on Norvasc 2.5mg  QD as BP was elevated in hospital.   He plans to purchase a BP cuff at home. He did check his BP at [employer] the other day, which measured 138/92. He denies any known side effects with this medication. He is a former smoker, quit 7 years ago.   Lab Results  Component Value Date   CREATININE 0.70 07/21/2016    Stroke He was admitted on 5/19 through 5/21 with a cerebral embolism with CVA; embolic stroke as above; thought due to be PFO.   He denies having any residual weakness or slurred speech from the stroke; although, he has noticed slight lag when thinking, but improving.   GERD He takes PPI, protonix 40mg  QD. He would sometimes take it 4-5 times a week, up to every other day instead of daily.   Abnormal CT scan of liver Noted on CT abdomen/pelvis 07/20/16: 10mm hypodense lesion left lobe of the liver, possible fatty infiltration. Recommendation from hospitalization was repeat scan in 3 months. He did  drink alcohol up to 3-4 beverages a night, advised to refrain from further use.   He decreased his alcohol intake to about 4-5 beverages a week.   Lab Results  Component Value Date   ALT 47 07/19/2016   AST 46 (H) 07/19/2016   ALKPHOS 44 07/19/2016   BILITOT 0.9 07/19/2016   Numbness/tingling in toes He's noticed some numbness and tingling in his feet bilaterally, located bottom of his toes. He believes it may have gotten worse since his incident recently. His brother thought it could be diabetes as he's had similar issues, and so did their father. Patient notes he also walks around on hard concrete floor all day at work.   He had A1C checked while hospitalized from 5/19-5/21.   Lab Results  Component Value Date   HGBA1C 5.6 07/20/2016    Patient Active Problem List   Diagnosis Date Noted  . PFO (patent foramen ovale)   . Cerebral embolism with cerebral infarction 07/20/2016  . Stroke determined by clinical assessment (HCC) 07/20/2016  . Hypertension 07/19/2016  . GERD (gastroesophageal reflux disease) 07/19/2016  . Malignant hypertension 07/19/2016   Past Medical History:  Diagnosis Date  . Acid reflux   . CVA (cerebral vascular accident) (HCC)   . Hypertension    Past Surgical History:  Procedure Laterality Date  . NO PAST SURGERIES    . PATENT  FORAMEN OVALE(PFO) CLOSURE N/A 08/01/2016   Procedure: Patent Forament [place](PFO) Closure;  Surgeon: Tonny Bollmanooper, Michael, MD;  [facility];  Service: Cardiovascular;  Laterality: N/A;  . TEE WITHOUT CARDIOVERSION N/A 07/21/2016   Procedure: TRANSESOPHAGEAL ECHOCARDIOGRAM (TEE);  Surgeon: Lars MassonNelson, Katarina H, MD;  [facility];  Service: Cardiovascular;  Laterality: N/A;   Allergies  Allergen Reactions  . Penicillins Itching    Has patient had a PCN reaction causing immediate rash, facial/tongue/throat swelling, SOB or lightheadedness with hypotension: No Has patient had a PCN reaction causing severe rash  involving mucus membranes or skin necrosis: No Has patient had a PCN reaction that required hospitalization: No Has patient had a PCN reaction occurring within the last 10 years: Yes If all of the above answers are "NO", then may proceed with Cephalosporin use.   Prior to Admission medications   Medication Sig Start Date End Date Taking? Authorizing Provider  amLODipine (NORVASC) 2.5 MG tablet Take 1 tablet (2.5 mg total) by mouth daily. 08/03/16   Manson PasseyBhagat, Bhavinkumar, PA  aspirin 81 MG tablet Take 1 tablet (81 mg total) by mouth daily. 08/02/16   Bhagat, Sharrell KuBhavinkumar, PA  atorvastatin (LIPITOR) 20 MG tablet Take 1 tablet (20 mg total) by mouth daily at 6 PM. 07/21/16   Osvaldo ShipperKrishnan, Gokul, MD  clopidogrel (PLAVIX) 75 MG tablet Take 1 tablet (75 mg total) by mouth daily. 07/23/16 07/23/17  Tonny Bollmanooper, Michael, MD  Multiple Vitamin (MULTIVITAMIN WITH MINERALS) TABS tablet Take 1 tablet by mouth daily.    [provider]  pantoprazole (PROTONIX) 40 MG tablet Take 1 tablet (40 mg total) by mouth daily. 07/23/16   Tonny Bollmanooper, Michael, MD  tetrahydrozoline 0.05 % ophthalmic solution Place 2 drops into both eyes 2 (two) times daily as needed (dry eyes). Visine    [provider]   Social History   Social History  . Marital status: Single    Spouse name: N/A  . Number of children: N/A  . Years of education: N/A   Occupational History  . Curatormechanic    Social History Main Topics  . Smoking status: Former Games developermoker  . Smokeless tobacco: Never Used  . Alcohol use Yes     Comment: 10-12/ week-- (2-4 drinks/ day)  . Drug use: No  . Sexual activity: Yes   Other Topics Concern  . Not on file   Social History Narrative  . No narrative on file   Review of Systems  Constitutional: Negative for fatigue and unexpected weight change.  Eyes: Negative for visual disturbance.  Respiratory: Negative for cough, chest tightness and shortness of breath.   Cardiovascular: Negative for chest pain, palpitations  and leg swelling.  Gastrointestinal: Negative for abdominal pain and blood in stool.  Neurological: Positive for numbness. Negative for dizziness, light-headedness and headaches.       Objective:   Physical Exam  Constitutional: He is oriented to person, place, and time. He appears well-developed and well-nourished.  HENT:  Head: Normocephalic and atraumatic.  Eyes: EOM are normal. Pupils are equal, round, and reactive to light.  Neck: No JVD present. Carotid bruit is not present.  Cardiovascular: Normal rate, regular rhythm and normal heart sounds.   No murmur heard. Pulses:      Dorsalis pedis pulses are 2+ on the right side, and 2+ on the left side.  Cap refill <1 bilateral toes  Pulmonary/Chest: Effort normal and breath sounds normal. He has no rales.  Musculoskeletal: He exhibits no edema.  No lower extremity  swelling  Neurological: He is alert and oriented to person, place, and time.  Sensations intact into distal toes  Skin: Skin is warm and dry.  Psychiatric: He has a normal mood and affect.  Vitals reviewed.   Vitals:   08/25/16 1457 08/25/16 1500  BP: (!) 155/97 (!) 148/90  Pulse: 92   Resp: 16   Temp: 98.8 F (37.1 C)   TempSrc: Oral   SpO2: 99%   Weight: 162 lb 3.2 oz (73.6 kg)   Height: 5' 7.44" (1.713 m)       Assessment & Plan:   Zachary Woods is a 51 y.o. male Essential hypertension - Plan: amLODipine (NORVASC) 5 MG tablet, Care order/instruction:  - Decreased control, will increase amlodipine to 5 mg daily, monitor home readings. Recheck next 3 months  Cerebrovascular accident (CVA) due to embolism of precerebral artery (HCC)  - Thought to be embolic from PFO. Status post repair of PFO. Denies any residual deficit. Continue Plavix, aspirin as recommended by specialist.  Gastroesophageal reflux disease, esophagitis presence not specified Encounter for monitoring long-term proton pump inhibitor therapy - Plan: TSH, Vitamin B12, Magnesium  - Episodic  use of PPI as tolerated with trigger avoidance. Check B12, magnesium with chronic use of PPI  Dysesthesia - Plan: TSH, Vitamin B12, Magnesium  - Check TSH, B12, follow-up to discuss other potential causes if these are normal.  Liver lesion  - Possible fatty liver, planned for recheck LFTs and imaging in 3 months. Avoid alcohol.  Meds ordered this encounter  Medications  . amLODipine (NORVASC) 5 MG tablet    Sig: Take 1 tablet (5 mg total) by mouth daily.    Dispense:  90 tablet    Refill:  1  . atorvastatin (LIPITOR) 20 MG tablet    Sig: Take 1 tablet (20 mg total) by mouth daily at 6 PM.    Dispense:  30 tablet    Refill:  1   Patient Instructions   Try every other day dosing to see if that still controls heartburn. If you do need that med every day, then will need to continue to monitor your magnesium and B12. I will check those levels today. See foods to avoid below.  Increase Norvasc to 5mg  once per day. Keep a record of your blood pressures outside of the office and bring them to the next office visit.  Continue to avoid alcohol, or cut back and plan rechecking liver test and imaging of liver in next 3 months.   For numbness in toes, I will check some labwork, but if that continues or worsens, return to discuss further.  Schedule physical in the next 3 months and we can review other health maintenance items and cancer screening at that time.   Food Choices for Gastroesophageal Reflux Disease, Adult When you have gastroesophageal reflux disease (GERD), the foods you eat and your eating habits are very important. Choosing the right foods can help ease the discomfort of GERD. Consider working with a diet and nutrition specialist (dietitian) to help you make healthy food choices. What general guidelines should I follow? Eating plan  Choose healthy foods low in fat, such as fruits, vegetables, whole grains, low-fat dairy products, and lean meat, fish, and poultry.  Eat  frequent, small meals instead of three large meals each day. Eat your meals slowly, in a relaxed setting. Avoid bending over or lying down until 2-3 hours after eating.  Limit high-fat foods such as fatty meats or fried foods.  Limit your intake of oils, butter, and shortening to less than 8 teaspoons each day.  Avoid the following: ? Foods that cause symptoms. These may be different for different people. Keep a food diary to keep track of foods that cause symptoms. ? Alcohol. ? Drinking large amounts of liquid with meals. ? Eating meals during the 2-3 hours before bed.  Cook foods using methods other than frying. This may include baking, grilling, or broiling. Lifestyle   Maintain a healthy weight. Ask your health care provider what weight is healthy for you. If you need to lose weight, work with your health care provider to do so safely.  Exercise for at least 30 minutes on 5 or more days each week, or as told by your health care provider.  Avoid wearing clothes that fit tightly around your waist and chest.  Do not use any products that contain nicotine or tobacco, such as cigarettes and e-cigarettes. If you need help quitting, ask your health care provider.  Sleep with the head of your bed raised. Use a wedge under the mattress or blocks under the bed frame to raise the head of the bed. What foods are not recommended? The items listed may not be a complete list. Talk with your dietitian about what dietary choices are best for you. Grains Pastries or quick breads with added fat. [place] toast. Vegetables Deep fried vegetables. [place] fries. Any vegetables prepared with added fat. Any vegetables that cause symptoms. For some people this may include tomatoes and tomato products, chili peppers, onions and garlic, and horseradish. Fruits Any fruits prepared with added fat. Any fruits that cause symptoms. For some people this may include citrus fruits, such as oranges, grapefruit,  pineapple, and lemons. Meats and other protein foods High-fat meats, such as fatty beef or pork, hot dogs, ribs, ham, sausage, salami and bacon. Fried meat or protein, including fried fish and fried chicken. Nuts and nut butters. Dairy Whole milk and chocolate milk. Sour cream. Cream. Ice cream. Cream cheese. Milk shakes. Beverages Coffee and tea, with or without caffeine. Carbonated beverages. Sodas. Energy drinks. Fruit juice made with acidic fruits (such as orange or grapefruit). Tomato juice. Alcoholic drinks. Fats and oils Butter. Margarine. Shortening. Ghee. Sweets and desserts Chocolate and cocoa. Donuts. Seasoning and other foods Pepper. Peppermint and spearmint. Any condiments, herbs, or seasonings that cause symptoms. For some people, this may include curry, hot sauce, or vinegar-based salad dressings. Summary  When you have gastroesophageal reflux disease (GERD), food and lifestyle choices are very important to help ease the discomfort of GERD.  Eat frequent, small meals instead of three large meals each day. Eat your meals slowly, in a relaxed setting. Avoid bending over or lying down until 2-3 hours after eating.  Limit high-fat foods such as fatty meat or fried foods. This information is not intended to replace advice given to you by your health care provider. Make sure you discuss any questions you have with your health care provider. Document Released: 02/17/2005 Document Revised: 02/19/2016 Document Reviewed: 02/19/2016 Elsevier Interactive Patient Education  2017 Elsevier Inc.  Hypertension Hypertension, commonly called high blood pressure, is when the force of blood pumping through the arteries is too strong. The arteries are the blood vessels that carry blood from the heart throughout the body. Hypertension forces the heart to work harder to pump blood and may cause arteries to become narrow or stiff. Having untreated or uncontrolled hypertension can cause heart attacks,  strokes, kidney disease, and other problems.  A blood pressure reading consists of a higher number over a lower number. Ideally, your blood pressure should be below 120/80. The first ("top") number is called the systolic pressure. It is a measure of the pressure in your arteries as your heart beats. The second ("bottom") number is called the diastolic pressure. It is a measure of the pressure in your arteries as the heart relaxes. What are the causes? The cause of this condition is not known. What increases the risk? Some risk factors for high blood pressure are under your control. Others are not. Factors you can change  Smoking.  Having type 2 diabetes mellitus, high cholesterol, or both.  Not getting enough exercise or physical activity.  Being overweight.  Having too much fat, sugar, calories, or salt (sodium) in your diet.  Drinking too much alcohol. Factors that are difficult or impossible to change  Having chronic kidney disease.  Having a family history of high blood pressure.  Age. Risk increases with age.  Race. You may be at higher risk if you are African-American.  Gender. Men are at higher risk than women before age 29. After age 73, women are at higher risk than men.  Having obstructive sleep apnea.  Stress. What are the signs or symptoms? Extremely high blood pressure (hypertensive crisis) may cause:  Headache.  Anxiety.  Shortness of breath.  Nosebleed.  Nausea and vomiting.  Severe chest pain.  Jerky movements you cannot control (seizures).  How is this diagnosed? This condition is diagnosed by measuring your blood pressure while you are seated, with your arm resting on a surface. The cuff of the blood pressure monitor will be placed directly against the skin of your upper arm at the level of your heart. It should be measured at least twice using the same arm. Certain conditions can cause a difference in blood pressure between your right and left  arms. Certain factors can cause blood pressure readings to be lower or higher than normal (elevated) for a short period of time:  When your blood pressure is higher when you are in a health care provider's office than when you are at home, this is called white coat hypertension. Most people with this condition do not need medicines.  When your blood pressure is higher at home than when you are in a health care provider's office, this is called masked hypertension. Most people with this condition may need medicines to control blood pressure.  If you have a high blood pressure reading during one visit or you have normal blood pressure with other risk factors:  You may be asked to return on a different day to have your blood pressure checked again.  You may be asked to monitor your blood pressure at home for 1 week or longer.  If you are diagnosed with hypertension, you may have other blood or imaging tests to help your health care provider understand your overall risk for other conditions. How is this treated? This condition is treated by making healthy lifestyle changes, such as eating healthy foods, exercising more, and reducing your alcohol intake. Your health care provider may prescribe medicine if lifestyle changes are not enough to get your blood pressure under control, and if:  Your systolic blood pressure is above 130.  Your diastolic blood pressure is above 80.  Your personal target blood pressure may vary depending on your medical conditions, your age, and other factors. Follow these instructions at home: Eating and drinking  Eat a diet that is  high in fiber and potassium, and low in sodium, added sugar, and fat. An example eating plan is called the DASH (Dietary Approaches to Stop Hypertension) diet. To eat this way: ? Eat plenty of fresh fruits and vegetables. Try to fill half of your plate at each meal with fruits and vegetables. ? Eat whole grains, such as whole wheat pasta,  brown rice, or whole grain bread. Fill about one quarter of your plate with whole grains. ? Eat or drink low-fat dairy products, such as skim milk or low-fat yogurt. ? Avoid fatty cuts of meat, processed or cured meats, and poultry with skin. Fill about one quarter of your plate with lean proteins, such as fish, chicken without skin, beans, eggs, and tofu. ? Avoid premade and processed foods. These tend to be higher in sodium, added sugar, and fat.  Reduce your daily sodium intake. Most people with hypertension should eat less than 1,500 mg of sodium a day.  Limit alcohol intake to no more than 1 drink a day for nonpregnant women and 2 drinks a day for men. One drink equals 12 oz of beer, 5 oz of wine, or 1 oz of hard liquor. Lifestyle  Work with your health care provider to maintain a healthy body weight or to lose weight. Ask what an ideal weight is for you.  Get at least 30 minutes of exercise that causes your heart to beat faster (aerobic exercise) most days of the week. Activities may include walking, swimming, or biking.  Include exercise to strengthen your muscles (resistance exercise), such as pilates or lifting weights, as part of your weekly exercise routine. Try to do these types of exercises for 30 minutes at least 3 days a week.  Do not use any products that contain nicotine or tobacco, such as cigarettes and e-cigarettes. If you need help quitting, ask your health care provider.  Monitor your blood pressure at home as told by your health care provider.  Keep all follow-up visits as told by your health care provider. This is important. Medicines  Take over-the-counter and prescription medicines only as told by your health care provider. Follow directions carefully. Blood pressure medicines must be taken as prescribed.  Do not skip doses of blood pressure medicine. Doing this puts you at risk for problems and can make the medicine less effective.  Ask your health care provider  about side effects or reactions to medicines that you should watch for. Contact a health care provider if:  You think you are having a reaction to a medicine you are taking.  You have headaches that keep coming back (recurring).  You feel dizzy.  You have swelling in your ankles.  You have trouble with your vision. Get help right away if:  You develop a severe headache or confusion.  You have unusual weakness or numbness.  You feel faint.  You have severe pain in your chest or abdomen.  You vomit repeatedly.  You have trouble breathing. Summary  Hypertension is when the force of blood pumping through your arteries is too strong. If this condition is not controlled, it may put you at risk for serious complications.  Your personal target blood pressure may vary depending on your medical conditions, your age, and other factors. For most people, a normal blood pressure is less than 120/80.  Hypertension is treated with lifestyle changes, medicines, or a combination of both. Lifestyle changes include weight loss, eating a healthy, low-sodium diet, exercising more, and limiting alcohol. This information  is not intended to replace advice given to you by your health care provider. Make sure you discuss any questions you have with your health care provider. Document Released: 02/17/2005 Document Revised: 01/16/2016 Document Reviewed: 01/16/2016 Elsevier Interactive Patient Education  2018 [employer].    IF you received an x-ray today, you will receive an invoice from [facility]. Please contact [facility] at [phone] with questions or concerns regarding your invoice.   IF you received labwork today, you will receive an invoice from [facility]. Please contact [facility] at [phone] with questions or concerns regarding your invoice.   Our billing staff will not be able to assist you with questions regarding bills from these companies.  You will be contacted  with the lab results as soon as they are available. The fastest way to get your results is to activate your My Chart account. Instructions are located on the last page of this paperwork. If you have not heard from [place] regarding the results in 2 weeks, please contact this office.      I personally performed the services described in this documentation, which was scribed in my presence. The recorded information has been reviewed and considered for accuracy and completeness, addended by me as needed, and agree with information above.  Signed,   [name], MD [facility] at [facility].  08/28/16 9:05 AM

## 2016-08-26 LAB — MAGNESIUM: MAGNESIUM: 2 mg/dL (ref 1.6–2.3)

## 2016-08-26 LAB — TSH: TSH: 1.62 u[IU]/mL (ref 0.450–4.500)

## 2016-08-26 LAB — VITAMIN B12: Vitamin B-12: 474 pg/mL (ref 232–1245)

## 2016-08-28 ENCOUNTER — Encounter: Payer: Self-pay | Admitting: Physician Assistant

## 2016-09-01 ENCOUNTER — Ambulatory Visit: Payer: 59 | Admitting: Physician Assistant

## 2016-09-16 ENCOUNTER — Encounter: Payer: Self-pay | Admitting: *Deleted

## 2016-09-17 ENCOUNTER — Encounter: Payer: 59 | Admitting: *Deleted

## 2016-09-17 ENCOUNTER — Ambulatory Visit (INDEPENDENT_AMBULATORY_CARE_PROVIDER_SITE_OTHER): Payer: 59 | Admitting: Physician Assistant

## 2016-09-17 ENCOUNTER — Encounter: Payer: Self-pay | Admitting: Physician Assistant

## 2016-09-17 VITALS — BP 120/80 | HR 96 | Ht 68.0 in | Wt 163.8 lb

## 2016-09-17 DIAGNOSIS — Q211 Atrial septal defect: Secondary | ICD-10-CM | POA: Diagnosis not present

## 2016-09-17 DIAGNOSIS — Q2112 Patent foramen ovale: Secondary | ICD-10-CM

## 2016-09-17 DIAGNOSIS — I10 Essential (primary) hypertension: Secondary | ICD-10-CM | POA: Diagnosis not present

## 2016-09-17 DIAGNOSIS — Z006 Encounter for examination for normal comparison and control in clinical research program: Secondary | ICD-10-CM

## 2016-09-17 NOTE — Progress Notes (Signed)
Cardiology Office Note    Date:  09/17/2016   ID:  Zachary Woods, DOB 04-23-1965, MRN [id number]  PCP:  Shade FloodGreene, Zachary R, MD  Cardiologist:  Dr. Excell Seltzerooper  Chief Complaint: Hospital follow up for PFO closer  History of Present Illness:   Zachary HailJames S Decoster is a 51 y.o. male here for hospital follow up.   Hx of HTN and recently had left MCA Territory stroke that are occurred in embolic pattern. The patient is evaluated by Dr. Excell Seltzerooper and felt appropriate for PFO closer. S/p Successful transcatheter PFO closure using ultrasound and fluoroscopic guidance (25 mm Amplatzer PFO Occluder) 08/01/16. He will need SBE prophylaxis for first 6 months.  Here today for follow up. He is in an Librarian, academicautomotive mechanic. During workup without chest pain, shortness of breath or dizziness. Denies orthopnea, PND, syncope, lower extremity edema or melena. Compliant with medication.    Past Medical History:  Diagnosis Date  . Acid reflux   . CVA (cerebral vascular accident) (HCC)   . Hypertension     Past Surgical History:  Procedure Laterality Date  . NO PAST SURGERIES    . PATENT FORAMEN OVALE(PFO) CLOSURE N/A 08/01/2016   Procedure: Patent Forament [place](PFO) Closure;  Surgeon: Tonny Bollmanooper, Michael, MD;  [facility];  Service: Cardiovascular;  Laterality: N/A;  . TEE WITHOUT CARDIOVERSION N/A 07/21/2016   Procedure: TRANSESOPHAGEAL ECHOCARDIOGRAM (TEE);  Surgeon: Lars MassonNelson, Katarina H, MD;  [facility];  Service: Cardiovascular;  Laterality: N/A;    Current Medications: Prior to Admission medications   Medication Sig Start Date End Date Taking? Authorizing Provider  amLODipine (NORVASC) 5 MG tablet Take 1 tablet (5 mg total) by mouth daily. 08/25/16   Shade FloodGreene, Zachary R, MD  aspirin 81 MG tablet Take 1 tablet (81 mg total) by mouth daily. 08/02/16   Aysa Larivee, Sharrell KuBhavinkumar, PA  atorvastatin (LIPITOR) 20 MG tablet Take 1 tablet (20 mg total) by mouth daily at 6 PM. 08/25/16   Shade FloodGreene, Zachary R, MD    clopidogrel (PLAVIX) 75 MG tablet Take 1 tablet (75 mg total) by mouth daily. 07/23/16 07/23/17  Tonny Bollmanooper, Michael, MD  Multiple Vitamin (MULTIVITAMIN WITH MINERALS) TABS tablet Take 1 tablet by mouth daily.    [provider]  pantoprazole (PROTONIX) 40 MG tablet Take 1 tablet (40 mg total) by mouth daily. 07/23/16   Tonny Bollmanooper, Michael, MD  tetrahydrozoline 0.05 % ophthalmic solution Place 2 drops into both eyes 2 (two) times daily as needed (dry eyes). Visine    [provider]    Allergies:   Penicillins   Social History   Social History  . Marital status: Single    Spouse name: N/A  . Number of children: N/A  . Years of education: N/A   Occupational History  . Curatormechanic    Social History Main Topics  . Smoking status: Former Games developermoker  . Smokeless tobacco: Never Used  . Alcohol use Yes     Comment: 10-12/ week-- (2-4 drinks/ day)  . Drug use: No  . Sexual activity: Yes   Other Topics Concern  . None   Social History Narrative  . None     Family History:  The patient's family history includes Diabetes in his brother and father.  ROS:   Please see the history of present illness.    ROS All other systems reviewed and are negative.   PHYSICAL EXAM:   VS:  BP 120/80   Pulse 96   Ht 5\' 8"  (1.727 m)  Wt 163 lb 12.8 oz (74.3 kg)   BMI 24.91 kg/m    GEN: Well nourished, well developed, in no acute distress  HEENT: normal  Neck: no JVD, carotid bruits, or masses Cardiac: RRR; no murmurs, rubs, or gallops,no edema  Respiratory:  clear to auscultation bilaterally, normal work of breathing GI: soft, nontender, nondistended, + BS MS: no deformity or atrophy  Skin: warm and dry, no rash Neuro:  Alert and Oriented x 3, Strength and sensation are intact Psych: euthymic mood, full affect  Wt Readings from Last 3 Encounters:  09/17/16 163 lb 12.8 oz (74.3 kg)  08/25/16 162 lb 3.2 oz (73.6 kg)  08/02/16 162 lb 14.7 oz (73.9 kg)      Studies/Labs Reviewed:    EKG:  EKG is not ordered today.   Recent Labs: 07/19/2016: ALT 47 07/21/2016: BUN <5; Creatinine, Ser 0.70; Hemoglobin 12.5; Platelets 183; Potassium 3.5; Sodium 139 08/25/2016: Magnesium 2.0; TSH 1.620   Lipid Panel    Component Value Date/Time   CHOL 162 07/20/2016 0427   TRIG 227 (H) 07/20/2016 0427   HDL 49 07/20/2016 0427   CHOLHDL 3.3 07/20/2016 0427   VLDL 45 (H) 07/20/2016 0427   LDLCALC 68 07/20/2016 0427    Additional studies/ records that were reviewed today include:   Echocardiogram: 08/02/16 Study Conclusions  - Left ventricle: The cavity size was normal. Systolic function was   normal. The estimated ejection fraction was in the range of 55%   to 60%. There is hypokinesis of the basalinferior myocardium.   Doppler parameters are consistent with abnormal left ventricular   relaxation (grade 1 diastolic dysfunction). - Atrial septum: An Amplatzer closure device was present. 25mm,   intact in proper position. No Doppler shunt signal.   ASSESSMENT & PLAN:    1. S/p PFO - Asymptomatic. SBE prophylaxis for first 6 months. Continue aspirin and Plavix.  2. HTN - Stable and well controlled on current regimen.      Medication Adjustments/Labs and Tests Ordered: Current medicines are reviewed at length with the patient today.  Concerns regarding medicines are outlined above.  Medication changes, Labs and Tests ordered today are listed in the Patient Instructions below. Patient Instructions  Medication Instructions:   Your physician recommends that you continue on your current medications as directed. Please refer to the Current Medication list given to you today.   If you need a refill on your cardiac medications before your next appointment, please call your pharmacy.  Labwork: NONE ORDERED  TODAY    Testing/Procedures: NONE ORDERED  TODAY    Follow-Up: WITH DR Excell Seltzer IN BEGINNING OF January  Your physician wants you to follow-up in:  IN  6  MONTHS  WITH DR Excell Seltzer   You will receive a reminder letter in the mail two months in advance. If you don't receive a letter, please call our office to schedule the follow-up appointment.       Any Other Special Instructions Will Be Listed Below (If Applicable).  Lorelei Pont, [place]  09/17/2016 3:21 PM    [facility] [address] [place], [place], [place]  [address] Phone: [phone]; Fax: [phone]

## 2016-09-17 NOTE — Patient Instructions (Addendum)
Medication Instructions:   Your physician recommends that you continue on your current medications as directed. Please refer to the Current Medication list given to you today.   If you need a refill on your cardiac medications before your next appointment, please call your pharmacy.  Labwork: NONE ORDERED  TODAY    Testing/Procedures: NONE ORDERED  TODAY    Follow-Up: WITH DR Excell SeltzerOOPER IN BEGINNING OF January  Your physician wants you to follow-up in:  IN  6  MONTHS WITH DR Excell SeltzerOOPER   You will receive a reminder letter in the mail two months in advance. If you don't receive a letter, please call our office to schedule the follow-up appointment.       Any Other Special Instructions Will Be Listed Below (If Applicable).

## 2016-10-07 ENCOUNTER — Ambulatory Visit (INDEPENDENT_AMBULATORY_CARE_PROVIDER_SITE_OTHER): Payer: 59 | Admitting: Neurology

## 2016-10-07 ENCOUNTER — Encounter: Payer: Self-pay | Admitting: Neurology

## 2016-10-07 VITALS — BP 137/93 | HR 105 | Ht 68.0 in | Wt 162.2 lb

## 2016-10-07 DIAGNOSIS — Q211 Atrial septal defect: Secondary | ICD-10-CM

## 2016-10-07 DIAGNOSIS — I63412 Cerebral infarction due to embolism of left middle cerebral artery: Secondary | ICD-10-CM

## 2016-10-07 DIAGNOSIS — Q2112 Patent foramen ovale: Secondary | ICD-10-CM

## 2016-10-07 NOTE — Progress Notes (Signed)
STROKE NEUROLOGY FOLLOW UP NOTE  NAME: DEANDREA RION DOB: 25-Jan-1966  REASON FOR VISIT: stroke follow up HISTORY FROM: pt and chart  Today we had the pleasure of seeing EULISES KIJOWSKI in follow-up at our Neurology Clinic. Pt was accompanied by no one.   History Summary Mr. ZYDEN SUMAN is a 51 y.o. male with history of acid reflux and hypertension admitted on 07/19/16 for speech difficulties with numbness and tingling of the left extremities. MRI showed acute subcentimeter LEFT MCA territory infarcts most compatible with embolic etiology. Stroke work up including MRA head and neck, LE venous doppler, TTE, pan CT all negative. However, TCD bubble study showed small PFO with valsalva and TEE showed positive bubble study consistent with PFO. LDL 68 and A1C 5.6. UDS positive for THC. Hypercoagulable work up only positive for beta 2 glycoprotein IgG. He was discharged on ASA and recommend outpt follow up with Dr. Excell Seltzer.   Interval History During the interval time, the patient has been doing well. Neuro deficit resolved. He followed with Dr. Excell Seltzer and had PFO closure on 08/01/16. He was put on DAPT and lipitor. Post procedure TTE showed good position of closure device. He feels good and no complains. Bp today 137/93 but stated that his BP was good in general.   REVIEW OF SYSTEMS: Full 14 system review of systems performed and notable only for those listed below and in HPI above, all others are negative:  Constitutional:   Cardiovascular:  Ear/Nose/Throat:   Skin:  Eyes:   Respiratory:   Gastroitestinal:   Genitourinary:  Hematology/Lymphatic:   Endocrine:  Musculoskeletal:   Allergy/Immunology:   Neurological:   Psychiatric:  Sleep:   The following represents the patient's updated allergies and side effects list: Allergies  Allergen Reactions  . Penicillins Itching    Has patient had a PCN reaction causing immediate rash, facial/tongue/throat swelling, SOB or lightheadedness with  hypotension: No Has patient had a PCN reaction causing severe rash involving mucus membranes or skin necrosis: No Has patient had a PCN reaction that required hospitalization: No Has patient had a PCN reaction occurring within the last 10 years: Yes If all of the above answers are "NO", then may proceed with Cephalosporin use.    The neurologically relevant items on the patient's problem list were reviewed on today's visit.  Neurologic Examination  A problem focused neurological exam (12 or more points of the single system neurologic examination, vital signs counts as 1 point, cranial nerves count for 8 points) was performed.  Blood pressure (!) 137/93, pulse (!) 105, height 5\' 8"  (1.727 m), weight 162 lb 3.2 oz (73.6 kg).  General - Well nourished, well developed, in no apparent distress.  Ophthalmologic - Sharp disc margins OU.   Cardiovascular - Regular rate and rhythm with no murmur.  Mental Status -  Level of arousal and orientation to time, place, and person were intact. Language including expression, naming, repetition, comprehension was assessed and found intact. Attention span and concentration were normal. Recent and remote memory were intact. Fund of Knowledge was assessed and was intact.  Cranial Nerves II - XII - II - Visual field intact OU. III, IV, VI - Extraocular movements intact. V - Facial sensation intact bilaterally. VII - Facial movement intact bilaterally. VIII - Hearing & vestibular intact bilaterally. X - Palate elevates symmetrically. XI - Chin turning & shoulder shrug intact bilaterally. XII - Tongue protrusion intact.  Motor Strength - The patient's strength was normal in all  extremities and pronator drift was absent.  Bulk was normal and fasciculations were absent.   Motor Tone - Muscle tone was assessed at the neck and appendages and was normal.  Reflexes - The patient's reflexes were 1+ in all extremities and he had no pathological  reflexes.  Sensory - Light touch, temperature/pinprick, vibration and proprioception, and Romberg testing were assessed and were normal.    Coordination - The patient had normal movements in the hands and feet with no ataxia or dysmetria.  Tremor was absent.  Gait and Station - The patient's transfers, posture, gait, station, and turns were observed as normal.   Functional score  mRS = 0   0 - No symptoms.   1 - No significant disability. Able to carry out all usual activities, despite some symptoms.   2 - Slight disability. Able to look after own affairs without assistance, but unable to carry out all previous activities.   3 - Moderate disability. Requires some help, but able to walk unassisted.   4 - Moderately severe disability. Unable to attend to own bodily needs without assistance, and unable to walk unassisted.   5 - Severe disability. Requires constant nursing care and attention, bedridden, incontinent.   6 - Dead.   NIH Stroke Scale = 0   Data reviewed: I personally reviewed the images and agree with the radiology interpretations.  Ct Head Wo Contrast 07/19/2016 No cause for the patient's symptoms identified. No acute intracranial abnormality.   Mr Angiogram Neck W Contrast 07/19/2016  MRI HEAD:  Acute subcentimeter LEFT MCA territory infarcts most compatible with embolic etiology. Otherwise negative mildly motion degraded noncontrast MRI head.   MRA HEAD:  No emergent large vessel occlusion. Mild LEFT M1 luminal regularity and moderate stenosis LEFT M2 origin secondary to atherosclerosis, thromboembolism or motion artifact.   MRA NECK:  No hemodynamically significant stenosis or acute vascular process.   LE venous doppler - negative  TTE - Left ventricle: The cavity size was normal. Wall thickness was increased in a pattern of mild LVH. Systolic function was normal. The estimated ejection fraction was in the range of 55% to 60%. Wall motion  was normal; there were no regional wall motion abnormalities. There was no evidence of elevated ventricular filling pressure by Doppler parameters. - Mitral valve: There was mild regurgitation. Impressions: - No cardiac source of emboli was indentified.  TEE  07/21/2016 Impressions: - Positive bubble study consistent with present PFO. No thrombus in the left atrium or lefta trial appendage.   EF 55-60%.  TCD bubble study - 15-20 HITs with valsalva  CT chest / abd / pelvis With Contrast 07/20/2016 1. No explanation for weight loss. No evidence of malignancy in the chest, abdomen, or pelvis. 2. Questionable 10 mm hypodense lesion in the left lobe of the liver may simply be focal fatty infiltration. In a low risk patient with no known malignancy, hepatic dysfunction, hepatic risk factors or symptoms attributable to the liver, no further follow-up is needed. 3. Thoracoabdominal aortic atherosclerosis. Scattered coronary artery calcifications.  TTE 08/02/16 - Left ventricle: The cavity size was normal. Systolic function was   normal. The estimated ejection fraction was in the range of 55%   to 60%. There is hypokinesis of the basalinferior myocardium.   Doppler parameters are consistent with abnormal left ventricular   relaxation (grade 1 diastolic dysfunction). - Atrial septum: An Amplatzer closure device was present. 25mm,   intact in proper position. No Doppler shunt signal.  Component  Latest Ref Rng & Units 07/19/2016 07/20/2016 07/21/2016 08/25/2016  Cholesterol     0 - 200 mg/dL  161    Triglycerides     <150 mg/dL  096 (H)    HDL Cholesterol     >40 mg/dL  49    Total CHOL/HDL Ratio     RATIO  3.3    VLDL     0 - 40 mg/dL  45 (H)    LDL (calc)     0 - 99 mg/dL  68    PTT Lupus Anticoagulant     0.0 - 51.9 sec   32.2   DRVVT     0.0 - 47.0 sec   34.0   Lupus Anticoag Interp        Comment:   Beta-2 Glycoprotein I Ab, IgG     0 - 20 GPI IgG units   57 (H)    Beta-2-Glycoprotein I IgM     0 - 32 GPI IgM units   <9   Beta-2-Glycoprotein I IgA     0 - 25 GPI IgA units   27 (H)   Anticardiolipin Ab,IgG,Qn     0 - 14 GPL U/mL   <9   Anticardiolipin Ab,IgM,Qn     0 - 12 MPL U/mL   <9   Anticardiolipin Ab,IgA,Qn     0 - 11 APL U/mL   <9   Hemoglobin A1C     4.8 - 5.6 %  5.6    Mean Plasma Glucose     mg/dL  045    HIV Screen 4th Generation wRfx     Non Reactive Non Reactive     Homocysteine     0.0 - 15.0 umol/L   17.3 (H)   TSH     0.450 - 4.500 uIU/mL    1.620  Vitamin B12     232 - 1,245 pg/mL    474    Assessment: As you may recall, he is a 51 y.o. Caucasian male with PMH of GERD and hypertension admitted on 07/19/16 for acute subcentimeter LEFT MCA territory infarcts most compatible with embolic etiology. Stroke work up including MRA head and neck, LE venous doppler, TTE, pan CT all negative. However, TCD bubble study showed small PFO with valsalva and TEE showed positive bubble study consistent with PFO. LDL 68 and A1C 5.6. UDS positive for THC. Hypercoagulable work up only positive for beta 2 glycoprotein IgG. He was discharged on ASA and had outpt follow up with Dr. Excell Seltzer who did PFO closure on 08/01/16. He was put on DAPT and lipitor. Post procedure TTE showed good position of closure device. Will need to repeat beta 2 glycoprotein IgG in one month and TCD bubble study 6 months post procedure.  Plan:  - continue ASA and plavix and lipitor for stroke prevention - check BP at home and record - Follow up with your primary care physician for stroke risk factor modification. Recommend maintain blood pressure goal <130/80, diabetes with hemoglobin A1c goal below 7.0% and lipids with LDL cholesterol goal below 70 mg/dL.  - healthy diet and regular exercise  - will note Dr. Neva Seat to repeat beta-2 glycoprotein antibodies on the next blood draw.  - follow up in 4 months and will repeat TCD bubble studies.   I spent more than 25 minutes of face  to face time with the patient. Greater than 50% of time was spent in counseling and coordination of care. We discussed repeat labs and  TCD bubble study in the future, and compliant with medicaiton.   No orders of the defined types were placed in this encounter.   No orders of the defined types were placed in this encounter.   Patient Instructions  - continue ASA and plavix and lipitor for stroke prevention - check BP at home and record - Follow up with your primary care physician for stroke risk factor modification. Recommend maintain blood pressure goal <130/80, diabetes with hemoglobin A1c goal below 7.0% and lipids with LDL cholesterol goal below 70 mg/dL.  - healthy diet and regular exercise  - will note Dr. Neva SeatGreene to repeat beta-2 glycoprotein antibodies on the next blood draw.  - follow up in 4 months and will repeat TCD bubble studies.    Marvel PlanJindong Allana Shrestha, MD PhD [facility] [address], [address] [place], [place] [address] [phone]

## 2016-10-07 NOTE — Patient Instructions (Addendum)
-   continue ASA and plavix and lipitor for stroke prevention - check BP at home and record - Follow up with your primary care physician for stroke risk factor modification. Recommend maintain blood pressure goal <130/80, diabetes with hemoglobin A1c goal below 7.0% and lipids with LDL cholesterol goal below 70 mg/dL.  - healthy diet and regular exercise  - will note Dr. Neva SeatGreene to repeat beta-2 glycoprotein antibodies on the next blood draw.  - follow up in 4 months and will repeat TCD bubble studies.

## 2016-11-19 ENCOUNTER — Other Ambulatory Visit: Payer: Self-pay | Admitting: Family Medicine

## 2016-11-27 ENCOUNTER — Encounter: Payer: Self-pay | Admitting: Family Medicine

## 2016-11-27 ENCOUNTER — Ambulatory Visit (INDEPENDENT_AMBULATORY_CARE_PROVIDER_SITE_OTHER): Payer: 59 | Admitting: Family Medicine

## 2016-11-27 VITALS — HR 86 | Temp 98.8°F | Resp 16 | Ht 68.0 in | Wt 169.8 lb

## 2016-11-27 DIAGNOSIS — N529 Male erectile dysfunction, unspecified: Secondary | ICD-10-CM

## 2016-11-27 DIAGNOSIS — Z1211 Encounter for screening for malignant neoplasm of colon: Secondary | ICD-10-CM

## 2016-11-27 DIAGNOSIS — N429 Disorder of prostate, unspecified: Secondary | ICD-10-CM

## 2016-11-27 DIAGNOSIS — K769 Liver disease, unspecified: Secondary | ICD-10-CM | POA: Diagnosis not present

## 2016-11-27 DIAGNOSIS — Z23 Encounter for immunization: Secondary | ICD-10-CM

## 2016-11-27 DIAGNOSIS — I10 Essential (primary) hypertension: Secondary | ICD-10-CM

## 2016-11-27 DIAGNOSIS — Z0000 Encounter for general adult medical examination without abnormal findings: Secondary | ICD-10-CM

## 2016-11-27 DIAGNOSIS — I63419 Cerebral infarction due to embolism of unspecified middle cerebral artery: Secondary | ICD-10-CM

## 2016-11-27 DIAGNOSIS — K219 Gastro-esophageal reflux disease without esophagitis: Secondary | ICD-10-CM

## 2016-11-27 DIAGNOSIS — E785 Hyperlipidemia, unspecified: Secondary | ICD-10-CM | POA: Diagnosis not present

## 2016-11-27 DIAGNOSIS — Z125 Encounter for screening for malignant neoplasm of prostate: Secondary | ICD-10-CM

## 2016-11-27 MED ORDER — SILDENAFIL CITRATE 100 MG PO TABS: 50.0000 mg | ORAL_TABLET | Freq: Every day | ORAL | 1 refills | Status: AC | PRN

## 2016-11-27 NOTE — Progress Notes (Addendum)
Subjective:  By signing my name below, I, Zachary Woods, attest that this documentation has been prepared under the direction and in the presence of Zachary Staggers, MD. Electronically Signed: Stann Woods, Scribe. 11/27/2016 , 2:12 PM .  Patient was seen in Room 10 .   Patient ID: Zachary Woods, male    DOB: 01-Sep-1965, 51 y.o.   MRN: [id number] Chief Complaint  Patient presents with  . Annual Exam   HPI Zachary Woods is a 51 y.o. male Here for annual physical. He has a history of HTN, GERD and PFO with CBA.   He works as a Curator at [employer].   HTN His BP was 148/90 at his last visit with me in June. His BP was 137/93 last month with neuro. His amlodipine was increased to 5mg  QD. BP goal of 130/90 with history of stroke.   He doesn't check his BP at home, but occasionally has checked his BP at [facility] few times, running around 130/90. He's still drinking about 4 alcoholic beverages a week.   Lipid screening Lab Results  Component Value Date   CHOL 162 07/20/2016   HDL 49 07/20/2016   LDLCALC 68 07/20/2016   TRIG 227 (H) 07/20/2016   CHOLHDL 3.3 07/20/2016   Lab Results  Component Value Date   ALT 47 07/19/2016   AST 46 (H) 07/19/2016   ALKPHOS 44 07/19/2016   BILITOT 0.9 07/19/2016   He takes atorvastatin 20mg  QD.   History of stroke He has history of cerebral embolism with CBA on May 19th; thought to be due to PFO, which was closed on June 1st. There was no apparent residual weakness. He was having slight lag with thinking, but was improving when last discussed. He was continued on Plavix and aspirin by neurology. Apparently, his hypercoagulable work up was positive with beta 2 glycoprotein IgG. Recommended to repeat this test today. Plan for repeat bubble study for PFO at 6 months from procedure. Cardiology follow up on July 18th, notes reviewed; plan for follow up in Jan 2019 with Dr. Excell Seltzer.   GERD He takes Protonix 4-5 days per week, sometimes every  other day. Controlled in June.   He switched to taking Protonix on Mondays, Wednesdays and Fridays. Then, switched to just Tuesday and Thursday. He states he's doing well with just 2 days a week.   Abnormal CT of liver There was a 10mm hypodense lesion on his left lobe of liver. Recommended 3 month repeat CT. Possible fatty infiltration. He was previously drinking 3-4 alcoholic beverages a night, but had decreased to 4 drinks per week when seen last June.   He's still drinking about 4 alcoholic beverages a week. He denies family history of liver cancer.   Cancer Screening Colonoscopy: He hasn't had this done; received referral today.  Prostate cancer screening: He hasn't had this done.   Immunizations  There is no immunization history on file for this patient.   Tetanus: He agrees to update tdap today.  Flu shot: He agrees to update flu shot today.   Depression Depression screen [facility] 2/9 11/27/2016 08/25/2016  Decreased Interest 0 0  Down, Depressed, Hopeless 0 0  PHQ - 2 Score 0 0    Vision  Visual Acuity Screening   Right eye Left eye Both eyes  Without correction: 20/50 20/30 20/30   With correction:      He last saw eye doctor last year, and is planning to schedule an appointment soon.  Dentist He wears dentures.   Exercise He does a lot of physical work and walking at work (about 5 miles of walking a day, and lifting metal). He denies chest pain or shortness of breath. He does yard work at home as well.   Functional Status Survey: Is the patient deaf or have difficulty hearing?: No Does the patient have difficulty seeing, even when wearing glasses/contacts?: No Does the patient have difficulty concentrating, remembering, or making decisions?: No Does the patient have difficulty walking or climbing stairs?: No Does the patient have difficulty dressing or bathing?: No Does the patient have difficulty doing errands alone such as visiting a doctor's office or shopping?:  No  Erectile dysfunction Patient reports having trouble achieving erections, ongoing for about a year now, but worsened recently. He isn't sure if it's getting old, the recent events and/or medications. He mentions unable to achieve morning erections. He is married and it's doing well.   Patient Active Problem List   Diagnosis Date Noted  . PFO (patent foramen ovale)   . Cerebral embolism with cerebral infarction 07/20/2016  . Stroke determined by clinical assessment (HCC) 07/20/2016  . Hypertension 07/19/2016  . GERD (gastroesophageal reflux disease) 07/19/2016  . Malignant hypertension 07/19/2016   Past Medical History:  Diagnosis Date  . Acid reflux   . CVA (cerebral vascular accident) (HCC)   . Hypertension    Past Surgical History:  Procedure Laterality Date  . NO PAST SURGERIES    . PATENT FORAMEN OVALE(PFO) CLOSURE N/A 08/01/2016   Procedure: Patent Forament [place](PFO) Closure;  Surgeon: Tonny Bollman, MD;  Location[facility] INVASIVE CV LAB;  Service: Cardiovascular;  Laterality: N/A;  . TEE WITHOUT CARDIOVERSION N/A 07/21/2016   Procedure: TRANSESOPHAGEAL ECHOCARDIOGRAM (TEE);  Surgeon: Lars Masson, MD;  Location: [facility] ENDOSCOPY;  Service: Cardiovascular;  Laterality: N/A;   Allergies  Allergen Reactions  . Penicillins Itching    Has patient had a PCN reaction causing immediate rash, facial/tongue/throat swelling, SOB or lightheadedness with hypotension: No Has patient had a PCN reaction causing severe rash involving mucus membranes or skin necrosis: No Has patient had a PCN reaction that required hospitalization: No Has patient had a PCN reaction occurring within the last 10 years: Yes If all of the above answers are "NO", then may proceed with Cephalosporin use.   Prior to Admission medications   Medication Sig Start Date End Date Taking? Authorizing Provider  amLODipine (NORVASC) 5 MG tablet Take 1 tablet (5 mg total) by mouth daily. 08/25/16   Shade Flood, MD    aspirin 81 MG tablet Take 1 tablet (81 mg total) by mouth daily. 08/02/16   Bhagat, Sharrell Ku, PA  atorvastatin (LIPITOR) 20 MG tablet TAKE 1 TABLET (20 MG TOTAL) BY MOUTH DAILY AT 6 PM. 11/19/16   Shade Flood, MD  clopidogrel (PLAVIX) 75 MG tablet Take 1 tablet (75 mg total) by mouth daily. 07/23/16 07/23/17  Tonny Bollman, MD  Multiple Vitamin (MULTIVITAMIN WITH MINERALS) TABS tablet Take 1 tablet by mouth daily.    [provider]  pantoprazole (PROTONIX) 40 MG tablet Take 40 mg by mouth every other day.    [provider]  tetrahydrozoline 0.05 % ophthalmic solution Place 2 drops into both eyes 2 (two) times daily as needed (dry eyes). Visine    [provider]   Social History   Social History  . Marital status: Single    Spouse name: N/A  . Number of children: N/A  . Years of  education: N/A   Occupational History  . Curator    Social History Main Topics  . Smoking status: Former Games developer  . Smokeless tobacco: Never Used  . Alcohol use 0.6 oz/week    1 Shots of liquor per week     Comment: 10-12/ week-- (2-4 drinks/ day)  . Drug use: No  . Sexual activity: Yes   Other Topics Concern  . Not on file   Social History Narrative  . No narrative on file   Review of Systems 13 point ROS - negative     Objective:   Physical Exam  Constitutional: He is oriented to person, place, and time. He appears well-developed and well-nourished.  HENT:  Head: Normocephalic and atraumatic.  Right Ear: External ear normal.  Left Ear: External ear normal.  Mouth/Throat: Oropharynx is clear and moist.  Eyes: Pupils are equal, round, and reactive to light. Conjunctivae and EOM are normal.  Neck: Normal range of motion. Neck supple. No thyromegaly present.  Cardiovascular: Normal rate, regular rhythm, normal heart sounds and intact distal pulses.   Pulmonary/Chest: Effort normal and breath sounds normal. No respiratory distress. He has no wheezes.   Abdominal: Soft. He exhibits no distension. There is no tenderness. Hernia confirmed negative in the right inguinal area and confirmed negative in the left inguinal area.  Genitourinary: Prostate is not tender.  Genitourinary Comments: Slightly prominent left side of prostate, firm, nontender on exam.    Musculoskeletal: Normal range of motion. He exhibits no edema or tenderness.  Lymphadenopathy:    He has no cervical adenopathy.  Neurological: He is alert and oriented to person, place, and time. He has normal reflexes.  Skin: Skin is warm and dry.  Psychiatric: He has a normal mood and affect. His behavior is normal.  Vitals reviewed.    Vitals:   11/27/16 1334  Pulse: 86  Resp: 16  Temp: 98.8 F (37.1 C)  SpO2: 98%  Weight: 169 lb 12.8 oz (77 kg)  Height: 5\' 8"  (1.727 m)      Assessment & Plan:    MIGUEL CHRISTIANA is a 51 y.o. male Annual physical exam  - -anticipatory guidance as below in AVS, screening labs above. Health maintenance items as above in HPI discussed/recommended as applicable.   Liver disease - Plan: CT Abdomen Pelvis W Contrast, Comprehensive metabolic panel  - Irregular density on previous CT. Repeat CT given prior alcohol use.  Depending on results may need MRI or evaluation with gastroenterologist.  Cerebrovascular accident (CVA) due to embolic occlusion of middle cerebral artery (HCC) - Plan: Beta-2 Glycoprotein I Ab, IgG  - Status post CVA due to likely embolism from patent foramen ovale, now repaired. No significant residual deficit. Possible positive beta 2 glycoprotein antibody. Repeat testing.  Essential hypertension  - Tolerating current medications, monitor at home.  Gastroesophageal reflux disease, esophagitis presence not specified  - Stable with spacing out PPI. Continue trigger avoidance.  Screening for prostate cancer - Plan: PSA  - We discussed pros and cons of prostate cancer screening, and after this discussion, he chose to have  screening done. PSA obtained, and no concerning findings on DRE.   Needs flu shot - Plan: Flu Vaccine QUAD 36+ mos IM  Need for Tdap vaccination - Plan: Tdap vaccine greater than or equal to 7yo IM  Screen for colon cancer - Plan: Ambulatory referral to Gastroenterology  Erectile dysfunction, unspecified erectile dysfunction type - Plan: sildenafil (VIAGRA) 100 MG tablet, Ambulatory referral to Urology Prostate  irregularity - Plan: Ambulatory referral to Urology  - Possible nodule versus irregular left versus right prostate. Will check PSA, but refer to urology for repeat exam/evaluation.  - Prescription for Viagra with lowest effective dose discussed, did recommend he discuss that with his cardiologist and neurologist to make sure they're okay with him taking it.  - Side effects discussed (including but not limited to headache/flushing, blue discoloration of vision, possible vascular steal and risk of cardiac effects if underlying unknown coronary artery disease, and permanent sensorineural hearing loss). Understanding expressed.    Hyperlipidemia, unspecified hyperlipidemia type - Plan: Comprehensive metabolic panel, Lipid panel  - Tolerating Lipitor, check labs. No dose changes at present    Meds ordered this encounter  Medications  . sildenafil (VIAGRA) 100 MG tablet    Sig: Take 0.5-1 tablets (50-100 mg total) by mouth daily as needed for erectile dysfunction.    Dispense:  5 tablet    Refill:  1   Patient Instructions   Keep a record of your blood pressures outside of the office. Goal blood pressure less than 130/80.   I will write for Viagra - 1/4 to 1/2 about 30 minutes prior to intercourse. Make sure your cardiologist and neurologist are ok with you taking that medicine.  If new side effects, stop that med. Return to discuss further in 1 month (morning visit so we can check testosterone).   I will check the prostate blood test. As we discussed the left side of your prostate  had a possible firm area or slightly different from the right side. I would like you to meet with the urologist to recheck that area, but we'll also check the PSA blood tests. I will refer you.  You can also discuss the difficulty with erections with urology.   Keeping you healthy  Get these tests  Blood pressure- Have your blood pressure checked once a year by your healthcare provider.  Normal blood pressure is 120/80  Weight- Have your body mass index (BMI) calculated to screen for obesity.  BMI is a measure of body fat based on height and weight. You can also calculate your own BMI at [email or web address].  Cholesterol- Have your cholesterol checked every year.  Diabetes- Have your blood sugar checked regularly if you have high blood pressure, high cholesterol, have a family history of diabetes or if you are overweight.  Screening for Colon Cancer- Colonoscopy starting at age 71.  Screening may begin sooner depending on your family history and other health conditions. Follow up colonoscopy as directed by your Gastroenterologist.  Screening for Prostate Cancer- Both blood work (PSA) and a rectal exam help screen for Prostate Cancer.  Screening begins at age 10 with African-American men and at age 59 with Caucasian men.  Screening may begin sooner depending on your family history.  Take these medicines  Aspirin- One aspirin daily can help prevent Heart disease and Stroke.  Flu shot- Every fall.  Tetanus- Every 10 years.  Zostavax- Once after the age of 24 to prevent Shingles.  Pneumonia shot- Once after the age of 50; if you are younger than 3, ask your healthcare provider if you need a Pneumonia shot.  Take these steps  Don't smoke- If you do smoke, talk to your doctor about quitting.  For tips on how to quit, go to www.smokefree.gov or call 1-800-QUIT-NOW.  Be physically active- Exercise 5 days a week for at least 30 minutes.  If you are not already physically active start  slow and  gradually work up to 30 minutes of moderate physical activity.  Examples of moderate activity include walking briskly, mowing the yard, dancing, swimming, bicycling, etc.  Eat a healthy diet- Eat a variety of healthy food such as fruits, vegetables, low fat milk, low fat cheese, yogurt, lean meant, poultry, fish, beans, tofu, etc. For more information go to www.thenutritionsource.org  Drink alcohol in moderation- Limit alcohol intake to less than two drinks a day. Never drink and drive.  Dentist- Brush and floss twice daily; visit your dentist twice a year.  Depression- Your emotional health is as important as your physical health. If you're feeling down, or losing interest in things you would normally enjoy please talk to your healthcare provider.  Eye exam- Visit your eye doctor every year.  Safe sex- If you may be exposed to a sexually transmitted infection, use a condom.  Seat belts- Seat belts can save your life; always wear one.  Smoke/Carbon Monoxide detectors- These detectors need to be installed on the appropriate level of your home.  Replace batteries at least once a year.  Skin cancer- When out in the sun, cover up and use sunscreen 15 SPF or higher.  Violence- If anyone is threatening you, please tell your healthcare provider.  Living Will/ Health care power of attorney- Speak with your healthcare provider and family.  Erectile Dysfunction Erectile dysfunction (ED) is the inability to get or keep an erection in order to have sexual intercourse. Erectile dysfunction may include:  Inability to get an erection.  Lack of enough hardness of the erection to allow penetration.  Loss of the erection before sex is finished.  What are the causes? This condition may be caused by:  Certain medicines, such as: ? Pain relievers. ? Antihistamines. ? Antidepressants. ? Blood pressure medicines. ? Water pills (diuretics). ? Ulcer medicines. ? Muscle  relaxants. ? Drugs.  Excessive drinking.  Psychological causes, such as: ? Anxiety. ? Depression. ? Sadness. ? Exhaustion. ? Performance fear. ? Stress.  Physical causes, such as: ? Artery problems. This may include diabetes, smoking, liver disease, or atherosclerosis. ? High blood pressure. ? Hormonal problems, such as low testosterone. ? Obesity. ? Nerve problems. This may include back or pelvic injuries, diabetes mellitus, multiple sclerosis, or Parkinson disease.  What are the signs or symptoms? Symptoms of this condition include:  Inability to get an erection.  Lack of enough hardness of the erection to allow penetration.  Loss of the erection before sex is finished.  Normal erections at some times, but with frequent unsatisfactory episodes.  Low sexual satisfaction in either partner due to erection problems.  A curved penis occurring with erection. The curve may cause pain or the penis may be too curved to allow for intercourse.  Never having nighttime erections.  How is this diagnosed? This condition is often diagnosed by:  Performing a physical exam to find other diseases or specific problems with the penis.  Asking you detailed questions about the problem.  Performing blood tests to check for diabetes mellitus or to measure hormone levels.  Performing other tests to check for underlying health conditions.  Performing an ultrasound exam to check for scarring.  Performing a test to check blood flow to the penis.  Doing a sleep study at home to measure nighttime erections.  How is this treated? This condition may be treated by:  Medicine taken by mouth to help you achieve an erection (oral medicine).  Hormone replacement therapy to replace low testosterone levels.  Medicine  that is injected into the penis. Your health care provider may instruct you how to give yourself these injections at home.  Vacuum pump. This is a pump with a ring on it. The  pump and ring are placed on the penis and used to create pressure that helps the penis become erect.  Penile implant surgery. In this procedure, you may receive: ? An inflatable implant. This consists of cylinders, a pump, and a reservoir. The cylinders can be inflated with a fluid that helps to create an erection, and they can be deflated after intercourse. ? A semi-rigid implant. This consists of two silicone rubber rods. The rods provide some rigidity. They are also flexible, so the penis can both curve downward in its normal position and become straight for sexual intercourse.  Blood vessel surgery, to improve blood flow to the penis. During this procedure, a blood vessel from a different part of the body is placed into the penis to allow blood to flow around (bypass) damaged or blocked blood vessels.  Lifestyle changes, such as exercising more, losing weight, and quitting smoking.  Follow these instructions at home: Medicines  Take over-the-counter and prescription medicines only as told by your health care provider. Do not increase the dosage without first discussing it with your health care provider.  If you are using self-injections, perform injections as directed by your health care provider. Make sure to avoid any veins that are on the surface of the penis. After giving an injection, apply pressure to the injection site for 5 minutes. General instructions  Exercise regularly, as directed by your health care provider. Work with your health care provider to lose weight, if needed.  Do not use any products that contain nicotine or tobacco, such as cigarettes and e-cigarettes. If you need help quitting, ask your health care provider.  Before using a vacuum pump, read the instructions that come with the pump and discuss any questions with your health care provider.  Keep all follow-up visits as told by your health care provider. This is important. Contact a health care provider  if:  You feel nauseous.  You vomit. Get help right away if:  You are taking oral or injectable medicines and you have an erection that lasts longer than 4 hours. If your health care provider is unavailable, go to the nearest emergency room for evaluation. An erection that lasts much longer than 4 hours can result in permanent damage to your penis.  You have severe pain in your groin or abdomen.  You develop redness or severe swelling of your penis.  You have redness spreading up into your groin or lower abdomen.  You are unable to urinate.  You experience chest pain or a rapid heart beat (palpitations) after taking oral medicines. Summary  Erectile dysfunction (ED) is the inability to get or keep an erection during sexual intercourse. This problem can usually be treated successfully.  This condition is diagnosed based on a physical exam, your symptoms, and tests to determine the cause. Treatment varies depending on the cause, and may include medicines, hormone therapy, surgery, or vacuum pump.  You may need follow-up visits to make sure that you are using your medicines or devices correctly.  Get help right away if you are taking or injecting medicines and you have an erection that lasts longer than 4 hours. This information is not intended to replace advice given to you by your health care provider. Make sure you discuss any questions you have with your  health care provider. Document Released: 02/15/2000 Document Revised: 03/05/2016 Document Reviewed: 03/05/2016 Elsevier Interactive Patient Education  2017 [employer].    IF you received an x-ray today, you will receive an invoice from [facility]. Please contact [facility] at [phone] with questions or concerns regarding your invoice.   IF you received labwork today, you will receive an invoice from [facility]. Please contact [facility] at [phone] with questions or concerns regarding your invoice.    Our billing staff will not be able to assist you with questions regarding bills from these companies.  You will be contacted with the lab results as soon as they are available. The fastest way to get your results is to activate your My Chart account. Instructions are located on the last page of this paperwork. If you have not heard from [place] regarding the results in 2 weeks, please contact this office.      I personally performed the services described in this documentation, which was scribed in my presence. The recorded information has been reviewed and considered for accuracy and completeness, addended by me as needed, and agree with information above.  Signed,   Zachary Staggers, MD Primary [facility] at [facility].  11/28/16 8:12 AM

## 2016-11-27 NOTE — Patient Instructions (Addendum)
Keep a record of your blood pressures outside of the office. Goal blood pressure less than 130/80.   I will write for Viagra - 1/4 to 1/2 about 30 minutes prior to intercourse. Make sure your cardiologist and neurologist are ok with you taking that medicine.  If new side effects, stop that med. Return to discuss further in 1 month (morning visit so we can check testosterone).   I will check the prostate blood test. As we discussed the left side of your prostate had a possible firm area or slightly different from the right side. I would like you to meet with the urologist to recheck that area, but we'll also check the PSA blood tests. I will refer you.  You can also discuss the difficulty with erections with urology.   Keeping you healthy  Get these tests  Blood pressure- Have your blood pressure checked once a year by your healthcare provider.  Normal blood pressure is 120/80  Weight- Have your body mass index (BMI) calculated to screen for obesity.  BMI is a measure of body fat based on height and weight. You can also calculate your own BMI at [email or web address].  Cholesterol- Have your cholesterol checked every year.  Diabetes- Have your blood sugar checked regularly if you have high blood pressure, high cholesterol, have a family history of diabetes or if you are overweight.  Screening for Colon Cancer- Colonoscopy starting at age 86.  Screening may begin sooner depending on your family history and other health conditions. Follow up colonoscopy as directed by your Gastroenterologist.  Screening for Prostate Cancer- Both blood work (PSA) and a rectal exam help screen for Prostate Cancer.  Screening begins at age 94 with African-American men and at age 25 with Caucasian men.  Screening may begin sooner depending on your family history.  Take these medicines  Aspirin- One aspirin daily can help prevent Heart disease and Stroke.  Flu shot- Every fall.  Tetanus- Every 10  years.  Zostavax- Once after the age of 38 to prevent Shingles.  Pneumonia shot- Once after the age of 44; if you are younger than 10, ask your healthcare provider if you need a Pneumonia shot.  Take these steps  Don't smoke- If you do smoke, talk to your doctor about quitting.  For tips on how to quit, go to www.smokefree.gov or call 1-800-QUIT-NOW.  Be physically active- Exercise 5 days a week for at least 30 minutes.  If you are not already physically active start slow and gradually work up to 30 minutes of moderate physical activity.  Examples of moderate activity include walking briskly, mowing the yard, dancing, swimming, bicycling, etc.  Eat a healthy diet- Eat a variety of healthy food such as fruits, vegetables, low fat milk, low fat cheese, yogurt, lean meant, poultry, fish, beans, tofu, etc. For more information go to www.thenutritionsource.org  Drink alcohol in moderation- Limit alcohol intake to less than two drinks a day. Never drink and drive.  Dentist- Brush and floss twice daily; visit your dentist twice a year.  Depression- Your emotional health is as important as your physical health. If you're feeling down, or losing interest in things you would normally enjoy please talk to your healthcare provider.  Eye exam- Visit your eye doctor every year.  Safe sex- If you may be exposed to a sexually transmitted infection, use a condom.  Seat belts- Seat belts can save your life; always wear one.  Smoke/Carbon Monoxide detectors- These detectors need to be installed on  the appropriate level of your home.  Replace batteries at least once a year.  Skin cancer- When out in the sun, cover up and use sunscreen 15 SPF or higher.  Violence- If anyone is threatening you, please tell your healthcare provider.  Living Will/ Health care power of attorney- Speak with your healthcare provider and family.  Erectile Dysfunction Erectile dysfunction (ED) is the inability to get or keep  an erection in order to have sexual intercourse. Erectile dysfunction may include:  Inability to get an erection.  Lack of enough hardness of the erection to allow penetration.  Loss of the erection before sex is finished.  What are the causes? This condition may be caused by:  Certain medicines, such as: ? Pain relievers. ? Antihistamines. ? Antidepressants. ? Blood pressure medicines. ? Water pills (diuretics). ? Ulcer medicines. ? Muscle relaxants. ? Drugs.  Excessive drinking.  Psychological causes, such as: ? Anxiety. ? Depression. ? Sadness. ? Exhaustion. ? Performance fear. ? Stress.  Physical causes, such as: ? Artery problems. This may include diabetes, smoking, liver disease, or atherosclerosis. ? High blood pressure. ? Hormonal problems, such as low testosterone. ? Obesity. ? Nerve problems. This may include back or pelvic injuries, diabetes mellitus, multiple sclerosis, or Parkinson disease.  What are the signs or symptoms? Symptoms of this condition include:  Inability to get an erection.  Lack of enough hardness of the erection to allow penetration.  Loss of the erection before sex is finished.  Normal erections at some times, but with frequent unsatisfactory episodes.  Low sexual satisfaction in either partner due to erection problems.  A curved penis occurring with erection. The curve may cause pain or the penis may be too curved to allow for intercourse.  Never having nighttime erections.  How is this diagnosed? This condition is often diagnosed by:  Performing a physical exam to find other diseases or specific problems with the penis.  Asking you detailed questions about the problem.  Performing blood tests to check for diabetes mellitus or to measure hormone levels.  Performing other tests to check for underlying health conditions.  Performing an ultrasound exam to check for scarring.  Performing a test to check blood flow to the  penis.  Doing a sleep study at home to measure nighttime erections.  How is this treated? This condition may be treated by:  Medicine taken by mouth to help you achieve an erection (oral medicine).  Hormone replacement therapy to replace low testosterone levels.  Medicine that is injected into the penis. Your health care provider may instruct you how to give yourself these injections at home.  Vacuum pump. This is a pump with a ring on it. The pump and ring are placed on the penis and used to create pressure that helps the penis become erect.  Penile implant surgery. In this procedure, you may receive: ? An inflatable implant. This consists of cylinders, a pump, and a reservoir. The cylinders can be inflated with a fluid that helps to create an erection, and they can be deflated after intercourse. ? A semi-rigid implant. This consists of two silicone rubber rods. The rods provide some rigidity. They are also flexible, so the penis can both curve downward in its normal position and become straight for sexual intercourse.  Blood vessel surgery, to improve blood flow to the penis. During this procedure, a blood vessel from a different part of the body is placed into the penis to allow blood to flow around (bypass)  damaged or blocked blood vessels.  Lifestyle changes, such as exercising more, losing weight, and quitting smoking.  Follow these instructions at home: Medicines  Take over-the-counter and prescription medicines only as told by your health care provider. Do not increase the dosage without first discussing it with your health care provider.  If you are using self-injections, perform injections as directed by your health care provider. Make sure to avoid any veins that are on the surface of the penis. After giving an injection, apply pressure to the injection site for 5 minutes. General instructions  Exercise regularly, as directed by your health care provider. Work with your  health care provider to lose weight, if needed.  Do not use any products that contain nicotine or tobacco, such as cigarettes and e-cigarettes. If you need help quitting, ask your health care provider.  Before using a vacuum pump, read the instructions that come with the pump and discuss any questions with your health care provider.  Keep all follow-up visits as told by your health care provider. This is important. Contact a health care provider if:  You feel nauseous.  You vomit. Get help right away if:  You are taking oral or injectable medicines and you have an erection that lasts longer than 4 hours. If your health care provider is unavailable, go to the nearest emergency room for evaluation. An erection that lasts much longer than 4 hours can result in permanent damage to your penis.  You have severe pain in your groin or abdomen.  You develop redness or severe swelling of your penis.  You have redness spreading up into your groin or lower abdomen.  You are unable to urinate.  You experience chest pain or a rapid heart beat (palpitations) after taking oral medicines. Summary  Erectile dysfunction (ED) is the inability to get or keep an erection during sexual intercourse. This problem can usually be treated successfully.  This condition is diagnosed based on a physical exam, your symptoms, and tests to determine the cause. Treatment varies depending on the cause, and may include medicines, hormone therapy, surgery, or vacuum pump.  You may need follow-up visits to make sure that you are using your medicines or devices correctly.  Get help right away if you are taking or injecting medicines and you have an erection that lasts longer than 4 hours. This information is not intended to replace advice given to you by your health care provider. Make sure you discuss any questions you have with your health care provider. Document Released: 02/15/2000 Document Revised: 03/05/2016  Document Reviewed: 03/05/2016 Elsevier Interactive Patient Education  2017 [employer].    IF you received an x-ray today, you will receive an invoice from [facility]. Please contact [facility] at [phone] with questions or concerns regarding your invoice.   IF you received labwork today, you will receive an invoice from [place]. Please contact [facility] at [phone] with questions or concerns regarding your invoice.   Our billing staff will not be able to assist you with questions regarding bills from these companies.  You will be contacted with the lab results as soon as they are available. The fastest way to get your results is to activate your My Chart account. Instructions are located on the last page of this paperwork. If you have not heard from [place] regarding the results in 2 weeks, please contact this office.

## 2016-11-28 LAB — COMPREHENSIVE METABOLIC PANEL
ALT: 77 IU/L — AB (ref 0–44)
AST: 75 IU/L — AB (ref 0–40)
Albumin/Globulin Ratio: 2.3 — ABNORMAL HIGH (ref 1.2–2.2)
Albumin: 5 g/dL (ref 3.5–5.5)
Alkaline Phosphatase: 55 IU/L (ref 39–117)
BILIRUBIN TOTAL: 0.4 mg/dL (ref 0.0–1.2)
BUN/Creatinine Ratio: 12 (ref 9–20)
BUN: 9 mg/dL (ref 6–24)
CALCIUM: 9.5 mg/dL (ref 8.7–10.2)
CHLORIDE: 94 mmol/L — AB (ref 96–106)
CO2: 27 mmol/L (ref 20–29)
Creatinine, Ser: 0.78 mg/dL (ref 0.76–1.27)
GFR calc non Af Amer: 105 mL/min/{1.73_m2} (ref >59)
GFR, EST AFRICAN AMERICAN: 122 mL/min/{1.73_m2} (ref >59)
GLUCOSE: 131 mg/dL — AB (ref 65–99)
Globulin, Total: 2.2 g/dL (ref 1.5–4.5)
Potassium: 4.5 mmol/L (ref 3.5–5.2)
Sodium: 139 mmol/L (ref 134–144)
TOTAL PROTEIN: 7.2 g/dL (ref 6.0–8.5)

## 2016-11-28 LAB — LIPID PANEL
Chol/HDL Ratio: 5.1 ratio — ABNORMAL HIGH (ref 0.0–5.0)
Cholesterol, Total: 213 mg/dL — ABNORMAL HIGH (ref 100–199)
HDL: 42 mg/dL (ref >39)
Triglycerides: 796 mg/dL (ref 0–149)

## 2016-11-28 LAB — BETA-2 GLYCOPROTEIN I AB, IGG: BETA 2 GLYCO I IGG: 90 GPI IgG units — AB (ref 0–20)

## 2016-11-28 LAB — PSA: Prostate Specific Ag, Serum: 0.6 ng/mL (ref 0.0–4.0)

## 2016-12-03 ENCOUNTER — Other Ambulatory Visit: Payer: Self-pay | Admitting: Family Medicine

## 2016-12-03 DIAGNOSIS — R739 Hyperglycemia, unspecified: Secondary | ICD-10-CM

## 2016-12-03 DIAGNOSIS — I63419 Cerebral infarction due to embolism of unspecified middle cerebral artery: Secondary | ICD-10-CM

## 2016-12-03 DIAGNOSIS — R945 Abnormal results of liver function studies: Secondary | ICD-10-CM

## 2016-12-03 DIAGNOSIS — R7989 Other specified abnormal findings of blood chemistry: Secondary | ICD-10-CM

## 2016-12-03 DIAGNOSIS — E781 Pure hyperglyceridemia: Secondary | ICD-10-CM

## 2016-12-03 NOTE — Progress Notes (Signed)
See lab notes

## 2016-12-24 ENCOUNTER — Other Ambulatory Visit: Payer: Self-pay | Admitting: Family Medicine

## 2017-01-06 ENCOUNTER — Telehealth: Payer: Self-pay

## 2017-01-06 DIAGNOSIS — Q2112 Patent foramen ovale: Secondary | ICD-10-CM

## 2017-01-06 DIAGNOSIS — Q211 Atrial septal defect: Secondary | ICD-10-CM

## 2017-01-06 NOTE — Telephone Encounter (Signed)
I have left the pt multiple voicemails to contact me by phone to arrange 6 month PFO follow-up.  The pt has not returned my calls at this time.  I have tentatively scheduled this pt for Limited Echo with bubble study and OV on 01/28/17.  I have mailed the pt a list of appointments.

## 2017-01-09 ENCOUNTER — Encounter: Payer: Self-pay | Admitting: Family Medicine

## 2017-01-26 NOTE — Progress Notes (Deleted)
[facility]   MULTIDISCIPLINARY HEART VALVE CLINIC                                       Cardiology Office Note    Date:  01/26/2017   ID:  Zachary HailJames S Devos, DOB [birth year], MRN [id number]  PCP:  Shade FloodGreene, Jeffrey R, MD  Cardiologist: Dr. Excell Seltzerooper  CC: 6 month follow up s/p PFO closure with echo/bubble study.   History of Present Illness:  Zachary Woods is a 51 y.o. male with a history of HTN, GERD, cryptogenic CVA, PFO s/p PFO closure (08/2016) who presents to clinic for 6 month follow up.   The patient had [place] that are occurred in embolic pattern in 07/2016. Stroke work up included MRA head and neck, LE venous doppler, TTE, pan CT which were all negative. However, TCD bubble study showed small PFO with valsalva and TEE showed positive bubble study consistent with PFO. LDL 68 and A1C 5.6. Hypercoagulable work up only positive for beta 2 glycoprotein IgG.  He was referred to Dr. Excell Seltzerooper by Dr. Roda ShuttersXu. He underwent successful transcatheter PFO closure using ultrasound and fluoroscopic guidance (25 mm Amplatzer PFO Occluder) on 08/01/16.   He saw Dr. Roda ShuttersXu in 10/2016. Plan is for repeat TCD bubble studies around now. He will see Dr. Roda ShuttersXu back 12/11.   Today he presents to clinic for follow up.    Past Medical History:  Diagnosis Date  . Acid reflux   . CVA (cerebral vascular accident) (HCC)   . Hypertension     Past Surgical History:  Procedure Laterality Date  . NO PAST SURGERIES    . PATENT FORAMEN OVALE(PFO) CLOSURE N/A 08/01/2016   Procedure: Patent Forament [place](PFO) Closure;  Surgeon: Tonny Bollmanooper, Michael, MD;  [facility];  Service: Cardiovascular;  Laterality: N/A;  . TEE WITHOUT CARDIOVERSION N/A 07/21/2016   Procedure: TRANSESOPHAGEAL ECHOCARDIOGRAM (TEE);  Surgeon: Lars MassonNelson, Katarina H, MD;  [facility];  Service: Cardiovascular;  Laterality: N/A;    Current Medications: Outpatient Medications Prior to Visit  Medication Sig  Dispense Refill  . amLODipine (NORVASC) 5 MG tablet Take 1 tablet (5 mg total) by mouth daily. 90 tablet 1  . aspirin 81 MG tablet Take 1 tablet (81 mg total) by mouth daily. 30 tablet 6  . atorvastatin (LIPITOR) 20 MG tablet TAKE 1 TABLET BY MOUTH DAILY AT 6 PM. 30 tablet 0  . clopidogrel (PLAVIX) 75 MG tablet Take 1 tablet (75 mg total) by mouth daily. 30 tablet 6  . Multiple Vitamin (MULTIVITAMIN WITH MINERALS) TABS tablet Take 1 tablet by mouth daily.    . pantoprazole (PROTONIX) 40 MG tablet Take 40 mg by mouth every other day.    . sildenafil (VIAGRA) 100 MG tablet Take 0.5-1 tablets (50-100 mg total) by mouth daily as needed for erectile dysfunction. 5 tablet 1  . tetrahydrozoline 0.05 % ophthalmic solution Place 2 drops into both eyes 2 (two) times daily as needed (dry eyes). Vis[place]     No facility-administered medications prior to visit.      Allergies:   Penicillins   Social History   Socioeconomic History  . Marital status: Single    Spouse name: Not on file  . Number of children: Not on file  . Years of education: Not on file  . Highest education level: Not on file  Social  Needs  . Financial resource strain: Not on file  . Food insecurity - worry: Not on file  . Food insecurity - inability: Not on file  . Transportation needs - medical: Not on file  . Transportation needs - non-medical: Not on file  Occupational History  . Occupation: Curatormechanic  Tobacco Use  . Smoking status: Former Games developermoker  . Smokeless tobacco: Never Used  Substance and Sexual Activity  . Alcohol use: Yes    Alcohol/week: 0.6 oz    Types: 1 Shots of liquor per week    Comment: 10-12/ week-- (2-4 drinks/ day)  . Drug use: No  . Sexual activity: Yes  Other Topics Concern  . Not on file  Social History Narrative  . Not on file     Family History:  The patient's ***family history includes Diabetes in his brother and father.      *** ROS/PE    Wt Readings from Last 3 Encounters:  11/27/16  169 lb 12.8 oz (77 kg)  10/07/16 162 lb 3.2 oz (73.6 kg)  09/17/16 163 lb 12.8 oz (74.3 kg)      Studies/Labs Reviewed:   EKG:  EKG is*** ordered today.  The ekg ordered today demonstrates ***  Recent Labs: 07/21/2016: Hemoglobin 12.5; Platelets 183 08/25/2016: Magnesium 2.0; TSH 1.620 11/27/2016: ALT 77; BUN 9; Creatinine, Ser 0.78; Potassium 4.5; Sodium 139   Lipid Panel    Component Value Date/Time   CHOL 213 (H) 11/27/2016 1432   TRIG 796 (HH) 11/27/2016 1432   HDL 42 11/27/2016 1432   CHOLHDL 5.1 (H) 11/27/2016 1432   CHOLHDL 3.3 07/20/2016 0427   VLDL 45 (H) 07/20/2016 0427   LDLCALC Comment 11/27/2016 1432    Additional studies/ records that were reviewed today include:  Echocardiogram: 08/02/16 Study Conclusions - Left ventricle: The cavity size was normal. Systolic function was normal. The estimated ejection fraction was in the range of 55% to 60%. There is hypokinesis of the basalinferior myocardium. Doppler parameters are consistent with abnormal left ventricular relaxation (grade 1 diastolic dysfunction). - Atrial septum: An Amplatzer closure device was present. 25mm, intact in proper position. No Doppler shunt signal.  2D ECHO with bubble study: 01/28/17   ASSESSMENT & PLAN:   S/p PFO:  HTN:  Hx of CVA:  Cardiologist: Dr. Excell Seltzerooper     Medication Adjustments/Labs and Tests Ordered: Current medicines are reviewed at length with the patient today.  Concerns regarding medicines are outlined above.  Medication changes, Labs and Tests ordered today are listed in the Patient Instructions below. There are no Patient Instructions on file for this visit.   Signed, Cline CrockKathryn Zyen Triggs, PA-C  01/26/2017 10:57 AM    [facility] [address], [place], [place]  [address] Phone: [phone]; Fax: [phone]

## 2017-01-28 ENCOUNTER — Ambulatory Visit: Payer: 59 | Admitting: Physician Assistant

## 2017-01-28 ENCOUNTER — Telehealth: Payer: Self-pay | Admitting: *Deleted

## 2017-01-28 ENCOUNTER — Other Ambulatory Visit (HOSPITAL_COMMUNITY): Payer: 59

## 2017-01-28 NOTE — Telephone Encounter (Signed)
Appointment for echo today had to be cancelled until approved by insurance.  Echo has now been approved.   Pt can be scheduled for echo tomorrow at 1:00 and appointment with K. [name], PA at 2:00 tomorrow.  I placed call to pt to see if he could come in tomorrow. Left message on mobile and home numbers to call office.

## 2017-01-29 NOTE — Telephone Encounter (Signed)
Rescheduled patient's echo to Wednesday, December 5 at 1130. He understands to arrive at 1100 for appointment.  He understands he will have OV with Zachary JewsKatie Woods after echo at 1230. He agrees with treatment plan.

## 2017-01-29 NOTE — Progress Notes (Signed)
[facility]   MULTIDISCIPLINARY HEART VALVE CLINIC                                       Cardiology Office Note    Date:  02/04/2017   ID:  Zachary Woods, DOB 07/05/1965, MRN [id number]  PCP:  Shade FloodGreene, Jeffrey R, MD  Cardiologist:  Dr. Excell Seltzerooper   CC: 6 month follow up s/p PFO closure with echo/bubble study.   History of Present Illness:  Zachary Woods is a 51 y.o. male with a history of HTN, GERD, cryptogenic CVA, PFO s/p PFO closure (08/2016) who presents to clinic for 6 month follow up.   The patient had a [place] that are occurred in embolic pattern in 07/2016. Stroke work up included MRA head and neck, LE venous doppler, TTE, pan CT which were all negative. However, TCD bubble study showed small PFO with valsalva and TEE showed positive bubble study consistent with PFO. LDL 68 and A1C 5.6. Hypercoagulable work up only positive for beta 2 glycoprotein IgG. He was referred to Dr. Excell Seltzerooper by Dr. Roda ShuttersXu. He underwent successful transcatheter PFO closure using ultrasound and fluoroscopic guidance (25 mm Amplatzer PFO Occluder) on 08/01/16.   He saw Dr. Roda ShuttersXu in 10/2016. Plan is for repeat TCD bubble studies around now. He will see Dr. Roda ShuttersXu back 12/11.   Today he presents to clinic for follow up. No CP or SOB. No LE edema, orthopnea or PND. No dizziness or syncope. No blood in stool or urine. No palpitations. He has just felt somewhat fatigued recently. He does not smoke and occasionally drinks.    Past Medical History:  Diagnosis Date  . Acid reflux   . CVA (cerebral vascular accident) (HCC)   . Hypertension     Past Surgical History:  Procedure Laterality Date  . NO PAST SURGERIES    . PATENT FORAMEN OVALE(PFO) CLOSURE N/A 08/01/2016   Procedure: Patent Forament Ovale(PFO) Closure;  Surgeon: Tonny Bollmanooper, Michael, MD;  [facility];  Service: Cardiovascular;  Laterality: N/A;  . TEE WITHOUT CARDIOVERSION N/A 07/21/2016   Procedure: TRANSESOPHAGEAL  ECHOCARDIOGRAM (TEE);  Surgeon: Lars MassonNelson, Katarina H, MD;  [facility];  Service: Cardiovascular;  Laterality: N/A;    Current Medications: Outpatient Medications Prior to Visit  Medication Sig Dispense Refill  . amLODipine (NORVASC) 5 MG tablet Take 1 tablet (5 mg total) by mouth daily. 90 tablet 1  . aspirin 81 MG tablet Take 1 tablet (81 mg total) by mouth daily. 30 tablet 6  . atorvastatin (LIPITOR) 20 MG tablet TAKE 1 TABLET BY MOUTH DAILY AT 6 PM. 30 tablet 0  . Multiple Vitamin (MULTIVITAMIN WITH MINERALS) TABS tablet Take 1 tablet by mouth daily.    . pantoprazole (PROTONIX) 40 MG tablet Take 40 mg by mouth every other day.    . sildenafil (VIAGRA) 100 MG tablet Take 0.5-1 tablets (50-100 mg total) by mouth daily as needed for erectile dysfunction. 5 tablet 1  . tetrahydrozoline 0.05 % ophthalmic solution Place 2 drops into both eyes 2 (two) times daily as needed (dry eyes). Visine    . clopidogrel (PLAVIX) 75 MG tablet Take 1 tablet (75 mg total) by mouth daily. 30 tablet 6   No facility-administered medications prior to visit.      Allergies:   Penicillins   Social History   Socioeconomic History  .  Marital status: Single    Spouse name: None  . Number of children: None  . Years of education: None  . Highest education level: None  Social Needs  . Financial resource strain: None  . Food insecurity - worry: None  . Food insecurity - inability: None  . Transportation needs - medical: None  . Transportation needs - non-medical: None  Occupational History  . Occupation: Curator  Tobacco Use  . Smoking status: Former Games developer  . Smokeless tobacco: Never Used  Substance and Sexual Activity  . Alcohol use: Yes    Alcohol/week: 0.6 oz    Types: 1 Shots of liquor per week    Comment: 10-12/ week-- (2-4 drinks/ day)  . Drug use: No  . Sexual activity: Yes  Other Topics Concern  . None  Social History Narrative  . None     Family History:  The patient's  family history includes Diabetes in his brother and father.      ROS:   Please see the history of present illness.    ROS All other systems reviewed and are negative.   PHYSICAL EXAM:   VS:  BP (!) 150/90   Pulse 86   Ht 5\' 8"  (1.727 m)   Wt 175 lb (79.4 kg)   SpO2 97%   BMI 26.61 kg/m    GEN: Well nourished, well developed, in no acute distress  HEENT: normal  Neck: no JVD, carotid bruits, or masses Cardiac: RRR; no murmurs, rubs, or gallops,no edema  Respiratory:  clear to auscultation bilaterally, normal work of breathing GI: soft, nontender, nondistended, + BS MS: no deformity or atrophy  Skin: warm and dry, no rash Neuro:  Alert and Oriented x 3, Strength and sensation are intact Psych: euthymic mood, full affect   Wt Readings from Last 3 Encounters:  02/04/17 175 lb (79.4 kg)  11/27/16 169 lb 12.8 oz (77 kg)  10/07/16 162 lb 3.2 oz (73.6 kg)      Studies/Labs Reviewed:   EKG:  EKG is NOT ordered today.    Recent Labs: 07/21/2016: Hemoglobin 12.5; Platelets 183 08/25/2016: Magnesium 2.0; TSH 1.620 11/27/2016: ALT 77; BUN 9; Creatinine, Ser 0.78; Potassium 4.5; Sodium 139   Lipid Panel    Component Value Date/Time   CHOL 213 (H) 11/27/2016 1432   TRIG 796 (HH) 11/27/2016 1432   HDL 42 11/27/2016 1432   CHOLHDL 5.1 (H) 11/27/2016 1432   CHOLHDL 3.3 07/20/2016 0427   VLDL 45 (H) 07/20/2016 0427   LDLCALC Comment 11/27/2016 1432    Additional studies/ records that were reviewed today include:   2D ECHO 08/02/16  Study Conclusions - Left ventricle: The cavity size was normal. Systolic function was   normal. The estimated ejection fraction was in the range of 55%   to 60%. There is hypokinesis of the basalinferior myocardium.   Doppler parameters are consistent with abnormal left ventricular   relaxation (grade 1 diastolic dysfunction). - Atrial septum: An Amplatzer closure device was present. 25mm,   intact in proper position. No Doppler shunt  signal.   2D ECHO 02/04/17 Study Conclusions  - Left ventricle: The cavity size was normal. Wall thickness was   increased in a pattern of mild LVH. The estimated ejection   fraction was 55%. Although no diagnostic regional wall motion   abnormality was identified, this possibility cannot be completely   excluded on the basis of this study. Doppler parameters are   consistent with abnormal left ventricular relaxation (grade 1  diastolic dysfunction). - Right ventricle: The cavity size was normal. Systolic function   was normal. - Atrial septum: S/p PFO closure with well-seated closure device.   Negative bubble study. - Systemic veins: IVC not visualized.  Impressions:  - Limited echo to assess PFO closure device.  ASSESSMENT & PLAN:   PFO s/p closure: doing well since closure. No new neurologic sx.  He can stop plavix and continue on a baby aspirin. Echo with bubble study today shows well seated Amplatzer device with negative bubble study. Plan per Dr. Roda ShuttersXu is for repeat TCD bubble studies around now. I will see him back 6 months with an echo per research protocol.   HTN: BP is elevated today. Dr. Chilton SiGreen recently increased his amlodipine from 2.5mg  to 5mg  daily. I have asked him to follow his BP at home and contact Dr. Chilton SiGreen if it is running consistently >140/90.  Hx of CVA: continue ASA and statin. He see's Dr. Roda ShuttersXu next week.    Medication Adjustments/Labs and Tests Ordered: Current medicines are reviewed at length with the patient today.  Concerns regarding medicines are outlined above.  Medication changes, Labs and Tests ordered today are listed in the Patient Instructions below. Patient Instructions  Medication Instructions:  Your physician has recommended you make the following change in your medication: Stop clopidogrel.     Labwork: none  Testing/Procedures: Your physician has requested that you have an echocardiogram. Echocardiography is a painless test that uses  sound waves to create images of your heart. It provides your doctor with information about the size and shape of your heart and how well your heart's chambers and valves are working. This procedure takes approximately one hour. There are no restrictions for this procedure.  To be done in June 2019 on day of appointment with K. [name], [place]  Follow-Up: Follow up in June 2019 with K. [name], [place].  We will call you to schedule this appointment and the echo appointment  Any Other Special Instructions Will Be Listed Below (If Applicable).   Check blood pressure at home and contact your primary care doctor if blood pressure consistently greater than 140/90  If you need a refill on your cardiac medications before your next appointment, please call your pharmacy.      Signed, Cline CrockKathryn Thompson, PA-C  02/04/2017 1:05 PM    [facility] [address], [place], [place]  [address] Phone: [phone]; Fax: [phone]

## 2017-01-29 NOTE — Telephone Encounter (Signed)
Message received from scheduler: "Hilma Favorsobinson, Billie [11:32 AM]:  Murlean CallerKaty  I have A Mr. Lanae BoastGarner on the phone calling about a Echo that you guys were trying to get him schedule for today . He is not able to come today , but can come one day next week . Dennie Bibleat ask me to reach out to you about this"  The scheduler arranged ECHO appointment for Tuesday, but patient will need to have an OV with Carlean JewsKatie Thompson as well.  Will attempt to reschedule patient to have ECHO at 1130 on Wednesday, December 5 and have OV with Carlean JewsKatie Thompson, PA at 1230 after ECHO is complete.

## 2017-02-02 ENCOUNTER — Encounter: Payer: Self-pay | Admitting: Physician Assistant

## 2017-02-03 ENCOUNTER — Other Ambulatory Visit (HOSPITAL_COMMUNITY): Payer: 59

## 2017-02-04 ENCOUNTER — Encounter: Payer: 59 | Admitting: *Deleted

## 2017-02-04 ENCOUNTER — Other Ambulatory Visit: Payer: Self-pay

## 2017-02-04 ENCOUNTER — Encounter: Payer: Self-pay | Admitting: Physician Assistant

## 2017-02-04 ENCOUNTER — Ambulatory Visit (HOSPITAL_COMMUNITY): Payer: 59 | Attending: Cardiovascular Disease

## 2017-02-04 ENCOUNTER — Ambulatory Visit: Payer: 59 | Admitting: Physician Assistant

## 2017-02-04 VITALS — BP 150/90 | HR 86 | Ht 68.0 in | Wt 175.0 lb

## 2017-02-04 DIAGNOSIS — Q2112 Patent foramen ovale: Secondary | ICD-10-CM

## 2017-02-04 DIAGNOSIS — Z8673 Personal history of transient ischemic attack (TIA), and cerebral infarction without residual deficits: Secondary | ICD-10-CM

## 2017-02-04 DIAGNOSIS — Q211 Atrial septal defect: Secondary | ICD-10-CM

## 2017-02-04 DIAGNOSIS — Z006 Encounter for examination for normal comparison and control in clinical research program: Secondary | ICD-10-CM

## 2017-02-04 DIAGNOSIS — I10 Essential (primary) hypertension: Secondary | ICD-10-CM | POA: Insufficient documentation

## 2017-02-04 NOTE — Patient Instructions (Addendum)
Medication Instructions:  Your physician has recommended you make the following change in your medication: Stop clopidogrel.     Labwork: none  Testing/Procedures: Your physician has requested that you have an echocardiogram. Echocardiography is a painless test that uses sound waves to create images of your heart. It provides your doctor with information about the size and shape of your heart and how well your heart's chambers and valves are working. This procedure takes approximately one hour. There are no restrictions for this procedure.  To be done in June 2019 on day of appointment with K. [name], [place]  Follow-Up: Follow up in June 2019 with K. [name], [place].  We will call you to schedule this appointment and the echo appointment  Any Other Special Instructions Will Be Listed Below (If Applicable).   Check blood pressure at home and contact your primary care doctor if blood pressure consistently greater than 140/90  If you need a refill on your cardiac medications before your next appointment, please call your pharmacy.

## 2017-02-04 NOTE — Progress Notes (Signed)
[name] was seen today for his 6 month post procedure  follow-up. He has been doing well and has had no adverse events .

## 2017-02-10 ENCOUNTER — Ambulatory Visit: Payer: 59 | Admitting: Neurology

## 2017-02-21 ENCOUNTER — Other Ambulatory Visit: Payer: Self-pay | Admitting: Family Medicine

## 2017-02-21 DIAGNOSIS — I10 Essential (primary) hypertension: Secondary | ICD-10-CM

## 2017-03-25 ENCOUNTER — Other Ambulatory Visit: Payer: Self-pay | Admitting: Cardiovascular Disease

## 2017-05-25 ENCOUNTER — Other Ambulatory Visit: Payer: Self-pay | Admitting: Family Medicine

## 2017-05-25 DIAGNOSIS — I10 Essential (primary) hypertension: Secondary | ICD-10-CM

## 2017-06-28 ENCOUNTER — Other Ambulatory Visit: Payer: Self-pay | Admitting: Family Medicine

## 2017-06-28 DIAGNOSIS — I10 Essential (primary) hypertension: Secondary | ICD-10-CM

## 2017-06-30 ENCOUNTER — Telehealth: Payer: Self-pay

## 2017-06-30 NOTE — Telephone Encounter (Signed)
Left message to call back  

## 2017-06-30 NOTE — Telephone Encounter (Signed)
-----   Message from [place] L Chalmers sent at 06/30/2017  9:02 AM EDT ----- Regarding: PFO patient Hi [name] this is the last one:  He just needs an appointment sometime before 10-30-2017. And his echo was done early also. He can see any of the physicians assistants or Dr. Excell Seltzer.   Thanks  [place]

## 2017-07-03 NOTE — Telephone Encounter (Signed)
Scheduled patient for 1 year PFO closure follow-up 6/5 with K. Janee Morn, [place]. Patient agrees with treatment plan.

## 2017-08-04 NOTE — Progress Notes (Signed)
[facility]   MULTIDISCIPLINARY HEART VALVE CLINIC                                       Cardiology Office Note    Date:  08/05/2017   ID:  Zachary Woods, DOB 05-29-1965, MRN [id number]  PCP:  Shade FloodGreene, Jeffrey R, MD  Cardiologist:  Dr. Excell Seltzerooper   CC:  1 year follow up s/p PFO closure   History of Present Illness:  Zachary Woods is a 52 y.o. male with a history of of HTN, GERD, cryptogenic CVA, PFO s/p PFO closure (08/2016) who presents to clinic for 1 year follow up.   The patient had a left MCA [place] stroke that are occurred in embolic pattern in 07/2016. Stroke work up included MRA head and neck, LE venous doppler, TTE, pan CT which were all negative. However, TCD bubble study showed small PFO with valsalva and TEE showed positive bubble study consistent with PFO. LDL 68 and A1C 5.6. Hypercoagulable work up only positive for beta 2 glycoprotein IgG.He was referred to Dr. Excell Seltzerooper by Dr. Roda ShuttersXu. He underwent successful transcatheter PFO closure using ultrasound and fluoroscopic guidance (25 mm Amplatzer PFO Occluder) on 08/01/16.    Repeat echo with bubble study in 01/2017 showed a well seated Amplatzer device with negative bubble study.   Today he presents to clinic for follow up. No CP or SOB. No LE edema, orthopnea or PND. No dizziness or syncope. No blood in stool or urine. No palpitations. No new neurologic symptoms. He stopped his statin due to myalgias.    Past Medical History:  Diagnosis Date  . Acid reflux   . CVA (cerebral vascular accident) (HCC)   . Hypertension     Past Surgical History:  Procedure Laterality Date  . NO PAST SURGERIES    . PATENT FORAMEN OVALE(PFO) CLOSURE N/A 08/01/2016   Procedure: Patent Forament Ovale(PFO) Closure;  Surgeon: Tonny Bollmanooper, Michael, MD;  [facility];  Service: Cardiovascular;  Laterality: N/A;  . TEE WITHOUT CARDIOVERSION N/A 07/21/2016   Procedure: TRANSESOPHAGEAL ECHOCARDIOGRAM (TEE);  Surgeon: Lars MassonNelson, Katarina H,  MD;  [facility];  Service: Cardiovascular;  Laterality: N/A;    Current Medications: Outpatient Medications Prior to Visit  Medication Sig Dispense Refill  . amLODipine (NORVASC) 5 MG tablet Take 1 tablet (5 mg total) by mouth daily. OV NEEDED 15 tablet 0  . Multiple Vitamin (MULTIVITAMIN WITH MINERALS) TABS tablet Take 1 tablet by mouth daily.    . pantoprazole (PROTONIX) 40 MG tablet TAKE 1 TABLET BY MOUTH EVERY DAY 30 tablet 10  . sildenafil (VIAGRA) 100 MG tablet Take 0.5-1 tablets (50-100 mg total) by mouth daily as needed for erectile dysfunction. 5 tablet 1  . aspirin 81 MG tablet Take 1 tablet (81 mg total) by mouth daily. (Patient not taking: Reported on 08/05/2017) 30 tablet 6  . atorvastatin (LIPITOR) 20 MG tablet TAKE 1 TABLET BY MOUTH DAILY AT 6 PM. (Patient not taking: Reported on 08/05/2017) 30 tablet 0  . clopidogrel (PLAVIX) 75 MG tablet TAKE 1 TABLET BY MOUTH EVERY DAY (Patient not taking: Reported on 08/05/2017) 30 tablet 10  . pantoprazole (PROTONIX) 40 MG tablet Take 40 mg by mouth every other day.    . tetrahydrozoline 0.05 % ophthalmic solution Place 2 drops into both eyes 2 (two) times daily as needed (dry eyes). Visine  No facility-administered medications prior to visit.      Allergies:   Penicillins   Social History   Socioeconomic History  . Marital status: Single    Spouse name: Not on file  . Number of children: Not on file  . Years of education: Not on file  . Highest education level: Not on file  Occupational History  . Occupation: Curator  Social Needs  . Financial resource strain: Not on file  . Food insecurity:    Worry: Not on file    Inability: Not on file  . Transportation needs:    Medical: Not on file    Non-medical: Not on file  Tobacco Use  . Smoking status: Former Games developer  . Smokeless tobacco: Never Used  Substance and Sexual Activity  . Alcohol use: Yes    Alcohol/week: 0.6 oz    Types: 1 Shots of liquor per week     Comment: 10-12/ week-- (2-4 drinks/ day)  . Drug use: No  . Sexual activity: Yes  Lifestyle  . Physical activity:    Days per week: Not on file    Minutes per session: Not on file  . Stress: Not on file  Relationships  . Social connections:    Talks on phone: Not on file    Gets together: Not on file    Attends religious service: Not on file    Active member of club or organization: Not on file    Attends meetings of clubs or organizations: Not on file    Relationship status: Not on file  Other Topics Concern  . Not on file  Social History Narrative  . Not on file     Family History:  The patient's family history includes Diabetes in his brother and father.      ROS:   Please see the history of present illness.    ROS All other systems reviewed and are negative.   PHYSICAL EXAM:   VS:  BP (!) 152/82   Pulse 99   Ht 5\' 8"  (1.727 m)   Wt 170 lb 12 oz (77.5 kg)   SpO2 98%   BMI 25.96 kg/m    GEN: Well nourished, well developed, in no acute distress  HEENT: normal  Neck: no JVD, carotid bruits, or masses Cardiac: RRR; no murmurs, rubs, or gallops,no edema  Respiratory:  clear to auscultation bilaterally, normal work of breathing GI: soft, nontender, nondistended, + BS MS: no deformity or atrophy  Skin: warm and dry, no rash Neuro:  Alert and Oriented x 3, Strength and sensation are intact Psych: euthymic mood, full affect    Wt Readings from Last 3 Encounters:  08/05/17 170 lb 12 oz (77.5 kg)  02/04/17 175 lb (79.4 kg)  11/27/16 169 lb 12.8 oz (77 kg)      Studies/Labs Reviewed:   EKG:  EKG is ordered today.  The ekg ordered today demonstrates NSR HR 83  Recent Labs: 08/25/2016: Magnesium 2.0; TSH 1.620 11/27/2016: ALT 77; BUN 9; Creatinine, Ser 0.78; Potassium 4.5; Sodium 139   Lipid Panel    Component Value Date/Time   CHOL 213 (H) 11/27/2016 1432   TRIG 796 (HH) 11/27/2016 1432   HDL 42 11/27/2016 1432   CHOLHDL 5.1 (H) 11/27/2016 1432   CHOLHDL  3.3 07/20/2016 0427   VLDL 45 (H) 07/20/2016 0427   LDLCALC Comment 11/27/2016 1432    Additional studies/ records that were reviewed today include:  2D ECHO 08/02/16  Study Conclusions - Left  ventricle: The cavity size was normal. Systolic function was normal. The estimated ejection fraction was in the range of 55% to 60%. There is hypokinesis of the basalinferior myocardium. Doppler parameters are consistent with abnormal left ventricular relaxation (grade 1 diastolic dysfunction). - Atrial septum: An Amplatzer closure device was present. 25mm, intact in proper position. No Doppler shunt signal.   2D ECHO 02/04/17 Study Conclusions - Left ventricle: The cavity size was normal. Wall thickness was increased in a pattern of mild LVH. The estimated ejection fraction was 55%. Although no diagnostic regional wall motion abnormality was identified, this possibility cannot be completely excluded on the basis of this study. Doppler parameters are consistent with abnormal left ventricular relaxation (grade 1 diastolic dysfunction). - Right ventricle: The cavity size was normal. Systolic function was normal. - Atrial septum: S/p PFO closure with well-seated closure device. Negative bubble study. - Systemic veins: IVC not visualized. Impressions: - Limited echo to assess PFO closure device.    ASSESSMENT & PLAN:   PFO s/p closure: echo with bubble study 01/2017 showed a well seated Amplatzer device with negative bubble study. No longer on ASA or plavix. I have asked him to resume a baby asprin. He can follow up with Dr. Excell Seltzer and myself PRN.  HTN: BP is moderately elevated today. He watches it closely at home and it runs normal. This is followed by Dr. Neva Seat.   Hx of CVA: i have asked him to resume a baby aspirin. He does not tolerate statins.    Medication Adjustments/Labs and Tests Ordered: Current medicines are reviewed at length with the patient  today.  Concerns regarding medicines are outlined above.  Medication changes, Labs and Tests ordered today are listed in the Patient Instructions below. Patient Instructions  Medication Instructions:  1) START ASPIRIN 81 mg daily  Labwork: None  Testing/Procedures: None  Follow-Up: Your provider recommends that you schedule a follow-up appointment AS NEEDED with the Structural Heart Team!  Any Other Special Instructions Will Be Listed Below (If Applicable).     If you need a refill on your cardiac medications before your next appointment, please call your pharmacy.      Signed, Cline Crock, PA-C  08/05/2017 2:03 PM    [facility] [address] [place], [place], [place]  [address] Phone: [phone]; Fax: [phone]

## 2017-08-05 ENCOUNTER — Encounter: Payer: 59 | Admitting: *Deleted

## 2017-08-05 ENCOUNTER — Ambulatory Visit: Payer: 59 | Admitting: Physician Assistant

## 2017-08-05 ENCOUNTER — Encounter: Payer: Self-pay | Admitting: Physician Assistant

## 2017-08-05 VITALS — BP 152/82 | HR 99 | Ht 68.0 in | Wt 170.8 lb

## 2017-08-05 DIAGNOSIS — I10 Essential (primary) hypertension: Secondary | ICD-10-CM

## 2017-08-05 DIAGNOSIS — Q211 Atrial septal defect: Secondary | ICD-10-CM

## 2017-08-05 DIAGNOSIS — Q2112 Patent foramen ovale: Secondary | ICD-10-CM

## 2017-08-05 DIAGNOSIS — Z006 Encounter for examination for normal comparison and control in clinical research program: Secondary | ICD-10-CM

## 2017-08-05 DIAGNOSIS — Z8673 Personal history of transient ischemic attack (TIA), and cerebral infarction without residual deficits: Secondary | ICD-10-CM

## 2017-08-05 MED ORDER — ASPIRIN EC 81 MG PO TBEC: 81.0000 mg | DELAYED_RELEASE_TABLET | Freq: Every day | ORAL | 3 refills | Status: AC

## 2017-08-05 NOTE — Patient Instructions (Signed)
Medication Instructions:  1) START ASPIRIN 81 mg daily  Labwork: None  Testing/Procedures: None  Follow-Up: Your provider recommends that you schedule a follow-up appointment AS NEEDED with the Structural Heart Team!  Any Other Special Instructions Will Be Listed Below (If Applicable).     If you need a refill on your cardiac medications before your next appointment, please call your pharmacy.

## 2017-10-13 ENCOUNTER — Ambulatory Visit: Payer: 59 | Admitting: Family Medicine

## 2017-10-13 ENCOUNTER — Encounter: Payer: Self-pay | Admitting: Family Medicine

## 2017-10-13 ENCOUNTER — Other Ambulatory Visit: Payer: Self-pay

## 2017-10-13 VITALS — BP 160/98 | HR 98 | Temp 98.7°F | Ht 67.0 in | Wt 173.6 lb

## 2017-10-13 DIAGNOSIS — E781 Pure hyperglyceridemia: Secondary | ICD-10-CM | POA: Diagnosis not present

## 2017-10-13 DIAGNOSIS — I10 Essential (primary) hypertension: Secondary | ICD-10-CM | POA: Diagnosis not present

## 2017-10-13 DIAGNOSIS — S81802A Unspecified open wound, left lower leg, initial encounter: Secondary | ICD-10-CM | POA: Diagnosis not present

## 2017-10-13 DIAGNOSIS — L259 Unspecified contact dermatitis, unspecified cause: Secondary | ICD-10-CM | POA: Diagnosis not present

## 2017-10-13 MED ORDER — MUPIROCIN 2 % EX OINT: 1.0000 "application " | TOPICAL_OINTMENT | Freq: Two times a day (BID) | CUTANEOUS | 0 refills | Status: DC

## 2017-10-13 MED ORDER — TRIAMCINOLONE ACETONIDE 0.5 % EX OINT
1.0000 | TOPICAL_OINTMENT | Freq: Three times a day (TID) | CUTANEOUS | 0 refills | Status: DC | PRN
Start: 2017-10-13 — End: 2018-02-04

## 2017-10-13 NOTE — Patient Instructions (Addendum)
Rash on the leg appears to be possible contact dermatitis, either from the bandage or possibly from Neosporin or other treatments.  Stop use of peroxide, Neosporin, and avoid any bandage with adhesive around those wounds.  For now apply the triamcinolone cream to help with itching up to 2-3 times per day, use the mupirocin ointment to the center of the remaining wound.  Recheck in the next 1 week if those areas are not improving, sooner if worse.  Follow-up with me in the next 2 to 3 weeks to recheck blood pressure and for fasting blood work. If running over 160 on upper number, or 100 on lower number, return sooner.    Keep a record of your blood pressures outside of the office and bring them to the next office visit.Thank you for coming in today.   Return to the clinic or go to the nearest emergency room if any of your symptoms worsen or new symptoms occur.    IF you received an x-ray today, you will receive an invoice from [facility]. Please contact [facility] at [phone] with questions or concerns regarding your invoice.   IF you received labwork today, you will receive an invoice from [facility]. Please contact [facility] at [phone] with questions or concerns regarding your invoice.   Our billing staff will not be able to assist you with questions regarding bills from these companies.  You will be contacted with the lab results as soon as they are available. The fastest way to get your results is to activate your My Chart account. Instructions are located on the last page of this paperwork. If you have not heard from us regarding the results in 2 weeks, please contact this office.    ]

## 2017-10-13 NOTE — Progress Notes (Signed)
Subjective:  By signing my name below, I, Greer Ee, attest that this documentation has been prepared under the direction and in the presence of Shade Flood, MD Electronically Signed: Greer Ee Medical Scribe 10/13/2017   Patient ID: Zachary Woods, male    DOB: 03-26-1965, 52 y.o.   MRN: [id number] Chief Complaint  Patient presents with  . Rash    left leg 2 spots of redness. goingon a few weeks. one of the spots was a masiqito bite    HPI Zachary Woods is a 52 y.o. male who presents to Primary Care at [facility] complaining of rash on left leg. Patient reports that it started a couple of weeks ago, approximately 3 weeks. He had believed it was a cut, it seemed to get better but then got a mosquito bite in a new area which he reports getting agitated. He has used neosporin and peroxide with a Band-Aid at both sites.Marland Kitchen He felt that when he did use band aid it felt worse. Patient reports that the red bumps surrounding the wound have been there before using band aides. Patient reports that he had a previous tick in the groin about 3 weeks ago, but no rash. He reports that he has been [place] to the [place] in [place] this past week.   He has been compliant with his antihypertensives.  Denies any new headache, chest pain, dyspnea, weakness.  Patient Active Problem List   Diagnosis Date Noted  . PFO (patent foramen ovale)   . Cerebral embolism with cerebral infarction 07/20/2016  . Stroke determined by clinical assessment (HCC) 07/20/2016  . Hypertension 07/19/2016  . GERD (gastroesophageal reflux disease) 07/19/2016  . Malignant hypertension 07/19/2016   Past Medical History:  Diagnosis Date  . Acid reflux   . CVA (cerebral vascular accident) (HCC)   . Hypertension    Past Surgical History:  Procedure Laterality Date  . NO PAST SURGERIES    . PATENT FORAMEN OVALE(PFO) CLOSURE N/A 08/01/2016   Procedure: Patent Forament Ovale(PFO) Closure;  Surgeon: Tonny Bollman, MD;  Location: [facility]  INVASIVE CV LAB;  Service: Cardiovascular;  Laterality: N/A;  . TEE WITHOUT CARDIOVERSION N/A 07/21/2016   Procedure: TRANSESOPHAGEAL ECHOCARDIOGRAM (TEE);  Surgeon: Lars Masson, MD;  Location: [facility] ENDOSCOPY;  Service: Cardiovascular;  Laterality: N/A;   Allergies  Allergen Reactions  . Penicillins Itching    Has patient had a PCN reaction causing immediate rash, facial/tongue/throat swelling, SOB or lightheadedness with hypotension: No Has patient had a PCN reaction causing severe rash involving mucus membranes or skin necrosis: No Has patient had a PCN reaction that required hospitalization: No Has patient had a PCN reaction occurring within the last 10 years: Yes If all of the above answers are "NO", then may proceed with Cephalosporin use.   Prior to Admission medications   Medication Sig Start Date End Date Taking? Authorizing Provider  amLODipine (NORVASC) 5 MG tablet Take 1 tablet (5 mg total) by mouth daily. OV NEEDED 06/29/17   Shade Flood, MD  aspirin EC 81 MG tablet Take 1 tablet (81 mg total) by mouth daily. 08/05/17   Janetta Hora, PA-C  Multiple Vitamin (MULTIVITAMIN WITH MINERALS) TABS tablet Take 1 tablet by mouth daily.    [provider]  pantoprazole (PROTONIX) 40 MG tablet TAKE 1 TABLET BY MOUTH EVERY DAY 03/25/17   Tonny Bollman, MD  sildenafil (VIAGRA) 100 MG tablet Take 0.5-1 tablets (50-100 mg total) by mouth daily as needed for erectile dysfunction.  11/27/16   Shade FloodGreene, Rollyn Scialdone R, MD   Social History   Socioeconomic History  . Marital status: Single    Spouse name: Not on file  . Number of children: Not on file  . Years of education: Not on file  . Highest education level: Not on file  Occupational History  . Occupation: Curatormechanic  Social Needs  . Financial resource strain: Not on file  . Food insecurity:    Worry: Not on file    Inability: Not on file  . Transportation needs:    Medical: Not on file    Non-medical: Not on file    Tobacco Use  . Smoking status: Former Games developermoker  . Smokeless tobacco: Never Used  Substance and Sexual Activity  . Alcohol use: Yes    Alcohol/week: 1.0 standard drinks    Types: 1 Shots of liquor per week    Comment: 10-12/ week-- (2-4 drinks/ day)  . Drug use: No  . Sexual activity: Yes  Lifestyle  . Physical activity:    Days per week: Not on file    Minutes per session: Not on file  . Stress: Not on file  Relationships  . Social connections:    Talks on phone: Not on file    Gets together: Not on file    Attends religious service: Not on file    Active member of club or organization: Not on file    Attends meetings of clubs or organizations: Not on file    Relationship status: Not on file  . Intimate partner violence:    Fear of current or ex partner: Not on file    Emotionally abused: Not on file    Physically abused: Not on file    Forced sexual activity: Not on file  Other Topics Concern  . Not on file  Social History Narrative  . Not on file    Review of Systems Per HPI.     Objective:   Physical Exam  Constitutional: He is oriented to person, place, and time. He appears well-developed and well-nourished. No distress.  HENT:  Head: Normocephalic and atraumatic.  Cardiovascular: Normal rate.  Pulmonary/Chest: Effort normal.  Musculoskeletal:       Legs: There is an approximately  5 mm lesion that appears to be healing with 5-6 surrounding patch of faint erythema, small red papules. Second lesion is 8 mm shallow ulcer, without significant induration, dry and no discharge 4 x 5 cm, faint erythema and small red papules. No vascular streaking or surrounding erythema.   Neurological: He is alert and oriented to person, place, and time.  Psychiatric: He has a normal mood and affect.   Vitals:   10/13/17 1704 10/13/17 1707  BP: (!) 161/96 (!) 160/98  Pulse: 98   Temp: 98.7 F (37.1 C)   TempSrc: Oral   SpO2: 91%   Weight: 173 lb 9.6 oz (78.7 kg)   Height: 5'  7" (1.702 m)        Assessment & Plan:   Zachary HailJames S Kilburg is a 52 y.o. male Contact dermatitis, unspecified contact dermatitis type, unspecified trigger - Plan: triamcinolone ointment (KENALOG) 0.5  Wound of left lower extremity, initial encounter - Plan: mupirocin ointment (BACTROBAN) 2 %  -Possible contact dermatitis from bandage versus Neosporin.  Stop use of Neosporin, start Bactroban, triamcinolone to irritated/itching areas and RTC precautions given.    Essential hypertension - Plan: Comprehensive metabolic panel, Lipid panel Hypertriglyceridemia - Plan: Lipid panel  -Elevated BP in office,  check labs at fasting lab visit.  Monitor home readings and bring those to next visit to determine med changes.  RTC precautions given including if any home readings over 160 systolic or 100 diastolic or new symptoms.  Understanding expressed.   Meds ordered this encounter  Medications  . triamcinolone ointment (KENALOG) 0.5 %    Sig: Apply 1 application topically 3 (three) times daily as needed.    Dispense:  30 g    Refill:  0  . mupirocin ointment (BACTROBAN) 2 %    Sig: Apply 1 application topically 2 (two) times daily. For 1 week to open wound.    Dispense:  22 g    Refill:  0   Patient Instructions   Rash on the leg appears to be possible contact dermatitis, either from the bandage or possibly from Neosporin or other treatments.  Stop use of peroxide, Neosporin, and avoid any bandage with adhesive around those wounds.  For now apply the triamcinolone cream to help with itching up to 2-3 times per day, use the mupirocin ointment to the center of the remaining wound.  Recheck in the next 1 week if those areas are not improving, sooner if worse.  Follow-up with me in the next 2 to 3 weeks to recheck blood pressure and for fasting blood work. If running over 160 on upper number, or 100 on lower number, return sooner.    Keep a record of your blood pressures outside of the office and bring them  to the next office visit.Thank you for coming in today.   Return to the clinic or go to the nearest emergency room if any of your symptoms worsen or new symptoms occur.    IF you received an x-ray today, you will receive an invoice from [facility]. Please contact [facility] at [phone] with questions or concerns regarding your invoice.   IF you received labwork today, you will receive an invoice from [facility]. Please contact [facility] at [phone] with questions or concerns regarding your invoice.   Our billing staff will not be able to assist you with questions regarding bills from these companies.  You will be contacted with the lab results as soon as they are available. The fastest way to get your results is to activate your My Chart account. Instructions are located on the last page of this paperwork. If you have not heard from us regarding the results in 2 weeks, please contact this office.    ]   I personally performed the services described in this documentation, which was scribed in my presence. The recorded information has been reviewed and considered for accuracy and completeness, addended by me as needed, and agree with information above.  Signed,   Meredith StaggersJeffrey Normajean Nash, MD Primary Care at [facility].  10/18/17 4:27 PM

## 2017-10-18 ENCOUNTER — Encounter: Payer: Self-pay | Admitting: Family Medicine

## 2017-10-27 ENCOUNTER — Encounter: Payer: Self-pay | Admitting: Family Medicine

## 2017-10-27 ENCOUNTER — Ambulatory Visit (INDEPENDENT_AMBULATORY_CARE_PROVIDER_SITE_OTHER): Payer: 59 | Admitting: Family Medicine

## 2017-10-27 ENCOUNTER — Other Ambulatory Visit: Payer: Self-pay

## 2017-10-27 DIAGNOSIS — R7989 Other specified abnormal findings of blood chemistry: Secondary | ICD-10-CM

## 2017-10-27 DIAGNOSIS — R739 Hyperglycemia, unspecified: Secondary | ICD-10-CM

## 2017-10-27 DIAGNOSIS — R945 Abnormal results of liver function studies: Secondary | ICD-10-CM

## 2017-10-27 DIAGNOSIS — E781 Pure hyperglyceridemia: Secondary | ICD-10-CM

## 2017-10-27 DIAGNOSIS — I10 Essential (primary) hypertension: Secondary | ICD-10-CM

## 2017-10-27 MED ORDER — AMLODIPINE BESYLATE 10 MG PO TABS: 10.0000 mg | ORAL_TABLET | Freq: Every day | ORAL | 1 refills | Status: DC

## 2017-10-27 NOTE — Patient Instructions (Addendum)
Increase amlodipine to 10 mg each day.  Recheck in the next 3 months and can perform a physical at that time.  I will check some of the lab tests that were drawn this morning and let you know if there are any changes.    If you notice significant increase in ankle or lower leg swelling, that may be related to higher dose of blood pressure medicine, and we can look at other options.  Thank you for coming in today  If you have lab work done today you will be contacted with your lab results within the next 2 weeks.  If you have not heard from us then please contact us. The fastest way to get your results is to register for My Chart.   IF you received an x-ray today, you will receive an invoice from [facility]. Please contact [facility] at [phone] with questions or concerns regarding your invoice.   IF you received labwork today, you will receive an invoice from [facility]. Please contact [facility] at [phone] with questions or concerns regarding your invoice.   Our billing staff will not be able to assist you with questions regarding bills from these companies.  You will be contacted with the lab results as soon as they are available. The fastest way to get your results is to activate your My Chart account. Instructions are located on the last page of this paperwork. If you have not heard from us regarding the results in 2 weeks, please contact this office.

## 2017-10-27 NOTE — Progress Notes (Signed)
Subjective:    Patient ID: Zachary Woods, male    DOB: [birth year], 52 y.o.   MRN: [id number]  HPI Zachary Woods is a 52 y.o. male Presents today for: Chief Complaint  Patient presents with  . Blood Pressure Check    2 week f/u    Here for follow-up of hypertension.  He was last seen October 13, 2017.  Seen primarily for suspected contact dermatitis at that time. Rash has improved. Areas are healing. Had been taking Norvasc 5 mg daily without reported missed doses.  Recommended home monitoring and fasting labs ordered this am.   Has not been checking home much, but had some 170/80s. No leg swelling in past typically, but rarely if on feet all day.    BP Readings from Last 3 Encounters:  10/27/17 (!) 142/92  10/13/17 (!) 160/98  08/05/17 (!) 152/82     Patient Active Problem List   Diagnosis Date Noted  . PFO (patent foramen ovale)   . Cerebral embolism with cerebral infarction 07/20/2016  . Stroke determined by clinical assessment (HCC) 07/20/2016  . Hypertension 07/19/2016  . GERD (gastroesophageal reflux disease) 07/19/2016  . Malignant hypertension 07/19/2016   Past Medical History:  Diagnosis Date  . Acid reflux   . CVA (cerebral vascular accident) (HCC)   . Hypertension    Past Surgical History:  Procedure Laterality Date  . NO PAST SURGERIES    . PATENT FORAMEN OVALE(PFO) CLOSURE N/A 08/01/2016   Procedure: Patent Forament Ovale(PFO) Closure;  Surgeon: Tonny Bollman, MD;  Location: [facility] INVASIVE CV LAB;  Service: Cardiovascular;  Laterality: N/A;  . TEE WITHOUT CARDIOVERSION N/A 07/21/2016   Procedure: TRANSESOPHAGEAL ECHOCARDIOGRAM (TEE);  Surgeon: Lars Masson, MD;  Location: [facility] ENDOSCOPY;  Service: Cardiovascular;  Laterality: N/A;   Allergies  Allergen Reactions  . Penicillins Itching    Has patient had a PCN reaction causing immediate rash, facial/tongue/throat swelling, SOB or lightheadedness with hypotension: No Has patient had a PCN reaction  causing severe rash involving mucus membranes or skin necrosis: No Has patient had a PCN reaction that required hospitalization: No Has patient had a PCN reaction occurring within the last 10 years: Yes If all of the above answers are "NO", then may proceed with Cephalosporin use.   Prior to Admission medications   Medication Sig Start Date End Date Taking? Authorizing Provider  amLODipine (NORVASC) 5 MG tablet Take 1 tablet (5 mg total) by mouth daily. OV NEEDED 06/29/17  Yes Shade Flood, MD  aspirin EC 81 MG tablet Take 1 tablet (81 mg total) by mouth daily. 08/05/17  Yes Janetta Hora, PA-C  Multiple Vitamin (MULTIVITAMIN WITH MINERALS) TABS tablet Take 1 tablet by mouth daily.   Yes [provider]  mupirocin ointment (BACTROBAN) 2 % Apply 1 application topically 2 (two) times daily. For 1 week to open wound. 10/13/17  Yes Shade Flood, MD  pantoprazole (PROTONIX) 40 MG tablet TAKE 1 TABLET BY MOUTH EVERY DAY 03/25/17  Yes Tonny Bollman, MD  sildenafil (VIAGRA) 100 MG tablet Take 0.5-1 tablets (50-100 mg total) by mouth daily as needed for erectile dysfunction. 11/27/16  Yes Shade Flood, MD  triamcinolone ointment (KENALOG) 0.5 % Apply 1 application topically 3 (three) times daily as needed. 10/13/17  Yes Shade Flood, MD   Social History   Socioeconomic History  . Marital status: Single    Spouse name: Not on file  . Number of children: Not on file  .  Years of education: Not on file  . Highest education level: Not on file  Occupational History  . Occupation: Curator  Social Needs  . Financial resource strain: Not on file  . Food insecurity:    Worry: Not on file    Inability: Not on file  . Transportation needs:    Medical: Not on file    Non-medical: Not on file  Tobacco Use  . Smoking status: Former Games developer  . Smokeless tobacco: Never Used  Substance and Sexual Activity  . Alcohol use: Yes    Alcohol/week: 1.0 standard drinks    Types: 1  Shots of liquor per week    Comment: 10-12/ week-- (2-4 drinks/ day)  . Drug use: No  . Sexual activity: Yes  Lifestyle  . Physical activity:    Days per week: Not on file    Minutes per session: Not on file  . Stress: Not on file  Relationships  . Social connections:    Talks on phone: Not on file    Gets together: Not on file    Attends religious service: Not on file    Active member of club or organization: Not on file    Attends meetings of clubs or organizations: Not on file    Relationship status: Not on file  . Intimate partner violence:    Fear of current or ex partner: Not on file    Emotionally abused: Not on file    Physically abused: Not on file    Forced sexual activity: Not on file  Other Topics Concern  . Not on file  Social History Narrative  . Not on file    Review of Systems  Constitutional: Negative for fatigue and unexpected weight change.  Eyes: Negative for visual disturbance.  Respiratory: Negative for cough, chest tightness and shortness of breath.   Cardiovascular: Negative for chest pain, palpitations and leg swelling.  Gastrointestinal: Negative for abdominal pain and blood in stool.  Neurological: Negative for dizziness, light-headedness and headaches.       Objective:   Physical Exam  Constitutional: He is oriented to person, place, and time. He appears well-developed and well-nourished.  HENT:  Head: Normocephalic and atraumatic.  Eyes: Pupils are equal, round, and reactive to light. EOM are normal.  Neck: No JVD present. Carotid bruit is not present.  Cardiovascular: Normal rate, regular rhythm and normal heart sounds.  No murmur heard. Pulmonary/Chest: Effort normal and breath sounds normal. He has no rales.  Musculoskeletal: He exhibits no edema.  Neurological: He is alert and oriented to person, place, and time.  Skin: Skin is warm and dry.  Psychiatric: He has a normal mood and affect.  Vitals reviewed.  Vitals:   10/27/17 1449  10/27/17 1451  BP: (!) 172/100 (!) 142/92  Pulse: 89   Temp: 99 F (37.2 C)   TempSrc: Oral   SpO2: 98%   Weight: 170 lb (77.1 kg)   Height: 5\' 7"  (1.702 m)        Assessment & Plan:  Zachary Woods is a 52 y.o. male Essential hypertension - Plan: Lipid panel, Comprehensive metabolic panel  - still elevated readings. Increase amlodipine to 10 mg risks/side effects discussed including possible peripheral edema.  RTC precautions.   Recheck 3 months for physical at that time.  Hypertriglyceridemia - Plan: Lipid panel, CANCELED: Lipid panel Elevated liver function tests - Plan: CANCELED: Hepatic Function Panel Hyperglycemia - Plan: Hemoglobin A1c  -Fasting labs obtained for above.  Further  recommendations depending on results  No orders of the defined types were placed in this encounter.  Patient Instructions   Increase amlodipine to 10 mg each day.  Recheck in the next 3 months and can perform a physical at that time.  I will check some of the lab tests that were drawn this morning and let you know if there are any changes.    If you notice significant increase in ankle or lower leg swelling, that may be related to higher dose of blood pressure medicine, and we can look at other options.  Thank you for coming in today  If you have lab work done today you will be contacted with your lab results within the next 2 weeks.  If you have not heard from us then please contact us. The fastest way to get your results is to register for My Chart.   IF you received an x-ray today, you will receive an invoice from [facility]. Please contact [facility] at [phone] with questions or concerns regarding your invoice.   IF you received labwork today, you will receive an invoice from [facility]. Please contact [facility] at [phone] with questions or concerns regarding your invoice.   Our billing staff will not be able to assist you with questions regarding bills from these  companies.  You will be contacted with the lab results as soon as they are available. The fastest way to get your results is to activate your My Chart account. Instructions are located on the last page of this paperwork. If you have not heard from us regarding the results in 2 weeks, please contact this office.      Signed,   Meredith StaggersJeffrey Jaice Lague, MD Primary Care at [facility].  10/28/17 10:32 PM

## 2017-10-28 ENCOUNTER — Encounter: Payer: Self-pay | Admitting: Family Medicine

## 2017-10-28 LAB — COMPREHENSIVE METABOLIC PANEL
ALK PHOS: 58 IU/L (ref 39–117)
ALT: 70 IU/L — AB (ref 0–44)
AST: 98 IU/L — AB (ref 0–40)
Albumin/Globulin Ratio: 2.1 (ref 1.2–2.2)
Albumin: 4.5 g/dL (ref 3.5–5.5)
BILIRUBIN TOTAL: 0.3 mg/dL (ref 0.0–1.2)
BUN/Creatinine Ratio: 11 (ref 9–20)
BUN: 8 mg/dL (ref 6–24)
CHLORIDE: 101 mmol/L (ref 96–106)
CO2: 22 mmol/L (ref 20–29)
Calcium: 9.2 mg/dL (ref 8.7–10.2)
Creatinine, Ser: 0.74 mg/dL — ABNORMAL LOW (ref 0.76–1.27)
GFR calc Af Amer: 123 mL/min/{1.73_m2} (ref >59)
GFR calc non Af Amer: 107 mL/min/{1.73_m2} (ref >59)
GLUCOSE: 136 mg/dL — AB (ref 65–99)
Globulin, Total: 2.1 g/dL (ref 1.5–4.5)
POTASSIUM: 3.8 mmol/L (ref 3.5–5.2)
Sodium: 143 mmol/L (ref 134–144)
TOTAL PROTEIN: 6.6 g/dL (ref 6.0–8.5)

## 2017-10-28 LAB — HEMOGLOBIN A1C
Est. average glucose Bld gHb Est-mCnc: 128 mg/dL
HEMOGLOBIN A1C: 6.1 % — AB (ref 4.8–5.6)

## 2017-10-28 LAB — LIPID PANEL
CHOL/HDL RATIO: 3.9 ratio (ref 0.0–5.0)
CHOLESTEROL TOTAL: 166 mg/dL (ref 100–199)
HDL: 43 mg/dL (ref >39)
TRIGLYCERIDES: 739 mg/dL — AB (ref 0–149)

## 2017-10-28 LAB — HEPATIC FUNCTION PANEL: BILIRUBIN, DIRECT: 0.14 mg/dL (ref 0.00–0.40)

## 2018-02-04 ENCOUNTER — Ambulatory Visit (INDEPENDENT_AMBULATORY_CARE_PROVIDER_SITE_OTHER): Payer: 59 | Admitting: Family Medicine

## 2018-02-04 ENCOUNTER — Encounter: Payer: Self-pay | Admitting: Family Medicine

## 2018-02-04 VITALS — BP 150/90 | HR 102 | Temp 98.6°F | Ht 67.0 in | Wt 176.2 lb

## 2018-02-04 DIAGNOSIS — R739 Hyperglycemia, unspecified: Secondary | ICD-10-CM

## 2018-02-04 DIAGNOSIS — R7303 Prediabetes: Secondary | ICD-10-CM | POA: Diagnosis not present

## 2018-02-04 DIAGNOSIS — Z23 Encounter for immunization: Secondary | ICD-10-CM

## 2018-02-04 DIAGNOSIS — I10 Essential (primary) hypertension: Secondary | ICD-10-CM | POA: Diagnosis not present

## 2018-02-04 DIAGNOSIS — Z0001 Encounter for general adult medical examination with abnormal findings: Secondary | ICD-10-CM | POA: Diagnosis not present

## 2018-02-04 DIAGNOSIS — Z1211 Encounter for screening for malignant neoplasm of colon: Secondary | ICD-10-CM

## 2018-02-04 DIAGNOSIS — E781 Pure hyperglyceridemia: Secondary | ICD-10-CM

## 2018-02-04 DIAGNOSIS — E785 Hyperlipidemia, unspecified: Secondary | ICD-10-CM

## 2018-02-04 DIAGNOSIS — Z8673 Personal history of transient ischemic attack (TIA), and cerebral infarction without residual deficits: Secondary | ICD-10-CM

## 2018-02-04 DIAGNOSIS — R3989 Other symptoms and signs involving the genitourinary system: Secondary | ICD-10-CM

## 2018-02-04 DIAGNOSIS — Z0000 Encounter for general adult medical examination without abnormal findings: Secondary | ICD-10-CM

## 2018-02-04 DIAGNOSIS — Z125 Encounter for screening for malignant neoplasm of prostate: Secondary | ICD-10-CM

## 2018-02-04 MED ORDER — AMLODIPINE BESYLATE 10 MG PO TABS: 10.0000 mg | ORAL_TABLET | Freq: Every day | ORAL | 1 refills | Status: AC

## 2018-02-04 MED ORDER — HYDROCHLOROTHIAZIDE 12.5 MG PO CAPS: 12.5000 mg | ORAL_CAPSULE | Freq: Every day | ORAL | 1 refills | Status: DC

## 2018-02-04 NOTE — Patient Instructions (Addendum)
Cologuard was ordered for colon cancer screening.  Let me know if you do not receive that kit in the next month.  I also referred you back to urology, but if you do not hear from them in the next 3 weeks let me know.  Blood pressure elevated. Add hctz once per day. Recheck in next 2 months.    I will check blood sugar test, triglyceride test and other blood work from today and let she know if any other recommendations.  Continue to watch diet and exercise to manage blood sugar.  Let me know if there are questions.   Keeping you healthy  Get these tests  Blood pressure- Have your blood pressure checked once a year by your healthcare provider.  Normal blood pressure is 120/80  Weight- Have your body mass index (BMI) calculated to screen for obesity.  BMI is a measure of body fat based on height and weight. You can also calculate your own BMI at ViewBanking.si.  Cholesterol- Have your cholesterol checked every year.  Diabetes- Have your blood sugar checked regularly if you have high blood pressure, high cholesterol, have a family history of diabetes or if you are overweight.  Screening for Colon Cancer- Colonoscopy starting at age 46.  Screening may begin sooner depending on your family history and other health conditions. Follow up colonoscopy as directed by your Gastroenterologist.  Screening for Prostate Cancer- Both blood work (PSA) and a rectal exam help screen for Prostate Cancer.  Screening begins at age 61 with African-American men and at age 91 with Caucasian men.  Screening may begin sooner depending on your family history.  Take these medicines  Aspirin- One aspirin daily can help prevent Heart disease and Stroke.  Flu shot- Every fall.  Tetanus- Every 10 years.  Zostavax- Once after the age of 108 to prevent Shingles.  Pneumonia shot- Once after the age of 58; if you are younger than 26, ask your healthcare provider if you need a Pneumonia shot.  Take these  steps  Don't smoke- If you do smoke, talk to your doctor about quitting.  For tips on how to quit, go to www.smokefree.gov or call 1-800-QUIT-NOW.  Be physically active- Exercise 5 days a week for at least 30 minutes.  If you are not already physically active start slow and gradually work up to 30 minutes of moderate physical activity.  Examples of moderate activity include walking briskly, mowing the yard, dancing, swimming, bicycling, etc.  Eat a healthy diet- Eat a variety of healthy food such as fruits, vegetables, low fat milk, low fat cheese, yogurt, lean meant, poultry, fish, beans, tofu, etc. For more information go to www.thenutritionsource.org  Drink alcohol in moderation- Limit alcohol intake to less than two drinks a day. Never drink and drive.  Dentist- Brush and floss twice daily; visit your dentist twice a year.  Depression- Your emotional health is as important as your physical health. If you're feeling down, or losing interest in things you would normally enjoy please talk to your healthcare provider.  Eye exam- Visit your eye doctor every year.  Safe sex- If you may be exposed to a sexually transmitted infection, use a condom.  Seat belts- Seat belts can save your life; always wear one.  Smoke/Carbon Monoxide detectors- These detectors need to be installed on the appropriate level of your home.  Replace batteries at least once a year.  Skin cancer- When out in the sun, cover up and use sunscreen 15 SPF or higher.  Violence- If anyone is threatening you, please tell your healthcare provider.  Living Will/ Health care power of attorney- Speak with your healthcare provider and family.   If you have lab work done today you will be contacted with your lab results within the next 2 weeks.  If you have not heard from [place] then please contact us. The fastest way to get your results is to register for My Chart.   IF you received an x-ray today, you will receive an invoice from  [facility]. Please contact [facility] at [phone] with questions or concerns regarding your invoice.   IF you received labwork today, you will receive an invoice from [facility]. Please contact [facility] at [phone] with questions or concerns regarding your invoice.   Our billing staff will not be able to assist you with questions regarding bills from these companies.  You will be contacted with the lab results as soon as they are available. The fastest way to get your results is to activate your My Chart account. Instructions are located on the last page of this paperwork. If you have not heard from [place] regarding the results in 2 weeks, please contact this office.

## 2018-02-04 NOTE — Progress Notes (Signed)
Subjective:    Patient ID: Zachary Woods, male    DOB: 09-21-1965, 52 y.o.   MRN: [id number]  HPI Zachary Woods is a 52 y.o. male Presents today for: Chief Complaint  Patient presents with  . Annual Exam    CPE (fasting)    Hypertension: BP Readings from Last 3 Encounters:  02/04/18 (!) 150/90  10/27/17 (!) 142/92  10/13/17 (!) 160/98   Lab Results  Component Value Date   CREATININE 0.74 (L) 10/27/2017  Currently takes Norvasc 10 mg daily, aspirin 81 mg daily.  BP 152/82 at cardiology appointment June, but reported normal readings at home. Home readings on wrist BP monitor - 140-145/75.   History of cerebral embolism with PFO.: Cryptogenic stroke status post PFO closure June 2018.  Stroke in May 2018.  Cardiology follow-up June 5.  Blood pressure 152/82 at that time.  He had stopped statin due to myalgias.  Was not on aspirin or Plavix at that time, now on aspirin daily. No new bleeding or side effects. No new weakness.   Cancer screening: Colon cancer screen - wants to try cologuard. Hx of hemorrhoid, no recent bleeding.  Prostate: PSA normal at 0.6 last year. Possible left sided nodule/irregular. Referred to urology, but did not get a call.  No new urinary sx's/hematuria, night sweats or weight loss. Agrees to PSA but defers DRE to follow up with urology.   Hyperglycemia/prediabetes Lab Results  Component Value Date   HGBA1C 6.1 (H) 10/27/2017   Wt Readings from Last 3 Encounters:  02/04/18 176 lb 3.2 oz (79.9 kg)  10/27/17 170 lb (77.1 kg)  10/13/17 173 lb 9.6 oz (78.7 kg)   Hyperlipidemia:  Lab Results  Component Value Date   CHOL 166 10/27/2017   HDL 43 10/27/2017   LDLCALC Comment 10/27/2017   TRIG 739 (HH) 10/27/2017   CHOLHDL 3.9 10/27/2017   Lab Results  Component Value Date   ALT 70 (H) 10/27/2017   AST 98 (H) 10/27/2017   ALKPHOS 58 10/27/2017   BILITOT 0.3 10/27/2017  fasting today. No hx of pancreatitis. Did not tolerate statins - myalgias.    Immunization History  Administered Date(s) Administered  . Influenza,inj,Quad PF,6+ Mos 11/27/2016, 02/04/2018  . Tdap 11/27/2016   Depression screen: Depression screen [facility] 2/9 02/04/2018 10/27/2017 10/13/2017 11/27/2016 08/25/2016  Decreased Interest 0 0 0 0 0  Down, Depressed, Hopeless 0 0 0 0 0  PHQ - 2 Score 0 0 0 0 0   Vision screen:  Visual Acuity Screening   Right eye Left eye Both eyes  Without correction: '20/70 20/40 20/30 '  With correction:     optho december last year. Plans to see again in first of next year.   Dentist: all dentures. No regular dentist.   Exercise: walking and with physical job at work, outdoor exercise with camping/canoeing. Restoring farmhouse.   Patient Active Problem List   Diagnosis Date Noted  . PFO (patent foramen ovale)   . Cerebral embolism with cerebral infarction 07/20/2016  . Stroke determined by clinical assessment ([place]) 07/20/2016  . Hypertension 07/19/2016  . GERD (gastroesophageal reflux disease) 07/19/2016  . Malignant hypertension 07/19/2016   Past Medical History:  Diagnosis Date  . Acid reflux   . CVA (cerebral vascular accident) ([place])   . Hypertension    Past Surgical History:  Procedure Laterality Date  . NO PAST SURGERIES    . PATENT FORAMEN OVALE(PFO) CLOSURE N/A 08/01/2016   Procedure: Patent [place](PFO) Closure;  Surgeon: Sherren Mocha, MD;  Location: [facility];  Service: Cardiovascular;  Laterality: N/A;  . TEE WITHOUT CARDIOVERSION N/A 07/21/2016   Procedure: TRANSESOPHAGEAL ECHOCARDIOGRAM (TEE);  Surgeon: Dorothy Spark, MD;  Location: [facility];  Service: Cardiovascular;  Laterality: N/A;   Allergies  Allergen Reactions  . Penicillins Itching    Has patient had a PCN reaction causing immediate rash, facial/tongue/throat swelling, SOB or lightheadedness with hypotension: No Has patient had a PCN reaction causing severe rash involving mucus membranes or skin necrosis: No Has patient had a  PCN reaction that required hospitalization: No Has patient had a PCN reaction occurring within the last 10 years: Yes If all of the above answers are "NO", then may proceed with Cephalosporin use.   Prior to Admission medications   Medication Sig Start Date End Date Taking? Authorizing Provider  amLODipine (NORVASC) 10 MG tablet Take 1 tablet (10 mg total) by mouth daily. 10/27/17  Yes Wendie Agreste, MD  aspirin EC 81 MG tablet Take 1 tablet (81 mg total) by mouth daily. 08/05/17  Yes Eileen [facility], PA-C  Multiple Vitamin (MULTIVITAMIN WITH MINERALS) TABS tablet Take 1 tablet by mouth daily.   Yes [provider]  pantoprazole (PROTONIX) 40 MG tablet TAKE 1 TABLET BY MOUTH EVERY DAY 03/25/17  Yes Sherren Mocha, MD  sildenafil (VIAGRA) 100 MG tablet Take 0.5-1 tablets (50-100 mg total) by mouth daily as needed for erectile dysfunction. 11/27/16  Yes Wendie Agreste, MD   Social History   Socioeconomic History  . Marital status: Single    Spouse name: Not on file  . Number of children: Not on file  . Years of education: Not on file  . Highest education level: Not on file  Occupational History  . Occupation: Dealer  Social Needs  . Financial resource strain: Not on file  . Food insecurity:    Worry: Not on file    Inability: Not on file  . Transportation needs:    Medical: Not on file    Non-medical: Not on file  Tobacco Use  . Smoking status: Former Research scientist (life sciences)  . Smokeless tobacco: Never Used  Substance and Sexual Activity  . Alcohol use: Yes    Alcohol/week: 1.0 standard drinks    Types: 1 Shots of liquor per week    Comment: 10-12/ week-- (2-4 drinks/ day)  . Drug use: No  . Sexual activity: Yes  Lifestyle  . Physical activity:    Days per week: Not on file    Minutes per session: Not on file  . Stress: Not on file  Relationships  . Social connections:    Talks on phone: Not on file    Gets together: Not on file    Attends religious service: Not on  file    Active member of club or organization: Not on file    Attends meetings of clubs or organizations: Not on file    Relationship status: Not on file  . Intimate partner violence:    Fear of current or ex partner: Not on file    Emotionally abused: Not on file    Physically abused: Not on file    Forced sexual activity: Not on file  Other Topics Concern  . Not on file  Social History Narrative  . Not on file    Review of Systems  Constitutional: Negative for fatigue and unexpected weight change.  Eyes: Negative for visual disturbance.  Respiratory: Negative for cough, chest tightness and  shortness of breath.   Cardiovascular: Negative for chest pain, palpitations and leg swelling.  Gastrointestinal: Negative for abdominal pain and blood in stool.  Neurological: Negative for dizziness, light-headedness and headaches.   13 point review of systems per patient health survey noted.  Negative other than as indicated above or in HPI.        Objective:   Physical Exam  Constitutional: He is oriented to person, place, and time. He appears well-developed and well-nourished.  HENT:  Head: Normocephalic and atraumatic.  Right Ear: External ear normal.  Left Ear: External ear normal.  Mouth/Throat: Oropharynx is clear and moist.  Eyes: Pupils are equal, round, and reactive to light. Conjunctivae and EOM are normal.  Neck: Normal range of motion. Neck supple. No thyromegaly present.  Cardiovascular: Normal rate, regular rhythm, normal heart sounds and intact distal pulses.  Pulmonary/Chest: Effort normal and breath sounds normal. No respiratory distress. He has no wheezes.  Abdominal: Soft. He exhibits no distension. There is no tenderness.  Musculoskeletal: Normal range of motion. He exhibits no edema or tenderness.  Lymphadenopathy:    He has no cervical adenopathy.  Neurological: He is alert and oriented to person, place, and time. He has normal reflexes.  Skin: Skin is warm and  dry.  Psychiatric: He has a normal mood and affect. His behavior is normal.  Vitals reviewed.  Vitals:   02/04/18 1409 02/04/18 1413  BP: (!) 167/105 (!) 150/90  Pulse: (!) 102   Temp: 98.6 F (37 C)   TempSrc: Oral   SpO2: 98%   Weight: 176 lb 3.2 oz (79.9 kg)   Height: '5\' 7"'  (1.702 m)       Assessment & Plan:   Zachary Woods is a 52 y.o. male Annual physical exam - Plan: Lipid panel, Comprehensive metabolic panel, Hemoglobin A1c  --anticipatory guidance as below in AVS, screening labs above. Health maintenance items as above in HPI discussed/recommended as applicable.   Need for influenza vaccination - Plan: Flu Vaccine QUAD 36+ mos IM  Hyperglycemia, Prediabetes - Plan: Hemoglobin A1c  -Watch diet, exercise, check A1c.  Hyperlipidemia, unspecified hyperlipidemia type - Plan: Lipid panel, Comprehensive metabolic panel Hypertriglyceridemia - Plan: Lipid panel  -Significant hypertriglyceridemia, risks including pancreatitis discussed.  Repeat labs, but consider other agent beside statin for triglyceride lowering  Essential hypertension - Plan: hydrochlorothiazide (MICROZIDE) 12.5 MG capsule, amLODipine (NORVASC) 10 MG tablet  -Uncontrolled, add HCTZ 12.5 mg daily, potential side effects risk discussed.  Continue Norvasc same dose, recheck next 2 months  History of CVA (cerebrovascular accident)  -On aspirin daily, secondary prevention as above.  Screen for colon cancer - Plan: Cologuard  -Potential  limitations and timing intervals discussed, as well as potential need for diagnostic colonoscopy if positive.  Understanding expressed. Cologuard ordered.  Abnormal prostate exam - Plan: Ambulatory referral to Urology Screening for prostate cancer - Plan: PSA  -Reassuring PSA last year, but did have a possible irregularity on left prostate.  Plan for urology follow-up at that time.  We will repeat PSA, and refer back to urology.  Meds ordered this encounter  Medications  .  hydrochlorothiazide (MICROZIDE) 12.5 MG capsule    Sig: Take 1 capsule (12.5 mg total) by mouth daily.    Dispense:  90 capsule    Refill:  1  . amLODipine (NORVASC) 10 MG tablet    Sig: Take 1 tablet (10 mg total) by mouth daily.    Dispense:  90 tablet    Refill:  1   Patient Instructions   Cologuard was ordered for colon cancer screening.  Let me know if you do not receive that kit in the next month.  I also referred you back to urology, but if you do not hear from them in the next 3 weeks let me know.  Blood pressure elevated. Add hctz once per day. Recheck in next 2 months.    I will check blood sugar test, triglyceride test and other blood work from today and let she know if any other recommendations.  Continue to watch diet and exercise to manage blood sugar.  Let me know if there are questions.   Keeping you healthy  Get these tests  Blood pressure- Have your blood pressure checked once a year by your healthcare provider.  Normal blood pressure is 120/80  Weight- Have your body mass index (BMI) calculated to screen for obesity.  BMI is a measure of body fat based on height and weight. You can also calculate your own BMI at ViewBanking.[email or web address].  Cholesterol- Have your cholesterol checked every year.  Diabetes- Have your blood sugar checked regularly if you have high blood pressure, high cholesterol, have a family history of diabetes or if you are overweight.  Screening for Colon Cancer- Colonoscopy starting at age 35.  Screening may begin sooner depending on your family history and other health conditions. Follow up colonoscopy as directed by your Gastroenterologist.  Screening for Prostate Cancer- Both blood work (PSA) and a rectal exam help screen for Prostate Cancer.  Screening begins at age 51 with African-American men and at age 24 with Caucasian men.  Screening may begin sooner depending on your family history.  Take these medicines  Aspirin- One aspirin daily  can help prevent Heart disease and Stroke.  Flu shot- Every fall.  Tetanus- Every 10 years.  Zostavax- Once after the age of 24 to prevent Shingles.  Pneumonia shot- Once after the age of 52; if you are younger than 29, ask your healthcare provider if you need a Pneumonia shot.  Take these steps  Don't smoke- If you do smoke, talk to your doctor about quitting.  For tips on how to quit, go to www.smokefree.gov or call 1-800-QUIT-NOW.  Be physically active- Exercise 5 days a week for at least 30 minutes.  If you are not already physically active start slow and gradually work up to 30 minutes of moderate physical activity.  Examples of moderate activity include walking briskly, mowing the yard, dancing, swimming, bicycling, etc.  Eat a healthy diet- Eat a variety of healthy food such as fruits, vegetables, low fat milk, low fat cheese, yogurt, lean meant, poultry, fish, beans, tofu, etc. For more information go to www.thenutritionsource.org  Drink alcohol in moderation- Limit alcohol intake to less than two drinks a day. Never drink and drive.  Dentist- Brush and floss twice daily; visit your dentist twice a year.  Depression- Your emotional health is as important as your physical health. If you're feeling down, or losing interest in things you would normally enjoy please talk to your healthcare provider.  Eye exam- Visit your eye doctor every year.  Safe sex- If you may be exposed to a sexually transmitted infection, use a condom.  Seat belts- Seat belts can save your life; always wear one.  Smoke/Carbon Monoxide detectors- These detectors need to be installed on the appropriate level of your home.  Replace batteries at least once a year.  Skin cancer- When out in the sun, cover up  and use sunscreen 15 SPF or higher.  Violence- If anyone is threatening you, please tell your healthcare provider.  Living Will/ Health care power of attorney- Speak with your healthcare provider and  family.   If you have lab work done today you will be contacted with your lab results within the next 2 weeks.  If you have not heard from [place] then please contact us. The fastest way to get your results is to register for My Chart.   IF you received an x-ray today, you will receive an invoice from [facility]. Please contact [facility] at [phone] with questions or concerns regarding your invoice.   IF you received labwork today, you will receive an invoice from [facility]. Please contact [facility] at [phone] with questions or concerns regarding your invoice.   Our billing staff will not be able to assist you with questions regarding bills from these companies.  You will be contacted with the lab results as soon as they are available. The fastest way to get your results is to activate your My Chart account. Instructions are located on the last page of this paperwork. If you have not heard from [place] regarding the results in 2 weeks, please contact this office.       Signed,   Merri Ray, MD [facility].  02/07/18 11:57 AM

## 2018-02-05 LAB — COMPREHENSIVE METABOLIC PANEL
A/G RATIO: 2.4 — AB (ref 1.2–2.2)
ALBUMIN: 4.6 g/dL (ref 3.5–5.5)
ALK PHOS: 57 IU/L (ref 39–117)
ALT: 56 IU/L — ABNORMAL HIGH (ref 0–44)
AST: 42 IU/L — AB (ref 0–40)
BILIRUBIN TOTAL: 0.9 mg/dL (ref 0.0–1.2)
BUN/Creatinine Ratio: 13 (ref 9–20)
BUN: 10 mg/dL (ref 6–24)
CHLORIDE: 98 mmol/L (ref 96–106)
CO2: 23 mmol/L (ref 20–29)
CREATININE: 0.75 mg/dL — AB (ref 0.76–1.27)
Calcium: 9.2 mg/dL (ref 8.7–10.2)
GFR calc Af Amer: 123 mL/min/{1.73_m2} (ref >59)
GFR calc non Af Amer: 106 mL/min/{1.73_m2} (ref >59)
GLOBULIN, TOTAL: 1.9 g/dL (ref 1.5–4.5)
Glucose: 120 mg/dL — ABNORMAL HIGH (ref 65–99)
POTASSIUM: 4 mmol/L (ref 3.5–5.2)
SODIUM: 137 mmol/L (ref 134–144)
Total Protein: 6.5 g/dL (ref 6.0–8.5)

## 2018-02-05 LAB — LIPID PANEL
CHOLESTEROL TOTAL: 198 mg/dL (ref 100–199)
Chol/HDL Ratio: 2.9 ratio (ref 0.0–5.0)
HDL: 69 mg/dL (ref >39)
LDL CALC: 111 mg/dL — AB (ref 0–99)
Triglycerides: 88 mg/dL (ref 0–149)
VLDL CHOLESTEROL CAL: 18 mg/dL (ref 5–40)

## 2018-02-05 LAB — HEMOGLOBIN A1C
ESTIMATED AVERAGE GLUCOSE: 126 mg/dL
HEMOGLOBIN A1C: 6 % — AB (ref 4.8–5.6)

## 2018-02-05 LAB — PSA: Prostate Specific Ag, Serum: 0.3 ng/mL (ref 0.0–4.0)

## 2018-03-06 ENCOUNTER — Telehealth: Payer: Self-pay | Admitting: Family Medicine

## 2018-03-06 NOTE — Telephone Encounter (Signed)
Called pt left VM for him to call and reschedule his 04/09/2018 appt . Due to Dr Neva SeatGreene out of office that day

## 2018-04-09 ENCOUNTER — Ambulatory Visit: Payer: 59 | Admitting: Family Medicine

## 2018-04-12 ENCOUNTER — Encounter: Payer: Self-pay | Admitting: Family Medicine

## 2018-04-12 ENCOUNTER — Ambulatory Visit: Payer: 59 | Admitting: Family Medicine

## 2018-04-12 ENCOUNTER — Ambulatory Visit (INDEPENDENT_AMBULATORY_CARE_PROVIDER_SITE_OTHER): Payer: 59

## 2018-04-12 ENCOUNTER — Other Ambulatory Visit: Payer: Self-pay

## 2018-04-12 VITALS — BP 132/88 | HR 93 | Temp 98.9°F | Resp 14 | Ht 67.0 in | Wt 169.8 lb

## 2018-04-12 DIAGNOSIS — M5441 Lumbago with sciatica, right side: Secondary | ICD-10-CM

## 2018-04-12 DIAGNOSIS — I10 Essential (primary) hypertension: Secondary | ICD-10-CM

## 2018-04-12 NOTE — Patient Instructions (Addendum)
No change in blood pressure medications.  Current readings look okay.  If you are measuring blood pressures over 140/90 at home, bring your meter in so we can verify it is accurate.  Glad to hear the back pain is improving, but suspect you do have some component of sciatica.  See information below on back pain and sciatica.  I will refer you to physical therapy.   Sciatica  Sciatica is pain, numbness, weakness, or tingling along the path of the sciatic nerve. The sciatic nerve starts in the lower back and runs down the back of each leg. The nerve controls the muscles in the lower leg and in the back of the knee. It also provides feeling (sensation) to the back of the thigh, the lower leg, and the sole of the foot. Sciatica is a symptom of another medical condition that pinches or puts pressure on the sciatic nerve. Generally, sciatica only affects one side of the body. Sciatica usually goes away on its own or with treatment. In some cases, sciatica may keep coming back (recur). What are the causes? This condition is caused by pressure on the sciatic nerve, or pinching of the sciatic nerve. This may be the result of:  A disk in between the bones of the spine (vertebrae) bulging out too far (herniated disk).  Age-related changes in the spinal disks (degenerative disk disease).  A pain disorder that affects a muscle in the buttock (piriformis syndrome).  Extra bone growth (bone spur) near the sciatic nerve.  An injury or break (fracture) of the pelvis.  Pregnancy.  Tumor (rare). What increases the risk? The following factors may make you more likely to develop this condition:  Playing sports that place pressure or stress on the spine, such as football or weight lifting.  Having poor strength and flexibility.  A history of back injury.  A history of back surgery.  Sitting for long periods of time.  Doing activities that involve repetitive bending or lifting.  Obesity. What are  the signs or symptoms? Symptoms can vary from mild to very severe, and they may include:  Any of these problems in the lower back, leg, hip, or buttock: ? Mild tingling or dull aches. ? Burning sensations. ? Sharp pains.  Numbness in the back of the calf or the sole of the foot.  Leg weakness.  Severe back pain that makes movement difficult. These symptoms may get worse when you cough, sneeze, or laugh, or when you sit or stand for long periods of time. Being overweight may also make symptoms worse. In some cases, symptoms may recur over time. How is this diagnosed? This condition may be diagnosed based on:  Your symptoms.  A physical exam. Your health care provider may ask you to do certain movements to check whether those movements trigger your symptoms.  You may have tests, including: ? Blood tests. ? X-rays. ? MRI. ? CT scan. How is this treated? In many cases, this condition improves on its own, without any treatment. However, treatment may include:  Reducing or modifying physical activity during periods of pain.  Exercising and stretching to strengthen your abdomen and improve the flexibility of your spine.  Icing and applying heat to the affected area.  Medicines that help: ? To relieve pain and swelling. ? To relax your muscles.  Injections of medicines that help to relieve pain, irritation, and inflammation around the sciatic nerve (steroids).  Surgery. Follow these instructions at home: Medicines  Take over-the-counter and prescription  medicines only as told by your health care provider.  Do not drive or operate heavy machinery while taking prescription pain medicine. Managing pain  If directed, apply ice to the affected area. ? Put ice in a plastic bag. ? Place a towel between your skin and the bag. ? Leave the ice on for 20 minutes, 2-3 times a day.  After icing, apply heat to the affected area before you exercise or as often as told by your health  care provider. Use the heat source that your health care provider recommends, such as a moist heat pack or a heating pad. ? Place a towel between your skin and the heat source. ? Leave the heat on for 20-30 minutes. ? Remove the heat if your skin turns bright red. This is especially important if you are unable to feel pain, heat, or cold. You may have a greater risk of getting burned. Activity  Return to your normal activities as told by your health care provider. Ask your health care provider what activities are safe for you. ? Avoid activities that make your symptoms worse.  Take brief periods of rest throughout the day. Resting in a lying or standing position is usually better than sitting to rest. ? When you rest for longer periods, mix in some mild activity or stretching between periods of rest. This will help to prevent stiffness and pain. ? Avoid sitting for long periods of time without moving. Get up and move around at least one time each hour.  Exercise and stretch regularly, as told by your health care provider.  Do not lift anything that is heavier than 10 lb (4.5 kg) while you have symptoms of sciatica. When you do not have symptoms, you should still avoid heavy lifting, especially repetitive heavy lifting.  When you lift objects, always use proper lifting technique, which includes: ? Bending your knees. ? Keeping the load close to your body. ? Avoiding twisting. General instructions  Use good posture. ? Avoid leaning forward while sitting. ? Avoid hunching over while standing.  Maintain a healthy weight. Excess weight puts extra stress on your back and makes it difficult to maintain good posture.  Wear supportive, comfortable shoes. Avoid wearing high heels.  Avoid sleeping on a mattress that is too soft or too hard. A mattress that is firm enough to support your back when you sleep may help to reduce your pain.  Keep all follow-up visits as told by your health care  provider. This is important. Contact a health care provider if:  You have pain that wakes you up when you are sleeping.  You have pain that gets worse when you lie down.  Your pain is worse than you have experienced in the past.  Your pain lasts longer than 4 weeks.  You experience unexplained weight loss. Get help right away if:  You lose control of your bowel or bladder (incontinence).  You have: ? Weakness in your lower back, pelvis, buttocks, or legs that gets worse. ? Redness or swelling of your back. ? A burning sensation when you urinate. This information is not intended to replace advice given to you by your health care provider. Make sure you discuss any questions you have with your health care provider. Document Released: 02/11/2001 Document Revised: 07/24/2015 Document Reviewed: 10/27/2014 Elsevier Interactive Patient Education  2019 [employer]  Acute Back Pain, Adult Acute back pain is sudden and usually short-lived. It is often caused by an injury to the muscles and  tissues in the back. The injury may result from:  A muscle or ligament getting overstretched or torn (strained). Ligaments are tissues that connect bones to each other. Lifting something improperly can cause a back strain.  Wear and tear (degeneration) of the spinal disks. Spinal disks are circular tissue that provides cushioning between the bones of the spine (vertebrae).  Twisting motions, such as while playing sports or doing yard work.  A hit to the back.  Arthritis. You may have a physical exam, lab tests, and imaging tests to find the cause of your pain. Acute back pain usually goes away with rest and home care. Follow these instructions at home: Managing pain, stiffness, and swelling  Take over-the-counter and prescription medicines only as told by your health care provider.  Your health care provider may recommend applying ice during the first 24-48 hours after your pain starts. To do  this: ? Put ice in a plastic bag. ? Place a towel between your skin and the bag. ? Leave the ice on for 20 minutes, 2-3 times a day.  If directed, apply heat to the affected area as often as told by your health care provider. Use the heat source that your health care provider recommends, such as a moist heat pack or a heating pad. ? Place a towel between your skin and the heat source. ? Leave the heat on for 20-30 minutes. ? Remove the heat if your skin turns bright red. This is especially important if you are unable to feel pain, heat, or cold. You have a greater risk of getting burned. Activity   Do not stay in bed. Staying in bed for more than 1-2 days can delay your recovery.  Sit up and stand up straight. Avoid leaning forward when you sit, or hunching over when you stand. ? If you work at a desk, sit close to it so you do not need to lean over. Keep your chin tucked in. Keep your neck drawn back, and keep your elbows bent at a right angle. Your arms should look like the letter "L." ? Sit high and close to the steering wheel when you drive. Add lower back (lumbar) support to your car seat, if needed.  Take short walks on even surfaces as soon as you are able. Try to increase the length of time you walk each day.  Do not sit, drive, or stand in one place for more than 30 minutes at a time. Sitting or standing for long periods of time can put stress on your back.  Do not drive or use heavy machinery while taking prescription pain medicine.  Use proper lifting techniques. When you bend and lift, use positions that put less stress on your back: ? [place]. ? Keep the load close to your body. ? Avoid twisting.  Exercise regularly as told by your health care provider. Exercising helps your back heal faster and helps prevent back injuries by keeping muscles strong and flexible.  Work with a physical therapist to make a safe exercise program, as recommended by your health care  provider. Do any exercises as told by your physical therapist. Lifestyle  Maintain a healthy weight. Extra weight puts stress on your back and makes it difficult to have good posture.  Avoid activities or situations that make you feel anxious or stressed. Stress and anxiety increase muscle tension and can make back pain worse. Learn ways to manage anxiety and stress, such as through exercise. General instructions  Sleep  on a firm mattress in a comfortable position. Try lying on your side with your knees slightly bent. If you lie on your back, put a pillow under your knees.  Follow your treatment plan as told by your health care provider. This may include: ? Cognitive or behavioral therapy. ? Acupuncture or massage therapy. ? Meditation or yoga. Contact a health care provider if:  You have pain that is not relieved with rest or medicine.  You have increasing pain going down into your legs or buttocks.  Your pain does not improve after 2 weeks.  You have pain at night.  You lose weight without trying.  You have a fever or chills. Get help right away if:  You develop new bowel or bladder control problems.  You have unusual weakness or numbness in your arms or legs.  You develop nausea or vomiting.  You develop abdominal pain.  You feel faint. Summary  Acute back pain is sudden and usually short-lived.  Use proper lifting techniques. When you bend and lift, use positions that put less stress on your back.  Take over-the-counter and prescription medicines and apply heat or ice as directed by your health care provider. This information is not intended to replace advice given to you by your health care provider. Make sure you discuss any questions you have with your health care provider. Document Released: 02/17/2005 Document Revised: 09/24/2017 Document Reviewed: 10/01/2016 Elsevier Interactive Patient Education  [employer].    If you have lab work done today you  will be contacted with your lab results within the next 2 weeks.  If you have not heard from [place] then please contact us. The fastest way to get your results is to register for My Chart.   IF you received an x-ray today, you will receive an invoice from [facility]. Please contact [facility] at [phone] with questions or concerns regarding your invoice.   IF you received labwork today, you will receive an invoice from [facility]. Please contact [facility] at [phone] with questions or concerns regarding your invoice.   Our billing staff will not be able to assist you with questions regarding bills from these companies.  You will be contacted with the lab results as soon as they are available. The fastest way to get your results is to activate your My Chart account. Instructions are located on the last page of this paperwork. If you have not heard from [place] regarding the results in 2 weeks, please contact this office.

## 2018-04-12 NOTE — Progress Notes (Signed)
Subjective:    Patient ID: Zachary Woods, male    DOB: 02-07-1966, 53 y.o.   MRN: [id number]  HPI Zachary Woods is a 53 y.o. male Presents today for: Chief Complaint  Patient presents with  . Hypertension    was last seen on 02/04/18. Patient stated he is doing well on the increase of hctz. He also stated he has an appt with urogly on 04/20/18  . Back Pain    lower back pain radiating down leg, numbness and tingling in feet. Post lifting heavy shock vac   Hypertension: BP Readings from Last 3 Encounters:  04/12/18 132/88  02/04/18 (!) 150/90  10/27/17 (!) 142/92   Lab Results  Component Value Date   CREATININE 0.75 (L) 02/04/2018  Persistent elevated readings when discussed in December.  Added HCTZ 12.5 mg daily, continued Norvasc 10 mg daily. No new side effects on this combo. Home readings up in 150's at times. No recent measurement.   Low back pain: Pain has been going on for a few months. Started after picking up gas tank end of December. Soreness initially in R lower back into R leg - burning pain went to R leg.  No treatments initially. Picked up shop vac 3 weeks ago. More sore in R low back about an hour later. Has been improving with med from podiatry as below.  Has been avoiding heavy lifting. No bowel or bladder incontinence, no saddle anesthesia, no lower extremity weakness.   Saw Dr. Elijah Birk (foot doctor),  Saw 1st of January - had sesamoid fractures? Arthritis in feet. Started on Duexis after boot for 10 days and cortisone shots in feet. On duexis BID.    Patient Active Problem List   Diagnosis Date Noted  . PFO (patent foramen ovale)   . Cerebral embolism with cerebral infarction 07/20/2016  . Stroke determined by clinical assessment (HCC) 07/20/2016  . Hypertension 07/19/2016  . GERD (gastroesophageal reflux disease) 07/19/2016  . Malignant hypertension 07/19/2016   Past Medical History:  Diagnosis Date  . Acid reflux   . CVA (cerebral vascular accident) (HCC)    . Hypertension    Past Surgical History:  Procedure Laterality Date  . NO PAST SURGERIES    . PATENT FORAMEN OVALE(PFO) CLOSURE N/A 08/01/2016   Procedure: Patent Forament [place](PFO) Closure;  Surgeon: Tonny Bollman, MD;  Location: [facility] INVASIVE CV LAB;  Service: Cardiovascular;  Laterality: N/A;  . TEE WITHOUT CARDIOVERSION N/A 07/21/2016   Procedure: TRANSESOPHAGEAL ECHOCARDIOGRAM (TEE);  Surgeon: Lars Masson, MD;  Location: [facility] ENDOSCOPY;  Service: Cardiovascular;  Laterality: N/A;   Allergies  Allergen Reactions  . Penicillins Itching    Has patient had a PCN reaction causing immediate rash, facial/tongue/throat swelling, SOB or lightheadedness with hypotension: No Has patient had a PCN reaction causing severe rash involving mucus membranes or skin necrosis: No Has patient had a PCN reaction that required hospitalization: No Has patient had a PCN reaction occurring within the last 10 years: Yes If all of the above answers are "NO", then may proceed with Cephalosporin use.   Prior to Admission medications   Medication Sig Start Date End Date Taking? Authorizing Provider  amLODipine (NORVASC) 10 MG tablet Take 1 tablet (10 mg total) by mouth daily. 02/04/18  Yes Shade Flood, MD  aspirin EC 81 MG tablet Take 1 tablet (81 mg total) by mouth daily. 08/05/17  Yes Janetta Hora, PA-C  hydrochlorothiazide (MICROZIDE) 12.5 MG capsule Take 1 capsule (12.5 mg total)  by mouth daily. 02/04/18  Yes Shade FloodGreene, Ciro Tashiro R, MD  Ibuprofen-Famotidine (DUEXIS) 800-26.6 MG TABS Take by mouth.   Yes [provider]  Multiple Vitamin (MULTIVITAMIN WITH MINERALS) TABS tablet Take 1 tablet by mouth daily.   Yes [provider]  pantoprazole (PROTONIX) 40 MG tablet TAKE 1 TABLET BY MOUTH EVERY DAY 03/25/17  Yes Tonny Bollmanooper, Michael, MD  sildenafil (VIAGRA) 100 MG tablet Take 0.5-1 tablets (50-100 mg total) by mouth daily as needed for erectile dysfunction. 11/27/16  Yes Shade FloodGreene, Dorma Altman R,  MD   Social History   Socioeconomic History  . Marital status: Single    Spouse name: Not on file  . Number of children: Not on file  . Years of education: Not on file  . Highest education level: Not on file  Occupational History  . Occupation: [employer]  Social Needs  . Financial resource strain: Not on file  . Food insecurity:    Worry: Not on file    Inability: Not on file  . Transportation needs:    Medical: Not on file    Non-medical: Not on file  Tobacco Use  . Smoking status: Former Games developermoker  . Smokeless tobacco: Never Used  Substance and Sexual Activity  . Alcohol use: Yes    Alcohol/week: 1.0 standard drinks    Types: 1 Shots of liquor per week    Comment: 10-12/ week-- (2-4 drinks/ day)  . Drug use: No  . Sexual activity: Yes  Lifestyle  . Physical activity:    Days per week: Not on file    Minutes per session: Not on file  . Stress: Not on file  Relationships  . Social connections:    Talks on phone: Not on file    Gets together: Not on file    Attends religious service: Not on file    Active member of club or organization: Not on file    Attends meetings of clubs or organizations: Not on file    Relationship status: Not on file  . Intimate partner violence:    Fear of current or ex partner: Not on file    Emotionally abused: Not on file    Physically abused: Not on file    Forced sexual activity: Not on file  Other Topics Concern  . Not on file  Social History Narrative  . Not on file     Review of Systems  Constitutional: Negative for chills, diaphoresis, fatigue, fever and unexpected weight change.  Eyes: Negative for visual disturbance.  Respiratory: Negative for cough, chest tightness and shortness of breath.   Cardiovascular: Negative for chest pain, palpitations and leg swelling.  Gastrointestinal: Negative for abdominal pain and blood in stool.  Genitourinary: Negative for difficulty urinating and hematuria.  Neurological: Negative for  dizziness, light-headedness and headaches.       Objective:   Physical Exam Vitals signs reviewed.  Constitutional:      Appearance: He is well-developed.  HENT:     Head: Normocephalic and atraumatic.  Eyes:     Pupils: Pupils are equal, round, and reactive to light.  Neck:     Musculoskeletal: Normal range of motion.     Vascular: No carotid bruit or JVD.  Cardiovascular:     Rate and Rhythm: Normal rate and regular rhythm.     Heart sounds: Normal heart sounds. No murmur.  Pulmonary:     Effort: Pulmonary effort is normal.     Breath sounds: Normal breath sounds. No rales.  Abdominal:  Palpations: Abdomen is soft.     Tenderness: There is no abdominal tenderness.  Musculoskeletal: Normal range of motion.     Lumbar back: He exhibits tenderness (min midline lower LS spine, negative seated straight leg raise bilaterally.). He exhibits normal range of motion (good rom. ) and no bony tenderness.  Skin:    General: Skin is warm and dry.  Neurological:     Mental Status: He is alert and oriented to person, place, and time.     Sensory: No sensory deficit.     Deep Tendon Reflexes:     Reflex Scores:      Patellar reflexes are 2+ on the right side and 2+ on the left side.      Achilles reflexes are 2+ on the right side and 2+ on the left side.    Comments: Able to heel and toe walk without difficulty.  Psychiatric:        Behavior: Behavior normal.    Vitals:   04/12/18 0955  BP: 132/88  Pulse: 93  Resp: 14  Temp: 98.9 F (37.2 C)  TempSrc: Oral  SpO2: 100%  Weight: 169 lb 12.8 oz (77 kg)  Height: 5\' 7"  (1.702 m)   Dg Lumbar Spine Complete  Result Date: 04/12/2018 CLINICAL DATA:  Lumbago with right-sided radicular symptoms EXAM: LUMBAR SPINE - COMPLETE 4+ VIEW COMPARISON:  None. FINDINGS: Frontal, lateral, spot lumbosacral lateral, and bilateral oblique views were obtained. There are 5 non-rib-bearing lumbar type vertebral bodies. There is no fracture or  spondylolisthesis. Disc spaces appear normal. Small anterior osteophytes are noted at all levels. There is facet osteoarthritic change at L4-5 and L5-S1 bilaterally. There is aortic atherosclerosis. IMPRESSION: Facet osteoarthritic change at L4-5 and L5-S1. No appreciable disc space narrowing. No fracture or spondylolisthesis. There are foci of aortic atherosclerosis. Electronically Signed   By: Bretta Bang III M.D.   On: 04/12/2018 10:33       Assessment & Plan:   Zachary Woods is a 53 y.o. male Essential hypertension  -Improved control, continue same regimen.  Right-sided low back pain with right-sided sciatica, unspecified chronicity - Plan: DG Lumbar Spine Complete, Ambulatory referral to Physical Therapy  -Suspected strain in December and repeat injury few weeks later.  Improving on Duexis. No concerning findings on imaging. Referred to physical therapy.  RTC precautions if worsening.  No orders of the defined types were placed in this encounter.  Patient Instructions    No change in blood pressure medications.  Current readings look okay.  If you are measuring blood pressures over 140/90 at home, bring your meter in so we can verify it is accurate.  Glad to hear the back pain is improving, but suspect you do have some component of sciatica.  See information below on back pain and sciatica.  I will let you know if there are concerns on your x-ray and will refer you to physical therapy.   Sciatica  Sciatica is pain, numbness, weakness, or tingling along the path of the sciatic nerve. The sciatic nerve starts in the lower back and runs down the back of each leg. The nerve controls the muscles in the lower leg and in the back of the knee. It also provides feeling (sensation) to the back of the thigh, the lower leg, and the sole of the foot. Sciatica is a symptom of another medical condition that pinches or puts pressure on the sciatic nerve. Generally, sciatica only affects one  side of the body. Sciatica  usually goes away on its own or with treatment. In some cases, sciatica may keep coming back (recur). What are the causes? This condition is caused by pressure on the sciatic nerve, or pinching of the sciatic nerve. This may be the result of:  A disk in between the bones of the spine (vertebrae) bulging out too far (herniated disk).  Age-related changes in the spinal disks (degenerative disk disease).  A pain disorder that affects a muscle in the buttock (piriformis syndrome).  Extra bone growth (bone spur) near the sciatic nerve.  An injury or break (fracture) of the pelvis.  Pregnancy.  Tumor (rare). What increases the risk? The following factors may make you more likely to develop this condition:  Playing sports that place pressure or stress on the spine, such as football or weight lifting.  Having poor strength and flexibility.  A history of back injury.  A history of back surgery.  Sitting for long periods of time.  Doing activities that involve repetitive bending or lifting.  Obesity. What are the signs or symptoms? Symptoms can vary from mild to very severe, and they may include:  Any of these problems in the lower back, leg, hip, or buttock: ? Mild tingling or dull aches. ? Burning sensations. ? Sharp pains.  Numbness in the back of the calf or the sole of the foot.  Leg weakness.  Severe back pain that makes movement difficult. These symptoms may get worse when you cough, sneeze, or laugh, or when you sit or stand for long periods of time. Being overweight may also make symptoms worse. In some cases, symptoms may recur over time. How is this diagnosed? This condition may be diagnosed based on:  Your symptoms.  A physical exam. Your health care provider may ask you to do certain movements to check whether those movements trigger your symptoms.  You may have tests, including: ? Blood tests. ? X-rays. ? MRI. ? CT scan. How is  this treated? In many cases, this condition improves on its own, without any treatment. However, treatment may include:  Reducing or modifying physical activity during periods of pain.  Exercising and stretching to strengthen your abdomen and improve the flexibility of your spine.  Icing and applying heat to the affected area.  Medicines that help: ? To relieve pain and swelling. ? To relax your muscles.  Injections of medicines that help to relieve pain, irritation, and inflammation around the sciatic nerve (steroids).  Surgery. Follow these instructions at home: Medicines  Take over-the-counter and prescription medicines only as told by your health care provider.  Do not drive or operate heavy machinery while taking prescription pain medicine. Managing pain  If directed, apply ice to the affected area. ? Put ice in a plastic bag. ? Place a towel between your skin and the bag. ? Leave the ice on for 20 minutes, 2-3 times a day.  After icing, apply heat to the affected area before you exercise or as often as told by your health care provider. Use the heat source that your health care provider recommends, such as a moist heat pack or a heating pad. ? Place a towel between your skin and the heat source. ? Leave the heat on for 20-30 minutes. ? Remove the heat if your skin turns bright red. This is especially important if you are unable to feel pain, heat, or cold. You may have a greater risk of getting burned. Activity  Return to your normal activities as told  by your health care provider. Ask your health care provider what activities are safe for you. ? Avoid activities that make your symptoms worse.  Take brief periods of rest throughout the day. Resting in a lying or standing position is usually better than sitting to rest. ? When you rest for longer periods, mix in some mild activity or stretching between periods of rest. This will help to prevent stiffness and pain. ? Avoid  sitting for long periods of time without moving. Get up and move around at least one time each hour.  Exercise and stretch regularly, as told by your health care provider.  Do not lift anything that is heavier than 10 lb (4.5 kg) while you have symptoms of sciatica. When you do not have symptoms, you should still avoid heavy lifting, especially repetitive heavy lifting.  When you lift objects, always use proper lifting technique, which includes: ? Bending your knees. ? Keeping the load close to your body. ? Avoiding twisting. General instructions  Use good posture. ? Avoid leaning forward while sitting. ? Avoid hunching over while standing.  Maintain a healthy weight. Excess weight puts extra stress on your back and makes it difficult to maintain good posture.  Wear supportive, comfortable shoes. Avoid wearing high heels.  Avoid sleeping on a mattress that is too soft or too hard. A mattress that is firm enough to support your back when you sleep may help to reduce your pain.  Keep all follow-up visits as told by your health care provider. This is important. Contact a health care provider if:  You have pain that wakes you up when you are sleeping.  You have pain that gets worse when you lie down.  Your pain is worse than you have experienced in the past.  Your pain lasts longer than 4 weeks.  You experience unexplained weight loss. Get help right away if:  You lose control of your bowel or bladder (incontinence).  You have: ? Weakness in your lower back, pelvis, buttocks, or legs that gets worse. ? Redness or swelling of your back. ? A burning sensation when you urinate. This information is not intended to replace advice given to you by your health care provider. Make sure you discuss any questions you have with your health care provider. Document Released: 02/11/2001 Document Revised: 07/24/2015 Document Reviewed: 10/27/2014 Elsevier Interactive Patient Education  2019  [employer]  Acute Back Pain, Adult Acute back pain is sudden and usually short-lived. It is often caused by an injury to the muscles and tissues in the back. The injury may result from:  A muscle or ligament getting overstretched or torn (strained). Ligaments are tissues that connect bones to each other. Lifting something improperly can cause a back strain.  Wear and tear (degeneration) of the spinal disks. Spinal disks are circular tissue that provides cushioning between the bones of the spine (vertebrae).  Twisting motions, such as while playing sports or doing yard work.  A hit to the back.  Arthritis. You may have a physical exam, lab tests, and imaging tests to find the cause of your pain. Acute back pain usually goes away with rest and home care. Follow these instructions at home: Managing pain, stiffness, and swelling  Take over-the-counter and prescription medicines only as told by your health care provider.  Your health care provider may recommend applying ice during the first 24-48 hours after your pain starts. To do this: ? Put ice in a plastic bag. ? Place a towel  between your skin and the bag. ? Leave the ice on for 20 minutes, 2-3 times a day.  If directed, apply heat to the affected area as often as told by your health care provider. Use the heat source that your health care provider recommends, such as a moist heat pack or a heating pad. ? Place a towel between your skin and the heat source. ? Leave the heat on for 20-30 minutes. ? Remove the heat if your skin turns bright red. This is especially important if you are unable to feel pain, heat, or cold. You have a greater risk of getting burned. Activity   Do not stay in bed. Staying in bed for more than 1-2 days can delay your recovery.  Sit up and stand up straight. Avoid leaning forward when you sit, or hunching over when you stand. ? If you work at a desk, sit close to it so you do not need to lean over. Keep  your chin tucked in. Keep your neck drawn back, and keep your elbows bent at a right angle. Your arms should look like the letter "L." ? Sit high and close to the steering wheel when you drive. Add lower back (lumbar) support to your car seat, if needed.  Take short walks on even surfaces as soon as you are able. Try to increase the length of time you walk each day.  Do not sit, drive, or stand in one place for more than 30 minutes at a time. Sitting or standing for long periods of time can put stress on your back.  Do not drive or use heavy machinery while taking prescription pain medicine.  Use proper lifting techniques. When you bend and lift, use positions that put less stress on your back: ? [place]end your knees. ? Keep the load close to your body. ? Avoid twisting.  Exercise regularly as told by your health care provider. Exercising helps your back heal faster and helps prevent back injuries by keeping muscles strong and flexible.  Work with a physical therapist to make a safe exercise program, as recommended by your health care provider. Do any exercises as told by your physical therapist. Lifestyle  Maintain a healthy weight. Extra weight puts stress on your back and makes it difficult to have good posture.  Avoid activities or situations that make you feel anxious or stressed. Stress and anxiety increase muscle tension and can make back pain worse. Learn ways to manage anxiety and stress, such as through exercise. General instructions  Sleep on a firm mattress in a comfortable position. Try lying on your side with your knees slightly bent. If you lie on your back, put a pillow under your knees.  Follow your treatment plan as told by your health care provider. This may include: ? Cognitive or behavioral therapy. ? Acupuncture or massage therapy. ? Meditation or yoga. Contact a health care provider if:  You have pain that is not relieved with rest or medicine.  You have increasing  pain going down into your legs or buttocks.  Your pain does not improve after 2 weeks.  You have pain at night.  You lose weight without trying.  You have a fever or chills. Get help right away if:  You develop new bowel or bladder control problems.  You have unusual weakness or numbness in your arms or legs.  You develop nausea or vomiting.  You develop abdominal pain.  You feel faint. Summary  Acute back pain is sudden  and usually short-lived.  Use proper lifting techniques. When you bend and lift, use positions that put less stress on your back.  Take over-the-counter and prescription medicines and apply heat or ice as directed by your health care provider. This information is not intended to replace advice given to you by your health care provider. Make sure you discuss any questions you have with your health care provider. Document Released: 02/17/2005 Document Revised: 09/24/2017 Document Reviewed: 10/01/2016 Elsevier Interactive Patient Education  [employer].    If you have lab work done today you will be contacted with your lab results within the next 2 weeks.  If you have not heard from [place] then please contact us. The fastest way to get your results is to register for My Chart.   IF you received an x-ray today, you will receive an invoice from [facility]. Please contact [facility] at [phone] with questions or concerns regarding your invoice.   IF you received labwork today, you will receive an invoice from [facility]. Please contact [facility] at [phone] with questions or concerns regarding your invoice.   Our billing staff will not be able to assist you with questions regarding bills from these companies.  You will be contacted with the lab results as soon as they are available. The fastest way to get your results is to activate your My Chart account. Instructions are located on the last page of this paperwork. If you have not heard  from [place] regarding the results in 2 weeks, please contact this office.       Signed,   [name], MD [facility].  04/12/18 10:27 AM

## 2018-06-23 ENCOUNTER — Other Ambulatory Visit: Payer: Self-pay | Admitting: Family Medicine

## 2018-06-23 DIAGNOSIS — I10 Essential (primary) hypertension: Secondary | ICD-10-CM

## 2018-06-23 NOTE — Telephone Encounter (Signed)
Requested Prescriptions  Pending Prescriptions Disp Refills  . hydrochlorothiazide (MICROZIDE) 12.5 MG capsule [Pharmacy Med Name: HYDROCHLOROTHIAZIDE 12.5 MG CP] 90 capsule 1    Sig: TAKE 1 CAPSULE BY MOUTH EVERY DAY     Cardiovascular: Diuretics - Thiazide Failed - 06/23/2018  9:02 AM      Failed - Cr in normal range and within 360 days    Creatinine, Ser  Date Value Ref Range Status  02/04/2018 0.75 (L) 0.76 - 1.27 mg/dL Final         Passed - Ca in normal range and within 360 days    Calcium  Date Value Ref Range Status  02/04/2018 9.2 8.7 - 10.2 mg/dL Final         Passed - K in normal range and within 360 days    Potassium  Date Value Ref Range Status  02/04/2018 4.0 3.5 - 5.2 mmol/L Final         Passed - Na in normal range and within 360 days    Sodium  Date Value Ref Range Status  02/04/2018 137 134 - 144 mmol/L Final         Passed - Last BP in normal range    BP Readings from Last 1 Encounters:  04/12/18 132/88         Passed - Valid encounter within last 6 months    Recent Outpatient Visits          2 months ago Essential hypertension   [facility], Asencion Partridge, MD   4 months ago Annual physical exam   [facility], Asencion Partridge, MD   7 months ago Essential hypertension   [facility], Asencion Partridge, MD   8 months ago Contact dermatitis, unspecified contact dermatitis type, unspecified trigger   [facility], Asencion Partridge, MD   1 year ago Annual physical exam   [facility], Asencion Partridge, MD

## 2018-07-08 NOTE — Research (Signed)
LATE ENTRY     Saw patient at 1 month office visit at [address] office. Patient doing well not problems. Will see patient at 6 month office visit.        [place] Aneesha Holloran 09-17-16 15:00

## 2018-07-08 NOTE — Research (Signed)
LATE ENTRY   Saw patient for 12 month office visit. Patient doing well with no problems. Patient will now move to phone calls. Will contact patient sometime between 05-04-18 to 10-31-18        Zachary Woods 08-05-2017

## 2018-07-28 IMAGING — MR MR HEAD W/O CM
13 of 21 series · 23 of 48 positions shown · IV contrast (multihance)
Comparison: CT HEAD July 19, 2016 at 5501 hours

CLINICAL DATA: Slurred speech and RIGHT sided numbness and tingling
beginning yesterday at noon, word-finding difficulties. Symptoms
predominately resolved. History of hypertension.

EXAM:
MRI HEAD WITHOUT CONTRAST
MRA HEAD WITHOUT CONTRAST
MRA NECK WITHOUT AND WITH CONTRAST
TECHNIQUE: Multiplanar, multiecho pulse sequences of the brain and surrounding
structures were obtained without intravenous contrast. Angiographic
images of the Circle of Willis were obtained using MRA technique
without intravenous contrast. Angiographic images of the neck were
obtained using MRA technique without and with intravenous contrast.
Carotid stenosis measurements (when applicable) are obtained
utilizing NASCET criteria, using the distal internal carotid
diameter as the denominator.
CONTRAST:  15mL MULTIHANCE GADOBENATE DIMEGLUMINE 529 MG/ML IV SOLN

[Series 4: (id) mt fs · axial · 1.4mm · 0.43mm/px · z∈[-62,+33]mm · 2 of 136 slices shown]
[im 1/136]
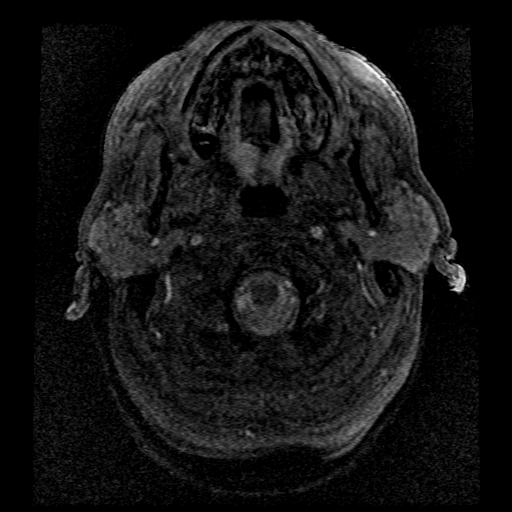
[im 136/136]
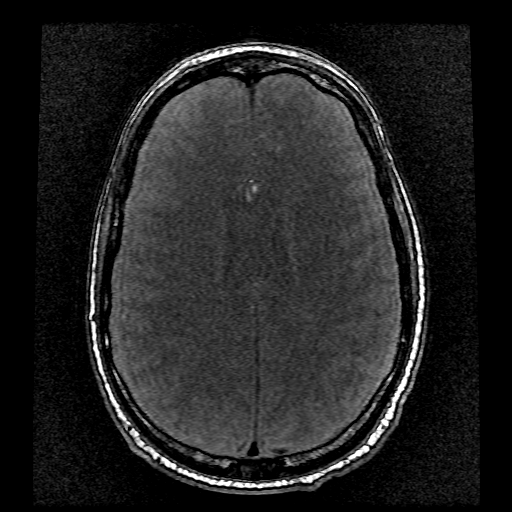

[Series 5: T1 · sagittal · 5.0mm · 0.47mm/px · 1 of 26 slices shown]
[im 1/26]
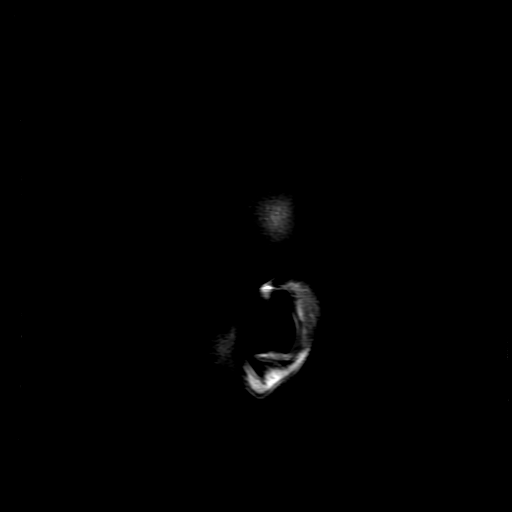

[Series 6: DWI · axial · 3.0mm · 1.09mm/px · z∈[-64,+83]mm · 2 of 100 slices shown (1 of 4)]
[im 1/100]
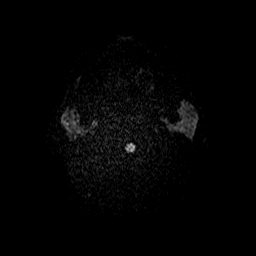
[im 100/100]
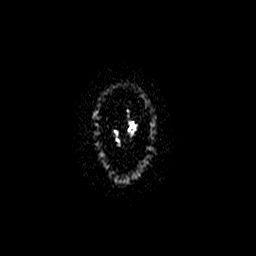

[Series 7: T2 · axial · 5.0mm · 0.43mm/px · 1 of 25 slices shown]
[im 1/25]
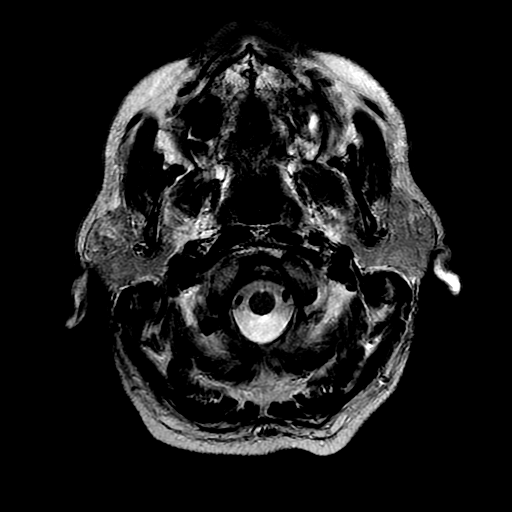

[Series 8: DWI · coronal · 5.0mm · 1.09mm/px · 1 of 76 slices shown (2 of 4)]
[im 1/76]
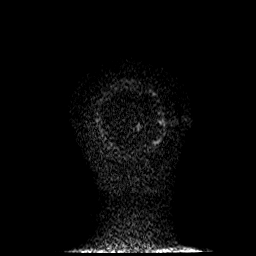

[Series 9: FLAIR · axial · 5.0mm · 0.43mm/px · 1 of 25 slices shown]
[im 1/25]
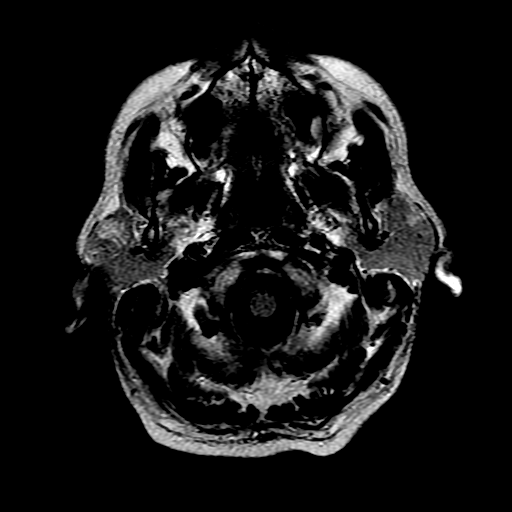

[Series 10: ax mpgr · axial · 5.0mm · 0.43mm/px · 1 of 25 slices shown]
[im 1/25]
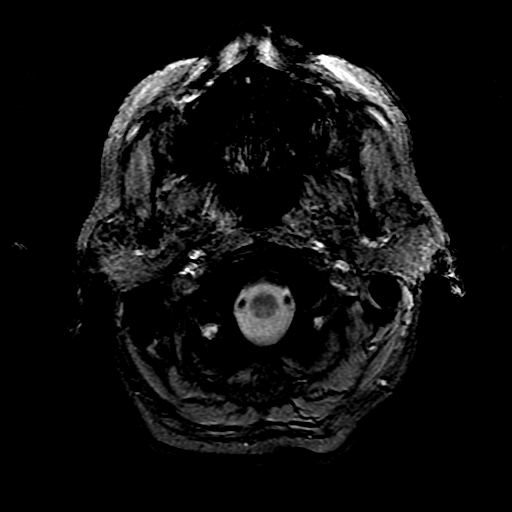

[Series 12: T2 post-contrast · coronal · 5.0mm · 0.39mm/px · 1 of 29 slices shown]
[im 1/29]
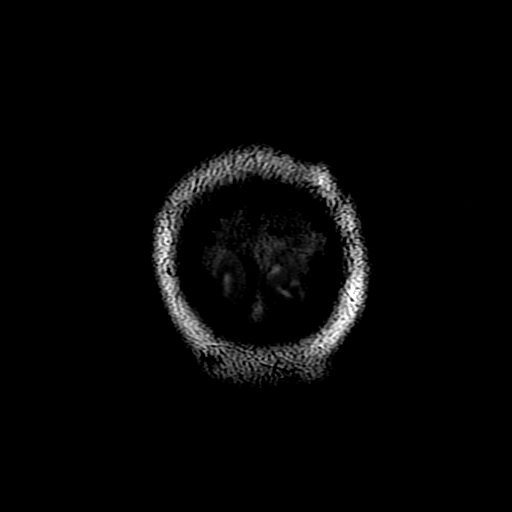

[Series 16: ax (id) fspgr · axial · 2.0mm · 0.94mm/px · 1 of 1 slices shown (1 of 2)]
[im 1/1]
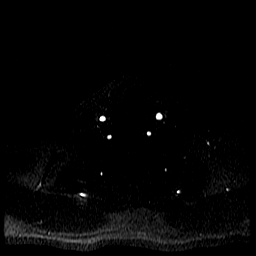

[Series 18: sag inhance carotid · sagittal · 1.6mm · 0.68mm/px · 8 of 544 slices shown]
[im 1/544]
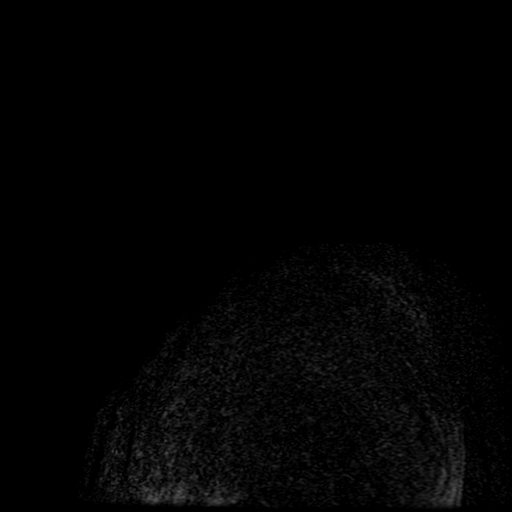
[im 91/544]
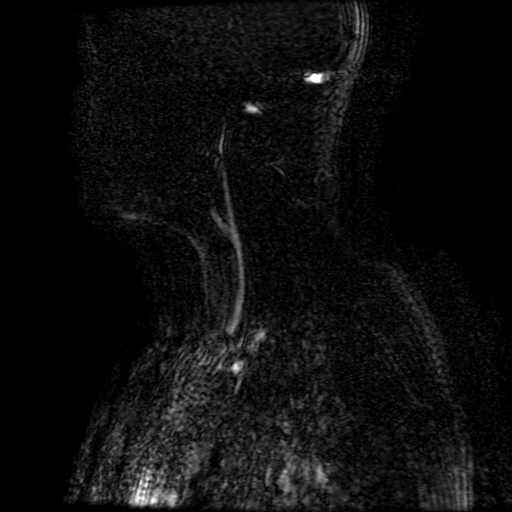
[im 182/544]
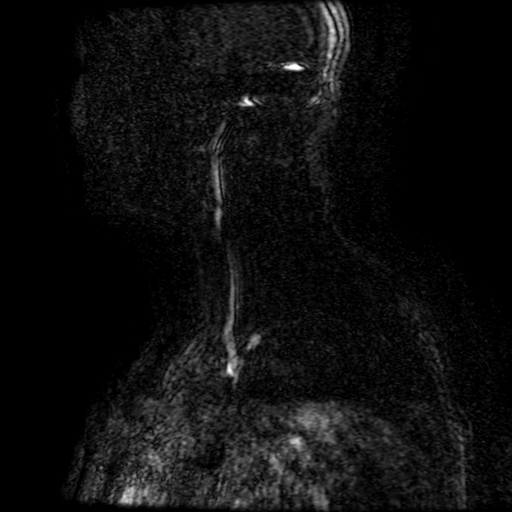
[im 227/544]
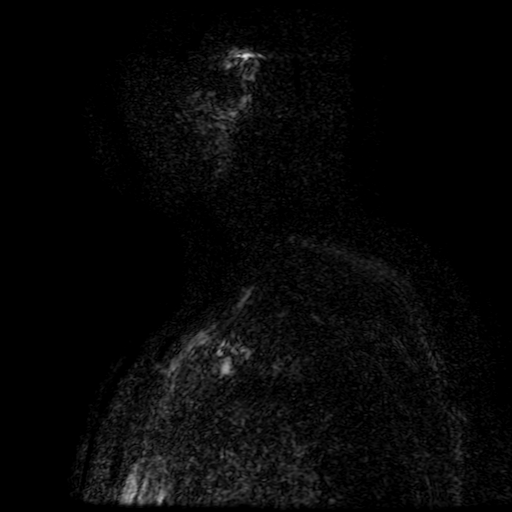
[im 317/544]
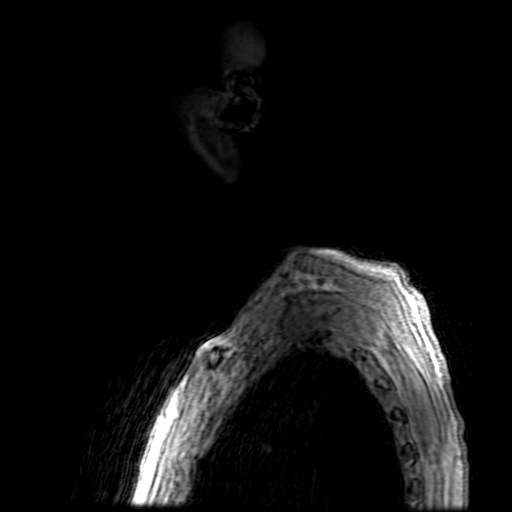
[im 363/544]
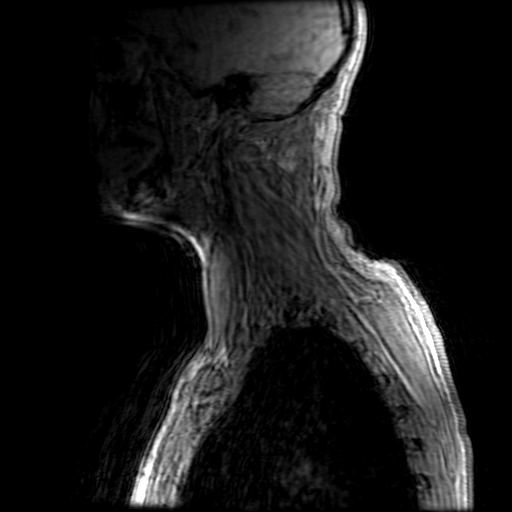
[im 453/544]
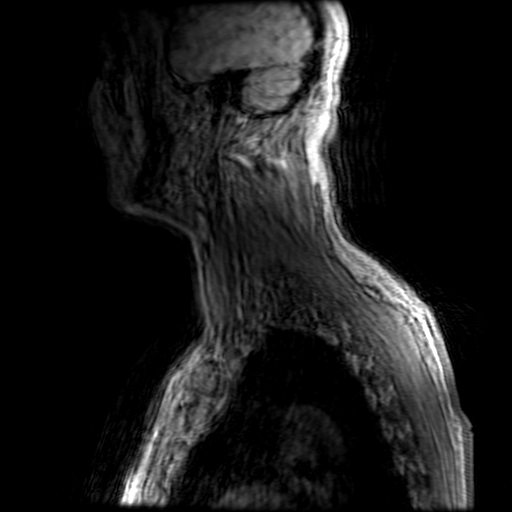
[im 544/544]
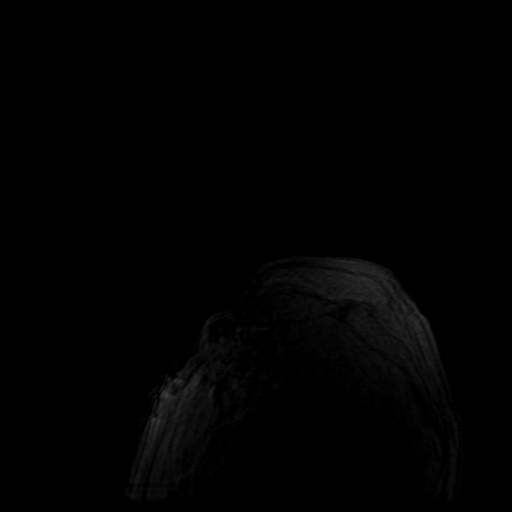

[Series 19: ax (id) fspgr · axial · 2.2mm · 0.98mm/px · z∈[-268,-186]mm · 2 of 178 slices shown (2 of 2)]
[im 1/178]
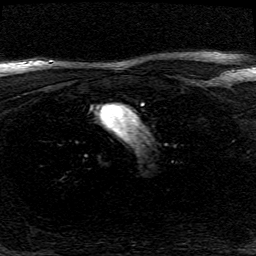
[im 60/178]
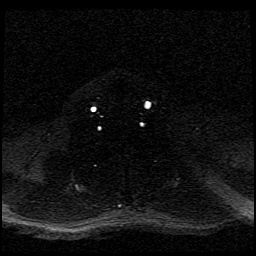

[Series 600: DWI · axial · 3.0mm · 1.09mm/px · 1 of 50 slices shown (3 of 4)]
[im 1/50]
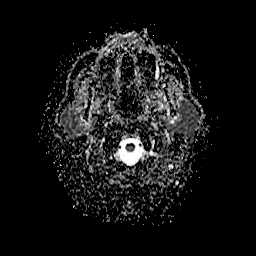

[Series 800: DWI · coronal · 5.0mm · 1.09mm/px · 1 of 38 slices shown (4 of 4)]
[im 1/38]
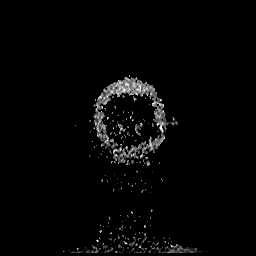

[23 of 48 positions shown; findings below may reference images not displayed]

FINDINGS: MRI HEAD FINDINGS- mildly motion degraded examination.

BRAIN: Multiple subcentimeter meter foci of reduced diffusion and
FLAIR T2 hyperintense signal LEFT frontal lobe, with low ADC values.
Subcentimeter linear reduced diffusion LEFT periventricular caudate
tail. No susceptibility artifact to suggest hemorrhage. The
ventricles and sulci are normal for patient's age. No suspicious
parenchymal signal, masses or mass effect. No abnormal extra-axial
fluid collections.

VASCULAR: Normal major intracranial vascular flow voids present at
skull base.

SKULL AND UPPER CERVICAL SPINE: No abnormal sellar expansion. No
suspicious calvarial bone marrow signal. Craniocervical junction
maintained.

SINUSES/ORBITS: LEFT maxillary mucosal retention cysts. Mastoid air
cells are well aerated. The included ocular globes and orbital
contents are non-suspicious.

OTHER: Patient is edentulous.

MRA HEAD FINDINGS- mildly motion degraded examination.

ANTERIOR CIRCULATION: Normal flow related enhancement of the
included cervical, petrous, cavernous and supraclinoid internal
carotid arteries. Patent anterior communicating artery. Normal flow
related enhancement of the anterior and middle cerebral arteries,
including distal segments. Mild luminal irregularity LEFT M1
segment. Focal moderate stenosis LEFT M2 inferior segment origin.

No large vessel occlusion, high-grade stenosis, aneurysm.

POSTERIOR CIRCULATION: LEFT vertebral artery is dominant. Basilar
artery is patent, with normal flow related enhancement of the main
branch vessels. Normal flow related enhancement of the posterior
cerebral arteries.

No large vessel occlusion, high-grade stenosis, abnormal luminal
irregularity, aneurysm.

ANATOMIC VARIANTS: None.

Source images and MIP images were reviewed.

MRA NECK FINDINGS

ANTERIOR CIRCULATION: The common carotid arteries are widely patent
bilaterally. The carotid bifurcations are patent bilaterally without
hemodynamically significant stenosis by NASCET criteria. No flow
limiting stenosis or luminal irregularity.

POSTERIOR CIRCULATION: Bilateral vertebral arteries are patent to
the vertebrobasilar junction. No flow limiting stenosis or luminal
irregularity.

Source images and MIP images were reviewed.
IMPRESSION: MRI HEAD: Acute subcentimeter LEFT MCA territory infarcts most
compatible with embolic etiology.

Otherwise negative mildly motion degraded noncontrast MRI head.

MRA HEAD: No emergent large vessel occlusion.

Mild LEFT M1 luminal regularity and moderate stenosis LEFT M2 origin
secondary to atherosclerosis, thromboembolism or motion artifact.

MRA NECK: No hemodynamically significant stenosis or acute vascular
process.

## 2018-08-18 ENCOUNTER — Telehealth: Payer: Self-pay | Admitting: Family Medicine

## 2018-08-18 ENCOUNTER — Other Ambulatory Visit: Payer: Self-pay | Admitting: Emergency Medicine

## 2018-08-18 DIAGNOSIS — L259 Unspecified contact dermatitis, unspecified cause: Secondary | ICD-10-CM

## 2018-08-18 MED ORDER — TRIAMCINOLONE ACETONIDE 0.5 % EX OINT: 1.0000 "application " | TOPICAL_OINTMENT | Freq: Three times a day (TID) | CUTANEOUS | 0 refills | Status: DC | PRN

## 2018-08-18 NOTE — Telephone Encounter (Signed)
Pt called in and would like to know if Dr could call in Triamcinolone Acctonide 0.5% . He stated he got into some poison oak and that is the only thing that will clear it up   Pharmacy -[facility] on [address]

## 2018-08-18 NOTE — Telephone Encounter (Signed)
One time curtesy has been sent into the pharmcy. If no better need an appt to be seen

## 2018-09-08 ENCOUNTER — Encounter: Payer: Self-pay | Admitting: *Deleted

## 2018-09-08 DIAGNOSIS — Z006 Encounter for examination for normal comparison and control in clinical research program: Secondary | ICD-10-CM

## 2019-05-06 NOTE — Research (Signed)
LATE ENTRY                                     PFO 2 YEAR PHONE CALL FOLLOW UP   Spoke to patient on 09-08-2018 for pfo 2 year phone call follow up. Patient doing well having no problems . Will call patient next year.      Zachary Woods  09/08/2018  11:00 a.m.

## 2019-08-04 ENCOUNTER — Encounter: Payer: Self-pay | Admitting: Family Medicine

## 2019-08-04 ENCOUNTER — Other Ambulatory Visit: Payer: Self-pay

## 2019-08-04 ENCOUNTER — Ambulatory Visit (INDEPENDENT_AMBULATORY_CARE_PROVIDER_SITE_OTHER): Payer: 59 | Admitting: Family Medicine

## 2019-08-04 VITALS — BP 142/84 | HR 110 | Temp 97.9°F | Ht 67.0 in | Wt 189.0 lb

## 2019-08-04 DIAGNOSIS — L237 Allergic contact dermatitis due to plants, except food: Secondary | ICD-10-CM

## 2019-08-04 DIAGNOSIS — R7303 Prediabetes: Secondary | ICD-10-CM

## 2019-08-04 DIAGNOSIS — Z1211 Encounter for screening for malignant neoplasm of colon: Secondary | ICD-10-CM | POA: Diagnosis not present

## 2019-08-04 DIAGNOSIS — I10 Essential (primary) hypertension: Secondary | ICD-10-CM | POA: Diagnosis not present

## 2019-08-04 DIAGNOSIS — E785 Hyperlipidemia, unspecified: Secondary | ICD-10-CM

## 2019-08-04 DIAGNOSIS — L03032 Cellulitis of left toe: Secondary | ICD-10-CM | POA: Diagnosis not present

## 2019-08-04 DIAGNOSIS — R739 Hyperglycemia, unspecified: Secondary | ICD-10-CM

## 2019-08-04 LAB — POCT CBC
Granulocyte percent: 75.3 %G (ref 37–80)
HCT, POC: 46.8 % — AB (ref 29–41)
Hemoglobin: 15.9 g/dL — AB (ref 11–14.6)
Lymph, poc: 2.2 (ref 0.6–3.4)
MCH, POC: 34.2 pg — AB (ref 27–31.2)
MCHC: 33.9 g/dL (ref 31.8–35.4)
MCV: 101 fL (ref 76–111)
MID (cbc): 0.4 (ref 0–0.9)
MPV: 6.9 fL (ref 0–99.8)
POC Granulocyte: 7.7 — AB (ref 2–6.9)
POC LYMPH PERCENT: 21.1 %L (ref 10–50)
POC MID %: 3.6 %M (ref 0–12)
Platelet Count, POC: 261 10*3/uL (ref 142–424)
RBC: 4.64 M/uL — AB (ref 4.69–6.13)
RDW, POC: 12.6 %
WBC: 10.2 10*3/uL (ref 4.6–10.2)

## 2019-08-04 LAB — GLUCOSE, POCT (MANUAL RESULT ENTRY): POC Glucose: 308 mg/dl — AB (ref 70–99)

## 2019-08-04 MED ORDER — TRIAMCINOLONE ACETONIDE 0.1 % EX CREA: 1.0000 "application " | TOPICAL_CREAM | Freq: Two times a day (BID) | CUTANEOUS | 0 refills | Status: DC

## 2019-08-04 MED ORDER — METFORMIN HCL 500 MG PO TABS: 500.0000 mg | ORAL_TABLET | Freq: Every day | ORAL | 0 refills | Status: AC

## 2019-08-04 NOTE — Progress Notes (Signed)
Subjective:  Patient ID: Zachary Woods, male    DOB: 1965/05/24  Age: 54 y.o. MRN: [id number]  CC:  Chief Complaint  Patient presents with  . possible infection    on pt's L foot pinkie toe. the to looks swollen and pr reports he has noticed some drainage. pt went to an urgent care on 08/01/2019. pt was given an antibiotic and told if it didn't get better to come see his PCP.  Marland Kitchen possible Poision ivy or oak    pt has bumps and rash on his L forearm. pt states it has spread to his L arm, back and R leg. pt states he went camping last weekend and that he might have came into contact with poison ivy or oak.    HPI NOE GOYER presents for concerns above.  Last visit with me in February 2020.  Left fifth toe swelling, discharge: initial soreness 8 days ago, slight swelling and red.  Went camping last weekend feet stayed wet. More red/swollen. urgent care May 31. Started on septra DS bid for 10 days. S/p 5 doses.  Looks a little better.less swollen, less drainage, no fever.    Possible poison ivy/oak: Went camping last weekend.  Possible contact with ivy or oak at that time.  Rash initially on R forearm 4 days ago. then spread to left arm past few days. Few on back, right leg past 2 days. Has spread all over in past.  No face or genital lesions. New dots popping up.   Attempted treatments: Calamine, benadryl spray.  No PUD. Prediabetes with last A1c 02/04/18. Glucose 120 in 01/2018. Lab Results  Component Value Date   HGBA1C 6.0 (H) 02/04/2018    Off hctz past few days - warning on septra rx. Still on amlodipine.  Plans on follow uo to review chronic meds. Fasting today.    History Patient Active Problem List   Diagnosis Date Noted  . PFO (patent foramen ovale)   . Cerebral embolism with cerebral infarction 07/20/2016  . Stroke determined by clinical assessment (HCC) 07/20/2016  . Hypertension 07/19/2016  . GERD (gastroesophageal reflux disease) 07/19/2016  . Malignant  hypertension 07/19/2016   Past Medical History:  Diagnosis Date  . Acid reflux   . CVA (cerebral vascular accident) (HCC)   . Hypertension    Past Surgical History:  Procedure Laterality Date  . NO PAST SURGERIES    . PATENT FORAMEN OVALE(PFO) CLOSURE N/A 08/01/2016   Procedure: Patent Forament [place](PFO) Closure;  Surgeon: Tonny Bollman, MD;  Location: [facility] INVASIVE CV LAB;  Service: Cardiovascular;  Laterality: N/A;  . TEE WITHOUT CARDIOVERSION N/A 07/21/2016   Procedure: TRANSESOPHAGEAL ECHOCARDIOGRAM (TEE);  Surgeon: Lars Masson, MD;  Location: [facility] ENDOSCOPY;  Service: Cardiovascular;  Laterality: N/A;   Allergies  Allergen Reactions  . Penicillins Itching    Has patient had a PCN reaction causing immediate rash, facial/tongue/throat swelling, SOB or lightheadedness with hypotension: No Has patient had a PCN reaction causing severe rash involving mucus membranes or skin necrosis: No Has patient had a PCN reaction that required hospitalization: No Has patient had a PCN reaction occurring within the last 10 years: Yes If all of the above answers are "NO", then may proceed with Cephalosporin use.   Prior to Admission medications   Medication Sig Start Date End Date Taking? Authorizing Provider  amLODipine (NORVASC) 10 MG tablet Take 1 tablet (10 mg total) by mouth daily. 02/04/18  Yes Shade Flood, MD  aspirin EC  81 MG tablet Take 1 tablet (81 mg total) by mouth daily. 08/05/17  Yes Eileen Stanford, PA-C  Ibuprofen-Famotidine (DUEXIS) 800-26.6 MG TABS Take by mouth.   Yes [provider]  meloxicam (MOBIC) 7.5 MG tablet Take 7.5 mg by mouth daily. 06/17/19  Yes [provider]  Multiple Vitamin (MULTIVITAMIN WITH MINERALS) TABS tablet Take 1 tablet by mouth daily.   Yes [provider]  pantoprazole (PROTONIX) 40 MG tablet TAKE 1 TABLET BY MOUTH EVERY DAY 03/25/17  Yes Sherren Mocha, MD  sildenafil (VIAGRA) 100 MG tablet Take 0.5-1 tablets (50-100  mg total) by mouth daily as needed for erectile dysfunction. 11/27/16  Yes Wendie Agreste, MD  sulfamethoxazole-trimethoprim (BACTRIM DS) 800-160 MG tablet Take 1 tablet by mouth 2 (two) times daily. 08/02/19  Yes [provider]  triamcinolone ointment (KENALOG) 0.5 % Apply 1 application topically 3 (three) times daily as needed. 08/18/18  Yes Wendie Agreste, MD  hydrochlorothiazide (MICROZIDE) 12.5 MG capsule TAKE 1 CAPSULE BY MOUTH EVERY DAY Patient not taking: Reported on 08/04/2019 06/23/18   Wendie Agreste, MD   Social History   Socioeconomic History  . Marital status: Single    Spouse name: Not on file  . Number of children: Not on file  . Years of education: Not on file  . Highest education level: Not on file  Occupational History  . Occupation: Dealer  Tobacco Use  . Smoking status: Former Research scientist (life sciences)  . Smokeless tobacco: Never Used  Substance and Sexual Activity  . Alcohol use: Yes    Alcohol/week: 1.0 standard drinks    Types: 1 Shots of liquor per week    Comment: 10-12/ week-- (2-4 drinks/ day)  . Drug use: No  . Sexual activity: Yes  Other Topics Concern  . Not on file  Social History Narrative  . Not on file   Social Determinants of Health   Financial Resource Strain:   . Difficulty of Paying Living Expenses:   Food Insecurity:   . Worried About Charity fundraiser in the Last Year:   . Arboriculturist in the Last Year:   Transportation Needs:   . Film/video editor (Medical):   Marland Kitchen Lack of Transportation (Non-Medical):   Physical Activity:   . Days of Exercise per Week:   . Minutes of Exercise per Session:   Stress:   . Feeling of Stress :   Social Connections:   . Frequency of Communication with Friends and Family:   . Frequency of Social Gatherings with Friends and Family:   . Attends Religious Services:   . Active Member of Clubs or Organizations:   . Attends Archivist Meetings:   Marland Kitchen Marital Status:   Intimate Partner  Violence:   . Fear of Current or Ex-Partner:   . Emotionally Abused:   Marland Kitchen Physically Abused:   . Sexually Abused:     Review of Systems  Constitutional: Negative for chills and fever.  Skin: Positive for color change and rash.    other per HPI.  Objective:   Vitals:   08/04/19 1605 08/04/19 1610  BP: (!) 145/96 (!) 142/84  Pulse: (!) 110   Temp: 97.9 F (36.6 C)   TempSrc: Temporal   SpO2: 96%   Weight: 189 lb (85.7 kg)   Height: 5\' 7"  (1.702 m)      Physical Exam Vitals reviewed.  Constitutional:      General: He is not in acute distress.  Appearance: He is well-developed.  HENT:     Head: Normocephalic and atraumatic.  Cardiovascular:     Rate and Rhythm: Normal rate.  Pulmonary:     Effort: Pulmonary effort is normal.  Skin:    Comments: Excoriated patch with overlying vesicles right forearm, left forearm with few erythematous papules.  No appreciable erythematous patches on lower legs, few small papules.  Left fifth toe with erythema, soft tissue swelling at the distal aspect with abscess at the proximal nail fold to the medial nail fold, no active discharge.  Tender to palpation  Neurological:     Mental Status: He is alert and oriented to person, place, and time.         Results for orders placed or performed in visit on 08/04/19  POCT glucose (manual entry)  Result Value Ref Range   POC Glucose 308 (A) 70 - 99 mg/dl  POCT CBC  Result Value Ref Range   WBC 10.2 4.6 - 10.2 K/uL   Lymph, poc 2.2 0.6 - 3.4   POC LYMPH PERCENT 21.1 10 - 50 %L   MID (cbc) 0.4 0 - 0.9   POC MID % 3.6 0 - 12 %M   POC Granulocyte 7.7 (A) 2 - 6.9   Granulocyte percent 75.3 37 - 80 %G   RBC 4.64 (A) 4.69 - 6.13 M/uL   Hemoglobin 15.9 (A) 11 - 14.6 g/dL   HCT, POC 78.9 (A) 29 - 41 %   MCV 101.0 76 - 111 fL   MCH, POC 34.2 (A) 27 - 31.2 pg   MCHC 33.9 31.8 - 35.4 g/dL   RDW, POC 38.1 %   Platelet Count, POC 261 142 - 424 K/uL   MPV 6.9 0 - 99.8 fL   Risks  (including but not limited to bleeding and infection), benefits, and alternatives discussed for I and d abscess of paronychia.  Verbal and written consent obtained after any questions were answered. Landmarks noted, and marked as needed. Area cleansed with Betadine x3, ethyl chloride spray for topical anesthesia, 11 blade proximal nail fold 80mm incision, small pus expressed, cultured,then small amt blood. No complications. Bandage applied.  RTC precautions discussed. Ebl<0.5cc.    Assessment & Plan:  YOBANY VROOM is a 54 y.o. male . Paronychia of fifth toe of left foot - Plan: POCT CBC, WOUND CULTURE  - improved erythema, but abscess formation. Incised as above without complications. Check culture. Home care discussed with repeat visit in 4 days.   Special screening for malignant neoplasms, colon - Plan: Ambulatory referral to Gastroenterology  Essential hypertension - Plan: Comprehensive metabolic panel  - elevated off HCTZ. Restart. Recheck in 4 days. Labs above.   Prediabetes - Plan: POCT glucose (manual entry), Hemoglobin A1c Hyperglycemia - Plan: metFORMIN (GLUCOPHAGE) 500 MG tablet  - prior prediabetes, now diabetic level with glucose 308. Stat metformin. a1c pending, review in 4 days. Asymptomatic.   Hyperlipidemia, unspecified hyperlipidemia type - Plan: Lipid panel  - discuss at follow up - fasting labs obtained, will likely recommend statin now at diabetes level.   Contact dermatitis due to poison ivy - Plan: triamcinolone cream (KENALOG) 0.1 %  - scattered lesions. Topical treatment with triamcinolone, calamine. rtc precautions.  Meds ordered this encounter  Medications  . triamcinolone cream (KENALOG) 0.1 %    Sig: Apply 1 application topically 2 (two) times daily.    Dispense:  30 g    Refill:  0  . metFORMIN (GLUCOPHAGE) 500 MG tablet  Sig: Take 1 tablet (500 mg total) by mouth daily with breakfast.    Dispense:  90 tablet    Refill:  0   Patient Instructions    Based on current locations of poison ivy, I think it is reasonable to start with topical triamcinolone cream 2-3 times per day to help with itching, continue calamine.  If you notice lesions in the face or genitals, or it continues to spread to multiple areas of the body return for recheck.  Infection in toe appears to be improving and should be better after procedure - recheck in 4 days. Continue antibiotic.   Start metformin once per day for blood sugar. Will discuss labs more next week.   Return to the clinic or go to the nearest emergency room if any of your symptoms worsen or new symptoms occur.    Paronychia Paronychia is an infection of the skin that surrounds a nail. It usually affects the skin around a fingernail, but it may also occur near a toenail. It often causes pain and swelling around the nail. In some cases, a collection of pus (abscess) can form near or under the nail.  This condition may develop suddenly, or it may develop gradually over a longer period. In most cases, paronychia is not serious, and it will clear up with treatment. What are the causes? This condition may be caused by bacteria or a fungus. These germs can enter the body through an opening in the skin, such as a cut or a hangnail. What increases the risk? This condition is more likely to develop in people who:  Get their hands wet often, such as those who work as Fish farm manager, bartenders, or nurses.  Bite their fingernails or suck their thumbs.  Trim their nails very short.  Have hangnails or injured fingertips.  Get manicures.  Have diabetes. What are the signs or symptoms? Symptoms of this condition include:  Redness and swelling of the skin near the nail.  Tenderness around the nail when you touch the area.  Pus-filled bumps under the skin at the base and sides of the nail (cuticle).  Fluid or pus under the nail.  Throbbing pain in the area. How is this diagnosed? This condition is  diagnosed with a physical exam. In some cases, a sample of pus may be tested to determine what type of bacteria or fungus is causing the condition. How is this treated? Treatment depends on the cause and severity of your condition. If your condition is mild, it may clear up on its own in a few days or after soaking in warm water. If needed, treatment may include:  Antibiotic medicine, if your infection is caused by bacteria.  Antifungal medicine, if your infection is caused by a fungus.  A procedure to drain pus from an abscess.  Anti-inflammatory medicine (corticosteroids). Follow these instructions at home: Wound care  Keep the affected area clean.  Soak the affected area in warm water, if told to do so by your health care provider. You may be told to do this for 20 minutes, 2-3 times a day.  Keep the area dry when you are not soaking it.  Do not try to drain an abscess yourself.  Follow instructions from your health care provider about how to take care of the affected area. Make sure you: ? Wash your hands with soap and water before you change your bandage (dressing). If soap and water are not available, use hand sanitizer. ? Change your dressing as  told by your health care provider.  If you had an abscess drained, check the area every day for signs of infection. Check for: ? Redness, swelling, or pain. ? Fluid or blood. ? Warmth. ? Pus or a bad smell. Medicines   Take over-the-counter and prescription medicines only as told by your health care provider.  If you were prescribed an antibiotic medicine, take it as told by your health care provider. Do not stop taking the antibiotic even if you start to feel better. General instructions  Avoid contact with harsh chemicals.  Do not pick at the affected area. Prevention  To prevent this condition from happening again: ? Wear rubber gloves when washing dishes or doing other tasks that require your hands to get wet. ? Wear  gloves if your hands might come in contact with cleaners or other chemicals. ? Avoid injuring your nails or fingertips. ? Do not bite your nails or tear hangnails. ? Do not cut your nails very short. ? Do not cut your cuticles. ? Use clean nail clippers or scissors when trimming nails. Contact a health care provider if:  Your symptoms get worse or do not improve with treatment.  You have continued or increased fluid, blood, or pus coming from the affected area.  Your finger or knuckle becomes swollen or difficult to move. Get help right away if you have:  A fever or chills.  Redness spreading away from the affected area.  Joint or muscle pain. Summary  Paronychia is an infection of the skin that surrounds a nail. It often causes pain and swelling around the nail. In some cases, a collection of pus (abscess) can form near or under the nail.  This condition may be caused by bacteria or a fungus. These germs can enter the body through an opening in the skin, such as a cut or a hangnail.  If your condition is mild, it may clear up on its own in a few days. If needed, treatment may include medicine or a procedure to drain pus from an abscess.  To prevent this condition from happening again, wear gloves if doing tasks that require your hands to get wet or to come in contact with chemicals. Also avoid injuring your nails or fingertips. This information is not intended to replace advice given to you by your health care provider. Make sure you discuss any questions you have with your health care provider. Document Revised: 03/06/2017 Document Reviewed: 03/02/2017 Elsevier Patient Education  [employer].     If you have lab work done today you will be contacted with your lab results within the next 2 weeks.  If you have not heard from [place] then please contact us. The fastest way to get your results is to register for My Chart.   IF you received an x-ray today, you will receive an  invoice from [facility]. Please contact [facility] at [phone] with questions or concerns regarding your invoice.   IF you received labwork today, you will receive an invoice from [facility]. Please contact [facility] at [phone] with questions or concerns regarding your invoice.   Our billing staff will not be able to assist you with questions regarding bills from these companies.  You will be contacted with the lab results as soon as they are available. The fastest way to get your results is to activate your My Chart account. Instructions are located on the last page of this paperwork. If you have not heard from [place] regarding  the results in 2 weeks, please contact this office.          Signed, Meredith StaggersJeffrey Yarexi Pawlicki, MD [facility]

## 2019-08-04 NOTE — Patient Instructions (Addendum)
Based on current locations of poison ivy, I think it is reasonable to start with topical triamcinolone cream 2-3 times per day to help with itching, continue calamine.  If you notice lesions in the face or genitals, or it continues to spread to multiple areas of the body return for recheck.  Infection in toe appears to be improving and should be better after procedure - recheck in 4 days. Continue antibiotic.   Start metformin once per day for blood sugar. Will discuss labs more next week.   Return to the clinic or go to the nearest emergency room if any of your symptoms worsen or new symptoms occur.    Paronychia Paronychia is an infection of the skin that surrounds a nail. It usually affects the skin around a fingernail, but it may also occur near a toenail. It often causes pain and swelling around the nail. In some cases, a collection of pus (abscess) can form near or under the nail.  This condition may develop suddenly, or it may develop gradually over a longer period. In most cases, paronychia is not serious, and it will clear up with treatment. What are the causes? This condition may be caused by bacteria or a fungus. These germs can enter the body through an opening in the skin, such as a cut or a hangnail. What increases the risk? This condition is more likely to develop in people who:  Get their hands wet often, such as those who work as Fish farm manager, bartenders, or nurses.  Bite their fingernails or suck their thumbs.  Trim their nails very short.  Have hangnails or injured fingertips.  Get manicures.  Have diabetes. What are the signs or symptoms? Symptoms of this condition include:  Redness and swelling of the skin near the nail.  Tenderness around the nail when you touch the area.  Pus-filled bumps under the skin at the base and sides of the nail (cuticle).  Fluid or pus under the nail.  Throbbing pain in the area. How is this diagnosed? This condition is  diagnosed with a physical exam. In some cases, a sample of pus may be tested to determine what type of bacteria or fungus is causing the condition. How is this treated? Treatment depends on the cause and severity of your condition. If your condition is mild, it may clear up on its own in a few days or after soaking in warm water. If needed, treatment may include:  Antibiotic medicine, if your infection is caused by bacteria.  Antifungal medicine, if your infection is caused by a fungus.  A procedure to drain pus from an abscess.  Anti-inflammatory medicine (corticosteroids). Follow these instructions at home: Wound care  Keep the affected area clean.  Soak the affected area in warm water, if told to do so by your health care provider. You may be told to do this for 20 minutes, 2-3 times a day.  Keep the area dry when you are not soaking it.  Do not try to drain an abscess yourself.  Follow instructions from your health care provider about how to take care of the affected area. Make sure you: ? Wash your hands with soap and water before you change your bandage (dressing). If soap and water are not available, use hand sanitizer. ? Change your dressing as told by your health care provider.  If you had an abscess drained, check the area every day for signs of infection. Check for: ? Redness, swelling, or pain. ? Fluid or  blood. ? Warmth. ? Pus or a bad smell. Medicines   Take over-the-counter and prescription medicines only as told by your health care provider.  If you were prescribed an antibiotic medicine, take it as told by your health care provider. Do not stop taking the antibiotic even if you start to feel better. General instructions  Avoid contact with harsh chemicals.  Do not pick at the affected area. Prevention  To prevent this condition from happening again: ? Wear rubber gloves when washing dishes or doing other tasks that require your hands to get wet. ? Wear  gloves if your hands might come in contact with cleaners or other chemicals. ? Avoid injuring your nails or fingertips. ? Do not bite your nails or tear hangnails. ? Do not cut your nails very short. ? Do not cut your cuticles. ? Use clean nail clippers or scissors when trimming nails. Contact a health care provider if:  Your symptoms get worse or do not improve with treatment.  You have continued or increased fluid, blood, or pus coming from the affected area.  Your finger or knuckle becomes swollen or difficult to move. Get help right away if you have:  A fever or chills.  Redness spreading away from the affected area.  Joint or muscle pain. Summary  Paronychia is an infection of the skin that surrounds a nail. It often causes pain and swelling around the nail. In some cases, a collection of pus (abscess) can form near or under the nail.  This condition may be caused by bacteria or a fungus. These germs can enter the body through an opening in the skin, such as a cut or a hangnail.  If your condition is mild, it may clear up on its own in a few days. If needed, treatment may include medicine or a procedure to drain pus from an abscess.  To prevent this condition from happening again, wear gloves if doing tasks that require your hands to get wet or to come in contact with chemicals. Also avoid injuring your nails or fingertips. This information is not intended to replace advice given to you by your health care provider. Make sure you discuss any questions you have with your health care provider. Document Revised: 03/06/2017 Document Reviewed: 03/02/2017 Elsevier Patient Education  [employer].     If you have lab work done today you will be contacted with your lab results within the next 2 weeks.  If you have not heard from [place] then please contact us. The fastest way to get your results is to register for My Chart.   IF you received an x-ray today, you will receive an  invoice from [facility]. Please contact [facility] at [phone] with questions or concerns regarding your invoice.   IF you received labwork today, you will receive an invoice from [facility]. Please contact [facility] at [phone] with questions or concerns regarding your invoice.   Our billing staff will not be able to assist you with questions regarding bills from these companies.  You will be contacted with the lab results as soon as they are available. The fastest way to get your results is to activate your My Chart account. Instructions are located on the last page of this paperwork. If you have not heard from [place] regarding the results in 2 weeks, please contact this office.

## 2019-08-05 LAB — SPECIMEN STATUS REPORT

## 2019-08-07 LAB — WOUND CULTURE

## 2019-08-08 ENCOUNTER — Ambulatory Visit: Payer: 59 | Admitting: Family Medicine

## 2019-08-08 LAB — COMPREHENSIVE METABOLIC PANEL
ALT: 63 IU/L — ABNORMAL HIGH (ref 0–44)
AST: 61 IU/L — ABNORMAL HIGH (ref 0–40)
Albumin/Globulin Ratio: 1.8 (ref 1.2–2.2)
Albumin: 4.8 g/dL (ref 3.8–4.9)
Alkaline Phosphatase: 92 IU/L (ref 48–121)
BUN/Creatinine Ratio: 7 — ABNORMAL LOW (ref 9–20)
BUN: 8 mg/dL (ref 6–24)
Bilirubin Total: 0.6 mg/dL (ref 0.0–1.2)
CO2: 22 mmol/L (ref 20–29)
Calcium: 9.7 mg/dL (ref 8.7–10.2)
Chloride: 93 mmol/L — ABNORMAL LOW (ref 96–106)
Creatinine, Ser: 1.07 mg/dL (ref 0.76–1.27)
GFR calc Af Amer: 91 mL/min/{1.73_m2} (ref >59)
GFR calc non Af Amer: 79 mL/min/{1.73_m2} (ref >59)
Globulin, Total: 2.6 g/dL (ref 1.5–4.5)
Glucose: 323 mg/dL — ABNORMAL HIGH (ref 65–99)
Potassium: 4.4 mmol/L (ref 3.5–5.2)
Sodium: 136 mmol/L (ref 134–144)
Total Protein: 7.4 g/dL (ref 6.0–8.5)

## 2019-08-08 LAB — LIPID PANEL
Chol/HDL Ratio: 4.3 ratio (ref 0.0–5.0)
Cholesterol, Total: 212 mg/dL — ABNORMAL HIGH (ref 100–199)
HDL: 49 mg/dL (ref >39)
LDL Chol Calc (NIH): 124 mg/dL — ABNORMAL HIGH (ref 0–99)
Triglycerides: 219 mg/dL — ABNORMAL HIGH (ref 0–149)
VLDL Cholesterol Cal: 39 mg/dL (ref 5–40)

## 2019-08-08 LAB — HEMOGLOBIN A1C
Est. average glucose Bld gHb Est-mCnc: 189 mg/dL
Hgb A1c MFr Bld: 8.2 % — ABNORMAL HIGH (ref 4.8–5.6)

## 2019-08-09 ENCOUNTER — Encounter: Payer: Self-pay | Admitting: Family Medicine

## 2019-08-17 NOTE — Progress Notes (Signed)
Noted PT saw Lab notes via My chart. Attempted to call pt no answer.

## 2019-08-26 ENCOUNTER — Other Ambulatory Visit: Payer: Self-pay | Admitting: Family Medicine

## 2019-08-26 DIAGNOSIS — R739 Hyperglycemia, unspecified: Secondary | ICD-10-CM

## 2019-10-27 ENCOUNTER — Encounter: Payer: Self-pay | Admitting: *Deleted

## 2019-10-27 DIAGNOSIS — Z006 Encounter for examination for normal comparison and control in clinical research program: Secondary | ICD-10-CM

## 2019-12-09 NOTE — Research (Signed)
3 year phone call follow up for PFO study. Patient not having any problems.     Zachary Woods    10/27/2019

## 2020-04-20 ENCOUNTER — Inpatient Hospital Stay (HOSPITAL_COMMUNITY): Payer: 59

## 2020-04-20 ENCOUNTER — Emergency Department (HOSPITAL_COMMUNITY): Payer: 59

## 2020-04-20 ENCOUNTER — Inpatient Hospital Stay (HOSPITAL_COMMUNITY)
Admission: EM | Admit: 2020-04-20 | Discharge: 2020-05-01 | DRG: 870 | Disposition: E | Payer: 59 | Attending: Pulmonary Disease | Admitting: Pulmonary Disease

## 2020-04-20 ENCOUNTER — Encounter (HOSPITAL_COMMUNITY): Payer: Self-pay | Admitting: Internal Medicine

## 2020-04-20 DIAGNOSIS — R748 Abnormal levels of other serum enzymes: Secondary | ICD-10-CM | POA: Diagnosis present

## 2020-04-20 DIAGNOSIS — I639 Cerebral infarction, unspecified: Secondary | ICD-10-CM | POA: Diagnosis present

## 2020-04-20 DIAGNOSIS — R34 Anuria and oliguria: Secondary | ICD-10-CM | POA: Diagnosis not present

## 2020-04-20 DIAGNOSIS — R000 Tachycardia, unspecified: Secondary | ICD-10-CM | POA: Diagnosis not present

## 2020-04-20 DIAGNOSIS — E1100 Type 2 diabetes mellitus with hyperosmolarity without nonketotic hyperglycemic-hyperosmolar coma (NKHHC): Secondary | ICD-10-CM | POA: Diagnosis present

## 2020-04-20 DIAGNOSIS — E860 Dehydration: Secondary | ICD-10-CM | POA: Diagnosis present

## 2020-04-20 DIAGNOSIS — R4701 Aphasia: Secondary | ICD-10-CM | POA: Diagnosis present

## 2020-04-20 DIAGNOSIS — I6389 Other cerebral infarction: Secondary | ICD-10-CM

## 2020-04-20 DIAGNOSIS — R7989 Other specified abnormal findings of blood chemistry: Secondary | ICD-10-CM | POA: Diagnosis not present

## 2020-04-20 DIAGNOSIS — Z8673 Personal history of transient ischemic attack (TIA), and cerebral infarction without residual deficits: Secondary | ICD-10-CM

## 2020-04-20 DIAGNOSIS — Z7982 Long term (current) use of aspirin: Secondary | ICD-10-CM

## 2020-04-20 DIAGNOSIS — R7881 Bacteremia: Secondary | ICD-10-CM | POA: Diagnosis not present

## 2020-04-20 DIAGNOSIS — E872 Acidosis, unspecified: Secondary | ICD-10-CM | POA: Diagnosis present

## 2020-04-20 DIAGNOSIS — I63312 Cerebral infarction due to thrombosis of left middle cerebral artery: Secondary | ICD-10-CM | POA: Diagnosis not present

## 2020-04-20 DIAGNOSIS — N280 Ischemia and infarction of kidney: Secondary | ICD-10-CM | POA: Diagnosis not present

## 2020-04-20 DIAGNOSIS — R29810 Facial weakness: Secondary | ICD-10-CM | POA: Diagnosis present

## 2020-04-20 DIAGNOSIS — J9601 Acute respiratory failure with hypoxia: Secondary | ICD-10-CM | POA: Diagnosis not present

## 2020-04-20 DIAGNOSIS — R569 Unspecified convulsions: Secondary | ICD-10-CM

## 2020-04-20 DIAGNOSIS — I6340 Cerebral infarction due to embolism of unspecified cerebral artery: Secondary | ICD-10-CM | POA: Diagnosis not present

## 2020-04-20 DIAGNOSIS — K219 Gastro-esophageal reflux disease without esophagitis: Secondary | ICD-10-CM | POA: Diagnosis present

## 2020-04-20 DIAGNOSIS — I10 Essential (primary) hypertension: Secondary | ICD-10-CM | POA: Diagnosis present

## 2020-04-20 DIAGNOSIS — I729 Aneurysm of unspecified site: Secondary | ICD-10-CM | POA: Diagnosis present

## 2020-04-20 DIAGNOSIS — Z79899 Other long term (current) drug therapy: Secondary | ICD-10-CM

## 2020-04-20 DIAGNOSIS — N179 Acute kidney failure, unspecified: Secondary | ICD-10-CM | POA: Diagnosis not present

## 2020-04-20 DIAGNOSIS — A4101 Sepsis due to Methicillin susceptible Staphylococcus aureus: Secondary | ICD-10-CM | POA: Diagnosis present

## 2020-04-20 DIAGNOSIS — G8321 Monoplegia of upper limb affecting right dominant side: Secondary | ICD-10-CM | POA: Diagnosis present

## 2020-04-20 DIAGNOSIS — I4891 Unspecified atrial fibrillation: Secondary | ICD-10-CM | POA: Diagnosis not present

## 2020-04-20 DIAGNOSIS — J9600 Acute respiratory failure, unspecified whether with hypoxia or hypercapnia: Secondary | ICD-10-CM | POA: Diagnosis not present

## 2020-04-20 DIAGNOSIS — I471 Supraventricular tachycardia: Secondary | ICD-10-CM | POA: Diagnosis not present

## 2020-04-20 DIAGNOSIS — I330 Acute and subacute infective endocarditis: Secondary | ICD-10-CM | POA: Diagnosis present

## 2020-04-20 DIAGNOSIS — Q2112 Patent foramen ovale: Secondary | ICD-10-CM

## 2020-04-20 DIAGNOSIS — G9341 Metabolic encephalopathy: Secondary | ICD-10-CM | POA: Diagnosis present

## 2020-04-20 DIAGNOSIS — Z880 Allergy status to penicillin: Secondary | ICD-10-CM

## 2020-04-20 DIAGNOSIS — I611 Nontraumatic intracerebral hemorrhage in hemisphere, cortical: Secondary | ICD-10-CM | POA: Diagnosis present

## 2020-04-20 DIAGNOSIS — Z0189 Encounter for other specified special examinations: Secondary | ICD-10-CM

## 2020-04-20 DIAGNOSIS — I959 Hypotension, unspecified: Secondary | ICD-10-CM | POA: Diagnosis not present

## 2020-04-20 DIAGNOSIS — R739 Hyperglycemia, unspecified: Secondary | ICD-10-CM | POA: Diagnosis not present

## 2020-04-20 DIAGNOSIS — G009 Bacterial meningitis, unspecified: Secondary | ICD-10-CM | POA: Diagnosis not present

## 2020-04-20 DIAGNOSIS — Z8774 Personal history of (corrected) congenital malformations of heart and circulatory system: Secondary | ICD-10-CM

## 2020-04-20 DIAGNOSIS — E11649 Type 2 diabetes mellitus with hypoglycemia without coma: Secondary | ICD-10-CM | POA: Diagnosis not present

## 2020-04-20 DIAGNOSIS — E8809 Other disorders of plasma-protein metabolism, not elsewhere classified: Secondary | ICD-10-CM | POA: Diagnosis not present

## 2020-04-20 DIAGNOSIS — G003 Staphylococcal meningitis: Secondary | ICD-10-CM | POA: Diagnosis present

## 2020-04-20 DIAGNOSIS — R4781 Slurred speech: Secondary | ICD-10-CM

## 2020-04-20 DIAGNOSIS — R6521 Severe sepsis with septic shock: Secondary | ICD-10-CM | POA: Diagnosis present

## 2020-04-20 DIAGNOSIS — Z515 Encounter for palliative care: Secondary | ICD-10-CM

## 2020-04-20 DIAGNOSIS — G4089 Other seizures: Secondary | ICD-10-CM | POA: Diagnosis not present

## 2020-04-20 DIAGNOSIS — E871 Hypo-osmolality and hyponatremia: Secondary | ICD-10-CM | POA: Diagnosis present

## 2020-04-20 DIAGNOSIS — D509 Iron deficiency anemia, unspecified: Secondary | ICD-10-CM | POA: Diagnosis present

## 2020-04-20 DIAGNOSIS — R531 Weakness: Secondary | ICD-10-CM

## 2020-04-20 DIAGNOSIS — N19 Unspecified kidney failure: Secondary | ICD-10-CM

## 2020-04-20 DIAGNOSIS — J690 Pneumonitis due to inhalation of food and vomit: Secondary | ICD-10-CM

## 2020-04-20 DIAGNOSIS — R6510 Systemic inflammatory response syndrome (SIRS) of non-infectious origin without acute organ dysfunction: Secondary | ICD-10-CM | POA: Diagnosis not present

## 2020-04-20 DIAGNOSIS — Z833 Family history of diabetes mellitus: Secondary | ICD-10-CM

## 2020-04-20 DIAGNOSIS — R29723 NIHSS score 23: Secondary | ICD-10-CM | POA: Diagnosis present

## 2020-04-20 DIAGNOSIS — D6861 Antiphospholipid syndrome: Secondary | ICD-10-CM | POA: Diagnosis present

## 2020-04-20 DIAGNOSIS — D696 Thrombocytopenia, unspecified: Secondary | ICD-10-CM | POA: Diagnosis not present

## 2020-04-20 DIAGNOSIS — K7469 Other cirrhosis of liver: Secondary | ICD-10-CM | POA: Diagnosis not present

## 2020-04-20 DIAGNOSIS — Z66 Do not resuscitate: Secondary | ICD-10-CM | POA: Diagnosis not present

## 2020-04-20 DIAGNOSIS — J9690 Respiratory failure, unspecified, unspecified whether with hypoxia or hypercapnia: Secondary | ICD-10-CM

## 2020-04-20 DIAGNOSIS — E870 Hyperosmolality and hypernatremia: Secondary | ICD-10-CM | POA: Diagnosis not present

## 2020-04-20 DIAGNOSIS — E878 Other disorders of electrolyte and fluid balance, not elsewhere classified: Secondary | ICD-10-CM | POA: Diagnosis present

## 2020-04-20 DIAGNOSIS — I369 Nonrheumatic tricuspid valve disorder, unspecified: Secondary | ICD-10-CM | POA: Diagnosis not present

## 2020-04-20 DIAGNOSIS — E875 Hyperkalemia: Secondary | ICD-10-CM | POA: Diagnosis not present

## 2020-04-20 DIAGNOSIS — H53461 Homonymous bilateral field defects, right side: Secondary | ICD-10-CM | POA: Diagnosis present

## 2020-04-20 DIAGNOSIS — Q211 Atrial septal defect: Secondary | ICD-10-CM

## 2020-04-20 DIAGNOSIS — Z20822 Contact with and (suspected) exposure to covid-19: Secondary | ICD-10-CM | POA: Diagnosis present

## 2020-04-20 DIAGNOSIS — G40901 Epilepsy, unspecified, not intractable, with status epilepticus: Secondary | ICD-10-CM | POA: Diagnosis not present

## 2020-04-20 DIAGNOSIS — I63512 Cerebral infarction due to unspecified occlusion or stenosis of left middle cerebral artery: Secondary | ICD-10-CM | POA: Diagnosis not present

## 2020-04-20 DIAGNOSIS — Z978 Presence of other specified devices: Secondary | ICD-10-CM

## 2020-04-20 DIAGNOSIS — K5900 Constipation, unspecified: Secondary | ICD-10-CM | POA: Diagnosis not present

## 2020-04-20 DIAGNOSIS — R0989 Other specified symptoms and signs involving the circulatory and respiratory systems: Secondary | ICD-10-CM | POA: Diagnosis not present

## 2020-04-20 DIAGNOSIS — Z452 Encounter for adjustment and management of vascular access device: Secondary | ICD-10-CM

## 2020-04-20 DIAGNOSIS — I609 Nontraumatic subarachnoid hemorrhage, unspecified: Secondary | ICD-10-CM

## 2020-04-20 DIAGNOSIS — G8191 Hemiplegia, unspecified affecting right dominant side: Secondary | ICD-10-CM | POA: Diagnosis present

## 2020-04-20 DIAGNOSIS — E876 Hypokalemia: Secondary | ICD-10-CM | POA: Diagnosis not present

## 2020-04-20 DIAGNOSIS — Z87891 Personal history of nicotine dependence: Secondary | ICD-10-CM

## 2020-04-20 DIAGNOSIS — N170 Acute kidney failure with tubular necrosis: Secondary | ICD-10-CM | POA: Diagnosis present

## 2020-04-20 DIAGNOSIS — Z791 Long term (current) use of non-steroidal anti-inflammatories (NSAID): Secondary | ICD-10-CM

## 2020-04-20 DIAGNOSIS — I76 Septic arterial embolism: Secondary | ICD-10-CM | POA: Diagnosis present

## 2020-04-20 DIAGNOSIS — B9561 Methicillin susceptible Staphylococcus aureus infection as the cause of diseases classified elsewhere: Secondary | ICD-10-CM

## 2020-04-20 DIAGNOSIS — K7460 Unspecified cirrhosis of liver: Secondary | ICD-10-CM | POA: Diagnosis present

## 2020-04-20 DIAGNOSIS — E785 Hyperlipidemia, unspecified: Secondary | ICD-10-CM | POA: Diagnosis present

## 2020-04-20 DIAGNOSIS — L0291 Cutaneous abscess, unspecified: Secondary | ICD-10-CM

## 2020-04-20 DIAGNOSIS — I63412 Cerebral infarction due to embolism of left middle cerebral artery: Secondary | ICD-10-CM | POA: Diagnosis present

## 2020-04-20 DIAGNOSIS — N390 Urinary tract infection, site not specified: Secondary | ICD-10-CM | POA: Diagnosis present

## 2020-04-20 DIAGNOSIS — B9562 Methicillin resistant Staphylococcus aureus infection as the cause of diseases classified elsewhere: Secondary | ICD-10-CM | POA: Diagnosis not present

## 2020-04-20 DIAGNOSIS — Z794 Long term (current) use of insulin: Secondary | ICD-10-CM | POA: Diagnosis not present

## 2020-04-20 DIAGNOSIS — I6310 Cerebral infarction due to embolism of unspecified precerebral artery: Secondary | ICD-10-CM | POA: Diagnosis not present

## 2020-04-20 DIAGNOSIS — E1165 Type 2 diabetes mellitus with hyperglycemia: Secondary | ICD-10-CM | POA: Diagnosis present

## 2020-04-20 DIAGNOSIS — R197 Diarrhea, unspecified: Secondary | ICD-10-CM | POA: Diagnosis present

## 2020-04-20 DIAGNOSIS — R1310 Dysphagia, unspecified: Secondary | ICD-10-CM | POA: Diagnosis present

## 2020-04-20 DIAGNOSIS — Z9689 Presence of other specified functional implants: Secondary | ICD-10-CM | POA: Diagnosis not present

## 2020-04-20 DIAGNOSIS — A419 Sepsis, unspecified organism: Secondary | ICD-10-CM | POA: Diagnosis not present

## 2020-04-20 DIAGNOSIS — Z7984 Long term (current) use of oral hypoglycemic drugs: Secondary | ICD-10-CM

## 2020-04-20 DIAGNOSIS — I6522 Occlusion and stenosis of left carotid artery: Secondary | ICD-10-CM | POA: Diagnosis present

## 2020-04-20 HISTORY — DX: Type 2 diabetes mellitus without complications: E11.9

## 2020-04-20 LAB — RESP PANEL BY RT-PCR (FLU A&B, COVID) ARPGX2
Influenza A by PCR: NEGATIVE
Influenza B by PCR: NEGATIVE
SARS Coronavirus 2 by RT PCR: NEGATIVE

## 2020-04-20 LAB — BASIC METABOLIC PANEL
Anion gap: 16 — ABNORMAL HIGH (ref 5–15)
Anion gap: 12 (ref 5–15)
BUN: 23 mg/dL — ABNORMAL HIGH (ref 6–20)
BUN: 20 mg/dL (ref 6–20)
CO2: 16 mmol/L — ABNORMAL LOW (ref 22–32)
CO2: 18 mmol/L — ABNORMAL LOW (ref 22–32)
Calcium: 8 mg/dL — ABNORMAL LOW (ref 8.9–10.3)
Calcium: 7.6 mg/dL — ABNORMAL LOW (ref 8.9–10.3)
Chloride: 94 mmol/L — ABNORMAL LOW (ref 98–111)
Chloride: 103 mmol/L (ref 98–111)
Creatinine, Ser: 1.53 mg/dL — ABNORMAL HIGH (ref 0.61–1.24)
Creatinine, Ser: 1.23 mg/dL (ref 0.61–1.24)
GFR, Estimated: 54 mL/min — ABNORMAL LOW (ref >60)
GFR, Estimated: >60 mL/min (ref >60)
Glucose, Bld: 416 mg/dL — ABNORMAL HIGH (ref 70–99)
Glucose, Bld: 253 mg/dL — ABNORMAL HIGH (ref 70–99)
Potassium: 2.9 mmol/L — ABNORMAL LOW (ref 3.5–5.1)
Potassium: 2.8 mmol/L — ABNORMAL LOW (ref 3.5–5.1)
Sodium: 126 mmol/L — ABNORMAL LOW (ref 135–145)
Sodium: 133 mmol/L — ABNORMAL LOW (ref 135–145)

## 2020-04-20 LAB — URINALYSIS, ROUTINE W REFLEX MICROSCOPIC
Bacteria, UA: NONE SEEN
Bilirubin Urine: NEGATIVE
Glucose, UA: >=500 mg/dL — AB
Ketones, ur: 5 mg/dL — AB
Nitrite: POSITIVE — AB
Protein, ur: 30 mg/dL — AB
Specific Gravity, Urine: 1.035 — ABNORMAL HIGH (ref 1.005–1.030)
pH: 6 (ref 5.0–8.0)

## 2020-04-20 LAB — CBC WITH DIFFERENTIAL/PLATELET
Abs Immature Granulocytes: 0.17 10*3/uL — ABNORMAL HIGH (ref 0.00–0.07)
Basophils Absolute: 0.1 10*3/uL (ref 0.0–0.1)
Basophils Relative: 1 %
Eosinophils Absolute: 0 10*3/uL (ref 0.0–0.5)
Eosinophils Relative: 0 %
HCT: 37.9 % — ABNORMAL LOW (ref 39.0–52.0)
Hemoglobin: 13.5 g/dL (ref 13.0–17.0)
Immature Granulocytes: 1 %
Lymphocytes Relative: 3 %
Lymphs Abs: 0.4 10*3/uL — ABNORMAL LOW (ref 0.7–4.0)
MCH: 33.5 pg (ref 26.0–34.0)
MCHC: 35.6 g/dL (ref 30.0–36.0)
MCV: 94 fL (ref 80.0–100.0)
Monocytes Absolute: 0.4 10*3/uL (ref 0.1–1.0)
Monocytes Relative: 3 %
Neutro Abs: 11 10*3/uL — ABNORMAL HIGH (ref 1.7–7.7)
Neutrophils Relative %: 92 %
Platelets: 62 10*3/uL — ABNORMAL LOW (ref 150–400)
RBC: 4.03 MIL/uL — ABNORMAL LOW (ref 4.22–5.81)
RDW: 11.5 % (ref 11.5–15.5)
WBC: 12 10*3/uL — ABNORMAL HIGH (ref 4.0–10.5)
nRBC: 0 % (ref 0.0–0.2)

## 2020-04-20 LAB — MAGNESIUM: Magnesium: 1.6 mg/dL — ABNORMAL LOW (ref 1.7–2.4)

## 2020-04-20 LAB — COMPREHENSIVE METABOLIC PANEL
ALT: 23 U/L (ref 0–44)
AST: 40 U/L (ref 15–41)
Albumin: 2.8 g/dL — ABNORMAL LOW (ref 3.5–5.0)
Alkaline Phosphatase: 127 U/L — ABNORMAL HIGH (ref 38–126)
Anion gap: 16 — ABNORMAL HIGH (ref 5–15)
BUN: 27 mg/dL — ABNORMAL HIGH (ref 6–20)
CO2: 19 mmol/L — ABNORMAL LOW (ref 22–32)
Calcium: 8.3 mg/dL — ABNORMAL LOW (ref 8.9–10.3)
Chloride: 89 mmol/L — ABNORMAL LOW (ref 98–111)
Creatinine, Ser: 1.55 mg/dL — ABNORMAL HIGH (ref 0.61–1.24)
GFR, Estimated: 53 mL/min — ABNORMAL LOW (ref >60)
Glucose, Bld: 512 mg/dL (ref 70–99)
Potassium: 2.8 mmol/L — ABNORMAL LOW (ref 3.5–5.1)
Sodium: 124 mmol/L — ABNORMAL LOW (ref 135–145)
Total Bilirubin: 1.7 mg/dL — ABNORMAL HIGH (ref 0.3–1.2)
Total Protein: 6.2 g/dL — ABNORMAL LOW (ref 6.5–8.1)

## 2020-04-20 LAB — I-STAT ARTERIAL BLOOD GAS, ED
Acid-base deficit: 5 mmol/L — ABNORMAL HIGH (ref 0.0–2.0)
Bicarbonate: 17.7 mmol/L — ABNORMAL LOW (ref 20.0–28.0)
Calcium, Ion: 1.13 mmol/L — ABNORMAL LOW (ref 1.15–1.40)
HCT: 36 % — ABNORMAL LOW (ref 39.0–52.0)
Hemoglobin: 12.2 g/dL — ABNORMAL LOW (ref 13.0–17.0)
O2 Saturation: 95 %
Patient temperature: 100.7
Potassium: 2.7 mmol/L — CL (ref 3.5–5.1)
Sodium: 135 mmol/L (ref 135–145)
TCO2: 19 mmol/L — ABNORMAL LOW (ref 22–32)
pCO2 arterial: 28.2 mmHg — ABNORMAL LOW (ref 32.0–48.0)
pH, Arterial: 7.411 (ref 7.350–7.450)
pO2, Arterial: 77 mmHg — ABNORMAL LOW (ref 83.0–108.0)

## 2020-04-20 LAB — I-STAT CHEM 8, ED
BUN: 27 mg/dL — ABNORMAL HIGH (ref 6–20)
Calcium, Ion: 1.03 mmol/L — ABNORMAL LOW (ref 1.15–1.40)
Chloride: 88 mmol/L — ABNORMAL LOW (ref 98–111)
Creatinine, Ser: 1.2 mg/dL (ref 0.61–1.24)
Glucose, Bld: 522 mg/dL (ref 70–99)
HCT: 46 % (ref 39.0–52.0)
Hemoglobin: 15.6 g/dL (ref 13.0–17.0)
Potassium: 2.8 mmol/L — ABNORMAL LOW (ref 3.5–5.1)
Sodium: 126 mmol/L — ABNORMAL LOW (ref 135–145)
TCO2: 19 mmol/L — ABNORMAL LOW (ref 22–32)

## 2020-04-20 LAB — DIFFERENTIAL
Abs Immature Granulocytes: 0.25 10*3/uL — ABNORMAL HIGH (ref 0.00–0.07)
Basophils Absolute: 0 10*3/uL (ref 0.0–0.1)
Basophils Relative: 0 %
Eosinophils Absolute: 0 10*3/uL (ref 0.0–0.5)
Eosinophils Relative: 0 %
Immature Granulocytes: 4 %
Lymphocytes Relative: 3 %
Lymphs Abs: 0.2 10*3/uL — ABNORMAL LOW (ref 0.7–4.0)
Monocytes Absolute: 0.1 10*3/uL (ref 0.1–1.0)
Monocytes Relative: 2 %
Neutro Abs: 6.1 10*3/uL (ref 1.7–7.7)
Neutrophils Relative %: 91 %

## 2020-04-20 LAB — CBG MONITORING, ED
Glucose-Capillary: 466 mg/dL — ABNORMAL HIGH (ref 70–99)
Glucose-Capillary: 413 mg/dL — ABNORMAL HIGH (ref 70–99)
Glucose-Capillary: 375 mg/dL — ABNORMAL HIGH (ref 70–99)
Glucose-Capillary: 225 mg/dL — ABNORMAL HIGH (ref 70–99)
Glucose-Capillary: 204 mg/dL — ABNORMAL HIGH (ref 70–99)

## 2020-04-20 LAB — LIPID PANEL
Cholesterol: 98 mg/dL (ref 0–200)
HDL: <10 mg/dL — ABNORMAL LOW (ref >40)
Triglycerides: 327 mg/dL — ABNORMAL HIGH (ref <150)
VLDL: 65 mg/dL — ABNORMAL HIGH (ref 0–40)

## 2020-04-20 LAB — OSMOLALITY: Osmolality: 290 mOsm/kg (ref 275–295)

## 2020-04-20 LAB — LDL CHOLESTEROL, DIRECT: Direct LDL: 15.3 mg/dL (ref 0–99)

## 2020-04-20 LAB — CBC
HCT: 42 % (ref 39.0–52.0)
Hemoglobin: 15.4 g/dL (ref 13.0–17.0)
MCH: 34.3 pg — ABNORMAL HIGH (ref 26.0–34.0)
MCHC: 36.7 g/dL — ABNORMAL HIGH (ref 30.0–36.0)
MCV: 93.5 fL (ref 80.0–100.0)
Platelets: 74 10*3/uL — ABNORMAL LOW (ref 150–400)
RBC: 4.49 MIL/uL (ref 4.22–5.81)
RDW: 11.3 % — ABNORMAL LOW (ref 11.5–15.5)
WBC: 6.7 10*3/uL (ref 4.0–10.5)
nRBC: 0 % (ref 0.0–0.2)

## 2020-04-20 LAB — HEPATITIS PANEL, ACUTE
HCV Ab: NONREACTIVE
Hep A IgM: NONREACTIVE
Hep B C IgM: NONREACTIVE
Hepatitis B Surface Ag: NONREACTIVE

## 2020-04-20 LAB — HEMOGLOBIN A1C
Hgb A1c MFr Bld: 10.5 % — ABNORMAL HIGH (ref 4.8–5.6)
Mean Plasma Glucose: 254.65 mg/dL

## 2020-04-20 LAB — PROCALCITONIN: Procalcitonin: 34.38 ng/mL

## 2020-04-20 LAB — PROTIME-INR
INR: 1.3 — ABNORMAL HIGH (ref 0.8–1.2)
Prothrombin Time: 15.9 seconds — ABNORMAL HIGH (ref 11.4–15.2)

## 2020-04-20 LAB — RAPID URINE DRUG SCREEN, HOSP PERFORMED
Amphetamines: NOT DETECTED
Barbiturates: NOT DETECTED
Benzodiazepines: NOT DETECTED
Cocaine: NOT DETECTED
Opiates: POSITIVE — AB
Tetrahydrocannabinol: NOT DETECTED

## 2020-04-20 LAB — APTT: aPTT: 33 seconds (ref 24–36)

## 2020-04-20 LAB — HIV ANTIBODY (ROUTINE TESTING W REFLEX): HIV Screen 4th Generation wRfx: NONREACTIVE

## 2020-04-20 LAB — ECHOCARDIOGRAM COMPLETE
S' Lateral: 4.4 cm
Weight: 2744.29 oz

## 2020-04-20 LAB — GLUCOSE, CAPILLARY
Glucose-Capillary: 151 mg/dL — ABNORMAL HIGH (ref 70–99)
Glucose-Capillary: 134 mg/dL — ABNORMAL HIGH (ref 70–99)

## 2020-04-20 LAB — LACTIC ACID, PLASMA
Lactic Acid, Venous: 7.4 mmol/L (ref 0.5–1.9)
Lactic Acid, Venous: 5.8 mmol/L (ref 0.5–1.9)
Lactic Acid, Venous: 4.5 mmol/L (ref 0.5–1.9)

## 2020-04-20 LAB — MRSA PCR SCREENING: MRSA by PCR: NEGATIVE

## 2020-04-20 MED ORDER — SODIUM CHLORIDE 0.9 % IV SOLN: INTRAVENOUS | Status: DC

## 2020-04-20 MED ORDER — ACETAMINOPHEN 325 MG PO TABS: 650.0000 mg | ORAL_TABLET | ORAL | Status: DC | PRN

## 2020-04-20 MED ORDER — VANCOMYCIN HCL 1500 MG/300ML IV SOLN
1500.0000 mg | Freq: Once | INTRAVENOUS | Status: AC
  Administered 2020-04-20: 1500 mg via INTRAVENOUS
  Filled 2020-04-20: qty 300

## 2020-04-20 MED ORDER — IOHEXOL 350 MG/ML SOLN
100.0000 mL | Freq: Once | INTRAVENOUS | Status: AC | PRN
  Administered 2020-04-20: 100 mL via INTRAVENOUS

## 2020-04-20 MED ORDER — SODIUM CHLORIDE 0.9% FLUSH: 3.0000 mL | Freq: Once | INTRAVENOUS | Status: DC

## 2020-04-20 MED ORDER — STROKE: EARLY STAGES OF RECOVERY BOOK
Freq: Once | Status: AC
  Filled 2020-04-20: qty 1

## 2020-04-20 MED ORDER — DEXTROSE IN LACTATED RINGERS 5 % IV SOLN: INTRAVENOUS | Status: DC

## 2020-04-20 MED ORDER — MORPHINE SULFATE (PF) 4 MG/ML IV SOLN
4.0000 mg | Freq: Once | INTRAVENOUS | Status: AC
  Administered 2020-04-20: 4 mg via INTRAVENOUS
  Filled 2020-04-20: qty 1

## 2020-04-20 MED ORDER — POTASSIUM CHLORIDE IN NACL 20-0.9 MEQ/L-% IV SOLN
INTRAVENOUS | Status: DC
  Filled 2020-04-20 (×4): qty 1000

## 2020-04-20 MED ORDER — CHLORHEXIDINE GLUCONATE 0.12 % MT SOLN
15.0000 mL | Freq: Two times a day (BID) | OROMUCOSAL | Status: DC
  Administered 2020-04-20 – 2020-04-22 (×4): 15 mL via OROMUCOSAL
  Filled 2020-04-20: qty 15

## 2020-04-20 MED ORDER — DOCUSATE SODIUM 100 MG PO CAPS: 100.0000 mg | ORAL_CAPSULE | Freq: Two times a day (BID) | ORAL | Status: DC | PRN

## 2020-04-20 MED ORDER — POTASSIUM CHLORIDE 10 MEQ/100ML IV SOLN
INTRAVENOUS | Status: AC
  Administered 2020-04-20: 10 meq via INTRAVENOUS
  Filled 2020-04-20: qty 100

## 2020-04-20 MED ORDER — CHLORHEXIDINE GLUCONATE CLOTH 2 % EX PADS
6.0000 | MEDICATED_PAD | Freq: Every day | CUTANEOUS | Status: DC
  Administered 2020-04-21 – 2020-04-22 (×2): 6 via TOPICAL

## 2020-04-20 MED ORDER — POTASSIUM CHLORIDE 10 MEQ/100ML IV SOLN
10.0000 meq | INTRAVENOUS | Status: AC
  Administered 2020-04-20 (×3): 10 meq via INTRAVENOUS
  Filled 2020-04-20 (×3): qty 100

## 2020-04-20 MED ORDER — POLYETHYLENE GLYCOL 3350 17 G PO PACK: 17.0000 g | PACK | Freq: Every day | ORAL | Status: DC | PRN

## 2020-04-20 MED ORDER — LACTATED RINGERS IV BOLUS
20.0000 mL/kg | Freq: Once | INTRAVENOUS | Status: AC
  Administered 2020-04-20: 1556 mL via INTRAVENOUS

## 2020-04-20 MED ORDER — MAGNESIUM SULFATE IN D5W 1-5 GM/100ML-% IV SOLN
1.0000 g | Freq: Once | INTRAVENOUS | Status: AC
  Administered 2020-04-20: 1 g via INTRAVENOUS
  Filled 2020-04-20: qty 100

## 2020-04-20 MED ORDER — INSULIN REGULAR(HUMAN) IN NACL 100-0.9 UT/100ML-% IV SOLN
INTRAVENOUS | Status: DC
  Filled 2020-04-20: qty 100

## 2020-04-20 MED ORDER — DEXTROSE 50 % IV SOLN: 0.0000 mL | INTRAVENOUS | Status: DC | PRN

## 2020-04-20 MED ORDER — SODIUM CHLORIDE 0.9 % IV BOLUS
500.0000 mL | Freq: Once | INTRAVENOUS | Status: AC
  Administered 2020-04-20: 500 mL via INTRAVENOUS

## 2020-04-20 MED ORDER — LACTATED RINGERS IV SOLN: INTRAVENOUS | Status: DC

## 2020-04-20 MED ORDER — ONDANSETRON HCL 4 MG/2ML IJ SOLN
4.0000 mg | Freq: Four times a day (QID) | INTRAMUSCULAR | Status: DC | PRN
  Administered 2020-04-28: 4 mg via INTRAVENOUS
  Filled 2020-04-20: qty 2

## 2020-04-20 MED ORDER — ADULT MULTIVITAMIN W/MINERALS CH: 1.0000 | ORAL_TABLET | Freq: Every day | ORAL | Status: DC

## 2020-04-20 MED ORDER — FOLIC ACID 1 MG PO TABS: 1.0000 mg | ORAL_TABLET | Freq: Every day | ORAL | Status: DC

## 2020-04-20 MED ORDER — ACETAMINOPHEN 160 MG/5ML PO SOLN
650.0000 mg | ORAL | Status: DC | PRN
  Administered 2020-04-23 – 2020-04-29 (×14): 650 mg
  Filled 2020-04-20 (×14): qty 20.3

## 2020-04-20 MED ORDER — VANCOMYCIN HCL 1000 MG/200ML IV SOLN
1000.0000 mg | Freq: Two times a day (BID) | INTRAVENOUS | Status: DC
  Administered 2020-04-21: 1000 mg via INTRAVENOUS
  Filled 2020-04-20: qty 200

## 2020-04-20 MED ORDER — THIAMINE HCL 100 MG PO TABS: 100.0000 mg | ORAL_TABLET | Freq: Every day | ORAL | Status: DC

## 2020-04-20 MED ORDER — LORAZEPAM 2 MG/ML IJ SOLN
0.5000 mg | Freq: Once | INTRAMUSCULAR | Status: AC | PRN
  Administered 2020-04-20: 0.5 mg via INTRAVENOUS
  Filled 2020-04-20: qty 1

## 2020-04-20 MED ORDER — ACETAMINOPHEN 650 MG RE SUPP
650.0000 mg | RECTAL | Status: DC | PRN
  Administered 2020-04-20 – 2020-04-22 (×3): 650 mg via RECTAL
  Filled 2020-04-20 (×3): qty 1

## 2020-04-20 MED ORDER — SODIUM CHLORIDE 0.9 % IV SOLN
2.0000 g | Freq: Three times a day (TID) | INTRAVENOUS | Status: DC
  Administered 2020-04-21: 2 g via INTRAVENOUS
  Filled 2020-04-20 (×2): qty 2

## 2020-04-20 MED ORDER — SODIUM CHLORIDE 0.9 % IV BOLUS
1000.0000 mL | Freq: Once | INTRAVENOUS | Status: AC
  Administered 2020-04-20: 1000 mL via INTRAVENOUS

## 2020-04-20 MED ORDER — POTASSIUM CHLORIDE 10 MEQ/100ML IV SOLN
10.0000 meq | INTRAVENOUS | Status: AC
  Administered 2020-04-20 (×2): 10 meq via INTRAVENOUS
  Filled 2020-04-20 (×2): qty 100

## 2020-04-20 MED ORDER — INSULIN ASPART 100 UNIT/ML ~~LOC~~ SOLN: 0.0000 [IU] | SUBCUTANEOUS | Status: DC

## 2020-04-20 MED ORDER — INSULIN ASPART 100 UNIT/ML ~~LOC~~ SOLN
0.0000 [IU] | SUBCUTANEOUS | Status: DC
  Administered 2020-04-20 (×2): 15 [IU] via SUBCUTANEOUS

## 2020-04-20 MED ORDER — LORAZEPAM 2 MG/ML IJ SOLN
1.0000 mg | Freq: Once | INTRAMUSCULAR | Status: AC
  Administered 2020-04-20: 1 mg via INTRAVENOUS
  Filled 2020-04-20: qty 1

## 2020-04-20 MED ORDER — INSULIN ASPART 100 UNIT/ML ~~LOC~~ SOLN
0.0000 [IU] | SUBCUTANEOUS | Status: DC
  Administered 2020-04-20: 5 [IU] via SUBCUTANEOUS
  Administered 2020-04-20: 2 [IU] via SUBCUTANEOUS
  Administered 2020-04-21 (×2): 3 [IU] via SUBCUTANEOUS
  Administered 2020-04-21: 2 [IU] via SUBCUTANEOUS
  Administered 2020-04-21: 3 [IU] via SUBCUTANEOUS
  Administered 2020-04-21: 5 [IU] via SUBCUTANEOUS
  Administered 2020-04-22: 3 [IU] via SUBCUTANEOUS
  Administered 2020-04-22: 2 [IU] via SUBCUTANEOUS
  Administered 2020-04-22 (×2): 3 [IU] via SUBCUTANEOUS
  Administered 2020-04-22: 5 [IU] via SUBCUTANEOUS
  Administered 2020-04-22: 3 [IU] via SUBCUTANEOUS
  Administered 2020-04-23: 5 [IU] via SUBCUTANEOUS
  Administered 2020-04-23: 11 [IU] via SUBCUTANEOUS
  Administered 2020-04-23: 5 [IU] via SUBCUTANEOUS
  Administered 2020-04-23: 15 [IU] via SUBCUTANEOUS
  Administered 2020-04-23 (×2): 5 [IU] via SUBCUTANEOUS
  Administered 2020-04-23: 8 [IU] via SUBCUTANEOUS
  Administered 2020-04-24: 3 [IU] via SUBCUTANEOUS
  Administered 2020-04-24: 8 [IU] via SUBCUTANEOUS
  Administered 2020-04-24: 5 [IU] via SUBCUTANEOUS
  Administered 2020-04-24: 3 [IU] via SUBCUTANEOUS
  Administered 2020-04-24: 8 [IU] via SUBCUTANEOUS
  Administered 2020-04-24: 5 [IU] via SUBCUTANEOUS
  Administered 2020-04-25: 8 [IU] via SUBCUTANEOUS
  Administered 2020-04-25: 11 [IU] via SUBCUTANEOUS
  Administered 2020-04-25: 8 [IU] via SUBCUTANEOUS
  Administered 2020-04-25: 11 [IU] via SUBCUTANEOUS
  Administered 2020-04-25: 8 [IU] via SUBCUTANEOUS
  Administered 2020-04-26: 5 [IU] via SUBCUTANEOUS
  Administered 2020-04-26: 11 [IU] via SUBCUTANEOUS
  Administered 2020-04-26: 8 [IU] via SUBCUTANEOUS

## 2020-04-20 MED ORDER — SODIUM CHLORIDE 0.9 % IV SOLN
2.0000 g | Freq: Once | INTRAVENOUS | Status: AC
  Administered 2020-04-20: 2 g via INTRAVENOUS
  Filled 2020-04-20: qty 2

## 2020-04-20 NOTE — Progress Notes (Signed)
eLink Physician-Brief Progress Note Patient Name: Zachary Woods DOB: 03-10-1965 MRN: [id number]   Date of Service  05-10-20  HPI/Events of Note  Patient with sinus tachycardia, lactic acid 5.8, echocardiogram with EF of 60-65 and  consistent with underloaded LV.  eICU Interventions  Normal Saline 500 ml iv bolus x 1 now.        Zachary Woods 05/10/20, 9:59 PM

## 2020-04-20 NOTE — Progress Notes (Signed)
eLink Physician-Brief Progress Note Patient Name: Zachary Woods DOB: [birth year] MRN: [id number]   Date of Service  04-24-2020  HPI/Events of Note  Patient with persistent tachycardia despite fluid bolus.  eICU Interventions  12 lead EKG ordered.        Thomasene Lot Donte Kary 04/24/20, 11:17 PM

## 2020-04-20 NOTE — Code Documentation (Addendum)
Stroke Response Nurse Documentation Code Documentation  Zachary Woods is a 55 y.o. male arriving to [facility] ED via [facility] on 2/18 with past medical hx of previous CVA without deficit. Code stroke was activated by EMS. Patient from home where he was LKW at 2100 and now complaining of Right facial droop, right weakness and aphasia . On No antithrombotic. Stroke team at the bedside on patient arrival. Labs drawn and patient cleared for CT by Jodi Geralds PA. Patient to CT with team. NIHSS 23, see documentation for details and code stroke times. Patient with disoriented, not following commands, right hemianopia, right facial droop, right arm weakness, right leg weakness, right decreased sensation, Global aphasia , dysarthria  and Visual  neglect on exam. The following imaging was completed:  CTA Head and neck, CTP. Patient is not a candidate for tPA due to Out of window. Reportedly, pt fell at home prior to calling EMS. Bedside handoff with ED RN Melissa.    Rose Fillers  Rapid Response RN

## 2020-04-20 NOTE — ED Notes (Addendum)
cbg delayed due to pt getting  An eeg

## 2020-04-20 NOTE — H&P (Deleted)
Neurology Consult  CC: Right sided weakness  History is obtained from:patient  HPI: Zachary Woods is a 55 y.o. male with a history of previous stroke s/p PFO closure 08/01/2016 who was in his normal state when he went to bed around 9 PM.  He started to try to get out of bed and slid down the side of the bed. The wife denies significant fall. He was brought in as a code stroke and was taken for emergent evaluation with CT/CTA/CTP.  This demonstrated a moderate sized MCA territory ischemic area with the majority having already infarcted.    LKW: 9 pm  tpa given?: No, out of window.  IR Thrombectomy? No, CTP not amenable Modified Rankin Scale: 0-Completely asymptomatic and back to baseline post- stroke NIHSS: 23   ROS: Unable to obtain due to altered mental status.   Past Medical History:  Diagnosis Date  . Acid reflux   . CVA (cerebral vascular accident) (HCC)   . Hypertension      Family History  Problem Relation Age of Onset  . Diabetes Father   . Diabetes Brother      Social History:  reports that he has quit smoking. He has never used smokeless tobacco. He reports current alcohol use of about 1.0 standard drink of alcohol per week. He reports that he does not use drugs.   Prior to Admission medications   Medication Sig Start Date End Date Taking? Authorizing Provider  amLODipine (NORVASC) 10 MG tablet Take 1 tablet (10 mg total) by mouth daily. 02/04/18   Shade Flood, MD  aspirin EC 81 MG tablet Take 1 tablet (81 mg total) by mouth daily. 08/05/17   Janetta Hora, PA-C  hydrochlorothiazide (MICROZIDE) 12.5 MG capsule TAKE 1 CAPSULE BY MOUTH EVERY DAY Patient not taking: Reported on 08/04/2019 06/23/18   Shade Flood, MD  Ibuprofen-Famotidine (DUEXIS) 800-26.6 MG TABS Take by mouth.    [provider]  meloxicam (MOBIC) 7.5 MG tablet Take 7.5 mg by mouth daily. 06/17/19   [provider]  metFORMIN (GLUCOPHAGE) 500 MG tablet Take 1 tablet (500  mg total) by mouth daily with breakfast. 08/04/19   Shade Flood, MD  Multiple Vitamin (MULTIVITAMIN WITH MINERALS) TABS tablet Take 1 tablet by mouth daily.    [provider]  pantoprazole (PROTONIX) 40 MG tablet TAKE 1 TABLET BY MOUTH EVERY DAY 03/25/17   Tonny Bollman, MD  sildenafil (VIAGRA) 100 MG tablet Take 0.5-1 tablets (50-100 mg total) by mouth daily as needed for erectile dysfunction. 11/27/16   Shade Flood, MD  sulfamethoxazole-trimethoprim (BACTRIM DS) 800-160 MG tablet Take 1 tablet by mouth 2 (two) times daily. 08/02/19   [provider]  triamcinolone cream (KENALOG) 0.1 % Apply 1 application topically 2 (two) times daily. 08/04/19   Shade Flood, MD     Exam: Current vital signs: BP 127/90   Pulse 69   Temp 97.7 F (36.5 C) (Oral)   Resp 20   Wt 77.8 kg   SpO2 96%   BMI 26.86 kg/m    Physical Exam  Constitutional: Appears well-developed and well-nourished.  Psych: Affect appropriate to situation Eyes: No scleral injection HENT: No OP obstrucion Head: Normocephalic.  Cardiovascular: Normal rate and regular rhythm.  Respiratory: Effort normal and breath sounds normal to anterior ascultation GI: Soft.  No distension. There is no tenderness.  Skin: WDI  Neuro: Mental Status: Patient is awake, alert,  He has significant aphasia.  He is  able to make a fist, but does not reliably follow commands. Cranial Nerves: II: Right hemianopia pupils are equal, round, and reactive to light.   III,IV, VI: He is a left gaze preference V: Facial sensation is symmetric to temperature VII: Facial movement with right facial weakness VIII: hearing is intact to voice X: Uvula elevates symmetrically XI: Shoulder shrug is symmetric. XII: tongue is midline without atrophy or fasciculations.  Motor: Tone is normal. Bulk is normal. 5/5 strength was present on the left, some withdrawal in the right leg, minimal movement right arm.  Sensory: Sensation is  symmetric to light touch and temperature in the arms and legs. Cerebellar: FNF and HKS are intact bilaterally  I have reviewed labs in epic and the pertinent results are: Na 124 Glucose 512 K 2.8 Cr 1.55  I have reviewed the images obtained: CT - small single sulcal SAH CTA- occlusion of a trifurcation branch of the left MCA CTP - Ischemic area 37cc, ischemic core 64ml by CBF<30%, CBV - 42ml  Primary Diagnosis:  Cerebral infarction due to embolism of  left middle cerebral artery.   Secondary Diagnosis: Essential (primary) hypertension, Type 2 diabetes mellitus with hyperglycemia , Acute Kidney Failure and Hypokalemia   Impression: 55 yo M with acute left MCA branch occlusion. Unfortunately, the area of infarct is already significant, and therefore I think that revascularization would be significant risk of hemorrhage for marginal gain. His small SAH I think is likely incidental. He bumps his head frequently at work and this is a possible etiology, but did not have a significant trauma tonight per his wife. This is unlikely to cause significant problems, and therefore as long as stable on imaging in a few hours, I would not hold antiplatelets.   Plan: - HgbA1c, fasting lipid panel - MRI the brain without contrast - Frequent neuro checks - Echocardiogram - Carotid dopplers - Prophylactic therapy-would ensure stability of SAH on MRI, then could start ASA 325mg  daily.  - Risk factor modification - Telemetry monitoring - PT consult, OT consult, Speech consult - Stroke team to follow    This patient is critically ill and at significant risk of neurological worsening, death and care requires constant monitoring of vital signs, hemodynamics,respiratory and cardiac monitoring, neurological assessment, discussion with family, other specialists and medical decision making of high complexity. I spent 60 minutes of neurocritical care time  in the care of  this patient. This was time spent  independent of any time provided by nurse practitioner or PA.  , MD [facility] [phone]  If 7pm- 7am, please page neurology on call as listed in AMION.

## 2020-04-20 NOTE — ED Notes (Signed)
Holding on the insulin drip while pharmacy checks the order  With new labs considered

## 2020-04-20 NOTE — H&P (Deleted)
Neurology H&P  CC: Right sided weakness  History is obtained from:patient  HPI: Zachary Woods is a 55 y.o. male with a history of previous stroke s/p PFO closure 08/01/2016 who was in his normal state when he went to bed around 9 PM.  He started to try to get out of bed and slid down the side of the bed. The wife denies significant fall. He was brought in as a code stroke and was taken for emergent evaluation with CT/CTA/CTP.  This demonstrated a moderate sized MCA territory ischemic area with the majority having already infarcted.    LKW: 9 pm  tpa given?: No, out of window.  IR Thrombectomy? No, CTP not amenable Modified Rankin Scale: 0-Completely asymptomatic and back to baseline post- stroke NIHSS: 23   ROS: Unable to obtain due to altered mental status.   Past Medical History:  Diagnosis Date  . Acid reflux   . CVA (cerebral vascular accident) (HCC)   . Hypertension      Family History  Problem Relation Age of Onset  . Diabetes Father   . Diabetes Brother      Social History:  reports that he has quit smoking. He has never used smokeless tobacco. He reports current alcohol use of about 1.0 standard drink of alcohol per week. He reports that he does not use drugs.   Prior to Admission medications   Medication Sig Start Date End Date Taking? Authorizing Provider  amLODipine (NORVASC) 10 MG tablet Take 1 tablet (10 mg total) by mouth daily. 02/04/18   Shade Flood, MD  aspirin EC 81 MG tablet Take 1 tablet (81 mg total) by mouth daily. 08/05/17   Janetta Hora, PA-C  hydrochlorothiazide (MICROZIDE) 12.5 MG capsule TAKE 1 CAPSULE BY MOUTH EVERY DAY Patient not taking: Reported on 08/04/2019 06/23/18   Shade Flood, MD  Ibuprofen-Famotidine (DUEXIS) 800-26.6 MG TABS Take by mouth.    [provider]  meloxicam (MOBIC) 7.5 MG tablet Take 7.5 mg by mouth daily. 06/17/19   [provider]  metFORMIN (GLUCOPHAGE) 500 MG tablet Take 1 tablet (500 mg  total) by mouth daily with breakfast. 08/04/19   Shade Flood, MD  Multiple Vitamin (MULTIVITAMIN WITH MINERALS) TABS tablet Take 1 tablet by mouth daily.    [provider]  pantoprazole (PROTONIX) 40 MG tablet TAKE 1 TABLET BY MOUTH EVERY DAY 03/25/17   Tonny Bollman, MD  sildenafil (VIAGRA) 100 MG tablet Take 0.5-1 tablets (50-100 mg total) by mouth daily as needed for erectile dysfunction. 11/27/16   Shade Flood, MD  sulfamethoxazole-trimethoprim (BACTRIM DS) 800-160 MG tablet Take 1 tablet by mouth 2 (two) times daily. 08/02/19   [provider]  triamcinolone cream (KENALOG) 0.1 % Apply 1 application topically 2 (two) times daily. 08/04/19   Shade Flood, MD     Exam: Current vital signs: Wt 77.8 kg   BMI 26.86 kg/m    Physical Exam  Constitutional: Appears well-developed and well-nourished.  Psych: Affect appropriate to situation Eyes: No scleral injection HENT: No OP obstrucion Head: Normocephalic.  Cardiovascular: Normal rate and regular rhythm.  Respiratory: Effort normal and breath sounds normal to anterior ascultation GI: Soft.  No distension. There is no tenderness.  Skin: WDI  Neuro: Mental Status: Patient is awake, alert,  He has significant aphasia.  He is able to make a fist, but does not reliably follow commands. Cranial Nerves: II: Right hemianopia pupils are equal, round, and reactive to light.  III,IV, VI: He is a left gaze preference V: Facial sensation is symmetric to temperature VII: Facial movement with right facial weakness VIII: hearing is intact to voice X: Uvula elevates symmetrically XI: Shoulder shrug is symmetric. XII: tongue is midline without atrophy or fasciculations.  Motor: Tone is normal. Bulk is normal. 5/5 strength was present on the left, some withdrawal in the right leg, minimal movement right arm.  Sensory: Sensation is symmetric to light touch and temperature in the arms and legs. Cerebellar: FNF and  HKS are intact bilaterally  I have reviewed labs in epic and the pertinent results are: Na 124 Glucose 512 K 2.8 Cr 1.55  I have reviewed the images obtained: CT - small single sulcal SAH CTA- occlusion of a trifurcation branch of the left MCA CTP - Ischemic area 37cc, ischemic core 44ml by CBF<30%, CBV - 26ml  Primary Diagnosis:  Cerebral infarction due to embolism of  left middle cerebral artery.   Secondary Diagnosis: Essential (primary) hypertension, Type 2 diabetes mellitus with hyperglycemia , Acute Kidney Failure and Hypokalemia   Impression: 55 yo M with acute left MCA branch occlusion. Unfortunately, the area of infarct is already significant, and therefore I think that revascularization would be significant risk of hemorrhage for marginal gain. His small SAH I think is likely incidental. He bumps his head frequently at work and this is a possible etiology, but did not have a significant trauma tonight per his wife. This is unlikely to cause significant problems, and therefore as long as stable on imaging in a few hours, I would not hold antiplatelets.   Plan: - HgbA1c, fasting lipid panel - MRI the brain without contrast - Frequent neuro checks - Echocardiogram - Carotid dopplers - Prophylactic therapy-would ensure stability of SAH on MRI, then could start ASA 325mg  daily.  - Risk factor modification - Telemetry monitoring - PT consult, OT consult, Speech consult - Stroke team to follow    This patient is critically ill and at significant risk of neurological worsening, death and care requires constant monitoring of vital signs, hemodynamics,respiratory and cardiac monitoring, neurological assessment, discussion with family, other specialists and medical decision making of high complexity. I spent 60 minutes of neurocritical care time  in the care of  this patient. This was time spent independent of any time provided by nurse practitioner or PA.  ,  MD [facility] [phone]  If 7pm- 7am, please page neurology on call as listed in AMION.

## 2020-04-20 NOTE — Progress Notes (Signed)
Pharmacy Antibiotic Note  Zachary Woods is a 55 y.o. male admitted on 23-Apr-2020 with infection of unknown source.  Pharmacy has been consulted for Cefepime and vancomycin dosing.  WBC 12, SCr 1.53, LA 7.4  Plan: -Cefepime 2 gm IV Q 8 hours -Vancomycin 1500 mg IV load followed by vancomycin 1 gm IV Q 12 hours -Monitor CBC, renal fx, cultures and clinical progress -VT at [facility]    Weight: 77.8 kg (171 lb 8.3 oz)  Temp (24hrs), Avg:99.2 F (37.3 C), Min:97.7 F (36.5 C), Max:100.7 F (38.2 C)  Recent Labs  Lab 23-Apr-2020 0416 04/11/2020 0424 04/21/2020 1217  WBC 6.7  --   --   CREATININE 1.55* 1.20 1.53*  LATICACIDVEN  --   --  7.4*    CrCl cannot be calculated (Unknown ideal weight.).    Allergies  Allergen Reactions  . Penicillins Itching    Has patient had a PCN reaction causing immediate rash, facial/tongue/throat swelling, SOB or lightheadedness with hypotension: No Has patient had a PCN reaction causing severe rash involving mucus membranes or skin necrosis: No Has patient had a PCN reaction that required hospitalization: No Has patient had a PCN reaction occurring within the last 10 years: Yes If all of the above answers are "NO", then may proceed with Cephalosporin use.    Antimicrobials this admission: Cefepime 2/18 >>  Vanc 2/18 >>   Dose adjustments this admission:  Microbiology results: 2/18 BCx:    Thank you for allowing pharmacy to be a part of this patient's care.  Vinnie Level, PharmD., BCPS, BCCCP Clinical Pharmacist Please refer to [facility] for unit-specific pharmacist

## 2020-04-20 NOTE — Progress Notes (Addendum)
Patient lactic acid was elevated at 7.4.  Unclear if there is underlying infection.  Neurology concerned for the possibility of endocarditis.  Blood cultures were ordered.  Will start on empiric antibiotics of vancomycin and cefepime for possibility of sepsis due to unknown source.  PCCM consulted due to the severity of patient's symptoms and confusion.

## 2020-04-20 NOTE — ED Notes (Signed)
nih attempted but the pt is unable to answer questions asked at this time and is unable to follow commands

## 2020-04-20 NOTE — CV Procedure (Signed)
Echocardiogram not completed, patient is in MRI. Will re-attempt when the MRI is complete.  Leta Jungling RDCS

## 2020-04-20 NOTE — ED Triage Notes (Signed)
Patient arrives with [facility] from home, [facility] 2100, patient fell and wife found him on the ground, R sided facial droop, R arm/leg weakness, slurred speech. Patient with hx of stroke 2 years ago, no deficits post stroke.   EMS vitals 138/88 120 HR 98% RA 18 RR CBG 520

## 2020-04-20 NOTE — ED Notes (Signed)
Insulin drip has been discontinued per the pharmacist

## 2020-04-20 NOTE — ED Provider Notes (Signed)
Patient seen/examined in the Emergency Department in conjunction with Advanced Practice Provider [facility] Patient presents with presumed stroke, LKW 2100 on 2/17.  Pt with right sided deficits Exam : Patient appears confused, but does follow commands.  He has significant weakness of the right arm and leg. Right facial droop noted Plan: Discussed case with Dr. Amada Jupiter with neurology as patient has large left MCA stroke.  Unfortunately patient is out of the window for any sort of intervention.  tPA in stroke considered but not given due to: Onset over 3-4.5hours      Zadie Rhine, MD 04/21/2020 [phone]

## 2020-04-20 NOTE — Progress Notes (Signed)
eLink Physician-Brief Progress Note Patient Name: Zachary Woods DOB: 1965-07-10 MRN: [id number]   Date of Service  04/21/2020  HPI/Events of Note  Patient admitted with left MCA and PCA territory CVA's, small volume sub-arachnoid hemorrhage,  hyperosmolar syndrome of type 2 diabetes mellitus, altered mental status, and concern for potential respiratory failure necessitating intubation for airway protection (patient is not intubated at the moment).  eICU Interventions  New Patient Evaluation completed.        Zachary Woods Zachary Woods 04/23/2020, 8:19 PM

## 2020-04-20 NOTE — ED Notes (Signed)
Pt to ct scan.

## 2020-04-20 NOTE — Progress Notes (Signed)
  Echocardiogram 2D Echocardiogram has been performed.  Zachary Woods 04/19/2020, 10:30 AM

## 2020-04-20 NOTE — Progress Notes (Signed)
EEG completed, result pending 

## 2020-04-20 NOTE — ED Notes (Signed)
cbg 225  Went ti give sliding scale  Order hasd expired  Calls made to admiting doctors waiting for  Orders  By dr byrum  To go ahead and give the 5 units of regular insulin

## 2020-04-20 NOTE — Consult Note (Signed)
NAME:  Zachary Woods Cohill, MRN:  [id number], DOB:  1965/05/11, LOS: 0 ADMISSION DATE:  2021-01-03, CONSULTATION DATE:  2/18 REFERRING MD:  Dr. Madelyn Flavorsondell Smith , CHIEF COMPLAINT:  AMS with acute stroke   Brief History:  Zachary Woods Ehly is a 55 y.o. male who was admitted 2/18 with left MCA infarct and small right frontal SAH.  Required intubation for progressive AMS.  History of Present Illness:  Pt is encephelopathic; therefore, this HPI is obtained from chart review. Zachary Woods Auth is a 55 y.o. male who has a PMH including but not limited to HTN, CVA, PFO Woods/p closure 2018, DM 2, GERD (see "past medical history" for rest).  [facility]/18 with right sided weakness.  He was last normal the night prior when he went to bed around 9PM.  Wife woke up around 3AM when pt was trying to get up to use the restroom but was unable to do so and slid from bed to ground (no loss of consciousness).   In ED, CT head demonstrated small volume SAH.  CTA / CTP demonstrated mod sized left MCA territory ischemic area.  [facility]; however, later in the day had progressive AMS to the point he required intubation.  PCCM subsequently called in consultation.  Per notes, neuro had raised concern for whether pt could have underlying endocarditis.  TTE did not suggest valvular vegetations, TEE pending.  Started on vanc / cefepime empirically.  Past Medical History:  has Hypertension; GERD (gastroesophageal reflux disease); Malignant hypertension; Cerebral embolism with cerebral infarction; Stroke determined by clinical assessment (HCC); PFO (patent foramen ovale); Acute CVA (cerebrovascular accident) (HCC); Subarachnoid hemorrhage (HCC); Type 2 diabetes mellitus with hyperosmolar hyperglycemic state (HHS) (HCC); AKI (acute kidney injury) (HCC); SIRS (systemic inflammatory response syndrome) (HCC); Metabolic acidosis with increased anion gap and accumulation of organic acids; Hypomagnesemia;  Hypokalemia; Thrombocytopenia (HCC); and Stroke [facility](HCC) on their problem list.  Significant Hospital Events:  2/18 > admit, intubated.  Consults:  Neuro, Cards.  Procedures:    Significant Diagnostic Tests:  CT / CTA / CTP head and neck 2/18 > small volume right frontal SAH, left MCA occlusion, ischemic area of 37cc with core infarct 21cc, 60% prox left ICA stenosis. MRI brain 2/18 > mod to large acute left MCA infarct, 1.8cm acute cortical / subcortical left occipital lobe infarct, small volume right frontal lobe SAH. Echo 2/18 > EF 60 - 65%, no source of embolism detected. LE duplex 2/18 > neg. Carotid US 2/18 > 1 - 39% stenosis left ICA. EEG 2/18 > mod to diffuse encephalopathy without seizures or epileptiform discharges. TEE 2/18 >   Micro Data:  COVID 2/18 > neg. Flu 2/18 > neg.  Antimicrobials:  Vanc 2/18 >  Cefepime 2/18 >   Interim History / Subjective:  See above   Objective   Blood pressure 115/81, pulse (!) 125, temperature (!) 100.7 F (38.2 C), temperature source Rectal, resp. rate (!) 31, weight 77.8 kg, SpO2 97 %.        Intake/Output Summary (Last 24 hours) at 2021-01-03 1619 Last data filed at 2021-01-03 1212 Gross per 24 hour  Intake 800 ml  Output --  Net 800 ml   Filed Weights   03/14/2020 0400  Weight: 77.8 kg    Examination: General: Middle aged ill appearing male lying in bed in NAD HEENT: [place]/AT, MM pink/moist, PERRL,  Neuro: Opens eyes to verbal stimuli  CV: s1s2 regular rate and  rhythm, no murmur, rubs, or gallops,  PULM:  Clear bilaterally, tachypnea, no acute resp distress GI: soft, bowel sounds active in all 4 quadrants, non-tender, non-distended Extremities: warm/dry, no edema  Skin: no rashes or lesions  Resolved Hospital Problem list     Assessment & Plan:  Left MCA stroke with left MCA branch occlusion Small right frontal subarachnoid -Etiology unknown, family denies any recent trauma P: Management per neurology  Maintain  neuro protective measures; goal for eurothermia, euglycemia, eunatermia, normoxia, and PCO2 goal of 35-40 Nutrition and bowel regiment  Seizure precautions  AEDs per neurology  Aspirations precautions  Frequent neuro check  SIRS with concern for evolving sepsis  -Patient initially presented mildly tachypneic and tachycardic.  Has since began to develop low-grade fever of 100.7.  Lactic acid is 7.4 no leukocytosis currently P: No obvious source of infection currently Panculture IV hydration Trend lactic acid Low threshold for initiation of antibiotics Close monitoring in the ICU setting  HHS -Presented with hyperglycemia and open gap Uncontrolled type 2 diabetes  -Hemoglobin A1c 10.5 P: IV hydration  Insulin drip  Closely monitor patient'Woods electrolytes with frequent Bmets especially potassium Maintain potassium greater than 4 Accu-Cheks every 1 hour Once blood glucose falls below 250 start patient on D5 IV fluids When anion gap closes and patient is able to tolerate oral diet transition to subcu long-acting insulin in slowly turned drip off over the next 1-2 hours Monitor renal function Monitor pH if falls below 7 consider starting bicarb bolus/drip Monitor lactate  Acute Kidney Injury  -in the setting of above.  Creatinine 0.75 in 2019, currently creatinine 1.53 P: Follow renal function / urine output Trend Bmet Avoid nephrotoxins Ensure adequate renal perfusion  IV hydration  History of hypertension -Home medications include Norvasc and HCTZ P: Permissive hypertension  Close monitoring of hemodynamics in the ICU setting  Hyperlipidemia -Previously on statin but this was stopped due to myalgia -Triglycerides 327 P: Will need high intensity statin  Alcohol use -Patient'Woods wife reports he consumes 1 " drink" of vodka a couple times per week.  Reports no prior episodes of alcohol withdrawal in seizures P: Monitor for signs of alcohol withdrawal Consider Precedex  for worsening of any agitation  Multiple metabolic derangements P: Trend bmet Supplement potassium and magnesium  Thrombocytopenia  -Plt count 74, unknown etiology  P: Trend CBC  Monitor for signs of bleeding    Best practice (evaluated daily)  Diet: NPO Pain/Anxiety/Delirium protocol (if indicated): As needed  VAP protocol (if indicated): N/A DVT prophylaxis: SCD  GI prophylaxis: PPI Glucose control: Insulin drip Mobility: Bedrest  Disposition:ICU  Goals of Care:  Last date of multidisciplinary goals of care discussion:Pending  Family and staff present: Pending  Summary of discussion: Pending  Follow up goals of care discussion due: Pending  Code Status: Full  Labs   CBC: Recent Labs  Lab 04/10/2020 0416 04/29/2020 0424  WBC 6.7  --   NEUTROABS 6.1  --   HGB 15.4 15.6  HCT 42.0 46.0  MCV 93.5  --   PLT 74*  --     Basic Metabolic Panel: Recent Labs  Lab 04/16/2020 0416 04/13/2020 0424 04/07/2020 1217  NA 124* 126* 126*  K 2.8* 2.8* 2.9*  CL 89* 88* 94*  CO2 19*  --  16*  GLUCOSE 512* 522* 416*  BUN 27* 27* 23*  CREATININE 1.55* 1.20 1.53*  CALCIUM 8.3*  --  8.0*  MG 1.6*  --   --  GFR: CrCl cannot be calculated (Unknown ideal weight.). Recent Labs  Lab 04/13/2020 0416 04/21/2020 1217  WBC 6.7  --   LATICACIDVEN  --  7.4*    Liver Function Tests: Recent Labs  Lab 04/19/2020 0416  AST 40  ALT 23  ALKPHOS 127*  BILITOT 1.7*  PROT 6.2*  ALBUMIN 2.8*   No results for input(Woods): LIPASE, AMYLASE in the last 168 hours. No results for input(Woods): AMMONIA in the last 168 hours.  ABG    Component Value Date/Time   TCO2 19 (L) 04/26/2020 0424     Coagulation Profile: Recent Labs  Lab 04/11/2020 0416  INR 1.3*    Cardiac Enzymes: No results for input(Woods): CKTOTAL, CKMB, CKMBINDEX, TROPONINI in the last 168 hours.  HbA1C: Hgb A1c MFr Bld  Date/Time Value Ref Range Status  04/14/2020 12:17 PM 10.5 (H) 4.8 - 5.6 % Final    Comment:    (NOTE) Pre  diabetes:          5.7%-6.4%  Diabetes:              >6.4%  Glycemic control for   <7.0% adults with diabetes   08/04/2019 05:25 PM 8.2 (H) 4.8 - 5.6 % Final    Comment:             Prediabetes: 5.7 - 6.4          Diabetes: >6.4          Glycemic control for adults with diabetes: <7.0     CBG: Recent Labs  Lab 04/10/2020 0408 04/27/2020 1001 04/29/2020 1256  GLUCAP 466* 413* 375*    Review of Systems:   Unable to assess   Past Medical History:  He,  has a past medical history of Acid reflux, CVA (cerebral vascular accident) (HCC), Diabetes mellitus, type 2 (HCC), and Hypertension.   Surgical History:   Past Surgical History:  Procedure Laterality Date  . PATENT FORAMEN OVALE(PFO) CLOSURE N/A 08/01/2016   Procedure: Patent Forament [place](PFO) Closure;  Surgeon: Tonny Bollman, MD;  [facility] INVASIVE CV LAB;  Service: Cardiovascular;  Laterality: N/A;  . TEE WITHOUT CARDIOVERSION N/A 07/21/2016   Procedure: TRANSESOPHAGEAL ECHOCARDIOGRAM (TEE);  Surgeon: Lars Masson, MD;  Location: [facility] ENDOSCOPY;  Service: Cardiovascular;  Laterality: N/A;     Social History:   reports that he has quit smoking. He has never used smokeless tobacco. He reports current alcohol use of about 1.0 standard drink of alcohol per week. He reports that he does not use drugs.   Family History:  His family history includes Diabetes in his brother and father.   Allergies Allergies  Allergen Reactions  . Penicillins Itching    Has patient had a PCN reaction causing immediate rash, facial/tongue/throat swelling, SOB or lightheadedness with hypotension: No Has patient had a PCN reaction causing severe rash involving mucus membranes or skin necrosis: No Has patient had a PCN reaction that required hospitalization: No Has patient had a PCN reaction occurring within the last 10 years: Yes If all of the above answers are "NO", then may proceed with Cephalosporin use.     Home Medications  Prior  to Admission medications   Medication Sig Start Date End Date Taking? Authorizing Provider  amLODipine (NORVASC) 10 MG tablet Take 1 tablet (10 mg total) by mouth daily. 02/04/18  Yes Shade Flood, MD  aspirin EC 81 MG tablet Take 1 tablet (81 mg total) by mouth daily. 08/05/17  Yes Janetta Hora, PA-C  ibuprofen (ADVIL) 200 MG tablet Take 400 mg by mouth every 6 (six) hours as needed for headache or mild pain.   Yes [provider]  meloxicam (MOBIC) 7.5 MG tablet Take 7.5 mg by mouth daily. 06/17/19  Yes [provider]  metFORMIN (GLUCOPHAGE) 500 MG tablet Take 1 tablet (500 mg total) by mouth daily with breakfast. 08/04/19  Yes Shade Flood, MD  Multiple Vitamin (MULTIVITAMIN WITH MINERALS) TABS tablet Take 1 tablet by mouth daily.   Yes [provider]  omeprazole (PRILOSEC OTC) 20 MG tablet Take 20 mg by mouth daily.   Yes [provider]  pantoprazole (PROTONIX) 40 MG tablet TAKE 1 TABLET BY MOUTH EVERY DAY Patient taking differently: Take 40 mg by mouth daily. 03/25/17  Yes Tonny Bollman, MD  sildenafil (VIAGRA) 100 MG tablet Take 0.5-1 tablets (50-100 mg total) by mouth daily as needed for erectile dysfunction. 11/27/16  Yes Shade Flood, MD  Tetrahydrozoline HCl (VISINE OP) Place 1 drop into both eyes daily as needed (dry eyes).   Yes [provider]  hydrochlorothiazide (MICROZIDE) 12.5 MG capsule TAKE 1 CAPSULE BY MOUTH EVERY DAY Patient not taking: No sig reported 06/23/18   Shade Flood, MD     Critical care time:    Performed by: Delfin Gant  Total critical care time: 50 minutes  Critical care time was exclusive of separately billable procedures and treating other patients.  Critical care was necessary to treat or prevent imminent or life-threatening deterioration.  Critical care was time spent personally by me on the following activities: development of treatment plan with patient and/or surrogate as well  as nursing, discussions with consultants, evaluation of patient'Woods response to treatment, examination of patient, obtaining history from patient or surrogate, ordering and performing treatments and interventions, ordering and review of laboratory studies, ordering and review of radiographic studies, pulse oximetry and re-evaluation of patient'Woods condition.  Delfin Gant, NP-C [facility] Personal contact information can be found on Amion  If no response please page: Adult pulmonary and critical care medicine pager on Amion unitl 7pm After 7pm please call [phone] 04/18/2020, 5:11 PM

## 2020-04-20 NOTE — ED Notes (Signed)
Report given to rn on 4n 

## 2020-04-20 NOTE — ED Provider Notes (Addendum)
[facility] EMERGENCY DEPARTMENT Provider Note   CSN: [id number] Arrival date & time: 04/17/2020  0406     History Chief Complaint  Patient presents with  . Code Stroke    Zachary Woods is a 55 y.o. male.  Zachary Woods is a 55 y.o. male with a history of previous stroke, hypertension, diabetes, GERD, PFO, who presents to the emergency department via EMS as a code stroke.  On arrival patient is aphasic, majority of history obtained from EMS.  EMS reports last known well time of 9 PM when patient went to bed.  EMS was called by wife when she woke up to find that he had fallen from bed, per EMS patient was found to be aphasic with right-sided facial droop and right-sided weakness in the arm and leg.  History of previous stroke but no residual deficits.  Additional history obtained from wife who is now present, reports that he went to bed as per usual, she reports that it seemed that he slid out of bed.  Reports that yesterday he was more tired than usual and stayed home from work.  Reports that over the past few days he has not felt like himself, has felt feverish but has not had any fevers, has had some diarrhea and decreased appetite.  Has not complained of any chest pain, abdominal pain or shortness of breath.  No known sick contacts.  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  Level 5 caveat: Aphasia        Past Medical History:  Diagnosis Date  . Acid reflux   . CVA (cerebral vascular accident) (HCC)   . Hypertension     Patient Active Problem List   Diagnosis Date Noted  . PFO (patent foramen ovale)   . Cerebral embolism with cerebral infarction 07/20/2016  . Stroke determined by clinical assessment (HCC) 07/20/2016  . Hypertension 07/19/2016  . GERD (gastroesophageal reflux disease) 07/19/2016  . Malignant hypertension 07/19/2016    Past Surgical History:  Procedure Laterality Date  . NO PAST SURGERIES    . PATENT FORAMEN OVALE(PFO) CLOSURE N/A 08/01/2016    Procedure: Patent Forament [place](PFO) Closure;  Surgeon: Tonny Bollman, MD;  Location: [facility] INVASIVE CV LAB;  Service: Cardiovascular;  Laterality: N/A;  . TEE WITHOUT CARDIOVERSION N/A 07/21/2016   Procedure: TRANSESOPHAGEAL ECHOCARDIOGRAM (TEE);  Surgeon: Lars Masson, MD;  Location: [facility] ENDOSCOPY;  Service: Cardiovascular;  Laterality: N/A;       Family History  Problem Relation Age of Onset  . Diabetes Father   . Diabetes Brother     Social History   Tobacco Use  . Smoking status: Former Games developer  . Smokeless tobacco: Never Used  Vaping Use  . Vaping Use: Never used  Substance Use Topics  . Alcohol use: Yes    Alcohol/week: 1.0 standard drink    Types: 1 Shots of liquor per week    Comment: 10-12/ week-- (2-4 drinks/ day)  . Drug use: No    Home Medications Prior to Admission medications   Medication Sig Start Date End Date Taking? Authorizing Provider  amLODipine (NORVASC) 10 MG tablet Take 1 tablet (10 mg total) by mouth daily. 02/04/18  Yes Shade Flood, MD  aspirin EC 81 MG tablet Take 1 tablet (81 mg total) by mouth daily. 08/05/17  Yes Janetta Hora, PA-C  ibuprofen (ADVIL) 200 MG tablet Take 400 mg by mouth every 6 (six) hours as needed for headache or mild pain.  Yes [provider]  meloxicam (MOBIC) 7.5 MG tablet Take 7.5 mg by mouth daily. 06/17/19  Yes [provider]  metFORMIN (GLUCOPHAGE) 500 MG tablet Take 1 tablet (500 mg total) by mouth daily with breakfast. 08/04/19  Yes Shade FloodGreene, Jeffrey R, MD  Multiple Vitamin (MULTIVITAMIN WITH MINERALS) TABS tablet Take 1 tablet by mouth daily.   Yes [provider]  omeprazole (PRILOSEC OTC) 20 MG tablet Take 20 mg by mouth daily.   Yes [provider]  pantoprazole (PROTONIX) 40 MG tablet TAKE 1 TABLET BY MOUTH EVERY DAY Patient taking differently: Take 40 mg by mouth daily. 03/25/17  Yes Tonny Bollmanooper, Michael, MD  sildenafil (VIAGRA) 100 MG tablet Take 0.5-1 tablets (50-100 mg  total) by mouth daily as needed for erectile dysfunction. 11/27/16  Yes Shade FloodGreene, Jeffrey R, MD  Tetrahydrozoline HCl (VISINE OP) Place 1 drop into both eyes daily as needed (dry eyes).   Yes [provider]  hydrochlorothiazide (MICROZIDE) 12.5 MG capsule TAKE 1 CAPSULE BY MOUTH EVERY DAY Patient not taking: No sig reported 06/23/18   Shade FloodGreene, Jeffrey R, MD    Allergies    Penicillins  Review of Systems   Review of Systems  Unable to perform ROS: Patient nonverbal    Physical Exam Updated Vital Signs BP 127/90   Pulse 69   Temp 97.7 F (36.5 C) (Oral)   Resp 20   Wt 77.8 kg   SpO2 96%   BMI 26.86 kg/m   Physical Exam Vitals and nursing note reviewed.  Constitutional:      General: He is not in acute distress.    Appearance: He is well-developed and well-nourished. He is ill-appearing. He is not diaphoretic.  HENT:     Head: Normocephalic and atraumatic.     Mouth/Throat:     Mouth: Oropharynx is clear and moist. Mucous membranes are dry.     Pharynx: Oropharynx is clear.  Eyes:     General:        Right eye: No discharge.        Left eye: No discharge.     Extraocular Movements: EOM normal.     Comments: Left gaze deviation  Cardiovascular:     Rate and Rhythm: Regular rhythm. Tachycardia present.     Pulses: Intact distal pulses.     Heart sounds: Normal heart sounds.  Pulmonary:     Effort: Pulmonary effort is normal. No respiratory distress.     Breath sounds: Normal breath sounds. No wheezing or rales.     Comments: Respirations equal and unlabored, patient able to speak in full sentences, lungs clear to auscultation bilaterally with some decreased breath sounds bilaterally Abdominal:     General: Bowel sounds are normal. There is no distension.     Palpations: Abdomen is soft. There is no mass.     Tenderness: There is no abdominal tenderness. There is no guarding.     Comments: Abdomen soft, nondistended, nontender to palpation in all quadrants without  guarding or peritoneal signs   Musculoskeletal:        General: No deformity or edema.     Cervical back: Neck supple.     Comments: No tenderness over the pelvis, no focal deformity or tenderness over the extremities.  Skin:    General: Skin is warm and dry.     Capillary Refill: Capillary refill takes less than 2 seconds.  Neurological:     Mental Status: He is alert.     Coordination: Coordination  normal.     Comments: Patient aphasic, when he tries to speak, nonsensical sounds are heard Left gaze preference Right facial droop 5/5 strength in the left upper and lower extremity. Some withdrawal noted in the right leg, minimal movement in the right upper extremity Sensation appears to be intact bilaterally.      ED Results / Procedures / Treatments   Labs (all labs ordered are listed, but only abnormal results are displayed) Labs Reviewed  PROTIME-INR - Abnormal; Notable for the following components:      Result Value   Prothrombin Time 15.9 (*)    INR 1.3 (*)    All other components within normal limits  I-STAT CHEM 8, ED - Abnormal; Notable for the following components:   Sodium 126 (*)    Potassium 2.8 (*)    Chloride 88 (*)    BUN 27 (*)    Glucose, Bld 522 (*)    Calcium, Ion 1.03 (*)    TCO2 19 (*)    All other components within normal limits  CBG MONITORING, ED - Abnormal; Notable for the following components:   Glucose-Capillary 466 (*)    All other components within normal limits  RESP PANEL BY RT-PCR (FLU A&B, COVID) ARPGX2  APTT  CBC  DIFFERENTIAL  COMPREHENSIVE METABOLIC PANEL    EKG EKG Interpretation  Date/Time:  Friday April 20 2020 04:39:27 EST Ventricular Rate:  132 PR Interval:    QRS Duration: 98 QT Interval:  307 QTC Calculation: 410 R Axis:   -6 Text Interpretation: Sinus tachycardia Atrial premature complexes Abnormal R-wave progression, early transition Interpretation limited secondary to artifact Abnormal ekg Confirmed by  Zadie Rhine ([id number]) on 04/13/2020 4:54:24 AM   Radiology CT Code Stroke CTA Head W/WO contrast  Result Date: 04/27/2020 CLINICAL DATA:  Stroke suspected.  Weakness and fall EXAM: CT ANGIOGRAPHY HEAD AND NECK CT PERFUSION BRAIN TECHNIQUE: Multidetector CT imaging of the head and neck was performed using the standard protocol during bolus administration of intravenous contrast. Multiplanar CT image reconstructions and MIPs were obtained to evaluate the vascular anatomy. Carotid stenosis measurements (when applicable) are obtained utilizing NASCET criteria, using the distal internal carotid diameter as the denominator. Multiphase CT imaging of the brain was performed following IV bolus contrast injection. Subsequent parametric perfusion maps were calculated using RAPID software. CONTRAST:  Dose is currently not known COMPARISON:  Head CT from earlier today. Brain MRI and MRA 07/19/2016 FINDINGS: CTA NECK FINDINGS Aortic arch: Mild atheromatous plaque with 3 vessel branching. Right carotid system: Atheromatous plaque which is primarily calcified and at the proximal ICA. No flow limiting stenosis or ulceration. Mildly medial and retropharyngeal course the ICA. Left carotid system: Atheromatous plaque which is greater than the right, primarily seen at the distal common carotid to proximal ICA with mixed density irregular plaque at the bifurcation. Proximal left ICA stenosis measures 60%. No dissection or generalized beading Vertebral arteries: Subclavian plaque bilaterally, greater on the right. Atheromatous plaque at both vertebral ostia. The vertebral arteries are mildly narrowed at the origins but are otherwise smooth and widely patent to the dura. Skeleton: No acute finding Other neck: Negative Upper chest: No acute finding Review of the MIP images confirms the above findings CTA HEAD FINDINGS Anterior circulation: Atheromatous plaque along both carotid siphons. Near trifurcation of the left MCA. The middle  branch is rapidly attenuated, a new finding from 2018. Negative for aneurysm or generalized beading. Posterior circulation: Small appearing vessels symmetric to the anterior circulation. The vertebral  and basilar arteries are smooth and widely patent. No cerebellar or PCA branch occlusion is seen. Negative for aneurysm or beading Venous sinuses: Negative in the arterial phase. Anatomic variants: None significant Review of the MIP images confirms the above findings CT Brain Perfusion Findings: ASPECTS: 10 CBF (<30%) Volume: 20mL Perfusion (Tmax>6.0s) volume: 75mL Mismatch Volume: 39mL Infarction Location:Left parietal Findings discussed with Dr. Amada Jupiter, who is already aware. IMPRESSION: 1. Left MCA branch occlusion when compared with 2018. Although at the M2 level, the vessel is smaller than the other M2 vessels. CT perfusion shows an ischemic area of 37 cc with core infarct of 21 cc. 2. Premature atherosclerosis for age. Most notably is a 60% proximal left ICA stenosis due to irregular plaque. Electronically Signed   By: Marnee Spring M.D.   On: 04/11/2020 04:45   CT Code Stroke CTA Neck W/WO contrast  Result Date: 04/03/2020 CLINICAL DATA:  Stroke suspected.  Weakness and fall EXAM: CT ANGIOGRAPHY HEAD AND NECK CT PERFUSION BRAIN TECHNIQUE: Multidetector CT imaging of the head and neck was performed using the standard protocol during bolus administration of intravenous contrast. Multiplanar CT image reconstructions and MIPs were obtained to evaluate the vascular anatomy. Carotid stenosis measurements (when applicable) are obtained utilizing NASCET criteria, using the distal internal carotid diameter as the denominator. Multiphase CT imaging of the brain was performed following IV bolus contrast injection. Subsequent parametric perfusion maps were calculated using RAPID software. CONTRAST:  Dose is currently not known COMPARISON:  Head CT from earlier today. Brain MRI and MRA 07/19/2016 FINDINGS: CTA NECK  FINDINGS Aortic arch: Mild atheromatous plaque with 3 vessel branching. Right carotid system: Atheromatous plaque which is primarily calcified and at the proximal ICA. No flow limiting stenosis or ulceration. Mildly medial and retropharyngeal course the ICA. Left carotid system: Atheromatous plaque which is greater than the right, primarily seen at the distal common carotid to proximal ICA with mixed density irregular plaque at the bifurcation. Proximal left ICA stenosis measures 60%. No dissection or generalized beading Vertebral arteries: Subclavian plaque bilaterally, greater on the right. Atheromatous plaque at both vertebral ostia. The vertebral arteries are mildly narrowed at the origins but are otherwise smooth and widely patent to the dura. Skeleton: No acute finding Other neck: Negative Upper chest: No acute finding Review of the MIP images confirms the above findings CTA HEAD FINDINGS Anterior circulation: Atheromatous plaque along both carotid siphons. Near trifurcation of the left MCA. The middle branch is rapidly attenuated, a new finding from 2018. Negative for aneurysm or generalized beading. Posterior circulation: Small appearing vessels symmetric to the anterior circulation. The vertebral and basilar arteries are smooth and widely patent. No cerebellar or PCA branch occlusion is seen. Negative for aneurysm or beading Venous sinuses: Negative in the arterial phase. Anatomic variants: None significant Review of the MIP images confirms the above findings CT Brain Perfusion Findings: ASPECTS: 10 CBF (<30%) Volume: 49mL Perfusion (Tmax>6.0s) volume: 65mL Mismatch Volume: 18mL Infarction Location:Left parietal Findings discussed with Dr. Amada Jupiter, who is already aware. IMPRESSION: 1. Left MCA branch occlusion when compared with 2018. Although at the M2 level, the vessel is smaller than the other M2 vessels. CT perfusion shows an ischemic area of 37 cc with core infarct of 21 cc. 2. Premature  atherosclerosis for age. Most notably is a 60% proximal left ICA stenosis due to irregular plaque. Electronically Signed   By: Marnee Spring M.D.   On: 04/25/2020 04:45   CT Code Stroke Cerebral Perfusion with contrast  Result Date: 04/08/2020 CLINICAL DATA:  Stroke suspected.  Weakness and fall EXAM: CT ANGIOGRAPHY HEAD AND NECK CT PERFUSION BRAIN TECHNIQUE: Multidetector CT imaging of the head and neck was performed using the standard protocol during bolus administration of intravenous contrast. Multiplanar CT image reconstructions and MIPs were obtained to evaluate the vascular anatomy. Carotid stenosis measurements (when applicable) are obtained utilizing NASCET criteria, using the distal internal carotid diameter as the denominator. Multiphase CT imaging of the brain was performed following IV bolus contrast injection. Subsequent parametric perfusion maps were calculated using RAPID software. CONTRAST:  Dose is currently not known COMPARISON:  Head CT from earlier today. Brain MRI and MRA 07/19/2016 FINDINGS: CTA NECK FINDINGS Aortic arch: Mild atheromatous plaque with 3 vessel branching. Right carotid system: Atheromatous plaque which is primarily calcified and at the proximal ICA. No flow limiting stenosis or ulceration. Mildly medial and retropharyngeal course the ICA. Left carotid system: Atheromatous plaque which is greater than the right, primarily seen at the distal common carotid to proximal ICA with mixed density irregular plaque at the bifurcation. Proximal left ICA stenosis measures 60%. No dissection or generalized beading Vertebral arteries: Subclavian plaque bilaterally, greater on the right. Atheromatous plaque at both vertebral ostia. The vertebral arteries are mildly narrowed at the origins but are otherwise smooth and widely patent to the dura. Skeleton: No acute finding Other neck: Negative Upper chest: No acute finding Review of the MIP images confirms the above findings CTA HEAD  FINDINGS Anterior circulation: Atheromatous plaque along both carotid siphons. Near trifurcation of the left MCA. The middle branch is rapidly attenuated, a new finding from 2018. Negative for aneurysm or generalized beading. Posterior circulation: Small appearing vessels symmetric to the anterior circulation. The vertebral and basilar arteries are smooth and widely patent. No cerebellar or PCA branch occlusion is seen. Negative for aneurysm or beading Venous sinuses: Negative in the arterial phase. Anatomic variants: None significant Review of the MIP images confirms the above findings CT Brain Perfusion Findings: ASPECTS: 10 CBF (<30%) Volume: 21mL Perfusion (Tmax>6.0s) volume: 37mL Mismatch Volume: 16mL Infarction Location:Left parietal Findings discussed with Dr. Amada Jupiter, who is already aware. IMPRESSION: 1. Left MCA branch occlusion when compared with 2018. Although at the M2 level, the vessel is smaller than the other M2 vessels. CT perfusion shows an ischemic area of 37 cc with core infarct of 21 cc. 2. Premature atherosclerosis for age. Most notably is a 60% proximal left ICA stenosis due to irregular plaque. Electronically Signed   By: Marnee Spring M.D.   On: 04/28/2020 04:45   CT HEAD CODE STROKE WO CONTRAST  Result Date: 04/05/2020 CLINICAL DATA:  Code stroke.  Left-sided weakness EXAM: CT HEAD WITHOUT CONTRAST TECHNIQUE: Contiguous axial images were obtained from the base of the skull through the vertex without intravenous contrast. COMPARISON:  07/19/2016 FINDINGS: Brain: Small volume subarachnoid hemorrhage at the right frontal pole. Reportedly there was a fall. No visible infarct, mass, or hydrocephalus. Vascular: No hyperdense vessel or unexpected calcification. Skull: Normal. Negative for fracture or focal lesion. Sinuses/Orbits: No acute finding. Other: Critical Value/emergent results were called by telephone at the time of interpretation on 04/26/2020 at 4:25 am to provider Zadie Rhine , who verbally acknowledged these results. ASPECTS [facility] Stroke Program Early CT Score) - Ganglionic level infarction (caudate, lentiform nuclei, internal capsule, insula, M1-M3 cortex): 7 - Supraganglionic infarction (M4-M6 cortex): 3 Total score (0-10 with 10 being normal): 10 IMPRESSION: 1. No visible infarct. 2. Small volume subarachnoid hemorrhage along the  right frontal convexity. Electronically Signed   By: Marnee Spring M.D.   On: 04/04/2020 04:26    Procedures .Critical Care Performed by: Dartha Lodge, PA-C Authorized by: Dartha Lodge, PA-C   Critical care provider statement:    Critical care time (minutes):  45   Critical care was necessary to treat or prevent imminent or life-threatening deterioration of the following conditions:  CNS failure or compromise   Critical care was time spent personally by me on the following activities:  Discussions with consultants, evaluation of patient's response to treatment, examination of patient, ordering and performing treatments and interventions, ordering and review of laboratory studies, ordering and review of radiographic studies, pulse oximetry, re-evaluation of patient's condition, obtaining history from patient or surrogate and review of old charts     Medications Ordered in ED Medications  sodium chloride flush (NS) 0.9 % injection 3 mL (3 mLs Intravenous Not Given 04/24/2020 0551)  potassium chloride 10 mEq in 100 mL IVPB (0 mEq Intravenous Stopped 04/04/2020 0651)  LORazepam (ATIVAN) injection 1 mg (has no administration in time range)  iohexol (OMNIPAQUE) 350 MG/ML injection 100 mL (100 mLs Intravenous Contrast Given 04/12/2020 0428)  sodium chloride 0.9 % bolus 1,000 mL (1,000 mLs Intravenous Bolus 04/28/2020 0550)    ED Course  I have reviewed the triage vital signs and the nursing notes.  Pertinent labs & imaging results that were available during my care of the patient were reviewed by me and considered in my medical  decision making (see chart for details).    MDM Rules/Calculators/A&P                         55 year old male arrives via EMS as code stroke he has obvious right-sided facial droop and weakness in the right upper and lower extremity with aphasia.  Last known well time 9 PM.  Evaluated with Dr. Amada Jupiter with neurology upon arrival.  TPA considered, but not given as patient is outside of window.  Exam concerning for left M1 stroke.  Airway intact.  Patient tachycardic, all other vitals stable.  CT head with no visible infarct.  Small volume subarachnoid hemorrhage along the right frontal convexity.  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  CTA with left MCA branch occlusion.  Unfortunately due to size of infarct patient is not a candidate for IR intervention.  Per Dr. Amada Jupiter with neurology patient will need medicine admission for stroke work-up and neurology will continue to follow.  Lab work with significant electrolyte derangements, glucose of 522 with sodium 124, potassium 2.8.  Will check magnesium.  IV fluids and IV potassium replacement ordered given aphasia patient cannot tolerate p.o. meds at this time.  Patient also with elevated creatinine and BUN, patient appears dehydrated on exam and wife reports poor p.o. intake and diarrhea.  Chest x-ray is unremarkable.  Given reported history of fall CT recon of the cervical spine obtained which shows no acute fracture or malalignment.  Covid screening is negative.  Patient is critically ill with stroke and multiple electrolyte derangements.  Will require hospital admission.  Case discussed with Dr. Toniann Fail with [facility] hospitalist who will see and admit the patient.  Final Clinical Impression(s) / ED Diagnoses Final diagnoses:  Cerebrovascular accident (CVA), unspecified mechanism (HCC)  Hyperglycemia  Hypokalemia    Rx / DC Orders ED  Discharge Orders  None       Dartha Lodge, PA-C 18-May-2020 0840    Dartha Lodge, PA-C 2020-05-18 1610    Zadie Rhine, MD 04/21/20 [phone]

## 2020-04-20 NOTE — H&P (Addendum)
History and Physical    Zachary Woods:[id number] DOB: 08-22-1965 DOA: 04/10/2020  Referring MD/NP/PA: Midge Minium, MD PCP: Shade Flood, MD  Patient coming from: Home via EMS  Chief Complaint: Right-sided weakness  I have personally briefly reviewed patient's old medical records in [facility]   HPI: Zachary Woods is a 55 y.o. male with medical history significant of hypertension, CVA without residual deficits, s/p PFO closure in 08/2016, diabetes mellitus type 2 and GERD who presents with right-sided weakness.  Patient was last known normal prior to going to bed around 9 PM.  History is obtained from patient's wife and is unable to provide adequate history at this time due to his current state.  His wife was woken up a little bit before 3 AM morning with the patient trying to get out of bed to use restroom.  However, the patient was unable to get up and slid out of bed to the ground.  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  He had stayed home from work yesterday as he complained of feeling feverish.  His wife came home later that day he had a fever.  He previously had a stroke back in 2018, but suffered no residual deficits and had closure of his PFO.  He had quit smoking cigarettes years ago and only drinks 1-3 beers a week.  In route with EMS patient was noted to have blood pressure 138/88, heart rate 120, respirations 18, O2 saturation 98% on room air, and CBG 520.  ED Course: Upon admission into the emergency department seen as a code stroke and was seen by neurology.  CT scan of the head without contrast showed a small volume subarachnoid hemorrhage without any acute signs of stroke.  He was not a TPA candidate given that he was out of the window and had acute bleed. Patient was noted to be afebrile, pulse elevated at 126, respiration 18-22, blood pressure 126/82-140/105, and O2 saturations  maintained on room air labs were significant for platelets of 74, sodium 124, potassium 2.8, chloride 89, BUN 27, creatinine 1.55, glucose 512, anion gap 16, magnesium 1.6, alkaline phosphatase 127, albumin 2.8, total bilirubin 1.7, and INR 1.3.  CTA/CTP significant for moderate sized MCA territory ischemic area.  COVID-19 and influenza screening were negative   patient was given Ativan 1 mg IV, potassium chloride 30 mEq, and 1 L normal saline IV fluids.  TRH called to admit.  Review of Systems  Unable to perform ROS: Medical condition  Constitutional: Positive for malaise/fatigue. Negative for fever.  Eyes: Negative for photophobia and pain.  Neurological: Positive for speech change and focal weakness.    Past Medical History:  Diagnosis Date  . Acid reflux   . CVA (cerebral vascular accident) (HCC)   . Diabetes mellitus, type 2 (HCC)   . Hypertension     Past Surgical History:  Procedure Laterality Date  . PATENT FORAMEN OVALE(PFO) CLOSURE N/A 08/01/2016   Procedure: Patent [place](PFO) Closure;  Surgeon: Tonny Bollman, MD;  Location[facility] INVASIVE CV LAB;  Service: Cardiovascular;  Laterality: N/A;  . TEE WITHOUT CARDIOVERSION N/A 07/21/2016   Procedure: TRANSESOPHAGEAL ECHOCARDIOGRAM (TEE);  Surgeon: Lars Masson, MD;  Location: [facility] ENDOSCOPY;  Service: Cardiovascular;  Laterality: N/A;     reports that he has quit smoking. He has never used smokeless tobacco. He reports current alcohol use of  about 1.0 standard drink of alcohol per week. He reports that he does not use drugs.  Allergies  Allergen Reactions  . Penicillins Itching    Has patient had a PCN reaction causing immediate rash, facial/tongue/throat swelling, SOB or lightheadedness with hypotension: No Has patient had a PCN reaction causing severe rash involving mucus membranes or skin necrosis: No Has patient had a PCN reaction that required hospitalization: No Has patient had a PCN reaction occurring within the  last 10 years: Yes If all of the above answers are "NO", then may proceed with Cephalosporin use.    Family History  Problem Relation Age of Onset  . Diabetes Father   . Diabetes Brother     Prior to Admission medications   Medication Sig Start Date End Date Taking? Authorizing Provider  amLODipine (NORVASC) 10 MG tablet Take 1 tablet (10 mg total) by mouth daily. 02/04/18  Yes Shade Flood, MD  aspirin EC 81 MG tablet Take 1 tablet (81 mg total) by mouth daily. 08/05/17  Yes Janetta Hora, PA-C  ibuprofen (ADVIL) 200 MG tablet Take 400 mg by mouth every 6 (six) hours as needed for headache or mild pain.   Yes [provider]  meloxicam (MOBIC) 7.5 MG tablet Take 7.5 mg by mouth daily. 06/17/19  Yes [provider]  metFORMIN (GLUCOPHAGE) 500 MG tablet Take 1 tablet (500 mg total) by mouth daily with breakfast. 08/04/19  Yes Shade Flood, MD  Multiple Vitamin (MULTIVITAMIN WITH MINERALS) TABS tablet Take 1 tablet by mouth daily.   Yes [provider]  omeprazole (PRILOSEC OTC) 20 MG tablet Take 20 mg by mouth daily.   Yes [provider]  pantoprazole (PROTONIX) 40 MG tablet TAKE 1 TABLET BY MOUTH EVERY DAY Patient taking differently: Take 40 mg by mouth daily. 03/25/17  Yes Tonny Bollman, MD  sildenafil (VIAGRA) 100 MG tablet Take 0.5-1 tablets (50-100 mg total) by mouth daily as needed for erectile dysfunction. 11/27/16  Yes Shade Flood, MD  Tetrahydrozoline HCl (VISINE OP) Place 1 drop into both eyes daily as needed (dry eyes).   Yes [provider]  hydrochlorothiazide (MICROZIDE) 12.5 MG capsule TAKE 1 CAPSULE BY MOUTH EVERY DAY Patient not taking: No sig reported 06/23/18   Shade Flood, MD    Physical Exam:  Constitutional: Middle-age male who appears to be in some distress Vitals:   04/28/2020 0410 04/14/2020 0445 04/27/2020 0500 04/18/2020 0515  BP: (!) 140/105 126/80 (!) 135/100 127/90  Pulse: (!) 46 (!) 41 (!) 126 69   Resp:  18 (!) 21 20  Temp:  97.7 F (36.5 C)    TempSrc:  Oral    SpO2: 95% 96% 96% 96%  Weight:       Eyes: Left gaze preference with reactive pupils. ENMT: Mucous membranes are dry. Posterior pharynx clear of any exudate or lesions.  Neck: normal, supple, no masses, no thyromegaly Respiratory: clear to auscultation bilaterally, no wheezing, no crackles. Normal respiratory effort. No accessory muscle use.  Cardiovascular: Tachycardic, no murmurs / rubs / gallops. No extremity edema. 2+ pedal pulses. No carotid bruits.  Abdomen: no tenderness, no masses palpated. No hepatosplenomegaly. Bowel sounds positive.  Musculoskeletal: no clubbing / cyanosis. No joint deformity upper and lower extremities. Good ROM, no contractures. Normal muscle tone.  Skin: Patient has multiple tattoos present chest and arms Neurologic: Right-sided facial weakness with aphasia.  Strength 5/5 in left upper and lower.  Right lower extremity appears to be  at least 3/5.  Right upper extremity 2/5 strength as he is able to move his right hand some to attempt to grip, but not able to significantly against gravity Psychiatric: Altered    Labs on Admission: I have personally reviewed following labs and imaging studies  CBC: Recent Labs  Lab 04/25/2020 0416 04/11/2020 0424  WBC 6.7  --   NEUTROABS 6.1  --   HGB 15.4 15.6  HCT 42.0 46.0  MCV 93.5  --   PLT 74*  --    Basic Metabolic Panel: Recent Labs  Lab 04/13/2020 0416 04/26/2020 0424  NA 124* 126*  K 2.8* 2.8*  CL 89* 88*  CO2 19*  --   GLUCOSE 512* 522*  BUN 27* 27*  CREATININE 1.55* 1.20  CALCIUM 8.3*  --   MG 1.6*  --    GFR: CrCl cannot be calculated (Unknown ideal weight.). Liver Function Tests: Recent Labs  Lab 04/29/2020 0416  AST 40  ALT 23  ALKPHOS 127*  BILITOT 1.7*  PROT 6.2*  ALBUMIN 2.8*   No results for input(s): LIPASE, AMYLASE in the last 168 hours. No results for input(s): AMMONIA in the last 168 hours. Coagulation  Profile: Recent Labs  Lab 04/16/2020 0416  INR 1.3*   Cardiac Enzymes: No results for input(s): CKTOTAL, CKMB, CKMBINDEX, TROPONINI in the last 168 hours. BNP (last 3 results) No results for input(s): PROBNP in the last 8760 hours. HbA1C: No results for input(s): HGBA1C in the last 72 hours. CBG: Recent Labs  Lab 04/09/2020 0408  GLUCAP 466*   Lipid Profile: No results for input(s): CHOL, HDL, LDLCALC, TRIG, CHOLHDL, LDLDIRECT in the last 72 hours. Thyroid Function Tests: No results for input(s): TSH, T4TOTAL, FREET4, T3FREE, THYROIDAB in the last 72 hours. Anemia Panel: No results for input(s): VITAMINB12, FOLATE, FERRITIN, TIBC, IRON, RETICCTPCT in the last 72 hours. Urine analysis: No results found for: COLORURINE, APPEARANCEUR, LABSPEC, PHURINE, GLUCOSEU, HGBUR, BILIRUBINUR, KETONESUR, PROTEINUR, UROBILINOGEN, NITRITE, LEUKOCYTESUR Sepsis Labs: Recent Results (from the past 240 hour(s))  Resp Panel by RT-PCR (Flu A&B, Covid) Nasopharyngeal Swab     Status: None   Collection Time: 04/24/2020  4:20 AM   Specimen: Nasopharyngeal Swab; Nasopharyngeal(NP) swabs in vial transport medium  Result Value Ref Range Status   SARS Coronavirus 2 by RT PCR NEGATIVE NEGATIVE Final    Comment: (NOTE) SARS-CoV-2 target nucleic acids are NOT DETECTED.  The SARS-CoV-2 RNA is generally detectable in upper respiratory specimens during the acute phase of infection. The lowest concentration of SARS-CoV-2 viral copies this assay can detect is 138 copies/mL. A negative result does not preclude SARS-Cov-2 infection and should not be used as the sole basis for treatment or other patient management decisions. A negative result may occur with  improper specimen collection/handling, submission of specimen other than nasopharyngeal swab, presence of viral mutation(s) within the areas targeted by this assay, and inadequate number of viral copies(<138 copies/mL). A negative result must be combined  with clinical observations, patient history, and epidemiological information. The expected result is Negative.  Fact Sheet for Patients:  BloggerCourse.com  Fact Sheet for Healthcare Providers:  SeriousBroker.it  This test is no t yet approved or cleared by the [place] FDA and  has been authorized for detection and/or diagnosis of SARS-CoV-2 by FDA under an Emergency Use Authorization (EUA). This EUA will remain  in effect (meaning this test can be used) for the duration of the COVID-19 declaration under Section 564(b)(1) of the Act, 21 U.S.C.section 360bbb-3(b)(1),  unless the authorization is terminated  or revoked sooner.       Influenza A by PCR NEGATIVE NEGATIVE Final   Influenza B by PCR NEGATIVE NEGATIVE Final    Comment: (NOTE) The Xpert Xpress SARS-CoV-2/FLU/RSV plus assay is intended as an aid in the diagnosis of influenza from Nasopharyngeal swab specimens and should not be used as a sole basis for treatment. Nasal washings and aspirates are unacceptable for Xpert Xpress SARS-CoV-2/FLU/RSV testing.  Fact Sheet for Patients: BloggerCourse.com  Fact Sheet for Healthcare Providers: SeriousBroker.it  This test is not yet approved or cleared by the [place] FDA and has been authorized for detection and/or diagnosis of SARS-CoV-2 by FDA under an Emergency Use Authorization (EUA). This EUA will remain in effect (meaning this test can be used) for the duration of the COVID-19 declaration under Section 564(b)(1) of the Act, 21 U.S.C. section 360bbb-3(b)(1), unless the authorization is terminated or revoked.  Performed at [facility], [address] [place], [place] [address]      Radiological Exams on Admission: CT Code Stroke CTA Head W/WO contrast  Result Date: 04/28/2020 CLINICAL DATA:  Stroke suspected.  Weakness and fall EXAM: CT ANGIOGRAPHY  HEAD AND NECK CT PERFUSION BRAIN TECHNIQUE: Multidetector CT imaging of the head and neck was performed using the standard protocol during bolus administration of intravenous contrast. Multiplanar CT image reconstructions and MIPs were obtained to evaluate the vascular anatomy. Carotid stenosis measurements (when applicable) are obtained utilizing NASCET criteria, using the distal internal carotid diameter as the denominator. Multiphase CT imaging of the brain was performed following IV bolus contrast injection. Subsequent parametric perfusion maps were calculated using RAPID software. CONTRAST:  Dose is currently not known COMPARISON:  Head CT from earlier today. Brain MRI and MRA 07/19/2016 FINDINGS: CTA NECK FINDINGS Aortic arch: Mild atheromatous plaque with 3 vessel branching. Right carotid system: Atheromatous plaque which is primarily calcified and at the proximal ICA. No flow limiting stenosis or ulceration. Mildly medial and retropharyngeal course the ICA. Left carotid system: Atheromatous plaque which is greater than the right, primarily seen at the distal common carotid to proximal ICA with mixed density irregular plaque at the bifurcation. Proximal left ICA stenosis measures 60%. No dissection or generalized beading Vertebral arteries: Subclavian plaque bilaterally, greater on the right. Atheromatous plaque at both vertebral ostia. The vertebral arteries are mildly narrowed at the origins but are otherwise smooth and widely patent to the dura. Skeleton: No acute finding Other neck: Negative Upper chest: No acute finding Review of the MIP images confirms the above findings CTA HEAD FINDINGS Anterior circulation: Atheromatous plaque along both carotid siphons. Near trifurcation of the left MCA. The middle branch is rapidly attenuated, a new finding from 2018. Negative for aneurysm or generalized beading. Posterior circulation: Small appearing vessels symmetric to the anterior circulation. The vertebral and  basilar arteries are smooth and widely patent. No cerebellar or PCA branch occlusion is seen. Negative for aneurysm or beading Venous sinuses: Negative in the arterial phase. Anatomic variants: None significant Review of the MIP images confirms the above findings CT Brain Perfusion Findings: ASPECTS: 10 CBF (<30%) Volume: 109mL Perfusion (Tmax>6.0s) volume: 75mL Mismatch Volume: 63mL Infarction Location:Left parietal Findings discussed with Dr. Amada Jupiter, who is already aware. IMPRESSION: 1. Left MCA branch occlusion when compared with 2018. Although at the M2 level, the vessel is smaller than the other M2 vessels. CT perfusion shows an ischemic area of 37 cc with core infarct of 21 cc. 2. Premature atherosclerosis  for age. Most notably is a 60% proximal left ICA stenosis due to irregular plaque. Electronically Signed   By: Marnee SpringJonathon  Watts M.D.   On: 08-12-20 04:45   CT Code Stroke CTA Neck W/WO contrast  Result Date: 01-Feb-2021 CLINICAL DATA:  Stroke suspected.  Weakness and fall EXAM: CT ANGIOGRAPHY HEAD AND NECK CT PERFUSION BRAIN TECHNIQUE: Multidetector CT imaging of the head and neck was performed using the standard protocol during bolus administration of intravenous contrast. Multiplanar CT image reconstructions and MIPs were obtained to evaluate the vascular anatomy. Carotid stenosis measurements (when applicable) are obtained utilizing NASCET criteria, using the distal internal carotid diameter as the denominator. Multiphase CT imaging of the brain was performed following IV bolus contrast injection. Subsequent parametric perfusion maps were calculated using RAPID software. CONTRAST:  Dose is currently not known COMPARISON:  Head CT from earlier today. Brain MRI and MRA 07/19/2016 FINDINGS: CTA NECK FINDINGS Aortic arch: Mild atheromatous plaque with 3 vessel branching. Right carotid system: Atheromatous plaque which is primarily calcified and at the proximal ICA. No flow limiting stenosis or  ulceration. Mildly medial and retropharyngeal course the ICA. Left carotid system: Atheromatous plaque which is greater than the right, primarily seen at the distal common carotid to proximal ICA with mixed density irregular plaque at the bifurcation. Proximal left ICA stenosis measures 60%. No dissection or generalized beading Vertebral arteries: Subclavian plaque bilaterally, greater on the right. Atheromatous plaque at both vertebral ostia. The vertebral arteries are mildly narrowed at the origins but are otherwise smooth and widely patent to the dura. Skeleton: No acute finding Other neck: Negative Upper chest: No acute finding Review of the MIP images confirms the above findings CTA HEAD FINDINGS Anterior circulation: Atheromatous plaque along both carotid siphons. Near trifurcation of the left MCA. The middle branch is rapidly attenuated, a new finding from 2018. Negative for aneurysm or generalized beading. Posterior circulation: Small appearing vessels symmetric to the anterior circulation. The vertebral and basilar arteries are smooth and widely patent. No cerebellar or PCA branch occlusion is seen. Negative for aneurysm or beading Venous sinuses: Negative in the arterial phase. Anatomic variants: None significant Review of the MIP images confirms the above findings CT Brain Perfusion Findings: ASPECTS: 10 CBF (<30%) Volume: 21mL Perfusion (Tmax>6.0s) volume: 37mL Mismatch Volume: 16mL Infarction Location:Left parietal Findings discussed with Dr. Amada JupiterKirkpatrick, who is already aware. IMPRESSION: 1. Left MCA branch occlusion when compared with 2018. Although at the M2 level, the vessel is smaller than the other M2 vessels. CT perfusion shows an ischemic area of 37 cc with core infarct of 21 cc. 2. Premature atherosclerosis for age. Most notably is a 60% proximal left ICA stenosis due to irregular plaque. Electronically Signed   By: Marnee SpringJonathon  Watts M.D.   On: 08-12-20 04:45   CT C-SPINE NO CHARGE  Result  Date: 01-Feb-2021 CLINICAL DATA:  Status post ground level fall. Right-sided facial droop, right arm/leg weakness, slurred. EXAM: CT CERVICAL SPINE WITHOUT CONTRAST TECHNIQUE: Multidetector CT imaging of the cervical spine was performed without intravenous contrast. Multiplanar CT image reconstructions were also generated. COMPARISON:  None. FINDINGS: Alignment: Normal. Skull base and vertebrae: Multilevel degenerative changes of the spine. No acute fracture. No aggressive appearing focal osseous lesion or focal pathologic process. Soft tissues and spinal canal: No prevertebral fluid or swelling. No visible canal hematoma. Upper chest: Unremarkable. Other: Please see separately dictated CT angio head/neck. IMPRESSION: 1. No acute displaced fracture or traumatic listhesis of the cervical spine. 2. Please see separately  dictated CT angio head/neck. Electronically Signed   By: Tish Frederickson M.D.   On: May 07, 2020 05:29   CT Code Stroke Cerebral Perfusion with contrast  Result Date: 05/07/2020 CLINICAL DATA:  Stroke suspected.  Weakness and fall EXAM: CT ANGIOGRAPHY HEAD AND NECK CT PERFUSION BRAIN TECHNIQUE: Multidetector CT imaging of the head and neck was performed using the standard protocol during bolus administration of intravenous contrast. Multiplanar CT image reconstructions and MIPs were obtained to evaluate the vascular anatomy. Carotid stenosis measurements (when applicable) are obtained utilizing NASCET criteria, using the distal internal carotid diameter as the denominator. Multiphase CT imaging of the brain was performed following IV bolus contrast injection. Subsequent parametric perfusion maps were calculated using RAPID software. CONTRAST:  Dose is currently not known COMPARISON:  Head CT from earlier today. Brain MRI and MRA 07/19/2016 FINDINGS: CTA NECK FINDINGS Aortic arch: Mild atheromatous plaque with 3 vessel branching. Right carotid system: Atheromatous plaque which is primarily calcified  and at the proximal ICA. No flow limiting stenosis or ulceration. Mildly medial and retropharyngeal course the ICA. Left carotid system: Atheromatous plaque which is greater than the right, primarily seen at the distal common carotid to proximal ICA with mixed density irregular plaque at the bifurcation. Proximal left ICA stenosis measures 60%. No dissection or generalized beading Vertebral arteries: Subclavian plaque bilaterally, greater on the right. Atheromatous plaque at both vertebral ostia. The vertebral arteries are mildly narrowed at the origins but are otherwise smooth and widely patent to the dura. Skeleton: No acute finding Other neck: Negative Upper chest: No acute finding Review of the MIP images confirms the above findings CTA HEAD FINDINGS Anterior circulation: Atheromatous plaque along both carotid siphons. Near trifurcation of the left MCA. The middle branch is rapidly attenuated, a new finding from 2018. Negative for aneurysm or generalized beading. Posterior circulation: Small appearing vessels symmetric to the anterior circulation. The vertebral and basilar arteries are smooth and widely patent. No cerebellar or PCA branch occlusion is seen. Negative for aneurysm or beading Venous sinuses: Negative in the arterial phase. Anatomic variants: None significant Review of the MIP images confirms the above findings CT Brain Perfusion Findings: ASPECTS: 10 CBF (<30%) Volume: 21mL Perfusion (Tmax>6.0s) volume: 37mL Mismatch Volume: 16mL Infarction Location:Left parietal Findings discussed with Dr. Amada Jupiter, who is already aware. IMPRESSION: 1. Left MCA branch occlusion when compared with 2018. Although at the M2 level, the vessel is smaller than the other M2 vessels. CT perfusion shows an ischemic area of 37 cc with core infarct of 21 cc. 2. Premature atherosclerosis for age. Most notably is a 60% proximal left ICA stenosis due to irregular plaque. Electronically Signed   By: Marnee Spring M.D.   On:  05-07-20 04:45   DG Chest Port 1 View  Result Date: 2020-05-07 CLINICAL DATA:  Fall, code stroke EXAM: PORTABLE CHEST 1 VIEW COMPARISON:  07/19/2016 FINDINGS: Low lung volumes. Heart and mediastinal contours are within normal limits. No focal opacities or effusions. No acute bony abnormality. IMPRESSION: Low volumes.  No active cardiopulmonary disease. Electronically Signed   By: Charlett Nose M.D.   On: 07-May-2020 05:17   CT HEAD CODE STROKE WO CONTRAST  Result Date: 07-May-2020 CLINICAL DATA:  Code stroke.  Left-sided weakness EXAM: CT HEAD WITHOUT CONTRAST TECHNIQUE: Contiguous axial images were obtained from the base of the skull through the vertex without intravenous contrast. COMPARISON:  07/19/2016 FINDINGS: Brain: Small volume subarachnoid hemorrhage at the right frontal pole. Reportedly there was a fall. No visible infarct,  mass, or hydrocephalus. Vascular: No hyperdense vessel or unexpected calcification. Skull: Normal. Negative for fracture or focal lesion. Sinuses/Orbits: No acute finding. Other: Critical Value/emergent results were called by telephone at the time of interpretation on 05/13/20 at 4:25 am to provider Zadie Rhine , who verbally acknowledged these results. ASPECTS [facility] Stroke Program Early CT Score) - Ganglionic level infarction (caudate, lentiform nuclei, internal capsule, insula, M1-M3 cortex): 7 - Supraganglionic infarction (M4-M6 cortex): 3 Total score (0-10 with 10 being normal): 10 IMPRESSION: 1. No visible infarct. 2. Small volume subarachnoid hemorrhage along the right frontal convexity. Electronically Signed   By: Marnee Spring M.D.   On: May 13, 2020 04:26    EKG: Independently reviewed.  Tachycardia 132 bpm with premature atrial complexes  Assessment/Plan Acute cerebrovascular accident: Patient presents after being found by wife with right-sided weakness.  CT/CTA/CTP significant for left MCA branch occlusion and a small volume subarachnoid hemorrhage.   Patient was not a TPA candidate as he was out of the window and had presence of a subarachnoid hemorrhage.  Neurology had evaluated patient who recommended completion of stroke work-up. -Admit to a progressive bed  -Stroke order set initiated -Neuro checks -N.p.o. with aspiration precautions until able to pass swallow evaluation -Add-on Hemoglobin A1c and lipid panel in a.m. -Check urine drug screen -Check  MRI brain w/o contrast and carotid doppler vas  U/S -Check echocardiogram -Recommended to start aspirin 325 mg daily if SAH appears stable on MRI -PT/OT/Speech to evaluate and treat -Transitions of care consult -Appreciate neurology consultative services, will follow-up for further recommendation  Subarachnoid hemorrhage: As seen on CT imaging.  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 -Neurochecks -Will formally consult neurosurgery if signs of worsening bleed on MRI or worsening neuro status  Diabetes mellitus type 2 with hyperosmolar hyperglycemic state: Acute.  Patient presents with glucose 512, CO2 19, and anion gap initially 16.  Home medication regimen only included Metformin 500 mg daily.  Patient appeared to be uncontrolled as last hemoglobin A1c was 8.2 on 08/04/2019. Suspecting HHS more likely DKA at this time. -Hold Metformin -Follow-up hemoglobin A1c -Check urinalysis -Recheck BMP 12 PM -Normal saline at 100 mL/h -CBGs every 4 hours with moderate SSI -Adjust insulin as needed -Diabetic education  Acute kidney injury: Patient presents with creatinine elevated to 1.55 with BUN 27.  Baseline creatinine previously have been around 1 in 08/2019.  Suspect secondary to dehydration related with diabetes type 2. -Strict intake and output -Follow-up urinalysis -Avoid nephrotoxic agents -IV fluids as seen above -Continue to monitor kidney function  SIRS: Acute.  Patient was noted to be tachycardic and mildly tachypneic on  admission.  He was otherwise afebrile with white blood cell count within normal limits. Chest x-ray was otherwise noted to be clear.  Suspect this is reactive in nature. -Follow-up urine -Add on lactic acid -Would recommend checking blood cultures if patient spikes fever  Hypokalemia and hypomagnesemia: Acute.  On admission potassium noted to be 2.8 and magnesium 1.6.  Patient had been ordered potassium chloride 30 mEq  IV which would possibly correct potassium to 3.1. -Give magnesium sulfate 1 g IV -Add potassium chloride 20 mEq to IVFs -Continue to monitor and replace as needed   Metabolic acidosis with elevated anion gap: Acute.  Initial anion gap 16 with CO2 19.  Unclear cause at this time. -Continue to monitor  Hyponatremia and hypochloremia: Acute.  Sodium 124 with chloride 89 on admission.  Even when correcting sodium for hyperglycemia levels were still noted to be mildly low at 134.  Suspect dehydration as likely cause given symptoms. -Treatment as seen above  Thrombocytopenia: Acute.  Platelet count 74 on admission, but previously had been within normal limits.  Wife reports that patient drinks alcohol, but only drinks approximately 1-3 beers a week and not on a daily basis. -Continue to monitor  Hyperbilirubinemia, elevated alkaline phosphatase: Acute.  Alkaline phosphatase 127 and total bilirubin 1.7.  Records note elevated AST and ALT previously in the past, but within normal limits today -Add-on acute hepatitis panel -Check CMP with direct bilirubin and direct bilirubin -May benefit from right upper quadrant ultrasound once patient's acute issues are stable or in the outpatient setting   Hypoalbuminemia: Acute. Albumin noted to be 2.8. -Check prealbumin in a.m. to quantify degree of protein calorie malnutrition  DVT prophylaxis: SCDs Code Status: Full Family Communication: Wife updated at bedside Disposition Plan: Likely need of rehab Consults called: Neurology Admission  status: Inpatient, require more than 2 midnight stay due to patient being found with an acute left MCA stroke with significant deficits  Clydie Braun MD [facility]   If 7PM-7AM, please contact night-coverage   04/11/2020, 7:17 AM

## 2020-04-20 NOTE — Consult Note (Signed)
Neurology Consult  CC: Right sided weakness  History is obtained from:patient  HPI: Zachary Woods is a 55 y.o. male with a history of previous stroke s/p PFO closure 08/01/2016 who was in his normal state when he went to bed around 9 PM.  He started to try to get out of bed and slid down the side of the bed. The wife denies significant fall. He was brought in as a code stroke and was taken for emergent evaluation with CT/CTA/CTP.  This demonstrated a moderate sized MCA territory ischemic area with the majority having already infarcted.    LKW: 9 pm  tpa given?: No, out of window.  IR Thrombectomy? No, CTP not amenable Modified Rankin Scale: 0-Completely asymptomatic and back to baseline post- stroke NIHSS: 23   ROS: Unable to obtain due to altered mental status.   Past Medical History:  Diagnosis Date  . Acid reflux   . CVA (cerebral vascular accident) (HCC)   . Hypertension      Family History  Problem Relation Age of Onset  . Diabetes Father   . Diabetes Brother      Social History:  reports that he has quit smoking. He has never used smokeless tobacco. He reports current alcohol use of about 1.0 standard drink of alcohol per week. He reports that he does not use drugs.   Prior to Admission medications   Medication Sig Start Date End Date Taking? Authorizing Provider  amLODipine (NORVASC) 10 MG tablet Take 1 tablet (10 mg total) by mouth daily. 02/04/18   Shade Flood, MD  aspirin EC 81 MG tablet Take 1 tablet (81 mg total) by mouth daily. 08/05/17   Janetta Hora, PA-C  hydrochlorothiazide (MICROZIDE) 12.5 MG capsule TAKE 1 CAPSULE BY MOUTH EVERY DAY Patient not taking: Reported on 08/04/2019 06/23/18   Shade Flood, MD  Ibuprofen-Famotidine (DUEXIS) 800-26.6 MG TABS Take by mouth.    [provider]  meloxicam (MOBIC) 7.5 MG tablet Take 7.5 mg by mouth daily. 06/17/19   [provider]  metFORMIN (GLUCOPHAGE) 500 MG tablet Take 1 tablet (500  mg total) by mouth daily with breakfast. 08/04/19   Shade Flood, MD  Multiple Vitamin (MULTIVITAMIN WITH MINERALS) TABS tablet Take 1 tablet by mouth daily.    [provider]  pantoprazole (PROTONIX) 40 MG tablet TAKE 1 TABLET BY MOUTH EVERY DAY 03/25/17   Tonny Bollman, MD  sildenafil (VIAGRA) 100 MG tablet Take 0.5-1 tablets (50-100 mg total) by mouth daily as needed for erectile dysfunction. 11/27/16   Shade Flood, MD  sulfamethoxazole-trimethoprim (BACTRIM DS) 800-160 MG tablet Take 1 tablet by mouth 2 (two) times daily. 08/02/19   [provider]  triamcinolone cream (KENALOG) 0.1 % Apply 1 application topically 2 (two) times daily. 08/04/19   Shade Flood, MD     Exam: Current vital signs: BP 127/90   Pulse 69   Temp 97.7 F (36.5 C) (Oral)   Resp 20   Wt 77.8 kg   SpO2 96%   BMI 26.86 kg/m    Physical Exam  Constitutional: Appears well-developed and well-nourished.  Psych: Affect appropriate to situation Eyes: No scleral injection HENT: No OP obstrucion Head: Normocephalic.  Cardiovascular: Normal rate and regular rhythm.  Respiratory: Effort normal and breath sounds normal to anterior ascultation GI: Soft.  No distension. There is no tenderness.  Skin: WDI  Neuro: Mental Status: Patient is awake, alert,  He has significant aphasia.  He is  able to make a fist, but does not reliably follow commands. Cranial Nerves: II: Right hemianopia pupils are equal, round, and reactive to light.   III,IV, VI: He is a left gaze preference V: Facial sensation is symmetric to temperature VII: Facial movement with right facial weakness VIII: hearing is intact to voice X: Uvula elevates symmetrically XI: Shoulder shrug is symmetric. XII: tongue is midline without atrophy or fasciculations.  Motor: Tone is normal. Bulk is normal. 5/5 strength was present on the left, some withdrawal in the right leg, minimal movement right arm.  Sensory: Sensation is  symmetric to light touch and temperature in the arms and legs. Cerebellar: FNF and HKS are intact bilaterally  I have reviewed labs in epic and the pertinent results are: Na 124 Glucose 512 K 2.8 Cr 1.55  I have reviewed the images obtained: CT - small single sulcal SAH CTA- occlusion of a trifurcation branch of the left MCA CTP - Ischemic area 37cc, ischemic core 21ml by CBF<30%, CBV - 28ml  Primary Diagnosis:  Cerebral infarction due to embolism of  left middle cerebral artery.   Secondary Diagnosis: Essential (primary) hypertension, Type 2 diabetes mellitus with hyperglycemia , Acute Kidney Failure and Hypokalemia   Impression: 55 yo M with acute left MCA branch occlusion. Unfortunately, the area of infarct is already significant, and therefore I think that revascularization would be significant risk of hemorrhage for marginal gain. His small SAH I think is likely incidental. He bumps his head frequently at work and this is a possible etiology, but did not have a significant trauma tonight per his wife. This is unlikely to cause significant problems, and therefore as long as stable on imaging in a few hours, I would not hold antiplatelets.   Plan: - HgbA1c, fasting lipid panel - MRI the brain without contrast - Frequent neuro checks - Echocardiogram - Carotid dopplers - Prophylactic therapy-would ensure stability of SAH on MRI, then could start ASA 325mg daily.  - Risk factor modification - Telemetry monitoring - PT consult, OT consult, Speech consult - Stroke team to follow    This patient is critically ill and at significant risk of neurological worsening, death and care requires constant monitoring of vital signs, hemodynamics,respiratory and cardiac monitoring, neurological assessment, discussion with family, other specialists and medical decision making of high complexity. I spent 60 minutes of neurocritical care time  in the care of  this patient. This was time spent  independent of any time provided by nurse practitioner or PA.  Amadea Keagy, MD [facility] [phone]  If 7pm- 7am, please page neurology on call as listed in AMION.   

## 2020-04-20 NOTE — Procedures (Signed)
Patient Name: Zachary Woods  MRN: [id number]  Epilepsy Attending: Charlsie Quest  Referring Physician/Provider: Shon Hale, NP Date: 04/22/2020 Duration: 29.18 mins  Patient history: 55 y.o. male with history of previous cryptogenic Left MCA stroke s/p PFO closure, lost to stroke follow up 2018, DM2, HTN, GERD presenting with aphasia, right facial droop and right sided weakness noted upon waking up at 0300 this morning. EEG to evaluate for seizure  Level of alertness: lethargic  AEDs during EEG study:   Technical aspects: This EEG study was done with scalp electrodes positioned according to the 10-20 International system of electrode placement. Electrical activity was acquired at a sampling rate of 500Hz  and reviewed with a high frequency filter of 70Hz  and a low frequency filter of 1Hz . EEG data were recorded continuously and digitally stored.   Description: EEG showed continuous generalized 3 to 6 Hz theta-delta slowing. Hyperventilation and photic stimulation were not performed.     ABNORMALITY -Continuous slow, generalized  IMPRESSION: This study is suggestive of moderate diffuse encephalopathy, nonspecific etiology. No seizures or epileptiform discharges were seen throughout the recording.  Boden Stucky 

## 2020-04-20 NOTE — Progress Notes (Signed)
    [facility] has been requested to perform a transesophageal echocardiogram on Zachary Woods for Stroke.  After careful review of history and examination, the risks and benefits of transesophageal echocardiogram have been explained including risks of esophageal damage, perforation (1:10,000 risk), bleeding, pharyngeal hematoma as well as other potential complications associated with conscious sedation including aspiration, arrhythmia, respiratory failure and death. Alternatives to treatment were discussed, questions were answered.    Patient is lethargic after ativan in ER. Risk and benefits discussed with wife at bedside who is willing to proceed. Call cardiology over weekend if any questions.   TEE - Dr. Izora Ribas 04/04/2020 @ 1230 . NPO after midnight. Meds with sips.   Manson Passey, PA-C 04/14/2020 4:02 PM

## 2020-04-20 NOTE — ED Notes (Signed)
This RN received a call from MRI that pt was all over the place. Dr. Katrinka Blazing made aware and ordered 0.5 of ativan. Will run to MRI and give to pt.

## 2020-04-20 NOTE — Progress Notes (Addendum)
STROKE TEAM PROGRESS NOTE   INTERVAL HISTORY Arrived as Code stroke around 0430 after awakening around 0300 unable to speak with right sided weakness and right facial droop and blood glucose 512.  Patient ill appearing  with altered mental state, severe aphasia, minimal awareness. Does not attend to examiners or conversation. Unable to obtain ROS.  Patient appears tachycardic with low-grade fever.  Serum sodium and potassium are low His wife is at bedside. Stroke diagnosis, work up and plan of care explained. Questions answered.   Vitals:   04/13/2020 1126 04/06/2020 1230 04/06/2020 1300 04/22/2020 1430  BP:  127/89 126/87 118/83  Pulse:  (!) 122 (!) 121 (!) 122  Resp:  (!) 26 (!) 41 (!) 26  Temp: (!) 100.7 F (38.2 C)     TempSrc: Rectal     SpO2:  100% 98% 100%  Weight:       CBC:  Recent Labs  Lab 04/08/2020 0416 04/22/2020 0424  WBC 6.7  --   NEUTROABS 6.1  --   HGB 15.4 15.6  HCT 42.0 46.0  MCV 93.5  --   PLT 74*  --    Basic Metabolic Panel:  Recent Labs  Lab 04/06/2020 0416 04/19/2020 0424 04/14/2020 1217  NA 124* 126* 126*  K 2.8* 2.8* 2.9*  CL 89* 88* 94*  CO2 19*  --  16*  GLUCOSE 512* 522* 416*  BUN 27* 27* 23*  CREATININE 1.55* 1.20 1.53*  CALCIUM 8.3*  --  8.0*  MG 1.6*  --   --    Lipid Panel:  Recent Labs  Lab 04/12/2020 1217  CHOL 98  TRIG 327*  HDL <10*  CHOLHDL NOT CALCULATED  VLDL 65*  LDLCALC NOT CALCULATED   HgbA1c:  Recent Labs  Lab 04/07/2020 1217  HGBA1C 10.5*   Urine Drug Screen: No results for input(s): LABOPIA, COCAINSCRNUR, LABBENZ, AMPHETMU, THCU, LABBARB in the last 168 hours.  Alcohol Level No results for input(s): ETH in the last 168 hours.  IMAGING past 24 hours CT Code Stroke CTA Head W/WO contrast  Result Date: 04/25/2020 CLINICAL DATA:  Stroke suspected.  Weakness and fall EXAM: CT ANGIOGRAPHY HEAD AND NECK CT PERFUSION BRAIN TECHNIQUE: Multidetector CT imaging of the head and neck was performed using the standard protocol during  bolus administration of intravenous contrast. Multiplanar CT image reconstructions and MIPs were obtained to evaluate the vascular anatomy. Carotid stenosis measurements (when applicable) are obtained utilizing NASCET criteria, using the distal internal carotid diameter as the denominator. Multiphase CT imaging of the brain was performed following IV bolus contrast injection. Subsequent parametric perfusion maps were calculated using RAPID software. CONTRAST:  Dose is currently not known COMPARISON:  Head CT from earlier today. Brain MRI and MRA 07/19/2016 FINDINGS: CTA NECK FINDINGS Aortic arch: Mild atheromatous plaque with 3 vessel branching. Right carotid system: Atheromatous plaque which is primarily calcified and at the proximal ICA. No flow limiting stenosis or ulceration. Mildly medial and retropharyngeal course the ICA. Left carotid system: Atheromatous plaque which is greater than the right, primarily seen at the distal common carotid to proximal ICA with mixed density irregular plaque at the bifurcation. Proximal left ICA stenosis measures 60%. No dissection or generalized beading Vertebral arteries: Subclavian plaque bilaterally, greater on the right. Atheromatous plaque at both vertebral ostia. The vertebral arteries are mildly narrowed at the origins but are otherwise smooth and widely patent to the dura. Skeleton: No acute finding Other neck: Negative Upper chest: No acute finding Review of the  MIP images confirms the above findings CTA HEAD FINDINGS Anterior circulation: Atheromatous plaque along both carotid siphons. Near trifurcation of the left MCA. The middle branch is rapidly attenuated, a new finding from 2018. Negative for aneurysm or generalized beading. Posterior circulation: Small appearing vessels symmetric to the anterior circulation. The vertebral and basilar arteries are smooth and widely patent. No cerebellar or PCA branch occlusion is seen. Negative for aneurysm or beading Venous  sinuses: Negative in the arterial phase. Anatomic variants: None significant Review of the MIP images confirms the above findings CT Brain Perfusion Findings: ASPECTS: 10 CBF (<30%) Volume: 21mL Perfusion (Tmax>6.0s) volume: 37mL Mismatch Volume: 16mL Infarction Location:Left parietal Findings discussed with Dr. Amada Jupiter, who is already aware. IMPRESSION: 1. Left MCA branch occlusion when compared with 2018. Although at the M2 level, the vessel is smaller than the other M2 vessels. CT perfusion shows an ischemic area of 37 cc with core infarct of 21 cc. 2. Premature atherosclerosis for age. Most notably is a 60% proximal left ICA stenosis due to irregular plaque. Electronically Signed   By: Marnee Spring M.D.   On: 2020-05-15 04:45   CT Code Stroke CTA Neck W/WO contrast  Result Date: 05/15/2020 CLINICAL DATA:  Stroke suspected.  Weakness and fall EXAM: CT ANGIOGRAPHY HEAD AND NECK CT PERFUSION BRAIN TECHNIQUE: Multidetector CT imaging of the head and neck was performed using the standard protocol during bolus administration of intravenous contrast. Multiplanar CT image reconstructions and MIPs were obtained to evaluate the vascular anatomy. Carotid stenosis measurements (when applicable) are obtained utilizing NASCET criteria, using the distal internal carotid diameter as the denominator. Multiphase CT imaging of the brain was performed following IV bolus contrast injection. Subsequent parametric perfusion maps were calculated using RAPID software. CONTRAST:  Dose is currently not known COMPARISON:  Head CT from earlier today. Brain MRI and MRA 07/19/2016 FINDINGS: CTA NECK FINDINGS Aortic arch: Mild atheromatous plaque with 3 vessel branching. Right carotid system: Atheromatous plaque which is primarily calcified and at the proximal ICA. No flow limiting stenosis or ulceration. Mildly medial and retropharyngeal course the ICA. Left carotid system: Atheromatous plaque which is greater than the right,  primarily seen at the distal common carotid to proximal ICA with mixed density irregular plaque at the bifurcation. Proximal left ICA stenosis measures 60%. No dissection or generalized beading Vertebral arteries: Subclavian plaque bilaterally, greater on the right. Atheromatous plaque at both vertebral ostia. The vertebral arteries are mildly narrowed at the origins but are otherwise smooth and widely patent to the dura. Skeleton: No acute finding Other neck: Negative Upper chest: No acute finding Review of the MIP images confirms the above findings CTA HEAD FINDINGS Anterior circulation: Atheromatous plaque along both carotid siphons. Near trifurcation of the left MCA. The middle branch is rapidly attenuated, a new finding from 2018. Negative for aneurysm or generalized beading. Posterior circulation: Small appearing vessels symmetric to the anterior circulation. The vertebral and basilar arteries are smooth and widely patent. No cerebellar or PCA branch occlusion is seen. Negative for aneurysm or beading Venous sinuses: Negative in the arterial phase. Anatomic variants: None significant Review of the MIP images confirms the above findings CT Brain Perfusion Findings: ASPECTS: 10 CBF (<30%) Volume: 21mL Perfusion (Tmax>6.0s) volume: 37mL Mismatch Volume: 16mL Infarction Location:Left parietal Findings discussed with Dr. Amada Jupiter, who is already aware. IMPRESSION: 1. Left MCA branch occlusion when compared with 2018. Although at the M2 level, the vessel is smaller than the other M2 vessels. CT perfusion shows an ischemic  area of 37 cc with core infarct of 21 cc. 2. Premature atherosclerosis for age. Most notably is a 60% proximal left ICA stenosis due to irregular plaque. Electronically Signed   By: Marnee Spring M.D.   On: 04/14/2020 04:45   MR BRAIN WO CONTRAST  Result Date: 04/07/2020 CLINICAL DATA:  Neuro deficit, acute, stroke suspected. EXAM: MRI HEAD WITHOUT CONTRAST TECHNIQUE: Multiplanar,  multiecho pulse sequences of the brain and surrounding structures were obtained without intravenous contrast. COMPARISON:  Noncontrast head CT, CT angiogram head/neck and CT perfusion 04/11/2020. FINDINGS: Brain: Due to patient disorientation and significant motion degradation, the examination was prematurely terminated and axial and coronal T1 weighted sequences were not obtained. The acquired sequences are motion degraded. Most notably, there is moderate motion degradation of the diffusion-weighted sequences, moderate motion degradation of the axial T2 weighted and T2/FLAIR sequences, moderate motion degradation of the axial SWI sequence and severe motion degradation of the coronal T2 weighted sequence. Cerebral volume is normal. There is a moderate to large acute cortical/subcortical left MCA territory infarct affecting the left frontal and parietal lobes as well as left insula/subinsular region. No significant mass effect at this time. No hemorrhagic conversion. 1.8 cm acute cortical/subcortical infarct within the left occipital lobe (PCA vascular territory). A few scattered punctate acute infarcts are also questioned within the right frontoparietal white matter (versus artifact) Redemonstrated small volume acute subarachnoid hemorrhage along the anterior right frontal lobe. No significant white matter disease. No intracranial mass is identified. No midline shift Vascular: Left M2 branch occlusion was identified on the CTA head performed earlier today. Otherwise, there are expected proximal arterial flow voids. Skull and upper cervical spine: No evidence of focal marrow lesion on the acquired sequences. Sinuses/Orbits: Small left maxillary sinus mucous retention cyst. IMPRESSION: Motion degraded and prematurely terminated examination, as described. Moderate to large acute left MCA territory cortical/subcortical infarct affecting the left frontal and parietal lobes, as well as left insula and subinsular region.  1.8 cm acute cortical/subcortical left occipital lobe infarct (left PCA vascular territory). A few additional punctate acute infarcts are questioned within the right frontoparietal white matter (versus artifact). Redemonstrated small volume subarachnoid hemorrhage overlying the anterior right frontal lobe. Electronically Signed   By: Jackey Loge DO   On: 04/12/2020 09:37   CT C-SPINE NO CHARGE  Result Date: 04/19/2020 CLINICAL DATA:  Status post ground level fall. Right-sided facial droop, right arm/leg weakness, slurred. EXAM: CT CERVICAL SPINE WITHOUT CONTRAST TECHNIQUE: Multidetector CT imaging of the cervical spine was performed without intravenous contrast. Multiplanar CT image reconstructions were also generated. COMPARISON:  None. FINDINGS: Alignment: Normal. Skull base and vertebrae: Multilevel degenerative changes of the spine. No acute fracture. No aggressive appearing focal osseous lesion or focal pathologic process. Soft tissues and spinal canal: No prevertebral fluid or swelling. No visible canal hematoma. Upper chest: Unremarkable. Other: Please see separately dictated CT angio head/neck. IMPRESSION: 1. No acute displaced fracture or traumatic listhesis of the cervical spine. 2. Please see separately dictated CT angio head/neck. Electronically Signed   By: Tish Frederickson M.D.   On: 04/15/2020 05:29   CT Code Stroke Cerebral Perfusion with contrast  Result Date: 04/25/2020 CLINICAL DATA:  Stroke suspected.  Weakness and fall EXAM: CT ANGIOGRAPHY HEAD AND NECK CT PERFUSION BRAIN TECHNIQUE: Multidetector CT imaging of the head and neck was performed using the standard protocol during bolus administration of intravenous contrast. Multiplanar CT image reconstructions and MIPs were obtained to evaluate the vascular anatomy. Carotid stenosis measurements (when  applicable) are obtained utilizing NASCET criteria, using the distal internal carotid diameter as the denominator. Multiphase CT imaging of  the brain was performed following IV bolus contrast injection. Subsequent parametric perfusion maps were calculated using RAPID software. CONTRAST:  Dose is currently not known COMPARISON:  Head CT from earlier today. Brain MRI and MRA 07/19/2016 FINDINGS: CTA NECK FINDINGS Aortic arch: Mild atheromatous plaque with 3 vessel branching. Right carotid system: Atheromatous plaque which is primarily calcified and at the proximal ICA. No flow limiting stenosis or ulceration. Mildly medial and retropharyngeal course the ICA. Left carotid system: Atheromatous plaque which is greater than the right, primarily seen at the distal common carotid to proximal ICA with mixed density irregular plaque at the bifurcation. Proximal left ICA stenosis measures 60%. No dissection or generalized beading Vertebral arteries: Subclavian plaque bilaterally, greater on the right. Atheromatous plaque at both vertebral ostia. The vertebral arteries are mildly narrowed at the origins but are otherwise smooth and widely patent to the dura. Skeleton: No acute finding Other neck: Negative Upper chest: No acute finding Review of the MIP images confirms the above findings CTA HEAD FINDINGS Anterior circulation: Atheromatous plaque along both carotid siphons. Near trifurcation of the left MCA. The middle branch is rapidly attenuated, a new finding from 2018. Negative for aneurysm or generalized beading. Posterior circulation: Small appearing vessels symmetric to the anterior circulation. The vertebral and basilar arteries are smooth and widely patent. No cerebellar or PCA branch occlusion is seen. Negative for aneurysm or beading Venous sinuses: Negative in the arterial phase. Anatomic variants: None significant Review of the MIP images confirms the above findings CT Brain Perfusion Findings: ASPECTS: 10 CBF (<30%) Volume: 21mL Perfusion (Tmax>6.0s) volume: 37mL Mismatch Volume: 16mL Infarction Location:Left parietal Findings discussed with Dr.  Amada Jupiter, who is already aware. IMPRESSION: 1. Left MCA branch occlusion when compared with 2018. Although at the M2 level, the vessel is smaller than the other M2 vessels. CT perfusion shows an ischemic area of 37 cc with core infarct of 21 cc. 2. Premature atherosclerosis for age. Most notably is a 60% proximal left ICA stenosis due to irregular plaque. Electronically Signed   By: Marnee Spring M.D.   On: 04/26/2020 04:45   DG Chest Port 1 View  Result Date: 04/12/2020 CLINICAL DATA:  Fall, code stroke EXAM: PORTABLE CHEST 1 VIEW COMPARISON:  07/19/2016 FINDINGS: Low lung volumes. Heart and mediastinal contours are within normal limits. No focal opacities or effusions. No acute bony abnormality. IMPRESSION: Low volumes.  No active cardiopulmonary disease. Electronically Signed   By: Charlett Nose M.D.   On: 04/25/2020 05:17   ECHOCARDIOGRAM COMPLETE  Result Date: 04/24/2020    ECHOCARDIOGRAM REPORT   Patient Name:   QUINN QUAM Alers Date of Exam: 04/06/2020 Medical Rec #:  [id number]      Height:       67.0 in Accession #:    [id number]     Weight:       171.5 lb Date of Birth:  04/12/1965      BSA:          1.894 m Patient Age:    55 years       BP:           119/88 mmHg Patient Gender: M              HR:           119 bpm. Exam Location:  Inpatient Procedure: 2D Echo, Cardiac Doppler and Color  Doppler Indications:    Stroke 434.91 / I163.9  History:        Patient has prior history of Echocardiogram examinations, most                 recent 02/04/2017. Stroke; Risk Factors:Diabetes and                 Hypertension. Septal Repair:25MM Amplazter on 08/01/2016.  Sonographer:    Leta Jungling RDCS Referring Phys: [id number] RONDELL A SMITH  Sonographer Comments: Difficult study, patient is unable to remain still during echo. IMPRESSIONS  1. Septal Repair:25 mm Amplazter on 08/01/2016.  2. Left ventricular ejection fraction, by estimation, is 60 to 65%. The left ventricle has normal function. The left ventricle  has no regional wall motion abnormalities. There is mild left ventricular hypertrophy. Left ventricular diastolic parameters are indeterminate.  3. Right ventricular systolic function is normal. The right ventricular size is normal.  4. The mitral valve is normal in structure. No evidence of mitral valve regurgitation. No evidence of mitral stenosis.  5. The aortic valve is normal in structure. Aortic valve regurgitation is not visualized. No aortic stenosis is present.  6. The inferior vena cava is normal in size with greater than 50% respiratory variability, suggesting right atrial pressure of 3 mmHg. Conclusion(s)/Recommendation(s): No intracardiac source of embolism detected on this transthoracic study. A transesophageal echocardiogram is recommended to exclude cardiac source of embolism if clinically indicated. FINDINGS  Left Ventricle: Left ventricular ejection fraction, by estimation, is 60 to 65%. The left ventricle has normal function. The left ventricle has no regional wall motion abnormalities. The left ventricular internal cavity size was normal in size. There is  mild left ventricular hypertrophy. Left ventricular diastolic parameters are indeterminate. Right Ventricle: The right ventricular size is normal. No increase in right ventricular wall thickness. Right ventricular systolic function is normal. Left Atrium: Left atrial size was normal in size. Right Atrium: Right atrial size was normal in size. Pericardium: There is no evidence of pericardial effusion. Mitral Valve: The mitral valve is normal in structure. No evidence of mitral valve regurgitation. No evidence of mitral valve stenosis. Tricuspid Valve: The tricuspid valve is normal in structure. Tricuspid valve regurgitation is not demonstrated. No evidence of tricuspid stenosis. Aortic Valve: The aortic valve is normal in structure. Aortic valve regurgitation is not visualized. No aortic stenosis is present. Pulmonic Valve: The pulmonic valve  was normal in structure. Pulmonic valve regurgitation is not visualized. No evidence of pulmonic stenosis. Aorta: The aortic root is normal in size and structure. Venous: The inferior vena cava is normal in size with greater than 50% respiratory variability, suggesting right atrial pressure of 3 mmHg. IAS/Shunts: No atrial level shunt detected by color flow Doppler. Additional Comments: Septal Repair:25 mm Amplazter on 08/01/2016.  LEFT VENTRICLE PLAX 2D LVIDd:         4.50 cm LVIDs:         4.40 cm LV PW:         1.40 cm LV IVS:        1.30 cm LVOT diam:     2.20 cm LV SV:         43 LV SV Index:   22 LVOT Area:     3.80 cm  LEFT ATRIUM             Index LA diam:        3.20 cm 1.69 cm/m LA Vol (A2C):   21.0 ml 11.09 ml/m  LA Vol (A4C):   18.2 ml 9.61 ml/m LA Biplane Vol: 20.5 ml 10.82 ml/m  AORTIC VALVE LVOT Vmax:   97.90 cm/s LVOT Vmean:  63.800 cm/s LVOT VTI:    0.112 m  AORTA Ao Root diam: 2.90 cm  SHUNTS Systemic VTI:  0.11 m Systemic Diam: 2.20 cm Donato Schultz MD Electronically signed by Donato Schultz MD Signature Date/Time: 04/07/2020/2:25:43 PM    Final    CT HEAD CODE STROKE WO CONTRAST  Result Date: 04/27/2020 CLINICAL DATA:  Code stroke.  Left-sided weakness EXAM: CT HEAD WITHOUT CONTRAST TECHNIQUE: Contiguous axial images were obtained from the base of the skull through the vertex without intravenous contrast. COMPARISON:  07/19/2016 FINDINGS: Brain: Small volume subarachnoid hemorrhage at the right frontal pole. Reportedly there was a fall. No visible infarct, mass, or hydrocephalus. Vascular: No hyperdense vessel or unexpected calcification. Skull: Normal. Negative for fracture or focal lesion. Sinuses/Orbits: No acute finding. Other: Critical Value/emergent results were called by telephone at the time of interpretation on 04/25/2020 at 4:25 am to provider Zadie Rhine , who verbally acknowledged these results. ASPECTS [facility] Stroke Program Early CT Score) - Ganglionic level infarction  (caudate, lentiform nuclei, internal capsule, insula, M1-M3 cortex): 7 - Supraganglionic infarction (M4-M6 cortex): 3 Total score (0-10 with 10 being normal): 10 IMPRESSION: 1. No visible infarct. 2. Small volume subarachnoid hemorrhage along the right frontal convexity. Electronically Signed   By: Marnee Spring M.D.   On: 04/09/2020 04:26   VAS US CAROTID  Result Date: 04/07/2020 Carotid Arterial Duplex Study Indications:   CVA and Right side weakness, speech disturbance, and AMS. Risk Factors:  Hypertension, Diabetes, past history of smoking, prior CVA. Other Factors: S/p PFO closure - 08/2016. Performing Technologist: Ernestene Mention  Examination Guidelines: A complete evaluation includes B-mode imaging, spectral Doppler, color Doppler, and power Doppler as needed of all accessible portions of each vessel. Bilateral testing is considered an integral part of a complete examination. Limited examinations for reoccurring indications may be performed as noted.  Right Carotid Findings: +----------+--------+--------+--------+------------------+------------------+           PSV cm/sEDV cm/sStenosisPlaque DescriptionComments           +----------+--------+--------+--------+------------------+------------------+ CCA Prox  77      18              heterogenous      intimal thickening +----------+--------+--------+--------+------------------+------------------+ CCA Distal43      9               heterogenous      intimal thickening +----------+--------+--------+--------+------------------+------------------+ ICA Prox  57      21              calcific                             +----------+--------+--------+--------+------------------+------------------+ ICA Distal66      28                                                   +----------+--------+--------+--------+------------------+------------------+ ECA       77      12              calcific                              +----------+--------+--------+--------+------------------+------------------+ +----------+--------+-------+--------+-------------------+  PSV cm/sEDV cmsDescribeArm Pressure (mmHG) +----------+--------+-------+--------+-------------------+ [id number]      0                                  +----------+--------+-------+--------+-------------------+ +---------+--------+--+--------+--+---------+ VertebralPSV cm/s37EDV cm/s13Antegrade +---------+--------+--+--------+--+---------+  Left Carotid Findings: +----------+-------+--------+--------+-----------------------+-----------------+           PSV    EDV cm/sStenosisPlaque Description     Comments                    cm/s                                                            +----------+-------+--------+--------+-----------------------+-----------------+ CCA Prox  73     16              heterogenous and smoothintimal                                                                   thickening        +----------+-------+--------+--------+-----------------------+-----------------+ CCA Distal68     19              heterogenous and smoothintimal                                                                   thickening        +----------+-------+--------+--------+-----------------------+-----------------+ ICA Prox  110    37      1-39%   heterogenous and                                                          calcific                                 +----------+-------+--------+--------+-----------------------+-----------------+ ICA Distal55     16                                                       +----------+-------+--------+--------+-----------------------+-----------------+ ECA       71     11                                                       +----------+-------+--------+--------+-----------------------+-----------------+  +----------+--------+--------+--------+-------------------+  PSV cm/sEDV cm/sDescribeArm Pressure (mmHG) +----------+--------+--------+--------+-------------------+ Subclavian60      0                                   +----------+--------+--------+--------+-------------------+ +---------+--------+--+--------+--+---------+ VertebralPSV cm/s40EDV cm/s18Antegrade +---------+--------+--+--------+--+---------+   Summary: Right Carotid: The extracranial vessels were near-normal with only minimal wall                thickening or plaque. Left Carotid: Velocities in the left ICA are consistent with a 1-39% stenosis. Vertebrals: Bilateral vertebral arteries demonstrate antegrade flow. *See table(s) above for measurements and observations.     Preliminary    VAS [place] LOWER EXTREMITY VENOUS (DVT)  Result Date: 04/17/2020  Lower Venous DVT Study Indications: Stroke.  Limitations: Difficult exam due to patient's AMS and constant movement. Comparison Study: Previous exam 07/20/16 - negative Performing Technologist: Ernestene Mention  Examination Guidelines: A complete evaluation includes B-mode imaging, spectral Doppler, color Doppler, and power Doppler as needed of all accessible portions of each vessel. Bilateral testing is considered an integral part of a complete examination. Limited examinations for reoccurring indications may be performed as noted. The reflux portion of the exam is performed with the patient in reverse Trendelenburg.  +---------+---------------+---------+-----------+----------+--------------+ RIGHT    CompressibilityPhasicitySpontaneityPropertiesThrombus Aging +---------+---------------+---------+-----------+----------+--------------+ CFV      Full           Yes      Yes                                 +---------+---------------+---------+-----------+----------+--------------+ SFJ      Full                                                         +---------+---------------+---------+-----------+----------+--------------+ FV Prox  Full           Yes      Yes                                 +---------+---------------+---------+-----------+----------+--------------+ FV Mid   Full           Yes      Yes                                 +---------+---------------+---------+-----------+----------+--------------+ FV DistalFull           Yes      Yes                                 +---------+---------------+---------+-----------+----------+--------------+ PFV      Full                                                        +---------+---------------+---------+-----------+----------+--------------+ POP      Full           Yes      Yes                                 +---------+---------------+---------+-----------+----------+--------------+  PTV      Full                                                        +---------+---------------+---------+-----------+----------+--------------+ PERO     Full                                                        +---------+---------------+---------+-----------+----------+--------------+   +---------+---------------+---------+-----------+----------+--------------+ LEFT     CompressibilityPhasicitySpontaneityPropertiesThrombus Aging +---------+---------------+---------+-----------+----------+--------------+ CFV      Full           Yes      Yes                                 +---------+---------------+---------+-----------+----------+--------------+ SFJ      Full                                                        +---------+---------------+---------+-----------+----------+--------------+ FV Prox  Full           Yes      Yes                                 +---------+---------------+---------+-----------+----------+--------------+ FV Mid   Full           Yes      Yes                                  +---------+---------------+---------+-----------+----------+--------------+ FV DistalFull           Yes      Yes                                 +---------+---------------+---------+-----------+----------+--------------+ PFV      Full                                                        +---------+---------------+---------+-----------+----------+--------------+ POP      Full           Yes      Yes                                 +---------+---------------+---------+-----------+----------+--------------+ PTV      Full                                                        +---------+---------------+---------+-----------+----------+--------------+  PERO     Full                                                        +---------+---------------+---------+-----------+----------+--------------+     Summary: BILATERAL: - No evidence of deep vein thrombosis seen in the lower extremities, bilaterally. - No evidence of superficial venous thrombosis in the lower extremities, bilaterally. -No evidence of popliteal cyst, bilaterally.   *See table(s) above for measurements and observations.    Preliminary    PHYSICAL EXAM Physical Exam  Constitutional: Ill looking middle-aged Caucasian male mild distress. Psych: Affect appropriate to situation Eyes: No scleral injection HENT: No OP obstrucion Head: Normocephalic.  Cardiovascular: Normal rate and regular rhythm.  Respiratory: Effort normal and breath sounds normal to anterior ascultation GI: Soft.  No distension. There is no tenderness.  Skin: WDI  Neuro: Mental Status: Patient is awake, alert, restless and agitated. He is severely aphasic.  Does not follow commands.  Head and neck is deviated to the left. Cranial Nerves: II: Does not cooperate with visual field testing, pupils are equal, round, and reactive to light.   III,IV, VI: He is a left gaze preference and unable to look to the right V: Unable to assess  VII: Facial  movement with right facial weakness VIII: hearing is intact to voice X: Uvula elevates symmetrically XI: Shoulder shrug is symmetric. XII: tongue is midline without atrophy or fasciculations.  Motor: Tone is normal. Bulk is normal. 5/5 strength was present on the left, some withdrawal in the right leg, minimal movement right arm.  Sensory: Unable to assess d/t aphasia and altered mental status  Cerebellar: Does not follow commands for testing   ASSESSMENT/PLAN [name] is a 55 y.o. male with history of previous cryptogenic Left MCA stroke s/p PFO closure, lost to stroke follow up 2018, DM2, HTN, GERD presenting with aphasia, right facial droop and right sided weakness noted upon waking up at 0300 this morning. He slid to the ground from the bed after awakening. EMS was called and he was brought to ED in code stroke status. NIHSS of 23 was noted upon arrival.  2018 stroke work up/plan: Admitted on 07/19/16 for acute subcentimeter LEFT MCA territory infarcts most compatible with embolic etiology. Stroke work up including MRA head and neck, LE venous doppler, TTE, pan CT all negative. However, TCD bubble study showed small PFO with valsalva and TEE showed positive bubble study consistent with PFO. LDL 68 and A1C 5.6. UDS positive for THC. Hypercoagulable work up only positive for beta 2 glycoprotein IgG. He was discharged on ASA and had outpt follow up with Dr. Excell Seltzer who did PFO closure on 08/01/16. He was put on DAPT and lipitor. Post procedure TTE showed good position of closure device. Saw Dr. Roda Shutters August 2018. Neuro deficits had resolved. Plan was to repeat beta 2 glycoprotein IgG in one month and TCD bubble study 6 months post procedure but patient was lost to follow up.  Left MCA stroke d/t left MCA branch occlusion with unknown source with small right frontal SAH ?  Etiology.  Apparently no trauma as per wife ?  Related to thrombocytopenia   Code Stroke HCT: small single sulcal  SAH  CTA- occlusion of a trifurcation branch of the left MCA.  No aneurysm  CTP - Ischemic  area 37cc, ischemic core 21ml by CBF<30%, CBV - 28ml  2D Echo EF 60-65%. No shunt. Septal Repair:25 mm Amplazter on 08/01/2016 noted.    Will likely require TEE and LOOP recorder. EP contacted.   TCD bubble study is pending  LDL   HgbA1c 10.5  HOLD ANTICOAGULANT/ANTIPLATELET PENDING FOLLOW UP HEAD CT TOMORROW AM  EEG is pending  DVT PPX: SCDS  On ASA 81mg  PTA   NPO pending formal swallow eval   Therapy recommendations:  TBD  Disposition:  TBD  Dr. Katrinka BlazingSmith to bedside to discuss plan of care with Dr. Pearlean BrownieSethi   Blood pressure management  HTN meds at home: norvasc 10mg  daily, hctz 12.5mg  daily,  . Permissive hypertension (OK if < 220/120) but gradually normalize in 5-7 days . Long-term BP goal normotensive  Hyperlipidemia  Home meds:  None. Per chart review he stopped statin 2018 d/t myalgia  LDL NOT CALCULATED d/t TRIG 327 , goal < 70  High intensity statin plan pending   Continue statin at discharge  Diabetes type II UnControlled  Home meds:  Glucophage 500mg  daily   HgbA1c 10.5, goal < 7.0  CBGs Recent Labs    22-Nov-2020 0408 22-Nov-2020 1001 22-Nov-2020 1256  GLUCAP 466* 413* 375*      SSI  Hyperosmolar hyperglycemic state: Management per primary team    Other Stroke Risk Factors   Former Cigarette smoker  Hx of stroke as above   Other Active Problems  Acute SIRS Tachycardia to 130s, Tmax 100.7 with tachypnea 20-30 No leukocytosis. Lactic acid pending Management per primary team  AKI Ctn 1.55 on admission with BUN 27 IV bolus and MIVF  Management per primary team    Metabolic Acidosis  Initial anion gap 16 with CO2 19 Management per primary team    Hyponatremia 124 on admission Deyhdration suspected  Management per primary team    Acute thrombocytopenia 74,000 on admission.  Hepatitis panel  And bilirubin pending Hospital day # 0   I  have personally obtained history,examined this patient, reviewed notes, independently viewed imaging studies, participated in medical decision making and plan of care.ROS completed by me personally and pertinent positives fully documented  I have made any additions or clarifications directly to the above note. Agree with note above.  Patient presented with sudden onset of aphasia and right hemiparesis due to left MCA branch infarct likely of embolic etiology.  He also has a small right frontal subarachnoid hemorrhage etiology unclear possibly related to this thrombocytopenia.  Recommend hold antiplatelet therapy and repeat CT scan tomorrow.  Check EEG for seizures as patient neurological exam seems disproportionate to the size of his stroke.  He will need prolonged cardiac monitoring for A. fib as well as repeat TEE to look for any thrombus on the PFO device.  Check TCD bubble study for adequacy of PFO closure.  Patient also appears dehydrated with tachycardia and low-grade fever.  Recommend aggressive hydration and antibiotics if he spikes fever has remained aspirated.  Long discussion patient and his wife as well as with Dr. Egbert Garibaldiandall Smith and answered questions. Greater than 50% time during this 35-minute visit was spent on counseling and coordination of care about his embolic stroke discussion about full evaluation and treatment and answering questions. Delia HeadyPramod Sethi, MD Medical Director [facility] Pager: [phone] 2020-10-15 4:04 PM    To contact Stroke Continuity provider, please refer to [email or web address]. After hours, contact General Neurology

## 2020-04-20 NOTE — ED Notes (Addendum)
Pt getting an eeg at present

## 2020-04-21 ENCOUNTER — Inpatient Hospital Stay (HOSPITAL_COMMUNITY): Payer: 59

## 2020-04-21 ENCOUNTER — Other Ambulatory Visit (HOSPITAL_COMMUNITY): Payer: 59

## 2020-04-21 ENCOUNTER — Encounter (HOSPITAL_COMMUNITY): Payer: Self-pay | Admitting: Pulmonary Disease

## 2020-04-21 DIAGNOSIS — I639 Cerebral infarction, unspecified: Secondary | ICD-10-CM | POA: Diagnosis not present

## 2020-04-21 DIAGNOSIS — A419 Sepsis, unspecified organism: Secondary | ICD-10-CM

## 2020-04-21 DIAGNOSIS — D696 Thrombocytopenia, unspecified: Secondary | ICD-10-CM | POA: Diagnosis not present

## 2020-04-21 DIAGNOSIS — I63312 Cerebral infarction due to thrombosis of left middle cerebral artery: Secondary | ICD-10-CM | POA: Diagnosis not present

## 2020-04-21 DIAGNOSIS — B9562 Methicillin resistant Staphylococcus aureus infection as the cause of diseases classified elsewhere: Secondary | ICD-10-CM

## 2020-04-21 DIAGNOSIS — I63412 Cerebral infarction due to embolism of left middle cerebral artery: Secondary | ICD-10-CM | POA: Diagnosis not present

## 2020-04-21 DIAGNOSIS — I6340 Cerebral infarction due to embolism of unspecified cerebral artery: Secondary | ICD-10-CM

## 2020-04-21 DIAGNOSIS — R569 Unspecified convulsions: Secondary | ICD-10-CM | POA: Diagnosis not present

## 2020-04-21 DIAGNOSIS — N179 Acute kidney failure, unspecified: Secondary | ICD-10-CM | POA: Diagnosis not present

## 2020-04-21 DIAGNOSIS — E872 Acidosis: Secondary | ICD-10-CM | POA: Diagnosis not present

## 2020-04-21 LAB — BLOOD CULTURE ID PANEL (REFLEXED) - BCID2

## 2020-04-21 LAB — BILIRUBIN, DIRECT: Bilirubin, Direct: 1.9 mg/dL — ABNORMAL HIGH (ref 0.0–0.2)

## 2020-04-21 LAB — CBC
HCT: 37.2 % — ABNORMAL LOW (ref 39.0–52.0)
Hemoglobin: 13.4 g/dL (ref 13.0–17.0)
MCH: 33.9 pg (ref 26.0–34.0)
MCHC: 36 g/dL (ref 30.0–36.0)
MCV: 94.2 fL (ref 80.0–100.0)
Platelets: 45 10*3/uL — ABNORMAL LOW (ref 150–400)
RBC: 3.95 MIL/uL — ABNORMAL LOW (ref 4.22–5.81)
RDW: 11.8 % (ref 11.5–15.5)
WBC: 10.8 10*3/uL — ABNORMAL HIGH (ref 4.0–10.5)
nRBC: 0 % (ref 0.0–0.2)

## 2020-04-21 LAB — MAGNESIUM: Magnesium: 2.1 mg/dL (ref 1.7–2.4)

## 2020-04-21 LAB — BASIC METABOLIC PANEL
Anion gap: 13 (ref 5–15)
BUN: 23 mg/dL — ABNORMAL HIGH (ref 6–20)
CO2: 15 mmol/L — ABNORMAL LOW (ref 22–32)
Calcium: 7.7 mg/dL — ABNORMAL LOW (ref 8.9–10.3)
Chloride: 108 mmol/L (ref 98–111)
Creatinine, Ser: 1.24 mg/dL (ref 0.61–1.24)
GFR, Estimated: >60 mL/min (ref >60)
Glucose, Bld: 153 mg/dL — ABNORMAL HIGH (ref 70–99)
Potassium: 3.4 mmol/L — ABNORMAL LOW (ref 3.5–5.1)
Sodium: 136 mmol/L (ref 135–145)

## 2020-04-21 LAB — GLUCOSE, CAPILLARY
Glucose-Capillary: 137 mg/dL — ABNORMAL HIGH (ref 70–99)
Glucose-Capillary: 166 mg/dL — ABNORMAL HIGH (ref 70–99)
Glucose-Capillary: 207 mg/dL — ABNORMAL HIGH (ref 70–99)
Glucose-Capillary: 195 mg/dL — ABNORMAL HIGH (ref 70–99)
Glucose-Capillary: 185 mg/dL — ABNORMAL HIGH (ref 70–99)
Glucose-Capillary: 161 mg/dL — ABNORMAL HIGH (ref 70–99)

## 2020-04-21 LAB — VITAMIN B12: Vitamin B-12: 343 pg/mL (ref 180–914)

## 2020-04-21 LAB — PREALBUMIN: Prealbumin: <5 mg/dL — ABNORMAL LOW (ref 18–38)

## 2020-04-21 LAB — FOLATE: Folate: 3.1 ng/mL — ABNORMAL LOW (ref >5.9)

## 2020-04-21 LAB — CALCIUM, IONIZED: Calcium, Ionized, Serum: 4.6 mg/dL (ref 4.5–5.6)

## 2020-04-21 LAB — LACTIC ACID, PLASMA: Lactic Acid, Venous: 4.1 mmol/L (ref 0.5–1.9)

## 2020-04-21 LAB — PHOSPHORUS
Phosphorus: 1.6 mg/dL — ABNORMAL LOW (ref 2.5–4.6)
Phosphorus: 2.6 mg/dL (ref 2.5–4.6)

## 2020-04-21 MED ORDER — SODIUM CHLORIDE 0.9 % IV BOLUS
250.0000 mL | Freq: Once | INTRAVENOUS | Status: AC
  Administered 2020-04-21: 250 mL via INTRAVENOUS

## 2020-04-21 MED ORDER — ASPIRIN 300 MG RE SUPP
150.0000 mg | Freq: Every day | RECTAL | Status: DC
  Administered 2020-04-21: 150 mg via RECTAL
  Filled 2020-04-21: qty 1

## 2020-04-21 MED ORDER — HEPARIN SODIUM (PORCINE) 5000 UNIT/ML IJ SOLN
5000.0000 [IU] | Freq: Three times a day (TID) | INTRAMUSCULAR | Status: DC
  Administered 2020-04-21 – 2020-04-30 (×27): 5000 [IU] via SUBCUTANEOUS
  Filled 2020-04-21 (×27): qty 1

## 2020-04-21 MED ORDER — CEFAZOLIN SODIUM-DEXTROSE 2-4 GM/100ML-% IV SOLN
2.0000 g | Freq: Three times a day (TID) | INTRAVENOUS | Status: DC
  Administered 2020-04-21 – 2020-04-23 (×7): 2 g via INTRAVENOUS
  Filled 2020-04-21 (×9): qty 100

## 2020-04-21 MED ORDER — SODIUM CHLORIDE 0.9 % IV BOLUS: 500.0000 mL | Freq: Once | INTRAVENOUS | Status: DC

## 2020-04-21 MED ORDER — METOPROLOL TARTRATE 5 MG/5ML IV SOLN
5.0000 mg | INTRAVENOUS | Status: DC | PRN
  Administered 2020-04-21 – 2020-04-29 (×11): 5 mg via INTRAVENOUS
  Filled 2020-04-21 (×15): qty 5

## 2020-04-21 MED ORDER — POTASSIUM PHOSPHATES 15 MMOLE/5ML IV SOLN
45.0000 mmol | Freq: Once | INTRAVENOUS | Status: AC
  Administered 2020-04-21: 45 mmol via INTRAVENOUS
  Filled 2020-04-21: qty 15

## 2020-04-21 NOTE — Plan of Care (Incomplete)
  Temp:  [99.1 F (37.3 C)-101.5 F (38.6 C)] 99.1 F (37.3 C) (02/19 0800) Pulse Rate:  [118-141] 128 (02/19 1200) Resp:  [21-52] 30 (02/19 1200) BP: (89-146)/(65-101) 120/76 (02/19 1200) SpO2:  [93 %-100 %] 98 % (02/19 1200) Weight:  [82.4 kg] 82.4 kg (02/19 0342)  General - Well nourished, well developed, mildly agitated.  Ophthalmologic - fundi not visualized due to noncooperation.  Cardiovascular - Regular rhythm and tachycardia.  Neuro - awake alert, eyes open, global aphasia, non verbal, not following simple commands, pantomime with limited action. Left gaze preference, not able to cross midline. Blinking to visual threat on the left but not to the right. Right facial droop. RUE and RLE largely flaccid on exam. LUE and LLE not lift up on request, but spontaneous movement, able to against gravity. Sensation, coordination and gait not tested.

## 2020-04-21 NOTE — Progress Notes (Signed)
Inpatient Diabetes Program Recommendations  AACE/ADA: New Consensus Statement on Inpatient Glycemic Control (2015)  Target Ranges:  Prepandial:   less than 140 mg/dL      Peak postprandial:   less than 180 mg/dL (1-2 hours)      Critically ill patients:  140 - 180 mg/dL   Lab Results  Component Value Date   GLUCAP 207 (H) 04/21/2020   HGBA1C 10.5 (H) 04/24/2020    Review of Glycemic Control Results for Zachary Woods, Zachary Woods (MRN [id number]) as of 04/21/2020 12:25  Ref. Range 04/28/2020 19:57 04/18/2020 23:13 04/21/2020 03:06 04/21/2020 07:33 04/21/2020 11:38  Glucose-Capillary Latest Ref Range: 70 - 99 mg/dL 798 (H) 921 (H) 194 (H) 166 (H) 207 (H)   Diabetes history: DM2 Outpatient Diabetes medications: Metformin 500 mg QAM Current orders for Inpatient glycemic control: Novolog 0-15 units Q4H  Referral for DM management.  Agree with current insulin regimen.   Will speak with patient about A1C of 10.5% when appropriate.  Will continue to follow while inpatient.  Thank you, Dulce Sellar, RN, BSN Diabetes Coordinator Inpatient Diabetes Program [phone] (team pager from 8a-5p)

## 2020-04-21 NOTE — Progress Notes (Signed)
K+ 3.4 and Phos 1.6 with creat 1.54 and GFR > 60. ELink CCM electrolyte protocol initiated for KPhos dose.

## 2020-04-21 NOTE — Progress Notes (Signed)
Pharmacy Antibiotic Note  Zachary Woods is a 55 y.o. male admitted on 04/10/2020 with infection of unknown source found to have MSSA bacteremia.  Pharmacy has been consulted to narrow antibiotics to cefazolin.  He remains febrile with Tm 101.5, WBC down to 10.8, PCT 34.38, and SCr 1.24, est CrCl ~69 ml/min. BCx with GPC, BCID shows MSSA.   Plan: -Cefazolin 2 g IV q8h -Monitor CBC, renal fx, cultures and clinical progress  Weight: 82.4 kg (181 lb 10.5 oz)  Temp (24hrs), Avg:100.7 F (38.2 C), Min:99.5 F (37.5 C), Max:101.5 F (38.6 C)  Recent Labs  Lab 04/12/2020 0416 04/12/2020 0424 04/19/2020 1217 04/25/2020 1714 04/26/2020 2112 04/21/20 0423  WBC 6.7  --   --  12.0*  --  10.8*  CREATININE 1.55* 1.20 1.53* 1.23  --  1.24  LATICACIDVEN  --   --  7.4* 5.8* 4.5*  --     CrCl cannot be calculated (Unknown ideal weight.).    Allergies  Allergen Reactions  . Penicillins Itching    Has patient had a PCN reaction causing immediate rash, facial/tongue/throat swelling, SOB or lightheadedness with hypotension: No Has patient had a PCN reaction causing severe rash involving mucus membranes or skin necrosis: No Has patient had a PCN reaction that required hospitalization: No Has patient had a PCN reaction occurring within the last 10 years: Yes If all of the above answers are "NO", then may proceed with Cephalosporin use.    Antimicrobials this admission: Cefepime 2/18 >> 2/19 Vanc 2/18 >> 2/19 Cefazolin 2/19 >>  Dose adjustments this admission:  Microbiology results: 2/18 BCx (site not specified): 1 bottle GPC (BCID MSSA) 2/18 MRSA PCR neg 2/18 BCx (repeat): ng <12h 2/19 UCx: pending   Thank you for involving pharmacy in this patient's care.  Loura Back, PharmD, BCPS Clinical Pharmacist Clinical phone for 04/21/2020 until 3p is [phone] 04/21/2020 9:52 AM  **Pharmacist phone directory can be found on [email or web address] listed under [facility]**

## 2020-04-21 NOTE — Progress Notes (Addendum)
eLink Physician-Brief Progress Note Patient Name: KEISTON MANLEY DOB: [birth year] MRN: [id number]   Date of Service  04/21/2020  HPI/Events of Note  Patient with some improvement in sinus tachycardia, but heart rate is still 128, and BP 82/69.  eICU Interventions  NS 250 ml iv x 1 now.        Thomasene Lot Keelon Zurn 04/21/2020, 12:55 AM

## 2020-04-21 NOTE — Evaluation (Signed)
Speech Language Pathology Evaluation Patient Details Name: Zachary Woods MRN: [id number] DOB: [birth year] Today's Date: 04/21/2020 Time: 4268-3419 SLP Time Calculation (min) (ACUTE ONLY): 13 min  Problem List:  Patient Active Problem List   Diagnosis Date Noted  . Acute CVA (cerebrovascular accident) (HCC) 04/10/2020  . Subarachnoid hemorrhage (HCC) 04/27/2020  . Type 2 diabetes mellitus with hyperosmolar hyperglycemic state (HHS) (HCC) 04/25/2020  . AKI (acute kidney injury) (HCC) 04/22/2020  . SIRS (systemic inflammatory response syndrome) (HCC) 04/27/2020  . Metabolic acidosis with increased anion gap and accumulation of organic acids 04/23/2020  . Hypomagnesemia 04/25/2020  . Hypokalemia 04/29/2020  . Thrombocytopenia (HCC) 04/28/2020  . Stroke (HCC) 04/06/2020  . PFO (patent foramen ovale)   . Cerebral embolism with cerebral infarction 07/20/2016  . Stroke determined by clinical assessment (HCC) 07/20/2016  . Hypertension 07/19/2016  . GERD (gastroesophageal reflux disease) 07/19/2016  . Malignant hypertension 07/19/2016   Past Medical History:  Past Medical History:  Diagnosis Date  . Acid reflux   . CVA (cerebral vascular accident) (HCC)   . Diabetes mellitus, type 2 (HCC)   . Hypertension    Past Surgical History:  Past Surgical History:  Procedure Laterality Date  . PATENT FORAMEN OVALE(PFO) CLOSURE N/A 08/01/2016   Procedure: Patent [place](PFO) Closure;  Surgeon: Tonny Bollman, MD;  Location: [facility] INVASIVE CV LAB;  Service: Cardiovascular;  Laterality: N/A;  . TEE WITHOUT CARDIOVERSION N/A 07/21/2016   Procedure: TRANSESOPHAGEAL ECHOCARDIOGRAM (TEE);  Surgeon: Lars Masson, MD;  Location: [facility] ENDOSCOPY;  Service: Cardiovascular;  Laterality: N/A;   HPI:  The pt is a 55 yo male presenting with R-sided weakness. MRI revealed mod sized L MCA and a small volume subarachnoid hemorrhage. Required intubation for progressive AMS. Also found to be septic with  infection of unknown source. PMH including CVA, HTN, and DM type   Assessment / Plan / Recommendation Clinical Impression  During SLE pt was awake with left eye gaze preference and did not turn head to SLP on his right. Pt appeared frustrated, restless, anxious and uncomfortable. Internally distracted and could not focus attention on therapist or verbal/visual information presented on his left. He was unable to follow oral-motor commands or with use of left hand. Did not attempt to use gestures or demonstrate fucntional use of common objects. No meaningful vocalizations or ability to imitate. SLP will continue diagnostic treatment for communicative and cognitive abilities.    SLP Assessment  SLP Recommendation/Assessment: Patient needs continued Speech Lanaguage Pathology Services SLP Visit Diagnosis: Aphasia (R47.01);Cognitive communication deficit (R41.841)    Follow Up Recommendations  Inpatient Rehab    Frequency and Duration min 2x/week  2 weeks      SLP Evaluation Cognition  Overall Cognitive Status: Difficult to assess Arousal/Alertness: Lethargic (awake) Orientation Level:  (no respnse to yes/no) Attention: Focused Focused Attention: Impaired Focused Attention Impairment: Verbal basic Memory:  (TBA) Awareness: Impaired Awareness Impairment:  (suspect all aspects) Problem Solving: Impaired Problem Solving Impairment: Functional basic Behaviors: Restless;Poor frustration tolerance Safety/Judgment: Impaired       Comprehension  Auditory Comprehension Overall Auditory Comprehension: Impaired Yes/No Questions:  (no response) Commands: Impaired One Step Basic Commands:  (did not follow commands) Interfering Components: Attention Visual Recognition/Discrimination Discrimination: Not tested Reading Comprehension Reading Status: Not tested    Expression Expression Primary Mode of Expression: Other (comment) (non verbal- vowel/moan) Verbal Expression Overall Verbal  Expression: Impaired Initiation: Impaired Repetition: Impaired Level of Impairment:  (phoneme) Naming: Not tested Pragmatics: Impairment Impairments: Abnormal affect;Eye contact Interfering  Components: Attention Written Expression Written Expression: Not tested   Oral / Motor  Oral Motor/Sensory Function Overall Oral Motor/Sensory Function: Moderate impairment Facial ROM: Reduced right;Suspected CN VII (facial) dysfunction Facial Symmetry: Abnormal symmetry right;Suspected CN VII (facial) dysfunction Facial Strength: Reduced right;Suspected CN VII (facial) dysfunction Lingual ROM:  (not follow commands for lingual) Motor Speech Phonation:  (TBA) Articulation:  (TBA) Intelligibility: Unable to assess (comment) Motor Planning:  (TBA)   GO                    Royce Macadamia 04/21/2020, 5:18 PM   Breck Coons Lonell Face.Ed Nurse, children's [phone] Office [phone]

## 2020-04-21 NOTE — Progress Notes (Addendum)
HR elevated 135 to 140 and sustained, Dr. Denese Killings notified and ordered metoprolol IV 5 mg q 5 mins PRN for HR >120.

## 2020-04-21 NOTE — Evaluation (Signed)
Physical Therapy Evaluation Patient Details Name: Zachary Woods MRN: [id number] DOB: [birth year] Today's Date: 04/21/2020   History of Present Illness  The pt is a 55 yo male presenting with R-sided weakness. MRI revealed mod sized L MCA and a small volume subarachnoid hemorrhage. Required intubation for progressive AMS. Also found to be septic with infection of unknown source. PMH including CVA, HTN, and DM type 2.  Clinical Impression  Pt in bed upon arrival of PT, seen by PT/OT to maximize safety with initial mobility. Prior to admission the pt was independent with mobility and living with his wife (per chart review, pt unable to participate in history taking at this time due to aphasia). The pt now presents with limitations in functional mobility, activity tolerance, strength, and stability due to above dx, and will continue to benefit from skilled PT to address these deficits. The pt tolerated transition to sitting EOB using totalA of 2 to maintain stability, manage lines, and facilitate movement of all extremities. The pt's HR and RR increased with transition to sitting (HR to 146 bpm). Returned to supine in bed due to elevation in all vitals with continued static sitting. Pt did not follow any commands with any extremity through session. Did withdrawal to noxious stimuli  In LUE and LLE. Pt will continue to benefit from skilled PT to address deficits, and will benefit from CIR level therapies to facilitate return to prior high level of independence and function.      Follow Up Recommendations CIR    Equipment Recommendations   (defer to post acute)    Recommendations for Other Services Rehab consult     Precautions / Restrictions Precautions Precautions: Fall Precaution Comments: Watch vitals Restrictions Weight Bearing Restrictions: No      Mobility  Bed Mobility Overal bed mobility: Needs Assistance Bed Mobility: Supine to Sit;Sit to Supine     Supine to sit: Total  assist;+2 for physical assistance;HOB elevated Sit to supine: Total assist;+2 for physical assistance   General bed mobility comments: Total A +2 to bring BLEs towards EOB and then elevate trunk. Total A +2 for returning to supine.    Transfers                 General transfer comment: Defer for safety and elevate HR     Modified Rankin (Stroke Patients Only) Modified Rankin (Stroke Patients Only) Pre-Morbid Rankin Score: No symptoms Modified Rankin: Severe disability     Balance Overall balance assessment: Needs assistance Sitting-balance support: No upper extremity supported;Feet supported Sitting balance-Leahy Scale: Zero Sitting balance - Comments: Reliant on Max-Total A for sitting balance                                     Pertinent Vitals/Pain Pain Assessment: Faces Faces Pain Scale: Hurts little more Pain Location: Generlaized discomfort; movement of RLE Pain Descriptors / Indicators: Constant;Discomfort;Grimacing Pain Intervention(s): Monitored during session;Repositioned;Limited activity within patient's tolerance    Home Living Family/patient expects to be discharged to:: Private residence                 Additional Comments: Per chart review, lives with his wife. Wearing wedding band. Pt unable to provide information.    Prior Function           Comments: Per chart review, pt was independent and working     Higher education careers adviser  Extremity/Trunk Assessment   Upper Extremity Assessment Upper Extremity Assessment: RUE deficits/detail;Defer to OT evaluation;Difficult to assess due to impaired cognition RUE Deficits / Details: Withdrawl from pain. Edema. WFL for PROM. reactive movement to flex elbow with PROM of forearm. RUE Coordination: decreased fine motor;decreased gross motor    Lower Extremity Assessment Lower Extremity Assessment: RLE deficits/detail (no active command following LLE) RLE Deficits / Details:  flexor withdrawl from stimuli, full ROM passively. RLE Coordination: decreased fine motor    Cervical / Trunk Assessment Cervical / Trunk Assessment: Normal  Communication   Communication: Expressive difficulties  Cognition Arousal/Alertness: Awake/alert Behavior During Therapy: Flat affect Overall Cognitive Status: Difficult to assess                                        General Comments General comments (skin integrity, edema, etc.): Max HR 146. SpO2 90s on 4L. RR 30-40s.    Exercises     Assessment/Plan    PT Assessment Patient needs continued PT services  PT Problem List Decreased strength;Decreased activity tolerance;Decreased range of motion;Decreased balance;Decreased mobility;Decreased coordination;Decreased cognition;Decreased safety awareness;Impaired sensation;Impaired tone;Pain       PT Treatment Interventions DME instruction;Stair training;Gait training;Functional mobility training;Therapeutic activities;Therapeutic exercise;Balance training;Neuromuscular re-education;Cognitive remediation;Patient/family education    PT Goals (Current goals can be found in the Care Plan section)  Acute Rehab PT Goals Patient Stated Goal: Unstated PT Goal Formulation: Patient unable to participate in goal setting Time For Goal Achievement: 05/05/20 Potential to Achieve Goals: Fair    Frequency Min 4X/week   Barriers to discharge        Co-evaluation PT/OT/SLP Co-Evaluation/Treatment: Yes Reason for Co-Treatment: Complexity of the patient's impairments (multi-system involvement);Necessary to address cognition/behavior during functional activity;For patient/therapist safety PT goals addressed during session: Mobility/safety with mobility;Balance;Strengthening/ROM         AM-PAC PT "6 Clicks" Mobility  Outcome Measure Help needed turning from your back to your side while in a flat bed without using bedrails?: Total Help needed moving from lying on your  back to sitting on the side of a flat bed without using bedrails?: Total Help needed moving to and from a bed to a chair (including a wheelchair)?: Total Help needed standing up from a chair using your arms (e.g., wheelchair or bedside chair)?: Total Help needed to walk in hospital room?: Total Help needed climbing 3-5 steps with a railing? : Total 6 Click Score: 6    End of Session   Activity Tolerance: Patient limited by fatigue Patient left: in bed;with bed alarm set;with call bell/phone within reach Nurse Communication: Mobility status PT Visit Diagnosis: Muscle weakness (generalized) (M62.81);Hemiplegia and hemiparesis Hemiplegia - Right/Left: Right Hemiplegia - caused by: Cerebral infarction;Nontraumatic intracerebral hemorrhage    Time: 2992-4268 PT Time Calculation (min) (ACUTE ONLY): 19 min   Charges:              [name], DPT   Acute Rehabilitation Department Pager #: [phone]  [name] 04/21/2020, 4:51 PM

## 2020-04-21 NOTE — Consult Note (Signed)
[facility]                                                                                        Patient Identification: Patient Name: Zachary Woods MRN: [id number] Admit Date: 10/08/2020  4:07 AM Today's Date: 04/21/2020 Reason for consult:  Requesting provider:   Principal Problem:   Acute CVA (cerebrovascular accident) [facility](HCC) Active Problems:   Subarachnoid hemorrhage (HCC)   Type 2 diabetes mellitus with hyperosmolar hyperglycemic state (HHS) (HCC)   AKI (acute kidney injury) (HCC)   SIRS (systemic inflammatory response syndrome) (HCC)   Metabolic acidosis with increased anion gap and accumulation of organic acids   Hypomagnesemia   Hypokalemia   Thrombocytopenia (HCC)   Stroke (HCC)   Antibiotics: Vancomycin 2/18- c                    Cefepime 2/18                    Cefazolin 2/19-c  Lines/Tubes: PIV, external uretheral catheter  Assessment MSSA bacteremia Acute Left MCA Ischemic Infarct ( left frontal, parietal and occipital lobes) Rt frontal SAH ? Concerns for septic emboli and mycotic aneurysm  Thrombocytopenia - in the setting of sepsis   Recommendations  Antibiotics have been de-escalated to cefazolin and can continue based on renal function Repeat blood cx from 2/18 and 2/19 TTE with no vegetations noted, Will fu TEE results Management of CVA/SAH per Neurology and neuro sx  Following   Rest of the management as per the primary team. Please call with questions or concerns.  Thank you for the consult __________________________________________________________________________________________________________ HPI and Hospital Course: Zachary Woods is a 55 y.o. male with PMH of  hypertension, CVA without residual deficits, s/p PFO closure in 08/2016, diabetes mellitus type 2 and GERD who presented to the ED on 2/18 with acute right-sided weakness. Unable to obtain history  from the patient and hence, history is obtained from chart review and patient's wife. Per wife, patient did not got to work day before admit as he was not feeling well and noticed that around 3 am, patient was  trying to get out of bed to use restroom but was unable to get up and slid out of bed to the ground.  he also had fever for 1 day but no other symptoms. Per wife, he has smoking cigarettes years ago and only drinks 1-3 beers in weekends. Denies IV drug use   At ED , came as a code stroke.   Febrile with T max 101.5, WBC 6.7 ( left shift) Labs remarkable for Na 124, k 2.8BG 512, Cr 1.55, ALP 127, LA 7.4, Platelets 74  CT scan of the head without contrast showed a small volume subarachnoid hemorrhage without any acute signs of stroke. Not a TPA candidate given that he was out of the window and had acute bleed.  CTA/CTP significant for moderate sized MCA territory ischemic area.    ROS: unobtainable given patient's current clinical condition   Past Medical History:  Diagnosis Date  . Acid reflux   . CVA (cerebral vascular  accident) (HCC)   . Diabetes mellitus, type 2 (HCC)   . Hypertension     Past Surgical History:  Procedure Laterality Date  . PATENT FORAMEN OVALE(PFO) CLOSURE N/A 08/01/2016   Procedure: Patent [place](PFO) Closure;  Surgeon: Tonny Bollman, MD;  Location: [facility] INVASIVE CV LAB;  Service: Cardiovascular;  Laterality: N/A;  . TEE WITHOUT CARDIOVERSION N/A 07/21/2016   Procedure: TRANSESOPHAGEAL ECHOCARDIOGRAM (TEE);  Surgeon: Lars Masson, MD;  Location: [facility] ENDOSCOPY;  Service: Cardiovascular;  Laterality: N/A;    Scheduled Meds: . aspirin  150 mg Rectal Daily  . chlorhexidine  15 mL Mouth Rinse BID  . Chlorhexidine Gluconate Cloth  6 each Topical Daily  . folic acid  1 mg Oral Daily  . heparin  5,000 Units Subcutaneous Q8H  . insulin aspart  0-15 Units Subcutaneous Q4H  . multivitamin with minerals  1 tablet Oral Daily  . sodium chloride flush  3 mL  Intravenous Once  . thiamine  100 mg Oral Daily   Continuous Infusions: . sodium chloride 10 mL/hr at 04/15/2020 2300  . 0.9 % NaCl with KCl 20 mEq / L 100 mL/hr at 04/21/20 0952  .  ceFAZolin (ANCEF) IV    . potassium PHOSPHATE IVPB (in mmol) 45 mmol (04/21/20 0646)   PRN Meds:.acetaminophen **OR** acetaminophen (TYLENOL) oral liquid 160 mg/5 mL **OR** acetaminophen, docusate sodium, ondansetron (ZOFRAN) IV, polyethylene glycol  Allergies  Allergen Reactions  . Penicillins Itching    Has patient had a PCN reaction causing immediate rash, facial/tongue/throat swelling, SOB or lightheadedness with hypotension: No Has patient had a PCN reaction causing severe rash involving mucus membranes or skin necrosis: No Has patient had a PCN reaction that required hospitalization: No Has patient had a PCN reaction occurring within the last 10 years: Yes If all of the above answers are "NO", then may proceed with Cephalosporin use.    Social History   Socioeconomic History  . Marital status: Single    Spouse name: Not on file  . Number of children: Not on file  . Years of education: Not on file  . Highest education level: Not on file  Occupational History  . Occupation: Curator  Tobacco Use  . Smoking status: Former Games developer  . Smokeless tobacco: Never Used  Vaping Use  . Vaping Use: Never used  Substance and Sexual Activity  . Alcohol use: Yes    Alcohol/week: 1.0 standard drink    Types: 1 Shots of liquor per week    Comment: 10-12/ week-- (2-4 drinks/ day)  . Drug use: No  . Sexual activity: Yes  Other Topics Concern  . Not on file  Social History Narrative  . Not on file   Social Determinants of Health   Financial Resource Strain: Not on file  Food Insecurity: Not on file  Transportation Needs: Not on file  Physical Activity: Not on file  Stress: Not on file  Social Connections: Not on file  Intimate Partner Violence: Not on file    Vitals BP 114/88   Pulse (!) 128    Temp 99.1 F (37.3 C) (Axillary)   Resp (!) 30   Wt 82.4 kg   SpO2 98%   BMI 28.45 kg/m   Physical Exam Patient is leaned to the Rt side, Left sioded facial droop, no icterus/pale conjunctiva Chets - clear anteriorly CVS- Normal s1s2, RRR Abdomen - soft , NT Extremities - no gross erythema/swelling and tenderness Skin - no obvious rashes Back - could not examine  Pertinent Microbiology Results for orders placed or performed during the hospital encounter of 04/19/2020  Resp Panel by RT-PCR (Flu A&B, Covid) Nasopharyngeal Swab     Status: None   Collection Time: 04/29/2020  4:20 AM   Specimen: Nasopharyngeal Swab; Nasopharyngeal(NP) swabs in vial transport medium  Result Value Ref Range Status   SARS Coronavirus 2 by RT PCR NEGATIVE NEGATIVE Final    Comment: (NOTE) SARS-CoV-2 target nucleic acids are NOT DETECTED.  The SARS-CoV-2 RNA is generally detectable in upper respiratory specimens during the acute phase of infection. The lowest concentration of SARS-CoV-2 viral copies this assay can detect is 138 copies/mL. A negative result does not preclude SARS-Cov-2 infection and should not be used as the sole basis for treatment or other patient management decisions. A negative result may occur with  improper specimen collection/handling, submission of specimen other than nasopharyngeal swab, presence of viral mutation(s) within the areas targeted by this assay, and inadequate number of viral copies(<138 copies/mL). A negative result must be combined with clinical observations, patient history, and epidemiological information. The expected result is Negative.  Fact Sheet for Patients:  BloggerCourse.com  Fact Sheet for Healthcare Providers:  SeriousBroker.it  This test is no t yet approved or cleared by the [place] FDA and  has been authorized for detection and/or diagnosis of SARS-CoV-2 by FDA under an Emergency Use  Authorization (EUA). This EUA will remain  in effect (meaning this test can be used) for the duration of the COVID-19 declaration under Section 564(b)(1) of the Act, 21 U.S.C.section 360bbb-3(b)(1), unless the authorization is terminated  or revoked sooner.       Influenza A by PCR NEGATIVE NEGATIVE Final   Influenza B by PCR NEGATIVE NEGATIVE Final    Comment: (NOTE) The Xpert Xpress SARS-CoV-2/FLU/RSV plus assay is intended as an aid in the diagnosis of influenza from Nasopharyngeal swab specimens and should not be used as a sole basis for treatment. Nasal washings and aspirates are unacceptable for Xpert Xpress SARS-CoV-2/FLU/RSV testing.  Fact Sheet for Patients: BloggerCourse.com  Fact Sheet for Healthcare Providers: SeriousBroker.it  This test is not yet approved or cleared by the [place] FDA and has been authorized for detection and/or diagnosis of SARS-CoV-2 by FDA under an Emergency Use Authorization (EUA). This EUA will remain in effect (meaning this test can be used) for the duration of the COVID-19 declaration under Section 564(b)(1) of the Act, 21 U.S.C. section 360bbb-3(b)(1), unless the authorization is terminated or revoked.  Performed at [facility], [address], [place], [place] [address]   Culture, blood (routine x 2)     Status: None (Preliminary result)   Collection Time: 04/13/2020  5:15 PM   Specimen: Site Not Specified; Blood  Result Value Ref Range Status   Specimen Description SITE NOT SPECIFIED  Final   Special Requests   Final    BOTTLES DRAWN AEROBIC AND ANAEROBIC Blood Culture adequate volume   Culture  Setup Time   Final    IN BOTH AEROBIC AND ANAEROBIC BOTTLES GRAM POSITIVE COCCI CRITICAL RESULT CALLED TO, READ BACK BY AND VERIFIED WITH: J MILLEN PHARMD  04/21/20 EB Performed at [facility], [address], [place], [place] [address]    Culture [facility] POSITIVE COCCI   Final   Report Status PENDING  Incomplete  Blood Culture ID Panel (Reflexed)     Status: Abnormal   Collection Time: 04/12/2020  5:15 PM  Result Value Ref Range Status   Enterococcus faecalis  NOT DETECTED NOT DETECTED Final   Enterococcus Faecium NOT DETECTED NOT DETECTED Final   Listeria monocytogenes NOT DETECTED NOT DETECTED Final   Staphylococcus species DETECTED (A) NOT DETECTED Final    Comment: CRITICAL RESULT CALLED TO, READ BACK BY AND VERIFIED WITH: J MILLEN PHARMD  04/21/20 EB    Staphylococcus aureus (BCID) DETECTED (A) NOT DETECTED Final    Comment: CRITICAL RESULT CALLED TO, READ BACK BY AND VERIFIED WITH: J MILLEN PHARMD  04/21/20 EB    Staphylococcus epidermidis NOT DETECTED NOT DETECTED Final   Staphylococcus lugdunensis NOT DETECTED NOT DETECTED Final   Streptococcus species NOT DETECTED NOT DETECTED Final   Streptococcus agalactiae NOT DETECTED NOT DETECTED Final   Streptococcus pneumoniae NOT DETECTED NOT DETECTED Final   Streptococcus pyogenes NOT DETECTED NOT DETECTED Final   A.calcoaceticus-baumannii NOT DETECTED NOT DETECTED Final   Bacteroides fragilis NOT DETECTED NOT DETECTED Final   Enterobacterales NOT DETECTED NOT DETECTED Final   Enterobacter cloacae complex NOT DETECTED NOT DETECTED Final   Escherichia coli NOT DETECTED NOT DETECTED Final   Klebsiella aerogenes NOT DETECTED NOT DETECTED Final   Klebsiella oxytoca NOT DETECTED NOT DETECTED Final   Klebsiella pneumoniae NOT DETECTED NOT DETECTED Final   Proteus species NOT DETECTED NOT DETECTED Final   Salmonella species NOT DETECTED NOT DETECTED Final   Serratia marcescens NOT DETECTED NOT DETECTED Final   Haemophilus influenzae NOT DETECTED NOT DETECTED Final   Neisseria meningitidis NOT DETECTED NOT DETECTED Final   Pseudomonas aeruginosa NOT DETECTED NOT DETECTED Final   Stenotrophomonas maltophilia NOT DETECTED NOT DETECTED Final   Candida albicans NOT DETECTED NOT DETECTED Final    Candida auris NOT DETECTED NOT DETECTED Final   Candida glabrata NOT DETECTED NOT DETECTED Final   Candida krusei NOT DETECTED NOT DETECTED Final   Candida parapsilosis NOT DETECTED NOT DETECTED Final   Candida tropicalis NOT DETECTED NOT DETECTED Final   Cryptococcus neoformans/gattii NOT DETECTED NOT DETECTED Final   Meth resistant mecA/C and MREJ NOT DETECTED NOT DETECTED Final    Comment: Performed at [facility], [address], [place], [place] [address]  MRSA PCR Screening     Status: None   Collection Time: 04/05/2020  7:36 PM   Specimen: Nasal Mucosa; Nasopharyngeal  Result Value Ref Range Status   MRSA by PCR NEGATIVE NEGATIVE Final    Comment:        The GeneXpert MRSA Assay (FDA approved for NASAL specimens only), is one component of a comprehensive MRSA colonization surveillance program. It is not intended to diagnose MRSA infection nor to guide or monitor treatment for MRSA infections. Performed at [facility], [address], [place], [place] [address]   Culture, blood (Routine X 2) w Reflex to ID Panel     Status: None (Preliminary result)   Collection Time: 04/18/2020  9:20 PM   Specimen: BLOOD LEFT HAND  Result Value Ref Range Status   Specimen Description BLOOD LEFT HAND  Final   Special Requests AEROBIC BOTTLE ONLY Blood Culture adequate volume  Final   Culture   Final    NO GROWTH < 12 HOURS Performed at [facility], [address] [place], [place] [address]    Report Status PENDING  Incomplete  Culture, blood (routine x 2)     Status: None (Preliminary result)   Collection Time: 04/11/2020  9:27 PM   Specimen: BLOOD RIGHT HAND  Result Value Ref Range Status   Specimen Description BLOOD  RIGHT HAND  Final   Special Requests AEROBIC BOTTLE ONLY Blood Culture adequate volume  Final   Culture   Final    NO GROWTH < 12 HOURS Performed at [facility], [address] [place], [place] [address]    Report Status PENDING  Incomplete     Pertinent Lab seen by me: CBC Latest Ref Rng & Units 04/21/2020 04/09/2020 04/03/2020  WBC 4.0 - 10.5 K/uL 10.8(H) 12.0(H) -  Hemoglobin 13.0 - 17.0 g/dL 79.0 24.0 12.2(L)  Hematocrit 39.0 - 52.0 % 37.2(L) 37.9(L) 36.0(L)  Platelets 150 - 400 K/uL 45(L) 62(L) -   CMP Latest Ref Rng & Units 04/21/2020 04/26/2020 04/26/2020  Glucose 70 - 99 mg/dL 973(Z) 329(J) -  BUN 6 - 20 mg/dL 24(Q) 20 -  Creatinine 0.61 - 1.24 mg/dL 6.83 4.19 -  Sodium 622 - 145 mmol/L 136 133(L) 135  Potassium 3.5 - 5.1 mmol/L 3.4(L) 2.8(L) 2.7(LL)  Chloride 98 - 111 mmol/L 108 103 -  CO2 22 - 32 mmol/L 15(L) 18(L) -  Calcium 8.9 - 10.3 mg/dL 7.7(L) 7.6(L) -  Total Protein 6.5 - 8.1 g/dL - - -  Total Bilirubin 0.3 - 1.2 mg/dL - - -  Alkaline Phos 38 - 126 U/L - - -  AST 15 - 41 U/L - - -  ALT 0 - 44 U/L - - -     Pertinent Imagings/Other Imagings Plain films and CT images have been personally visualized and interpreted; radiology reports have been reviewed. Decision making incorporated into the Impression / Recommendations.  04/09/2020 CT head Code stroke IMPRESSION: 1. No visible infarct. 2. Small volume subarachnoid hemorrhage along the right frontal Convexity  CT head 04/21/20 IMPRESSION: Non progressed left cerebral infarcts and right frontal subarachnoid hemorrhage.  CT head WO contrast 04/07/2020 IMPRESSION: 1. Slightly limited evaluation due to patient motion. 2. Minimal increase in the amount of right frontal subarachnoid hemorrhage, which may be overestimated due to motion in this region during the exam. No surrounding mass effect. 3. Evolution of the left MCA and PCA territory infarcts, with loss of gray-white differentiation and sulcal effacement. No midline shift. No hemorrhagic transformation.  MR brain 04/19/2020 IMPRESSION: Motion degraded and prematurely terminated examination, as described.  Moderate to large acute left MCA territory cortical/subcortical infarct affecting the left  frontal and parietal lobes, as well as left insula and subinsular region.  1.8 cm acute cortical/subcortical left occipital lobe infarct (left PCA vascular territory).  A few additional punctate acute infarcts are questioned within the right frontoparietal white matter (versus artifact).  Redemonstrated small volume subarachnoid hemorrhage overlying the anterior right frontal lobe.   CT  C spine 04/26/2020 IMPRESSION: 1. No acute displaced fracture or traumatic listhesis of the cervical spine. 2. Please see separately dictated CT angio head/neck.   CTA head and neck  And CT cerebral perfusion 04/24/2020 IMPRESSION: 1. Left MCA branch occlusion when compared with 2018. Although at the M2 level, the vessel is smaller than the other M2 vessels. CT perfusion shows an ischemic area of 37 cc with core infarct of 21 cc. 2. Premature atherosclerosis for age. Most notably is a 60% proximal left ICA stenosis due to irregular plaque.    I have spent 60 minutes for this patient encounter including review of prior medical records with greater than 50% of time being face to face and coordination of their care.  Electronically signed by:   Odette Fraction, MD Infectious Disease Physician [facility] Pager: [phone]

## 2020-04-21 NOTE — Progress Notes (Addendum)
STROKE TEAM PROGRESS NOTE   INTERVAL HISTORY Admitted to ICU overnight due to concerns that he may require intubation. Able to maintain airway. Placed on insulin drip for HHS, poorly controlled DM2. Placed on Vancomycin and Cefepime for possible Sepsis. Tachycardia persists.   Patient remains with global aphasia. Does not attend to examiner or wife. Does not follow commands.  Wife at bedside. Discussed stroke diagnosis, plan of care, pending work up. She states understanding he will likely need a feeding tube placed today and agrees.  Questions answered.   Vitals:   04/21/20 1000 04/21/20 1030 04/21/20 1100 04/21/20 1200  BP: 93/80 (!) 122/97 114/88 120/76  Pulse: (!) 122 (!) 129 (!) 128 (!) 128  Resp: (!) 24 (!) 25 (!) 30 (!) 30  Temp:      TempSrc:      SpO2: 98% 98% 98% 98%  Weight:       CBC:  Recent Labs  Lab 04/13/2020 0416 04/11/2020 0424 04/29/2020 1714 04/21/20 0423  WBC 6.7  --  12.0* 10.8*  NEUTROABS 6.1  --  11.0*  --   HGB 15.4   < > 13.5 13.4  HCT 42.0   < > 37.9* 37.2*  MCV 93.5  --  94.0 94.2  PLT 74*  --  62* 45*   < > = values in this interval not displayed.   Basic Metabolic Panel:  Recent Labs  Lab 04/28/2020 0416 04/06/2020 0424 04/25/2020 1714 04/21/20 0423  NA 124*   < > 133* 136  K 2.8*   < > 2.8* 3.4*  CL 89*   < > 103 108  CO2 19*   < > 18* 15*  GLUCOSE 512*   < > 253* 153*  BUN 27*   < > 20 23*  CREATININE 1.55*   < > 1.23 1.24  CALCIUM 8.3*   < > 7.6* 7.7*  MG 1.6*  --   --  2.1  PHOS  --   --   --  1.6*   < > = values in this interval not displayed.   Lipid Panel:  Recent Labs  Lab 04/12/2020 1217  CHOL 98  TRIG 327*  HDL <10*  CHOLHDL NOT CALCULATED  VLDL 65*  LDLCALC NOT CALCULATED   HgbA1c:  Recent Labs  Lab 04/28/2020 1217  HGBA1C 10.5*   Urine Drug Screen:  Recent Labs  Lab 04/12/2020 1955  LABOPIA POSITIVE*  COCAINSCRNUR NONE DETECTED  LABBENZ NONE DETECTED  AMPHETMU NONE DETECTED  THCU NONE DETECTED  LABBARB NONE DETECTED     Alcohol Level No results for input(s): ETH in the last 168 hours.  IMAGING past 24 hours CT HEAD WO CONTRAST  Result Date: 04/21/2020 CLINICAL DATA:  Follow-up stroke EXAM: CT HEAD WITHOUT CONTRAST TECHNIQUE: Contiguous axial images were obtained from the base of the skull through the vertex without intravenous contrast. COMPARISON:  Yesterday at 5:30 p.m. FINDINGS: Brain: Large left MCA territory infarction primarily affecting the posterior division. Small left occipital cortex infarct. Non progressed right frontal subarachnoid hemorrhage when compared to most recent prior. No hydrocephalus or midline shift. Vascular: No hyperdense vessel. Skull: Normal. Negative for fracture or focal lesion. Sinuses/Orbits: No acute finding. IMPRESSION: Non progressed left cerebral infarcts and right frontal subarachnoid hemorrhage. Electronically Signed   By: Marnee Spring M.D.   On: 04/21/2020 05:23   CT HEAD WO CONTRAST  Result Date: 04/04/2020 CLINICAL DATA:  Left MCA and left PCA territory infarcts, subarachnoid hemorrhage EXAM: CT HEAD WITHOUT  CONTRAST TECHNIQUE: Contiguous axial images were obtained from the base of the skull through the vertex without intravenous contrast. COMPARISON:  04/21/2020 FINDINGS: Brain: Evaluation is limited by patient motion during the exam. Slight increase in the subarachnoid hemorrhage along the right frontal lobe, now measuring 17 x 5 mm where previously the hemorrhage had measured 4 x 9 mm. No mass effect. Expected evolution of the large left MCA territory infarct, with loss of gray-white differentiation and overlying sulcal effacement. Progression of the left occipital lobe infarct seen on prior MRI as well, with loss of gray-white differentiation. There is effacement along the sylvian fissure without midline shift or significant mass effect. No evidence of hemorrhage within the infarct beds. The lateral ventricles and midline structures are unremarkable. Vascular: No  hyperdense vessel or unexpected calcification. Skull: Normal. Negative for fracture or focal lesion. Sinuses/Orbits: No acute finding. Other: None. IMPRESSION: 1. Slightly limited evaluation due to patient motion. 2. Minimal increase in the amount of right frontal subarachnoid hemorrhage, which may be overestimated due to motion in this region during the exam. No surrounding mass effect. 3. Evolution of the left MCA and PCA territory infarcts, with loss of gray-white differentiation and sulcal effacement. No midline shift. No hemorrhagic transformation. Electronically Signed   By: Sharlet Salina M.D.   On: 04/22/2020 17:36   DG CHEST PORT 1 VIEW  Result Date: 04/21/2020 CLINICAL DATA:  Aspiration pneumonia. EXAM: PORTABLE CHEST 1 VIEW COMPARISON:  April 20, 2020 FINDINGS: The heart size and mediastinal contours are within normal limits. Both lungs are clear. The visualized skeletal structures are unremarkable. IMPRESSION: No active disease. Electronically Signed   By: Gerome Sam III M.D   On: 04/21/2020 10:03   EEG adult  Result Date: 04/28/2020 Charlsie Quest, MD     04/25/2020  4:45 PM Patient Name: Zachary Woods MRN: [id number] Epilepsy Attending: Charlsie Quest Referring Physician/Provider: Shon Hale, NP Date: 04/09/2020 Duration: 29.18 mins Patient history: 55 y.o. male with history of previous cryptogenic Left MCA stroke s/p PFO closure, lost to stroke follow up 2018, DM2, HTN, GERD presenting with aphasia, right facial droop and right sided weakness noted upon waking up at 0300 this morning. EEG to evaluate for seizure Level of alertness: lethargic AEDs during EEG study: Technical aspects: This EEG study was done with scalp electrodes positioned according to the 10-20 International system of electrode placement. Electrical activity was acquired at a sampling rate of 500Hz  and reviewed with a high frequency filter of 70Hz  and a low frequency filter of 1Hz . EEG data were recorded  continuously and digitally stored. Description: EEG showed continuous generalized 3 to 6 Hz theta-delta slowing. Hyperventilation and photic stimulation were not performed.   ABNORMALITY -Continuous slow, generalized IMPRESSION: This study is suggestive of moderate diffuse encephalopathy, nonspecific etiology. No seizures or epileptiform discharges were seen throughout the recording. Priyanka O Yadav   VAS CAROTID  Result Date: 04/21/2020 Carotid Arterial Duplex Study Indications:   CVA and Right side weakness, speech disturbance, and AMS. Risk Factors:  Hypertension, Diabetes, past history of smoking, prior CVA. Other Factors: S/p PFO closure - 08/2016. Performing Technologist: US  Examination Guidelines: A complete evaluation includes B-mode imaging, spectral Doppler, color Doppler, and power Doppler as needed of all accessible portions of each vessel. Bilateral testing is considered an integral part of a complete examination. Limited examinations for reoccurring indications may be performed as noted.  Right Carotid Findings: +----------+--------+--------+--------+------------------+------------------+  PSV cm/sEDV cm/sStenosisPlaque DescriptionComments           +----------+--------+--------+--------+------------------+------------------+ CCA Prox  77      18              heterogenous      intimal thickening +----------+--------+--------+--------+------------------+------------------+ CCA Distal43      9               heterogenous      intimal thickening +----------+--------+--------+--------+------------------+------------------+ ICA Prox  57      21              calcific                             +----------+--------+--------+--------+------------------+------------------+ ICA Distal66      28                                                   +----------+--------+--------+--------+------------------+------------------+ ECA       77      12               calcific                             +----------+--------+--------+--------+------------------+------------------+ +----------+--------+-------+--------+-------------------+           PSV cm/sEDV cmsDescribeArm Pressure (mmHG) +----------+--------+-------+--------+-------------------+ [id number]      0                                  +----------+--------+-------+--------+-------------------+ +---------+--------+--+--------+--+---------+ VertebralPSV cm/s37EDV cm/s13Antegrade +---------+--------+--+--------+--+---------+  Left Carotid Findings: +----------+-------+--------+--------+-----------------------+-----------------+           PSV    EDV cm/sStenosisPlaque Description     Comments                    cm/s                                                            +----------+-------+--------+--------+-----------------------+-----------------+ CCA Prox  73     16              heterogenous and smoothintimal                                                                   thickening        +----------+-------+--------+--------+-----------------------+-----------------+ CCA Distal68     19              heterogenous and smoothintimal  thickening        +----------+-------+--------+--------+-----------------------+-----------------+ ICA Prox  110    37      1-39%   heterogenous and                                                          calcific                                 +----------+-------+--------+--------+-----------------------+-----------------+ ICA Distal55     16                                                       +----------+-------+--------+--------+-----------------------+-----------------+ ECA       71     11                                                        +----------+-------+--------+--------+-----------------------+-----------------+ +----------+--------+--------+--------+-------------------+           PSV cm/sEDV cm/sDescribeArm Pressure (mmHG) +----------+--------+--------+--------+-------------------+ Subclavian60      0                                   +----------+--------+--------+--------+-------------------+ +---------+--------+--+--------+--+---------+ VertebralPSV cm/s40EDV cm/s18Antegrade +---------+--------+--+--------+--+---------+   Summary: Right Carotid: The extracranial vessels were near-normal with only minimal wall                thickening or plaque. Left Carotid: Velocities in the left ICA are consistent with a 1-39% stenosis. Vertebrals: Bilateral vertebral arteries demonstrate antegrade flow. *See table(s) above for measurements and observations.  Electronically signed by Heath Lark on 04/21/2020 at 10:58:38 AM.    Final    VAS [place] LOWER EXTREMITY VENOUS (DVT)  Result Date: 04/21/2020  Lower Venous DVT Study Indications: Stroke.  Limitations: Difficult exam due to patient's AMS and constant movement. Comparison Study: Previous exam 07/20/16 - negative Performing Technologist: Ernestene Mention  Examination Guidelines: A complete evaluation includes B-mode imaging, spectral Doppler, color Doppler, and power Doppler as needed of all accessible portions of each vessel. Bilateral testing is considered an integral part of a complete examination. Limited examinations for reoccurring indications may be performed as noted. The reflux portion of the exam is performed with the patient in reverse Trendelenburg.  +---------+---------------+---------+-----------+----------+--------------+ RIGHT    CompressibilityPhasicitySpontaneityPropertiesThrombus Aging +---------+---------------+---------+-----------+----------+--------------+ CFV      Full           Yes      Yes                                  +---------+---------------+---------+-----------+----------+--------------+ SFJ      Full                                                        +---------+---------------+---------+-----------+----------+--------------+  FV Prox  Full           Yes      Yes                                 +---------+---------------+---------+-----------+----------+--------------+ FV Mid   Full           Yes      Yes                                 +---------+---------------+---------+-----------+----------+--------------+ FV DistalFull           Yes      Yes                                 +---------+---------------+---------+-----------+----------+--------------+ PFV      Full                                                        +---------+---------------+---------+-----------+----------+--------------+ POP      Full           Yes      Yes                                 +---------+---------------+---------+-----------+----------+--------------+ PTV      Full                                                        +---------+---------------+---------+-----------+----------+--------------+ PERO     Full                                                        +---------+---------------+---------+-----------+----------+--------------+   +---------+---------------+---------+-----------+----------+--------------+ LEFT     CompressibilityPhasicitySpontaneityPropertiesThrombus Aging +---------+---------------+---------+-----------+----------+--------------+ CFV      Full           Yes      Yes                                 +---------+---------------+---------+-----------+----------+--------------+ SFJ      Full                                                        +---------+---------------+---------+-----------+----------+--------------+ FV Prox  Full           Yes      Yes                                  +---------+---------------+---------+-----------+----------+--------------+ FV Mid   Full  Yes      Yes                                 +---------+---------------+---------+-----------+----------+--------------+ FV DistalFull           Yes      Yes                                 +---------+---------------+---------+-----------+----------+--------------+ PFV      Full                                                        +---------+---------------+---------+-----------+----------+--------------+ POP      Full           Yes      Yes                                 +---------+---------------+---------+-----------+----------+--------------+ PTV      Full                                                        +---------+---------------+---------+-----------+----------+--------------+ PERO     Full                                                        +---------+---------------+---------+-----------+----------+--------------+     Summary: BILATERAL: - No evidence of deep vein thrombosis seen in the lower extremities, bilaterally. - No evidence of superficial venous thrombosis in the lower extremities, bilaterally. -No evidence of popliteal cyst, bilaterally.   *See table(s) above for measurements and observations. Electronically signed by Heath Lark on 04/21/2020 at 10:59:16 AM.    Final    US Abdomen Limited RUQ (LIVER/GB)  Result Date: 04/21/2020 CLINICAL DATA:  Elevated LFTs EXAM: ULTRASOUND ABDOMEN LIMITED RIGHT UPPER QUADRANT COMPARISON:  CT chest, abdomen and pelvis 07/20/2016 FINDINGS: Gallbladder: No gallstones or wall thickening visualized. Patient clinical status limits evaluation of a sonographic Murphy sign. Common bile duct: Diameter: 4 mm, nondilated Liver: No focal lesion identified. Previously seen focus in segment 4 on prior CT imaging is not well visualized on today's exam. Within normal limits in parenchymal echogenicity. Smooth surface  contour. No intrahepatic biliary ductal dilatation. Portal vein is patent on color Doppler imaging with normal direction of blood flow towards the liver. Other: None. IMPRESSION: Unremarkable right upper quadrant ultrasound. Electronically Signed   By: Kreg Shropshire M.D.   On: 04/21/2020 00:19   PHYSICAL EXAM Physical Exam  Constitutional: Ill looking middle-aged Caucasian male mild distress. Psych: Affect appropriate to situation Eyes: No scleral injection HENT: No OP obstrucion Head: Normocephalic.  Cardiovascular: Normal rate and regular rhythm.  Respiratory: Effort normal and breath sounds normal to anterior ascultation GI: Soft.  No distension. There is no tenderness.  Skin: WDI  Neuro: Mental Status: Alert with eyes open.  Globally  aphasic.  Does not follow commands or mimic.  Head and neck is deviated to the left. Cranial Nerves: II: Does not cooperate with visual field testing, pupils are equal, round, and reactive to light.   III,IV, VI: He has a left gaze preference and unable to look to the right. V: Unable to assess  VII: Facial movement with right facial weakness VIII: Hearing is intact to voice X:  XI: Does not follow commands.  XII: tongue is midline without atrophy or fasciculations.  Motor: Tone is normal. Bulk is normal. Antigravity  strength was present on the left with spontaneous movement, some withdrawal in the right leg, minimal movement right arm.  Sensory: Unable to fully assess d/t aphasia and altered mental status. Except with withdrawal as above.   Cerebellar: Does not follow commands for testing, right hemiplegia impairs exam.  ASSESSMENT/PLAN Zachary Woods is a 55 y.o. male with history of previous cryptogenic Left MCA stroke s/p PFO closure, lost to stroke follow up 2018, DM2, HTN, GERD presenting with aphasia, right facial droop and right sided weakness noted upon waking up at 0300 this morning. He slid to the ground from the bed after awakening.  EMS was called and he was brought to ED in code stroke status. NIHSS of 23 was noted upon arrival.  2018 stroke work up/plan: Admitted on 07/19/16 for acute subcentimeter LEFT MCA territory infarcts most compatible with embolic etiology. Stroke work up including MRA head and neck, LE venous doppler, TTE, pan CT all negative. However, TCD bubble study showed small PFO with valsalva and TEE showed positive bubble study consistent with PFO. LDL 68 and A1C 5.6. UDS positive for THC. Hypercoagulable work up only positive for beta 2 glycoprotein IgG. He was discharged on ASA and had outpt follow up with Dr. Excell Seltzerooper who did PFO closure on 08/01/16. He was put on DAPT and lipitor. Post procedure TTE showed good position of closure device. Saw Dr. Roda ShuttersXu August 2018. Neuro deficits had resolved. Plan was to repeat beta 2 glycoprotein IgG in one month and TCD bubble study 6 months post procedure but patient was lost to follow up.  Left MCA stroke d/t left MCA branch occlusion with unknown source with small right frontal SAH of unknown etiology.  Apparently no trauma  per wife.   Related to thrombocytopenia?   Code Stroke HCT: small single sulcal SAH  CTA- occlusion of a trifurcation branch of the left MCA.  No aneurysm  CTP - Ischemic area 37cc, ischemic core 21ml by CBF<30%, CBV - 28ml  2D Echo EF 60-65%. No shunt. Septal Repair:25 mm Amplazter on 08/01/2016 noted.    TEE pending   TCD bubble study is pending  LDL   HgbA1c 10.5  Beta-2-glycoprotein pending  Continue ASA 150mg  suppository for the present  EEG showed moderate diffuse encephalopathy, nonspecific etiology. No seizures or epileptiform discharges were seen.   DVT PPX: SCDS  HOLD Antiplatelets due to thrombocytopenia  On ASA 81mg  PTA  NPO, Cortrak tube placement ordered per CCM  Therapy recommendations:  TBD  Disposition:  TBD  Dr. Katrinka BlazingSmith to bedside to discuss plan of care with Dr. Pearlean BrownieSethi   Blood pressure management . HTN meds at  home: norvasc 10mg  daily, hctz 12.5mg  daily, not ordered. . Hypotension/soft BP intermittently in past 24 hours in the setting of bacteremia on MIVF at 150cc/hr. Keep BP at least greater than 100 systolic.  Marland Kitchen. Long-term BP goal normotensive  Hyperlipidemia  Home meds:  None. Per chart  review he stopped statin 2018 d/t myalgia  LDL not calculated d/t TRIG 327 , goal < 70  LDL direct 15.3  High intensity statin plan pending   Continue statin at discharge  Diabetes type II UnControlled  Home meds:  Glucophage 500mg  daily   HgbA1c 10.5, goal < 7.0  CBGs Recent Labs    04/21/20 0306 04/21/20 0733 04/21/20 1138  GLUCAP 137* 166* 207*      SSI  Hyperosmolar hyperglycemic state: Management per CCM on insulin drip.    Thrombocytopenia Plt 74->62->45 ASA discontinued HOLD antiplatelet meds   Other Stroke Risk Factors   Former Cigarette smoker  Hx of stroke as above   Other Active Problems  MSSA bacteremia  Tachycardia to 130s persists, Tmax 101.5 with tachypnea 20-30s, BP soft to hypotensive  in past several hours Mild leukocytosis 10.8. Lactic acid 7.5->5.8->4.5 Antibiotics narrowed to cefepime  Serial Blood cultures pending TEE pending ID consult pending  Management per CCM   AKI Ctn 1.55->1.23, resolving Management per CCM   Metabolic Acidosis  Initial anion gap 16 with CO2 19, improving with anion gap 12  Management per CCM    Hyponatremia 124->126->135->133 Dehydration suspected, Improving Management per CCM   Acute thrombocytopenia 74,000 on admission.  Hepatitis panel  And bilirubin pending   Hospital day # 1    ATTENDING NOTE: I reviewed above note and agree with the assessment and plan. Pt was seen and examined.   55 year old male with history of hypertension, stroke admitted for aphasia and right-sided weakness.  In 07/2016 patient had left MCA small infarct.  MRA head and neck negative.  DVT, TTE, pan CT negative.  TCD bubble  study showed a small PFO.  TEE positive for PFO.  LDL 68 and A1c 5.6.  THC positive.  Hypercoagulable work-up showed beta-2 glycoprotein IgG 57.  Discharged with aspirin.  PFO closure done in 08/2016.  Repeat TTE showed good PFO closure procedure.  He was put on DAPT and Lipitor after PFO closure.  Repeat beta-2 glycoprotein IgG in 11/2016 was 90.  However, patient was lost follow-up with neurology.  Antiphospholipid syndrome was not diagnosed and was not treated.  On this admission, CT x3 showed stable moderate left MCA infarct and small right frontal SAH.  CTA head and neck left M2 occlusion, left ICA 60% stenosis..  CT perfusion showed a 21 cc core and 37 cc penumbra.  EEG negative for seizure.  EF 60 to 65%.  Carotid Doppler negative DVT negative.  A1c 10.5, LDL direct 15.3, B12 343.  Sodium 126, potassium 2.9, creatinine 1.53.  Still has hyperglycemia with lactate acid 7.4.  Still has thrombocytopenia with platelet 74-62-45.  Blood culture positive for gram-positive cocci.  Temperature 101.3.  General - Well nourished, well developed, mildly agitated.  Ophthalmologic - fundi not visualized due to noncooperation.  Cardiovascular - Regular rhythm and tachycardia.  Neuro - awake alert, eyes open, global aphasia, non verbal, not following simple commands, pantomime with limited action. Left gaze preference, not able to cross midline. Blinking to visual threat on the left but not to the right. Right facial droop. RUE and RLE largely flaccid on exam. LUE and LLE not lift up on request, but spontaneous movement, able to against gravity. Sensation, coordination and gait not tested.  Patient currently in sepsis and septic shock.  With bacteremia, need TEE to rule out endocarditis as part of cause of stroke.  However, patient does have antiphospholipid syndrome which could contribute for stroke also.  Repeat beta-2 glycoprotein pending.  Given progressive thrombocytopenia, aspirin discontinued.  CCM on board  for management of fever, sepsis, leukocytosis, hyperglycemia, hypokalemia, hyponatremia.  Currently patient on cefepime and vancomycin as well as insulin drip.  This patient is critically ill due to sepsis, left MCA stroke, hyperglycemia, fever, thrombocytopenia and at significant risk of neurological worsening, death form septic shock, stroke recurrence, hemorrhagic conversion, DKA, bleeding and seizure. This patient's care requires constant monitoring of vital signs, hemodynamics, respiratory and cardiac monitoring, review of multiple databases, neurological assessment, discussion with family, other specialists and medical decision making of high complexity. I spent 40 minutes of neurocritical care time in the care of this patient. I had long discussion with wife at bedside, updated pt current condition, treatment plan and potential prognosis, and answered all the questions.  She expressed understanding and appreciation.  I also discussed with Dr. Denese Killings.   Marvel Plan, MD PhD Stroke Neurology 04/21/2020 10:04 PM     To contact Stroke Continuity provider, please refer to [email or web address]. After hours, contact General Neurology

## 2020-04-21 NOTE — Evaluation (Signed)
Occupational Therapy Evaluation Patient Details Name: Zachary Woods MRN: [id number] DOB: [birth year] Today's Date: 04/21/2020    History of Present Illness The pt is a 55 yo male presenting with R-sided weakness. MRI revealed mod sized L MCA and a small volume subarachnoid hemorrhage. Required intubation for progressive AMS. Also found to be septic with infection of unknown source. PMH including CVA, HTN, and DM type 2.    Clinical Impression   PTA, pt was living with his wife and was independent per chart review; pt unable to provide information due to aphasia. Pt currently requiring Total A +2 for ADLs and bed mobility. Pt HR and RR elevating with sitting at EOB (Max HR 146). Returned to supine and monitored BR and HR; RN notified. Pt not following commands but did make eye contact during session. Withdrawal to pain at LUE and pulling away with possible discomfort during ROM of RLE. Pt would benefit from further acute OT to facilitate safe dc. Recommend dc to CIR for further OT to optimize safety, independence with ADLs, and return to PLOF.   BP:  Supine 107/71 (82) Supine after ROM 123/73 (89) Sitting EOB 143/89 (105) Returned to supine 124/78 (89)       Follow Up Recommendations  CIR    Equipment Recommendations  Other (comment) (Defer to next venue)    Recommendations for Other Services PT consult;Rehab consult;Speech consult     Precautions / Restrictions Precautions Precautions: Fall Precaution Comments: Watch vitals      Mobility Bed Mobility Overal bed mobility: Needs Assistance Bed Mobility: Supine to Sit;Sit to Supine     Supine to sit: Total assist;+2 for physical assistance;HOB elevated Sit to supine: Total assist;+2 for physical assistance   General bed mobility comments: Total A +2 to bring BLEs towards EOB and then elevate trunk. Total A +2 for returning to supine.    Transfers                 General transfer comment: Defer for safety and  elevate HR    Balance Overall balance assessment: Needs assistance Sitting-balance support: No upper extremity supported;Feet supported Sitting balance-Leahy Scale: Zero Sitting balance - Comments: Reliant on Max-Total A for sitting balance                                   ADL either performed or assessed with clinical judgement   ADL Overall ADL's : Needs assistance/impaired                                       General ADL Comments: Total A for ADLs with decreased cognition, balance, and strength. Not following commands. Global aphasia     Vision         Perception     Praxis      Pertinent Vitals/Pain Pain Assessment: Faces Faces Pain Scale: Hurts little more Pain Location: Generlaized discomfort; movement of RLE Pain Descriptors / Indicators: Constant;Discomfort;Grimacing Pain Intervention(s): Monitored during session;Repositioned     Hand Dominance     Extremity/Trunk Assessment Upper Extremity Assessment Upper Extremity Assessment: RUE deficits/detail RUE Deficits / Details: Withdrawl from pain. Edema. WFL for PROM. reactive movement to flex elbow with PROM of forearm. RUE Coordination: decreased fine motor;decreased gross motor   Lower Extremity Assessment Lower Extremity Assessment: Defer to PT evaluation  Communication Communication Communication: Expressive difficulties   Cognition Arousal/Alertness: Awake/alert Behavior During Therapy: Flat affect Overall Cognitive Status: Difficult to assess                                     General Comments  Max HR 146. SpO2 90s on 4L. RR 30-40s.    Exercises     Shoulder Instructions      Home Living Family/patient expects to be discharged to:: Private residence                                 Additional Comments: Per chart review, lives with his wife. Wearing wedding band. Pt unable to provide information.      Prior  Functioning/Environment          Comments: Per chart review, pt was independent and working        OT Problem List: Decreased strength;Decreased range of motion;Decreased activity tolerance;Impaired balance (sitting and/or standing);Decreased coordination;Decreased cognition;Decreased safety awareness;Decreased knowledge of use of DME or AE;Decreased knowledge of precautions;Impaired UE functional use;Pain      OT Treatment/Interventions: Self-care/ADL training;Therapeutic exercise;DME and/or AE instruction;Therapeutic activities;Energy conservation;Patient/family education    OT Goals(Current goals can be found in the care plan section) Acute Rehab OT Goals Patient Stated Goal: Unstated OT Goal Formulation: Patient unable to participate in goal setting Time For Goal Achievement: 05/05/20 Potential to Achieve Goals: Good  OT Frequency: Min 2X/week   Barriers to D/C:            Co-evaluation              AM-PAC OT "6 Clicks" Daily Activity     Outcome Measure Help from another person eating meals?: Total Help from another person taking care of personal grooming?: Total Help from another person toileting, which includes using toliet, bedpan, or urinal?: Total Help from another person bathing (including washing, rinsing, drying)?: Total Help from another person to put on and taking off regular upper body clothing?: Total Help from another person to put on and taking off regular lower body clothing?: Total 6 Click Score: 6   End of Session Equipment Utilized During Treatment: Oxygen (4L) Nurse Communication: Mobility status;Other (comment) (HR)  Activity Tolerance: Patient limited by fatigue (Elevated HR) Patient left: in bed;with call bell/phone within reach;with bed alarm set;with restraints reapplied;with SCD's reapplied  OT Visit Diagnosis: Unsteadiness on feet (R26.81);Other abnormalities of gait and mobility (R26.89);Muscle weakness (generalized)  (M62.81);Pain;Hemiplegia and hemiparesis Hemiplegia - Right/Left: Right Hemiplegia - caused by: Cerebral infarction Pain - part of body:  (Generalized)                Time: 8850-2774 OT Time Calculation (min): 19 min Charges:  OT General Charges $OT Visit: 1 Visit OT Evaluation $OT Eval Moderate Complexity: 1 Mod  Shonta Phillis MSOT, OTR/L Acute Rehab Pager: [phone] Office: [phone]  Theodoro Grist Giordan [place] 04/21/2020, 4:27 PM

## 2020-04-21 NOTE — Progress Notes (Signed)
Inpatient Rehab Admissions Coordinator Note:   Per therapy recommendations, pt was screened for CIR candidacy by Megan Salon, MS CCC-SLP. At this time, Pt. Is total A +2 for bed mobility and unsafe to attempt transfers. He is not currently a candidate for CIR, but may become a CIR candidate if he progresses with therapies.  I will not place a CIR consult at this time but will follow and monitor for progress. Please contact me with any questions.   Megan Salon, MS, CCC-SLP Rehab Admissions Coordinator  [phone] (celll) [phone] (office)

## 2020-04-21 NOTE — Consult Note (Signed)
NAME:  Zachary Woods, MRN:  [id number], DOB:  1965-12-26, LOS: 1 ADMISSION DATE:  04/09/2020, CONSULTATION DATE:  2/18 REFERRING MD:  Dr. Madelyn Flavors , CHIEF COMPLAINT:  AMS with acute stroke   Brief History:  Zachary Woods is a 55 y.o. male who was admitted 2/18 with left MCA infarct and small right frontal SAH.  Required intubation for progressive AMS.  History of Present Illness:  Pt is encephelopathic; therefore, this HPI is obtained from chart review. Zachary Woods is a 55 y.o. male who has a PMH including but not limited to HTN, CVA, PFO s/p closure 2018, DM 2, GERD (see "past medical history" for rest).  He presented to [facility] ED 2/18 with right sided weakness.  He was last normal the night prior when he went to bed around 9PM.  Wife woke up around 3AM when pt was trying to get up to use the restroom but was unable to do so and slid from bed to ground (no loss of consciousness).   In ED, CT head demonstrated small volume SAH.  CTA / CTP demonstrated mod sized left MCA territory ischemic area.  He was initially admitted by [facility]; however, later in the day had progressive AMS to the point he required intubation.  PCCM subsequently called in consultation.  Per notes, neuro had raised concern for whether pt could have underlying endocarditis.  TTE did not suggest valvular vegetations, TEE pending.  Started on vanc / cefepime empirically.  Past Medical History:  has Hypertension; GERD (gastroesophageal reflux disease); Malignant hypertension; Cerebral embolism with cerebral infarction; Stroke determined by clinical assessment (HCC); PFO (patent foramen ovale); Acute CVA (cerebrovascular accident) (HCC); Subarachnoid hemorrhage (HCC); Type 2 diabetes mellitus with hyperosmolar hyperglycemic state (HHS) (HCC); AKI (acute kidney injury) (HCC); SIRS (systemic inflammatory response syndrome) (HCC); Metabolic acidosis with increased anion gap and accumulation of organic acids; Hypomagnesemia;  Hypokalemia; Thrombocytopenia (HCC); and Stroke [facility]) on their problem list.  Significant Hospital Events:  2/18 > admit, intubated.  Consults:  Neuro, Cards.  Procedures:    Significant Diagnostic Tests:  CT / CTA / CTP head and neck 2/18 > small volume right frontal SAH, left MCA occlusion, ischemic area of 37cc with core infarct 21cc, 60% prox left ICA stenosis. MRI brain 2/18 > mod to large acute left MCA infarct, 1.8cm acute cortical / subcortical left occipital lobe infarct, small volume right frontal lobe SAH. Echo 2/18 > EF 60 - 65%, no source of embolism detected. LE duplex 2/18 > neg. Carotid US 2/18 > 1 - 39% stenosis left ICA. EEG 2/18 > mod to diffuse encephalopathy without seizures or epileptiform discharges. TEE 2/18 > Chest x-ray 2/19- poor inspiratory effort but otherwise normal  Micro Data:  COVID 2/18 > neg. Flu 2/18 > neg. Blood cultures 2/18- MSSA positive.  Antimicrobials:  Vanc 2/18 >  Cefepime 2/18 >   Interim History / Subjective:  Remains somnolent with sonorous breathing.  Increased oxygen requirements this morning but no overt coughing or signs of aspiration.  Objective   Blood pressure 114/88, pulse (!) 128, temperature 99.1 F (37.3 C), temperature source Axillary, resp. rate (!) 30, weight 82.4 kg, SpO2 98 %.        Intake/Output Summary (Last 24 hours) at 04/21/2020 1239 Last data filed at 04/21/2020 1100 Gross per 24 hour  Intake 4953.83 ml  Output 1100 ml  Net 3853.83 ml   Filed Weights   04/15/2020 0400 04/21/20 0342  Weight: 77.8 kg 82.4 kg  Examination: General: Middle aged ill appearing male lying in bed in NAD HEENT: [place]/AT, MM pink/moist, PERRL,  Neuro: Opens eyes to verbal stimuli, remains obtunded follows commands weakly on left side, not moving right side. CV: s1s2 regular rate and rhythm, no murmur, rubs, or gallops,  PULM:  Clear bilaterally, tachypnea, no acute resp distress GI: soft, bowel sounds active in all 4  quadrants, non-tender, non-distended Extremities: warm/dry, no edema  Skin: Occasional spider nevi over her upper chest.  Resolved Hospital Problem list     Assessment & Plan:  Left MCA stroke with left MCA branch occlusion Small right frontal subarachnoid MSSA bacteremia Hyperosmolar hyperglycemic state on background of poorly controlled diabetes with hemoglobin A1c 10.5% Uncontrolled type 2 diabetes  Acute Kidney Injury  History of hypertension Alcohol use Thrombocytopenia   Plan:  -Narrow antibiotic coverage to cefazolin, TEE pending -Maintain patient in upright position at high risk for aspiration and intubation -Continue current insulin regimen as glycemic control has improved   Daily Goals Checklist  Pain/Anxiety/Delirium protocol (if indicated): None Neuro vitals: every 2 hours AED's: None VAP protocol (if indicated): Not intubated Respiratory support goals: Encourage deep breathing.  Titrate oxygen to keep sats greater than 88% Blood pressure target: Keep systolic less than 160 DVT prophylaxis: Heparin 3 times daily Nutrition Status:  Place core track to high risk for swallowing failure. GI prophylaxis: Not indicated Fluid status goals: Allow autoregulation at this time Urinary catheter: Use external catheter Central lines: No central line Glucose control: Type 2 diabetes acceptable control Mobility/therapy needs: Bedrest Antibiotic de-escalation: Cefazolin Home medication reconciliation: On hold Daily labs: CBC BMP Code Status: Full code Family Communication: Wife updated at bedside Disposition: ICU  Goals of Care:  Last date of multidisciplinary goals of care discussion:Pending  Family and staff present: Pending  Summary of discussion: Pending  Follow up goals of care discussion due: Pending  Code Status: Full  Labs   CBC: Recent Labs  Lab 2020-04-28 0416 04-28-20 0424 April 28, 2020 1643 04-28-20 1714 04/21/20 0423  WBC 6.7  --   --  12.0* 10.8*   NEUTROABS 6.1  --   --  11.0*  --   HGB 15.4 15.6 12.2* 13.5 13.4  HCT 42.0 46.0 36.0* 37.9* 37.2*  MCV 93.5  --   --  94.0 94.2  PLT 74*  --   --  62* 45*    Basic Metabolic Panel: Recent Labs  Lab 04-28-20 0416 04-28-20 0424 2020-04-28 1217 2020/04/28 1643 04-28-2020 1714 04/21/20 0423  NA 124* 126* 126* 135 133* 136  K 2.8* 2.8* 2.9* 2.7* 2.8* 3.4*  CL 89* 88* 94*  --  103 108  CO2 19*  --  16*  --  18* 15*  GLUCOSE 512* 522* 416*  --  253* 153*  BUN 27* 27* 23*  --  20 23*  CREATININE 1.55* 1.20 1.53*  --  1.23 1.24  CALCIUM 8.3*  --  8.0*  --  7.6* 7.7*  MG 1.6*  --   --   --   --  2.1  PHOS  --   --   --   --   --  1.6*   GFR: CrCl cannot be calculated (Unknown ideal weight.). Recent Labs  Lab Apr 28, 2020 0416 28-Apr-2020 1217 28-Apr-2020 1714 2020-04-28 2112 04/21/20 0423  PROCALCITON  --   --  34.38  --   --   WBC 6.7  --  12.0*  --  10.8*  LATICACIDVEN  --  7.4* 5.8*  4.5*  --     Liver Function Tests: Recent Labs  Lab 04/10/2020 0416  AST 40  ALT 23  ALKPHOS 127*  BILITOT 1.7*  PROT 6.2*  ALBUMIN 2.8*   No results for input(s): LIPASE, AMYLASE in the last 168 hours. No results for input(s): AMMONIA in the last 168 hours.  ABG    Component Value Date/Time   PHART 7.411 04/05/2020 1643   PCO2ART 28.2 (L) 04/06/2020 1643   PO2ART 77 (L) 04/25/2020 1643   HCO3 17.7 (L) 04/05/2020 1643   TCO2 19 (L) 04/09/2020 1643   ACIDBASEDEF 5.0 (H) 04/21/2020 1643   O2SAT 95.0 04/15/2020 1643     Coagulation Profile: Recent Labs  Lab 04/21/2020 0416  INR 1.3*    Cardiac Enzymes: No results for input(s): CKTOTAL, CKMB, CKMBINDEX, TROPONINI in the last 168 hours.  HbA1C: Hgb A1c MFr Bld  Date/Time Value Ref Range Status  04/25/2020 12:17 PM 10.5 (H) 4.8 - 5.6 % Final    Comment:    (NOTE) Pre diabetes:          5.7%-6.4%  Diabetes:              >6.4%  Glycemic control for   <7.0% adults with diabetes   08/04/2019 05:25 PM 8.2 (H) 4.8 - 5.6 % Final     Comment:             Prediabetes: 5.7 - 6.4          Diabetes: >6.4          Glycemic control for adults with diabetes: <7.0     CBG: Recent Labs  Lab 04/09/2020 1957 04/11/2020 2313 04/21/20 0306 04/21/20 0733 04/21/20 1138  GLUCAP 151* 134* 137* 166* 207*     Critical care time:    CRITICAL CARE Performed by: Lynnell Catalan   Total critical care time: 40 minutes  Critical care time was exclusive of separately billable procedures and treating other patients.  Critical care was necessary to treat or prevent imminent or life-threatening deterioration.  Critical care was time spent personally by me on the following activities: development of treatment plan with patient and/or surrogate as well as nursing, discussions with consultants, evaluation of patient's response to treatment, examination of patient, obtaining history from patient or surrogate, ordering and performing treatments and interventions, ordering and review of laboratory studies, ordering and review of radiographic studies, pulse oximetry, re-evaluation of patient's condition and participation in multidisciplinary rounds.  Lynnell Catalan, MD [facility] ICU Physician [facility]  Pager: [phone] Mobile: [phone] After hours: [phone].   04/21/2020, 12:39 PM

## 2020-04-21 NOTE — Progress Notes (Signed)
PHARMACY - PHYSICIAN COMMUNICATION CRITICAL VALUE ALERT - BLOOD CULTURE IDENTIFICATION (BCID)  Zachary Woods is an 55 y.o. male who presented to [facility] on 04/25/2020 with a chief complaint of left MCA branch occlusion and a small volume subarachnoid hemorrhage.  Assessment:  71 yom admitted with stroke and small SAH and sepsis (unknown source) started on vancomycin and cefepime. 2/3 BCx with GPC, BCID showing MSSA.  Name of physician (or Provider) Contacted: Dr Denese Killings  Current antibiotics: cefepime   Changes to prescribed antibiotics recommended:  Response not received from provider;  current antibiotics are likely to cover the isolated organism.  Consider de-escalation soon.  Results for orders placed or performed during the hospital encounter of 04/13/2020  Blood Culture ID Panel (Reflexed) (Collected: 04/23/2020  5:15 PM)  Result Value Ref Range   Enterococcus faecalis NOT DETECTED NOT DETECTED   Enterococcus Faecium NOT DETECTED NOT DETECTED   Listeria monocytogenes NOT DETECTED NOT DETECTED   Staphylococcus species DETECTED (A) NOT DETECTED   Staphylococcus aureus (BCID) DETECTED (A) NOT DETECTED   Staphylococcus epidermidis NOT DETECTED NOT DETECTED   Staphylococcus lugdunensis NOT DETECTED NOT DETECTED   Streptococcus species NOT DETECTED NOT DETECTED   Streptococcus agalactiae NOT DETECTED NOT DETECTED   Streptococcus pneumoniae NOT DETECTED NOT DETECTED   Streptococcus pyogenes NOT DETECTED NOT DETECTED   A.calcoaceticus-baumannii NOT DETECTED NOT DETECTED   Bacteroides fragilis NOT DETECTED NOT DETECTED   Enterobacterales NOT DETECTED NOT DETECTED   Enterobacter cloacae complex NOT DETECTED NOT DETECTED   Escherichia coli NOT DETECTED NOT DETECTED   Klebsiella aerogenes NOT DETECTED NOT DETECTED   Klebsiella oxytoca NOT DETECTED NOT DETECTED   Klebsiella pneumoniae NOT DETECTED NOT DETECTED   Proteus species NOT DETECTED NOT DETECTED   Salmonella species NOT  DETECTED NOT DETECTED   Serratia marcescens NOT DETECTED NOT DETECTED   Haemophilus influenzae NOT DETECTED NOT DETECTED   Neisseria meningitidis NOT DETECTED NOT DETECTED   Pseudomonas aeruginosa NOT DETECTED NOT DETECTED   Stenotrophomonas maltophilia NOT DETECTED NOT DETECTED   Candida albicans NOT DETECTED NOT DETECTED   Candida auris NOT DETECTED NOT DETECTED   Candida glabrata NOT DETECTED NOT DETECTED   Candida krusei NOT DETECTED NOT DETECTED   Candida parapsilosis NOT DETECTED NOT DETECTED   Candida tropicalis NOT DETECTED NOT DETECTED   Cryptococcus neoformans/gattii NOT DETECTED NOT DETECTED   Meth resistant mecA/C and MREJ NOT DETECTED NOT DETECTED    Thank you for involving pharmacy in this patient's care.  Loura Back, PharmD, BCPS Clinical Pharmacist Clinical phone for 04/21/2020 until 3p is [phone] 04/21/2020 9:01 AM  **Pharmacist phone directory can be found on [email or web address] listed under [facility]**

## 2020-04-22 ENCOUNTER — Inpatient Hospital Stay (HOSPITAL_COMMUNITY): Payer: 59

## 2020-04-22 DIAGNOSIS — R569 Unspecified convulsions: Secondary | ICD-10-CM | POA: Diagnosis not present

## 2020-04-22 DIAGNOSIS — I6340 Cerebral infarction due to embolism of unspecified cerebral artery: Secondary | ICD-10-CM | POA: Diagnosis not present

## 2020-04-22 DIAGNOSIS — I63412 Cerebral infarction due to embolism of left middle cerebral artery: Secondary | ICD-10-CM | POA: Diagnosis not present

## 2020-04-22 DIAGNOSIS — N179 Acute kidney failure, unspecified: Secondary | ICD-10-CM | POA: Diagnosis not present

## 2020-04-22 DIAGNOSIS — E872 Acidosis: Secondary | ICD-10-CM | POA: Diagnosis not present

## 2020-04-22 DIAGNOSIS — I639 Cerebral infarction, unspecified: Secondary | ICD-10-CM | POA: Diagnosis not present

## 2020-04-22 LAB — POCT I-STAT 7, (LYTES, BLD GAS, ICA,H+H)
Acid-base deficit: 7 mmol/L — ABNORMAL HIGH (ref 0.0–2.0)
Bicarbonate: 19.1 mmol/L — ABNORMAL LOW (ref 20.0–28.0)
Calcium, Ion: 1.18 mmol/L (ref 1.15–1.40)
HCT: 34 % — ABNORMAL LOW (ref 39.0–52.0)
Hemoglobin: 11.6 g/dL — ABNORMAL LOW (ref 13.0–17.0)
O2 Saturation: 100 %
Patient temperature: 99.1
Potassium: 4 mmol/L (ref 3.5–5.1)
Sodium: 145 mmol/L (ref 135–145)
TCO2: 20 mmol/L — ABNORMAL LOW (ref 22–32)
pCO2 arterial: 42.3 mmHg (ref 32.0–48.0)
pH, Arterial: 7.265 — ABNORMAL LOW (ref 7.350–7.450)
pO2, Arterial: 292 mmHg — ABNORMAL HIGH (ref 83.0–108.0)

## 2020-04-22 LAB — BASIC METABOLIC PANEL
Anion gap: 14 (ref 5–15)
BUN: 35 mg/dL — ABNORMAL HIGH (ref 6–20)
CO2: 15 mmol/L — ABNORMAL LOW (ref 22–32)
Calcium: 7.9 mg/dL — ABNORMAL LOW (ref 8.9–10.3)
Chloride: 111 mmol/L (ref 98–111)
Creatinine, Ser: 1.19 mg/dL (ref 0.61–1.24)
GFR, Estimated: >60 mL/min (ref >60)
Glucose, Bld: 173 mg/dL — ABNORMAL HIGH (ref 70–99)
Potassium: 3.8 mmol/L (ref 3.5–5.1)
Sodium: 140 mmol/L (ref 135–145)

## 2020-04-22 LAB — GLUCOSE, CAPILLARY
Glucose-Capillary: 149 mg/dL — ABNORMAL HIGH (ref 70–99)
Glucose-Capillary: 158 mg/dL — ABNORMAL HIGH (ref 70–99)
Glucose-Capillary: 161 mg/dL — ABNORMAL HIGH (ref 70–99)
Glucose-Capillary: 166 mg/dL — ABNORMAL HIGH (ref 70–99)
Glucose-Capillary: 219 mg/dL — ABNORMAL HIGH (ref 70–99)
Glucose-Capillary: 233 mg/dL — ABNORMAL HIGH (ref 70–99)

## 2020-04-22 LAB — CBC
HCT: 35.1 % — ABNORMAL LOW (ref 39.0–52.0)
Hemoglobin: 12.6 g/dL — ABNORMAL LOW (ref 13.0–17.0)
MCH: 33.9 pg (ref 26.0–34.0)
MCHC: 35.9 g/dL (ref 30.0–36.0)
MCV: 94.4 fL (ref 80.0–100.0)
Platelets: 47 10*3/uL — ABNORMAL LOW (ref 150–400)
RBC: 3.72 MIL/uL — ABNORMAL LOW (ref 4.22–5.81)
RDW: 12.5 % (ref 11.5–15.5)
WBC: 9.8 10*3/uL (ref 4.0–10.5)
nRBC: 0 % (ref 0.0–0.2)

## 2020-04-22 LAB — PHOSPHORUS: Phosphorus: 3.4 mg/dL (ref 2.5–4.6)

## 2020-04-22 LAB — MAGNESIUM: Magnesium: 2.6 mg/dL — ABNORMAL HIGH (ref 1.7–2.4)

## 2020-04-22 LAB — LACTIC ACID, PLASMA: Lactic Acid, Venous: 2.9 mmol/L (ref 0.5–1.9)

## 2020-04-22 MED ORDER — SODIUM CHLORIDE 0.9 % IV SOLN: INTRAVENOUS | Status: DC

## 2020-04-22 MED ORDER — MIDAZOLAM HCL 2 MG/2ML IJ SOLN: 2.0000 mg | Freq: Once | INTRAMUSCULAR | Status: AC

## 2020-04-22 MED ORDER — DEXMEDETOMIDINE HCL IN NACL 400 MCG/100ML IV SOLN: 0.4000 ug/kg/h | INTRAVENOUS | Status: DC

## 2020-04-22 MED ORDER — DOCUSATE SODIUM 50 MG/5ML PO LIQD
100.0000 mg | Freq: Two times a day (BID) | ORAL | Status: DC
  Administered 2020-04-23 (×2): 100 mg
  Filled 2020-04-22 (×2): qty 10

## 2020-04-22 MED ORDER — PROSOURCE TF PO LIQD
45.0000 mL | Freq: Two times a day (BID) | ORAL | Status: DC
  Administered 2020-04-23: 45 mL
  Filled 2020-04-22: qty 45

## 2020-04-22 MED ORDER — SODIUM BICARBONATE 8.4 % IV SOLN
INTRAVENOUS | Status: DC
  Filled 2020-04-22 (×4): qty 850

## 2020-04-22 MED ORDER — FENTANYL CITRATE (PF) 100 MCG/2ML IJ SOLN
INTRAMUSCULAR | Status: AC
  Administered 2020-04-22: 100 ug
  Filled 2020-04-22: qty 2

## 2020-04-22 MED ORDER — ETOMIDATE 2 MG/ML IV SOLN
INTRAVENOUS | Status: AC
  Administered 2020-04-22: 20 mg
  Filled 2020-04-22: qty 20

## 2020-04-22 MED ORDER — FENTANYL CITRATE (PF) 100 MCG/2ML IJ SOLN: 100.0000 ug | Freq: Once | INTRAMUSCULAR | Status: AC

## 2020-04-22 MED ORDER — ROCURONIUM BROMIDE 50 MG/5ML IV SOLN
70.0000 mg | Freq: Once | INTRAVENOUS | Status: AC
  Filled 2020-04-22: qty 7

## 2020-04-22 MED ORDER — DEXMEDETOMIDINE HCL IN NACL 400 MCG/100ML IV SOLN
0.0000 ug/kg/h | INTRAVENOUS | Status: DC
  Administered 2020-04-22 (×2): 0.4 ug/kg/h via INTRAVENOUS
  Filled 2020-04-22 (×2): qty 100

## 2020-04-22 MED ORDER — ALBUTEROL SULFATE (2.5 MG/3ML) 0.083% IN NEBU
2.5000 mg | INHALATION_SOLUTION | RESPIRATORY_TRACT | Status: DC | PRN
Start: 2020-04-22 — End: 2020-04-30

## 2020-04-22 MED ORDER — ETOMIDATE 2 MG/ML IV SOLN: 10.0000 mg | Freq: Once | INTRAVENOUS | Status: AC

## 2020-04-22 MED ORDER — LEVETIRACETAM IN NACL 1000 MG/100ML IV SOLN
1000.0000 mg | Freq: Two times a day (BID) | INTRAVENOUS | Status: DC
  Administered 2020-04-22 – 2020-04-25 (×6): 1000 mg via INTRAVENOUS
  Filled 2020-04-22 (×6): qty 100

## 2020-04-22 MED ORDER — POLYETHYLENE GLYCOL 3350 17 G PO PACK
17.0000 g | PACK | Freq: Every day | ORAL | Status: DC
  Administered 2020-04-22: 17 g
  Filled 2020-04-22: qty 1

## 2020-04-22 MED ORDER — ORAL CARE MOUTH RINSE
15.0000 mL | OROMUCOSAL | Status: DC
  Administered 2020-04-22 – 2020-04-30 (×75): 15 mL via OROMUCOSAL

## 2020-04-22 MED ORDER — FENTANYL CITRATE (PF) 100 MCG/2ML IJ SOLN
50.0000 ug | INTRAMUSCULAR | Status: DC | PRN
  Administered 2020-04-24: 50 ug via INTRAVENOUS
  Administered 2020-04-26: 100 ug via INTRAVENOUS
  Filled 2020-04-22: qty 4
  Filled 2020-04-22: qty 2

## 2020-04-22 MED ORDER — LACTATED RINGERS IV BOLUS
1000.0000 mL | Freq: Once | INTRAVENOUS | Status: AC
  Administered 2020-04-22: 1000 mL via INTRAVENOUS

## 2020-04-22 MED ORDER — VITAL HIGH PROTEIN PO LIQD
1000.0000 mL | ORAL | Status: DC
  Administered 2020-04-22: 1000 mL

## 2020-04-22 MED ORDER — CHLORHEXIDINE GLUCONATE 0.12% ORAL RINSE (MEDLINE KIT)
15.0000 mL | Freq: Two times a day (BID) | OROMUCOSAL | Status: DC
  Administered 2020-04-22 – 2020-04-30 (×16): 15 mL via OROMUCOSAL

## 2020-04-22 MED ORDER — ROCURONIUM BROMIDE 10 MG/ML (PF) SYRINGE
PREFILLED_SYRINGE | INTRAVENOUS | Status: AC
  Administered 2020-04-22: 70 mg
  Filled 2020-04-22: qty 10

## 2020-04-22 MED ORDER — FENTANYL CITRATE (PF) 100 MCG/2ML IJ SOLN
50.0000 ug | INTRAMUSCULAR | Status: DC | PRN
  Administered 2020-04-24: 50 ug via INTRAVENOUS

## 2020-04-22 MED ORDER — MIDAZOLAM HCL 2 MG/2ML IJ SOLN
INTRAMUSCULAR | Status: AC
  Administered 2020-04-22: 2 mg
  Filled 2020-04-22: qty 4

## 2020-04-22 NOTE — Consult Note (Signed)
NAME:  Zachary Woods, MRN:  [id number], DOB:  September 02, 1965, LOS: 2 ADMISSION DATE:  04/13/2020, CONSULTATION DATE:  2/18 REFERRING MD:  Dr. Madelyn Flavors , CHIEF COMPLAINT:  AMS with acute stroke   Brief History:  Zachary Woods is a 55 y.o. male who was admitted 2/18 with left MCA infarct and small right frontal SAH.  Required intubation for progressive AMS.  History of Present Illness:  Pt is encephelopathic; therefore, this HPI is obtained from chart review. Zachary Woods is a 55 y.o. male who has a PMH including but not limited to HTN, CVA, PFO s/p closure 2018, DM 2, GERD (see "past medical history" for rest).  He presented to [facility] ED 2/18 with right sided weakness.  He was last normal the night prior when he went to bed around 9PM.  Wife woke up around 3AM when pt was trying to get up to use the restroom but was unable to do so and slid from bed to ground (no loss of consciousness).   In ED, CT head demonstrated small volume SAH.  CTA / CTP demonstrated mod sized left MCA territory ischemic area.  He was initially admitted by [facility]; however, later in the day had progressive AMS to the point he required intubation.  PCCM subsequently called in consultation.  Per notes, neuro had raised concern for whether pt could have underlying endocarditis.  TTE did not suggest valvular vegetations, TEE pending.  Started on vanc / cefepime empirically.  Past Medical History:  has Hypertension; GERD (gastroesophageal reflux disease); Malignant hypertension; Cerebral embolism with cerebral infarction; Stroke determined by clinical assessment (HCC); PFO (patent foramen ovale); Acute CVA (cerebrovascular accident) (HCC); Subarachnoid hemorrhage (HCC); Type 2 diabetes mellitus with hyperosmolar hyperglycemic state (HHS) (HCC); AKI (acute kidney injury) (HCC); SIRS (systemic inflammatory response syndrome) (HCC); Metabolic acidosis with increased anion gap and accumulation of organic acids; Hypomagnesemia;  Hypokalemia; Thrombocytopenia (HCC); and Stroke [facility]) on their problem list.  Significant Hospital Events:  2/18 > admit, intubated.  Consults:  Neuro, Cards.  Procedures:    Significant Diagnostic Tests:  CT / CTA / CTP head and neck 2/18 > small volume right frontal SAH, left MCA occlusion, ischemic area of 37cc with core infarct 21cc, 60% prox left ICA stenosis. MRI brain 2/18 > mod to large acute left MCA infarct, 1.8cm acute cortical / subcortical left occipital lobe infarct, small volume right frontal lobe SAH. Echo 2/18 > EF 60 - 65%, no source of embolism detected. LE duplex 2/18 > neg. Carotid US 2/18 > 1 - 39% stenosis left ICA. EEG 2/18 > mod to diffuse encephalopathy without seizures or epileptiform discharges. TEE 2/18 > Chest x-ray 2/19- poor inspiratory effort but otherwise normal  Micro Data:  COVID 2/18 > neg. Flu 2/18 > neg. Blood cultures 2/18- MSSA positive. 2/19 negative.   Antimicrobials:  Vanc 2/18 >  Cefepime 2/18 >   Interim History / Subjective:  Remains somnolent with sonorous breathing.  Given metoprolol for tachycardia yesterday.   Objective   Blood pressure 124/70, pulse (!) 103, temperature 99.5 F (37.5 C), temperature source Oral, resp. rate (!) 24, height 5\' 7"  (1.702 m), weight 87.6 kg, SpO2 97 %.        Intake/Output Summary (Last 24 hours) at 04/22/2020 1052 Last data filed at 04/22/2020 1000 Gross per 24 hour  Intake 2802.46 ml  Output 1300 ml  Net 1502.46 ml   Filed Weights   04/12/2020 0400 04/21/20 0342 04/22/20 0400  Weight: 77.8  kg 82.4 kg 87.6 kg    Examination: General: Middle aged ill appearing male lying in bed in NAD HEENT: [place]/AT, MM pink/moist, PERRL,  Neuro: Opens eyes to verbal stimuli, remains obtunded not following commands for me, not moving right side. Moans incomprehensibly CV: s1s2 regular rate and rhythm, no murmur, rubs, or gallops,  PULM:  Clear bilaterally, tachypnea, no acute resp distress GI: soft,  bowel sounds active in all 4 quadrants, non-tender, non-distended Extremities: warm/dry, no edema  Skin: Occasional spider nevi over her upper chest. Excoriations on legs.  Resolved Hospital Problem list     Assessment & Plan:  Critically ill due to Left MCA stroke with left MCA branch occlusion Small right frontal subarachnoid MSSA bacteremia - possible skin source Hyperosmolar hyperglycemic state on background of poorly controlled diabetes with hemoglobin A1c 10.5% Uncontrolled type 2 diabetes  Acute Kidney Injury  History of hypertension Alcohol use Thrombocytopenia  Multifactorial encephalopathy  Plan:  -Narrowed antibiotic coverage to cefazolin, TEE pending -Maintain patient in upright position at high risk for aspiration and intubation -Continue current insulin regimen as glycemic control has improved - CT head stat as patient remains very encephalopathic, out of proportion to size of stroke on prior CT and despite appropriate antibiotics and blood sugar control. - Remains very high risk for neurological and respiratory decline.    Daily Goals Checklist  Pain/Anxiety/Delirium protocol (if indicated): None Neuro vitals: every 2 hours AED's: None VAP protocol (if indicated): Not intubated Respiratory support goals: Encourage deep breathing.  Titrate oxygen to keep sats greater than 88% Blood pressure target: Keep systolic less than 160 DVT prophylaxis: Heparin 3 times daily Nutrition Status:  Place core track to high risk for swallowing failure. GI prophylaxis: Not indicated Fluid status goals: Allow autoregulation at this time Urinary catheter: Use external catheter Central lines: No central line Glucose control: Type 2 diabetes acceptable control Mobility/therapy needs: Bedrest Antibiotic de-escalation: Cefazolin Home medication reconciliation: On hold Daily labs: CBC BMP Code Status: Full code Family Communication: Wife updated at bedside 2/20 Disposition:  ICU  Goals of Care:  Last date of multidisciplinary goals of care discussion:Pending  Family and staff present: Pending  Summary of discussion: Pending  Follow up goals of care discussion due: Pending  Code Status: Full  Labs   CBC: Recent Labs  Lab 04/24/20 0416 04/24/20 0424 2020-04-24 1643 04/24/2020 1714 04/21/20 0423 04/22/20 0345  WBC 6.7  --   --  12.0* 10.8* 9.8  NEUTROABS 6.1  --   --  11.0*  --   --   HGB 15.4 15.6 12.2* 13.5 13.4 12.6*  HCT 42.0 46.0 36.0* 37.9* 37.2* 35.1*  MCV 93.5  --   --  94.0 94.2 94.4  PLT 74*  --   --  62* 45* 47*    Basic Metabolic Panel: Recent Labs  Lab 2020-04-24 0416 04-24-2020 0424 April 24, 2020 1217 04-24-20 1643 2020/04/24 1714 04/21/20 0423 04/21/20 2141 04/22/20 0345  NA 124* 126* 126* 135 133* 136  --  140  K 2.8* 2.8* 2.9* 2.7* 2.8* 3.4*  --  3.8  CL 89* 88* 94*  --  103 108  --  111  CO2 19*  --  16*  --  18* 15*  --  15*  GLUCOSE 512* 522* 416*  --  253* 153*  --  173*  BUN 27* 27* 23*  --  20 23*  --  35*  CREATININE 1.55* 1.20 1.53*  --  1.23 1.24  --  1.19  CALCIUM 8.3*  --  8.0*  --  7.6* 7.7*  --  7.9*  MG 1.6*  --   --   --   --  2.1  --   --   PHOS  --   --   --   --   --  1.6* 2.6  --    GFR: Estimated Creatinine Clearance: 75 mL/min (by C-G formula based on SCr of 1.19 mg/dL). Recent Labs  Lab 04/18/2020 0416 04/27/2020 1217 04/07/2020 1714 04/11/2020 2112 04/21/20 0423 04/21/20 1815 04/22/20 0345  PROCALCITON  --   --  34.38  --   --   --   --   WBC 6.7  --  12.0*  --  10.8*  --  9.8  LATICACIDVEN  --  7.4* 5.8* 4.5*  --  4.1*  --     Liver Function Tests: Recent Labs  Lab 04/04/2020 0416  AST 40  ALT 23  ALKPHOS 127*  BILITOT 1.7*  PROT 6.2*  ALBUMIN 2.8*   No results for input(s): LIPASE, AMYLASE in the last 168 hours. No results for input(s): AMMONIA in the last 168 hours.  ABG    Component Value Date/Time   PHART 7.411 04/27/2020 1643   PCO2ART 28.2 (L) 04/21/2020 1643   PO2ART 77 (L) 04/19/2020  1643   HCO3 17.7 (L) 04/16/2020 1643   TCO2 19 (L) 04/11/2020 1643   ACIDBASEDEF 5.0 (H) 04/12/2020 1643   O2SAT 95.0 04/28/2020 1643     Coagulation Profile: Recent Labs  Lab 04/19/2020 0416  INR 1.3*    Cardiac Enzymes: No results for input(s): CKTOTAL, CKMB, CKMBINDEX, TROPONINI in the last 168 hours.  HbA1C: Hgb A1c MFr Bld  Date/Time Value Ref Range Status  04/11/2020 12:17 PM 10.5 (H) 4.8 - 5.6 % Final    Comment:    (NOTE) Pre diabetes:          5.7%-6.4%  Diabetes:              >6.4%  Glycemic control for   <7.0% adults with diabetes   08/04/2019 05:25 PM 8.2 (H) 4.8 - 5.6 % Final    Comment:             Prediabetes: 5.7 - 6.4          Diabetes: >6.4          Glycemic control for adults with diabetes: <7.0     CBG: Recent Labs  Lab 04/21/20 1600 04/21/20 1936 04/21/20 2310 04/22/20 0315 04/22/20 0741  GLUCAP 195* 185* 161* 158* 161*     Critical care time:    CRITICAL CARE Performed by: Lynnell Catalan   Total critical care time: 40 minutes  Critical care time was exclusive of separately billable procedures and treating other patients.  Critical care was necessary to treat or prevent imminent or life-threatening deterioration.  Critical care was time spent personally by me on the following activities: development of treatment plan with patient and/or surrogate as well as nursing, discussions with consultants, evaluation of patient's response to treatment, examination of patient, obtaining history from patient or surrogate, ordering and performing treatments and interventions, ordering and review of laboratory studies, ordering and review of radiographic studies, pulse oximetry, re-evaluation of patient's condition and participation in multidisciplinary rounds.  Lynnell Catalan, MD [facility] ICU Physician [facility]  Pager: [phone] Mobile: [phone] After hours: [phone].   04/22/2020, 10:52 AM

## 2020-04-22 NOTE — Progress Notes (Signed)
1505 RN returned to patient room after stepping out momentarily to find O2 sat 85% and dropping; RN called for NRB and second RN as NTS was performed; O2 sat increased to mid 90s. MD R. Agarwala made aware; orders for RSI placed--RT made aware to prepare for intubation; RT and MD at bedside.  See MAR for RSI drug administration.  Wife Marcelino Duster updated by phone by MD R. Agarwala.

## 2020-04-22 NOTE — Progress Notes (Signed)
MRI to be completed tomorrow when EEG leads come off.

## 2020-04-22 NOTE — Progress Notes (Signed)
vLTM EEG started following spot EEG. Notified neuro 

## 2020-04-22 NOTE — Progress Notes (Signed)
Brief Nutrition Note RD working remotely.   Consult received for enteral/tube feeding initiation and management. No documentation of enteral access at this time.   Adult Enteral Nutrition Protocol initiated. Full assessment to follow.  Admitting Dx: Hypokalemia [E87.6] Hyperglycemia [R73.9] CVA (cerebral vascular accident) (HCC) [I63.9] Stroke (HCC) [I63.9] Elevated LFTs [R79.89] Acute CVA (cerebrovascular accident) (HCC) [I63.9] Cerebrovascular accident (CVA), unspecified mechanism (HCC) [I63.9]  Body mass index is 31.17 kg/m. Pt meets criteria for obesity based on current BMI.  Labs:  Recent Labs  Lab 04/26/2020 0416 04/29/2020 0424 04/26/2020 1714 04/21/20 0423 04/21/20 2141 04/22/20 0345  NA 124*   < > 133* 136  --  140  K 2.8*   < > 2.8* 3.4*  --  3.8  CL 89*   < > 103 108  --  111  CO2 19*   < > 18* 15*  --  15*  BUN 27*   < > 20 23*  --  35*  CREATININE 1.55*   < > 1.23 1.24  --  1.19  CALCIUM 8.3*   < > 7.6* 7.7*  --  7.9*  MG 1.6*  --   --  2.1  --   --   PHOS  --   --   --  1.6* 2.6  --   GLUCOSE 512*   < > 253* 153*  --  173*   < > = values in this interval not displayed.       Trenton Gammon, MS, RD, LDN, CNSC Inpatient Clinical Dietitian RD pager # available in [facility]  After hours/weekend pager # available in [facility]

## 2020-04-22 NOTE — Progress Notes (Signed)
EEG completed, results pending. 

## 2020-04-22 NOTE — Procedures (Signed)
Patient Name: Zachary Woods  MRN: [id number]  Epilepsy Attending: Charlsie Quest  Referring Physician/Provider: Dr Lynnell Catalan Date: 04/22/2020 Duration: 22.29 mins  Patient history: 55 y.o.malewith history of previous cryptogenic Left MCA stroke s/p PFO closure, lost to stroke follow up 2018, DM2, HTN, GERDpresenting withaphasia, right facial droop and right sided weakness noted upon waking up at 0300 this morning. EEG to evaluate for seizure  Level of alertness: lethargic  AEDs during EEG study: None  Technical aspects: This EEG study was done with scalp electrodes positioned according to the 10-20 International system of electrode placement. Electrical activity was acquired at a sampling rate of 500Hz  and reviewed with a high frequency filter of 70Hz  and a low frequency filter of 1Hz . EEG data were recorded continuously and digitally stored.   Description: No posterior dominant rhythm was seen. EEG showed continuous generalized 3 to 6 Hz theta-delta slowing. Abundant ( one every 2-7 seconds) spikes were seen in left temporal region.  Hyperventilation and photic stimulation were not performed.     ABNORMALITY -Continuous slow, generalized -Spike, left temporal region.   IMPRESSION: This study showed evidence of potential epileptogenicity arising  From left temporal region as well as moderate diffuse encephalopathy, nonspecific etiology. No seizures were seen throughout the recording.  Dr was notified  

## 2020-04-22 NOTE — Procedures (Signed)
Intubation Procedure Note  LORAIN KEAST  [id number]  [birth year]  Date:04/22/20  Time:3:58 PM   Provider Performing:Natlie Asfour    Procedure: Intubation (35597)  Indication(s) Respiratory Failure  Consent Unable to obtain consent due to emergent nature of procedure.   Anesthesia Etomidate, Versed, Fentanyl and Rocuronium   Time Out Verified patient identification, verified procedure, site/side was marked, verified correct patient position, special equipment/implants available, medications/allergies/relevant history reviewed, required imaging and test results available.  Procedure Description Patient positioned in bed supine.  Sedation given as noted above.  Patient was intubated with endotracheal tube using MAC 4.  View was Grade 1 full glottis .  Number of attempts was 1.  Colorimetric CO2 detector was consistent with tracheal placement.  Dry secretions overlying glottis.  Complications/Tolerance None; patient tolerated the procedure well. Chest X-ray is ordered to verify placement.

## 2020-04-22 NOTE — Progress Notes (Addendum)
STROKE TEAM PROGRESS NOTE   INTERVAL HISTORY Remains critically ill with MSSA bacteremia/sepsis with fever 101.3,  persistent tachycardia, soft BP to mostly 110s systolic, AMS, tachypnea with high 20s to 30s. Leukocytosis improving, lactic acid trending down. Thrombocytopenia stable at 47.  Antibiotics narrowed to Ancef.   Global aphasia persists. Clinic exam remains stable.   Vitals:   04/22/20 0400 04/22/20 0500 04/22/20 0600 04/22/20 0825  BP: 111/79 112/82 121/84 (!) 130/92  Pulse: (!) 111 (!) 114 (!) 119 (!) 124  Resp: (!) 27 (!) 35 (!) 31 (!) 28  Temp: 99 F (37.2 C)     TempSrc: Oral     SpO2: 98% 98% 96% 96%  Weight: 87.6 kg     Height:  5\' 7"  (1.702 m)     CBC:  Recent Labs  Lab 2020-05-16 0416 05-16-2020 0424 May 16, 2020 1714 04/21/20 0423 04/22/20 0345  WBC 6.7  --  12.0* 10.8* 9.8  NEUTROABS 6.1  --  11.0*  --   --   HGB 15.4   < > 13.5 13.4 12.6*  HCT 42.0   < > 37.9* 37.2* 35.1*  MCV 93.5  --  94.0 94.2 94.4  PLT 74*  --  62* 45* 47*   < > = values in this interval not displayed.   Basic Metabolic Panel:  Recent Labs  Lab 05-16-20 0416 May 16, 2020 0424 04/21/20 0423 04/21/20 2141 04/22/20 0345  NA 124*   < > 136  --  140  K 2.8*   < > 3.4*  --  3.8  CL 89*   < > 108  --  111  CO2 19*   < > 15*  --  15*  GLUCOSE 512*   < > 153*  --  173*  BUN 27*   < > 23*  --  35*  CREATININE 1.55*   < > 1.24  --  1.19  CALCIUM 8.3*   < > 7.7*  --  7.9*  MG 1.6*  --  2.1  --   --   PHOS  --   --  1.6* 2.6  --    < > = values in this interval not displayed.   Lipid Panel:  Recent Labs  Lab 05-16-2020 1217  CHOL 98  TRIG 327*  HDL <10*  CHOLHDL NOT CALCULATED  VLDL 65*  LDLCALC NOT CALCULATED   HgbA1c:  Recent Labs  Lab 05-16-2020 1217  HGBA1C 10.5*   Urine Drug Screen:  Recent Labs  Lab 2020/05/16 1955  LABOPIA POSITIVE*  COCAINSCRNUR NONE DETECTED  LABBENZ NONE DETECTED  AMPHETMU NONE DETECTED  THCU NONE DETECTED  LABBARB NONE DETECTED    Alcohol Level  No results for input(s): ETH in the last 168 hours.  IMAGING past 24 hours DG CHEST PORT 1 VIEW  Result Date: 04/21/2020 CLINICAL DATA:  Aspiration pneumonia. EXAM: PORTABLE CHEST 1 VIEW COMPARISON:  05/16/20 FINDINGS: The heart size and mediastinal contours are within normal limits. Both lungs are clear. The visualized skeletal structures are unremarkable. IMPRESSION: No active disease. Electronically Signed   By: April 22, 2020 III M.D   On: 04/21/2020 10:03   PHYSICAL EXAM Physical Exam  Constitutional: Ill appearing middle-aged caucasian male with mildly labored respirations.  Psych: UTA Eyes: No scleral injection HENT: No OP obstrucion Head: Normocephalic.  Cardiovascular: Tachy rate and regular rhythm.  Respiratory: mild extra work of breathing Skin: WDI, face flushed.   Neuro: Mental Status: Alert with eyes open.  Globally aphasic.  Does not follow commands or mimic.  Head and neck is deviated to the left. Cranial Nerves: II: Does not cooperate with visual field testing, pupils are equal, round, and reactive to light.  Resists exam.  III,IV, VI: He has a left gaze preference and unable to look to the right. V: Unable to assess  VII: Facial movement with right facial weakness VIII: Hearing is intact to voice X: UTA XI: Does not follow commands.  XII: tongue is midline without atrophy or fasciculations.  Motor: Tone is normal. Bulk is normal. Antigravity strength was present on the left with spontaneous movement and resists exam with LUE, some withdrawal in the right leg, minimal movement right arm.  Sensory: Unable to fully assess d/t aphasia and altered mental status. Except with withdrawal as above.   Cerebellar: Does not follow commands for testing, right hemiplegia impairs exam.  ASSESSMENT/PLAN Zachary Woods is a 55 y.o. male with history of previous cryptogenic Left MCA stroke s/p PFO closure, lost to stroke follow up 2018, DM2, HTN, GERD presenting  with aphasia, right facial droop and right sided weakness noted upon waking up at 0300 this morning. He slid to the ground from the bed after awakening. EMS was called and he was brought to ED in code stroke status. NIHSS of 23 was noted upon arrival.   Left MCA stroke d/t left MCA branch occlusion with unknown source with small right frontal SAH of unknown etiology.  Apparently no trauma  per wife.   Related to thrombocytopenia?  Code Stroke HCT: small single sulcal SAH  CTA- occlusion of a trifurcation branch of the left MCA.  No aneurysm  CTP - Ischemic area 37cc, ischemic core 44ml by CBF<30%, CBV - 78ml  MRI - Moderate to large acute left MCA territory cortical/subcortical infarct affecting the left frontal and parietal lobes, as well as left insula and subinsular region. 1.8 cm acute cortical/subcortical left occipital lobe infarct, a few additional punctate acute infarcts are questioned within the right frontoparietal white matter (versus artifact). Redemonstrated small volume subarachnoid hemorrhage overlying the anterior right frontal lobe.  CT head x 3 - unchanged  MRI repeat pending  2D Echo EF 60-65%. No shunt. Septal Repair:25 mm Amplazter on 08/01/2016 noted.    TEE pending  Concern for seizure: EEG pending and LTM pending. Keppra initiated.   LDL   HgbA1c 10.5  Beta-2-glycoprotein pending d/t antiphospholipid syndrome  EEG showed moderate diffuse encephalopathy, nonspecific etiology. No seizures or epileptiform discharges were seen.   DVT PPX: SCDS  HOLD Antiplatelets due to thrombocytopenia (Plt 47, stable)  On ASA 81mg  PTA  NPO, Cortrak tube placement ordered per CCM  Therapy recommendations:  TBD  Disposition:  TBD  Dr. to bedside to discuss plan of care with Dr. Katrinka Blazing   Hx of stroke PFO closure  07/2016 patient had left MCA small infarct.  MRA head and neck negative.  DVT, TTE, pan CT negative.  TCD bubble study showed a small PFO.  TEE positive  for PFO.  LDL 68 and A1c 5.6.  THC positive.  Hypercoagulable work-up showed beta-2 glycoprotein IgG 57.  Discharged with aspirin.  PFO closure done in 08/2016.  Repeat TTE showed good PFO closure procedure.  He was put on DAPT and Lipitor after PFO closure.  Repeat beta-2 glycoprotein IgG in 11/2016 was 90.  However, patient was lost follow-up with neurology.  Antiphospholipid syndrome was not diagnosed and was not treated.  MSSA bacteremia Sepsis Septic shock  Tachycardia  to 130s persists, with tachypnea 20-30s   Tmax 101.3   Mild leukocytosis 10.8->9.8  Lactic acid 7.5->5.8->4.5  Antibiotics narrowed to cefepime   Serial Blood cultures pending  TEE pending once stable  ID on board  Management per CCM  Encephalopathy  Lethargic, less interactive  Likely due to multiple critical medical conditions  CT stat unchanged  MRI repeat pending  EEG left temporal sharps - on EEG LTM  On keppra 1g bid per CCM  Respiratory failure  Tachypnea with respiratory distress  Intubated 2/20  CCM on board  HTN . HTN meds at home: norvasc 10mg  daily, hctz 12.5mg  daily, not ordered. . Hypotension/soft BP intermittently in past 24 hours in the setting of sepsis . on MIVF at 150cc/hr.  . Keep BP at least greater than 100 systolic.  Long-term BP goal normotensive  Hyperlipidemia  Home meds: None. Per chart review he stopped statin 2018 d/t myalgia  TG 327   LDL direct 15.3, goal < 70  No statin indicated now given low LDL  Diabetes type II UnControlled  Home meds:  Glucophage 500mg  daily   HgbA1c 10.5, goal < 7.0  Hyperosmolar hyperglycemic on admission  Was on insulin drip, now off  CBGs  SSI  Close PCP follow up after discharge  Thrombocytopenia  Plt 74->62->45->47  Likely reactive to current critical illness  Hepatitis panel non-reactive  ASA discontinued  HOLD antiplatelet meds   Still on heparin subq Q8 - OK to continue for now  Other Stroke  Risk Factors   Former Cigarette smoker  Other Active Problems  AKI Ctn 1.55->1.23, resolving, Management per CCM  Metabolic Acidosis, Initial anion gap 16 with CO2 19, improving with anion gap 12 On sodium bicarb infusion Management per CCM  Hyponatremia, Na 124->126->135->133 Dehydration suspected, Improving, Management per [facility] day # 2     ATTENDING NOTE: I reviewed above note and agree with the assessment and plan. Pt was seen and examined.   Wife at the bedside. Pt still has low grade fever, lethargic with tachycardia and tachypnea. Not following commands. Nonverbal. Left gaze preference, not able to cross midline. Not blinking to visual threat bilaterally. Right facial droop. RUE and RLE largely flaccid on exam. LUE and LLE mild withdraw to pain.   Repeat CT no change. Due to respiratory distress pt was intubated. Will do MRI per CCM. EEG showed left temporal sharps, put on LTM EEG and keppra 1g bid per CCM. Detailed assessment plan please refer to above as I have made changes when appropriate.  , MD PhD Stroke Neurology 04/22/2020 6:16 PM  This patient is critically ill due to sepsis, septic shock, left MCA large infarct, right frontal SAH, thrombocytopenia, bacteremia, respiratory failure and at significant risk of neurological worsening, death form recurrent stroke, hemorrhagic conversion, seizure, septic shock, heart failure, renal failure. This patient's care requires constant monitoring of vital signs, hemodynamics, respiratory and cardiac monitoring, review of multiple databases, neurological assessment, discussion with family, other specialists and medical decision making of high complexity. I spent 40 minutes of neurocritical care time in the care of this patient. I have discussed with Dr. Marvel Plan.   To contact Stroke Continuity provider, please refer to 04/24/2020. After hours, contact General Neurology

## 2020-04-22 NOTE — Progress Notes (Signed)
Date and time results received: 04/22/20 1:45 PM  Test: lactic acid  Critical Value: 2.9   Name of Provider Notified: Herbert Spires MD  Orders Received? Or Actions Taken?:

## 2020-04-23 ENCOUNTER — Encounter (HOSPITAL_COMMUNITY): Admission: EM | Disposition: E | Payer: Self-pay | Source: Home / Self Care | Attending: Pulmonary Disease

## 2020-04-23 ENCOUNTER — Other Ambulatory Visit (HOSPITAL_COMMUNITY): Payer: 59

## 2020-04-23 DIAGNOSIS — R569 Unspecified convulsions: Secondary | ICD-10-CM | POA: Diagnosis not present

## 2020-04-23 DIAGNOSIS — R000 Tachycardia, unspecified: Secondary | ICD-10-CM

## 2020-04-23 DIAGNOSIS — B9561 Methicillin susceptible Staphylococcus aureus infection as the cause of diseases classified elsewhere: Secondary | ICD-10-CM

## 2020-04-23 DIAGNOSIS — R7881 Bacteremia: Secondary | ICD-10-CM | POA: Diagnosis not present

## 2020-04-23 DIAGNOSIS — I63312 Cerebral infarction due to thrombosis of left middle cerebral artery: Secondary | ICD-10-CM | POA: Diagnosis not present

## 2020-04-23 LAB — CBC
HCT: 33.6 % — ABNORMAL LOW (ref 39.0–52.0)
Hemoglobin: 11.5 g/dL — ABNORMAL LOW (ref 13.0–17.0)
MCH: 33.3 pg (ref 26.0–34.0)
MCHC: 34.2 g/dL (ref 30.0–36.0)
MCV: 97.4 fL (ref 80.0–100.0)
Platelets: 53 10*3/uL — ABNORMAL LOW (ref 150–400)
RBC: 3.45 MIL/uL — ABNORMAL LOW (ref 4.22–5.81)
RDW: 13.7 % (ref 11.5–15.5)
WBC: 7.3 10*3/uL (ref 4.0–10.5)
nRBC: 0 % (ref 0.0–0.2)

## 2020-04-23 LAB — MAGNESIUM
Magnesium: 2.6 mg/dL — ABNORMAL HIGH (ref 1.7–2.4)
Magnesium: 2.8 mg/dL — ABNORMAL HIGH (ref 1.7–2.4)

## 2020-04-23 LAB — URINE CULTURE: Culture: 50000 — AB

## 2020-04-23 LAB — BASIC METABOLIC PANEL
Anion gap: 12 (ref 5–15)
BUN: 66 mg/dL — ABNORMAL HIGH (ref 6–20)
CO2: 17 mmol/L — ABNORMAL LOW (ref 22–32)
Calcium: 7.8 mg/dL — ABNORMAL LOW (ref 8.9–10.3)
Chloride: 116 mmol/L — ABNORMAL HIGH (ref 98–111)
Creatinine, Ser: 1.62 mg/dL — ABNORMAL HIGH (ref 0.61–1.24)
GFR, Estimated: 50 mL/min — ABNORMAL LOW (ref >60)
Glucose, Bld: 289 mg/dL — ABNORMAL HIGH (ref 70–99)
Potassium: 3.8 mmol/L (ref 3.5–5.1)
Sodium: 145 mmol/L (ref 135–145)

## 2020-04-23 LAB — BETA-2-GLYCOPROTEIN I ABS, IGG/M/A
Beta-2 Glyco I IgG: 94 GPI IgG units — ABNORMAL HIGH (ref 0–20)
Beta-2-Glycoprotein I IgA: 52 GPI IgA units — ABNORMAL HIGH (ref 0–25)
Beta-2-Glycoprotein I IgM: <9 GPI IgM units (ref 0–32)

## 2020-04-23 LAB — GLUCOSE, CAPILLARY
Glucose-Capillary: 251 mg/dL — ABNORMAL HIGH (ref 70–99)
Glucose-Capillary: 241 mg/dL — ABNORMAL HIGH (ref 70–99)
Glucose-Capillary: 245 mg/dL — ABNORMAL HIGH (ref 70–99)
Glucose-Capillary: 228 mg/dL — ABNORMAL HIGH (ref 70–99)
Glucose-Capillary: 313 mg/dL — ABNORMAL HIGH (ref 70–99)
Glucose-Capillary: 358 mg/dL — ABNORMAL HIGH (ref 70–99)

## 2020-04-23 LAB — CULTURE, BLOOD (ROUTINE X 2): Special Requests: ADEQUATE

## 2020-04-23 LAB — CSF CELL COUNT WITH DIFFERENTIAL
Eosinophils, CSF: 0 % (ref 0–1)
Lymphs, CSF: 29 % — ABNORMAL LOW (ref 40–80)
Monocyte-Macrophage-Spinal Fluid: 0 % — ABNORMAL LOW (ref 15–45)
RBC Count, CSF: 57 /mm3 — ABNORMAL HIGH
Segmented Neutrophils-CSF: 71 % — ABNORMAL HIGH (ref 0–6)
Tube #: 3
WBC, CSF: 90 /mm3 (ref 0–5)

## 2020-04-23 LAB — PHOSPHORUS
Phosphorus: 2.2 mg/dL — ABNORMAL LOW (ref 2.5–4.6)
Phosphorus: 3.1 mg/dL (ref 2.5–4.6)

## 2020-04-23 LAB — PROTEIN AND GLUCOSE, CSF
Glucose, CSF: 104 mg/dL — ABNORMAL HIGH (ref 40–70)
Total  Protein, CSF: 117 mg/dL — ABNORMAL HIGH (ref 15–45)

## 2020-04-23 SURGERY — ECHOCARDIOGRAM, TRANSESOPHAGEAL
Anesthesia: Monitor Anesthesia Care

## 2020-04-23 MED ORDER — INSULIN ASPART 100 UNIT/ML ~~LOC~~ SOLN
4.0000 [IU] | SUBCUTANEOUS | Status: DC
  Administered 2020-04-23 – 2020-04-25 (×11): 4 [IU] via SUBCUTANEOUS

## 2020-04-23 MED ORDER — NOREPINEPHRINE 4 MG/250ML-% IV SOLN
0.0000 ug/min | INTRAVENOUS | Status: DC
  Administered 2020-04-23: 1 ug/min via INTRAVENOUS
  Administered 2020-04-24: 2 ug/min via INTRAVENOUS
  Filled 2020-04-23: qty 250

## 2020-04-23 MED ORDER — PROSOURCE TF PO LIQD
45.0000 mL | Freq: Three times a day (TID) | ORAL | Status: DC
  Administered 2020-04-23 – 2020-04-30 (×20): 45 mL
  Filled 2020-04-23 (×19): qty 45

## 2020-04-23 MED ORDER — EPINEPHRINE 1 MG/10ML IJ SOSY: 0.2000 mg | PREFILLED_SYRINGE | Freq: Once | INTRAMUSCULAR | Status: DC | PRN

## 2020-04-23 MED ORDER — PROPOFOL 1000 MG/100ML IV EMUL
5.0000 ug/kg/min | INTRAVENOUS | Status: DC
  Administered 2020-04-23: 10 ug/kg/min via INTRAVENOUS
  Administered 2020-04-23 – 2020-04-25 (×6): 20 ug/kg/min via INTRAVENOUS
  Filled 2020-04-23 (×7): qty 100

## 2020-04-23 MED ORDER — CHLORHEXIDINE GLUCONATE CLOTH 2 % EX PADS
6.0000 | MEDICATED_PAD | Freq: Every day | CUTANEOUS | Status: DC
  Administered 2020-04-25 – 2020-04-29 (×5): 6 via TOPICAL

## 2020-04-23 MED ORDER — MIDAZOLAM HCL 2 MG/2ML IJ SOLN
4.0000 mg | Freq: Once | INTRAMUSCULAR | Status: AC
  Administered 2020-04-23: 4 mg via INTRAVENOUS

## 2020-04-23 MED ORDER — VITAL 1.5 CAL PO LIQD
1000.0000 mL | ORAL | Status: DC
  Administered 2020-04-23 – 2020-04-29 (×6): 1000 mL
  Filled 2020-04-23 (×2): qty 1000

## 2020-04-23 MED ORDER — VALPROIC ACID 250 MG/5ML PO SOLN
500.0000 mg | Freq: Three times a day (TID) | ORAL | Status: DC
  Administered 2020-04-23 – 2020-04-30 (×20): 500 mg
  Filled 2020-04-23 (×20): qty 10

## 2020-04-23 MED ORDER — POTASSIUM PHOSPHATES 15 MMOLE/5ML IV SOLN
25.0000 mmol | Freq: Once | INTRAVENOUS | Status: AC
  Administered 2020-04-23: 25 mmol via INTRAVENOUS
  Filled 2020-04-23: qty 8.33

## 2020-04-23 MED ORDER — VALPROATE SODIUM 100 MG/ML IV SOLN
1000.0000 mg | Freq: Once | INTRAVENOUS | Status: AC
  Administered 2020-04-23: 1000 mg via INTRAVENOUS
  Filled 2020-04-23 (×2): qty 10

## 2020-04-23 MED ORDER — DIPHENHYDRAMINE HCL 50 MG/ML IJ SOLN: 25.0000 mg | Freq: Once | INTRAMUSCULAR | Status: DC | PRN

## 2020-04-23 MED ORDER — AMOXICILLIN 250 MG/5ML PO SUSR
250.0000 mg | Freq: Once | ORAL | Status: DC
  Filled 2020-04-23: qty 5

## 2020-04-23 MED ORDER — MIDAZOLAM HCL 2 MG/2ML IJ SOLN
INTRAMUSCULAR | Status: AC
  Filled 2020-04-23: qty 4

## 2020-04-23 MED ORDER — SODIUM CHLORIDE 0.9 % IV SOLN
2.0000 g | INTRAVENOUS | Status: DC
  Administered 2020-04-23 – 2020-04-24 (×5): 2 g via INTRAVENOUS
  Filled 2020-04-23 (×8): qty 2000

## 2020-04-23 NOTE — Progress Notes (Signed)
Inpatient Diabetes Program Recommendations  AACE/ADA: New Consensus Statement on Inpatient Glycemic Control   Target Ranges:  Prepandial:   less than 140 mg/dL      Peak postprandial:   less than 180 mg/dL (1-2 hours)      Critically ill patients:  140 - 180 mg/dL   Results for Zachary Woods, Zachary Woods (MRN [id number]) as of 04/12/2020 09:23  Ref. Range 04/22/2020 07:41 04/22/2020 11:22 04/22/2020 15:12 04/22/2020 19:32 04/22/2020 23:44 04/13/2020 03:24 04/20/2020 07:36  Glucose-Capillary Latest Ref Range: 70 - 99 mg/dL 381 (H) 829 (H) 937 (H) 219 (H) 233 (H) 251 (H) 241 (H)   Review of Glycemic Control  Diabetes history: DM2 Outpatient Diabetes medications: Metformin 500 mg QAM Current orders for Inpatient glycemic control: Novolog 0-15 units Q4H; Vital @ 40 ml/hr  Inpatient Diabetes Program Recommendations:    Insulin: Please consider ordering Novolog 5 units Q4H for tube feeding coverage. If tube feeding is stopped or held then Novolog tube feeding coverage should also be stopped or held.  Thanks, Orlando Penner, RN, MSN, CDE Diabetes Coordinator Inpatient Diabetes Program [phone] (Team Pager from 8am to 5pm)

## 2020-04-23 NOTE — Progress Notes (Addendum)
NAME:  Zachary Woods, MRN:  [id number], DOB:  06/15/1965, LOS: 3 ADMISSION DATE:  04/22/2020, CHIEF COMPLAINT: AMS in the setting of acute stroke   Brief History:  55 year old male who was admitted on 2/18 with left MCA infarction and small frontal SAH. Required intubation for progressive AMS.  History of Present Illness:  Zachary Woods is a 55 year old male with pertinent PMH of HTN, CVA, PFO s/p closure in 2018, DM 2, and GERD who presented to the ED with right sided weakness. He was last in his normal state of health the day prior. Overnight, the patient got up to go to the bathroom and could not move his right side and fell to the ground.   In ED, CT head shoed small volume SAH. CTA/CTP demonstrated moderate sized left MCA territory ischemic area.   Patient's AMS continued to progress and needed to be intubated. PCCM was subsequently consulted and patient was transferred/ admitted to Neuro CCU. Per note, neurology is worried about septic emboli and recommend a TEE looking for endocarditis and vegetative valvular lesions. Empiric Vanc/ Cefepime.   Past Medical History:  has Hypertension; GERD (gastroesophageal reflux disease); Malignant hypertension; Cerebral embolism with cerebral infarction; Stroke determined by clinical assessment (HCC); PFO (patent foramen ovale); Acute CVA (cerebrovascular accident) (HCC); Subarachnoid hemorrhage (HCC); Type 2 diabetes mellitus with hyperosmolar hyperglycemic state (HHS) (HCC); AKI (acute kidney injury) (HCC); SIRS (systemic inflammatory response syndrome) (HCC); Metabolic acidosis with increased anion gap and accumulation of organic acids; Hypomagnesemia; Hypokalemia; Thrombocytopenia (HCC); and Stroke (HCC) on their problem list.   Significant Hospital Events:  2/18 > admit, intubated  Consults:  Cardiology and Neurology   Procedures:    Significant Diagnostic Tests:  CT / CTA / CTP head and neck 2/18 > small volume right frontal SAH, left MCA  occlusion, ischemic area of 37cc with core infarct 21cc, 60% prox left ICA stenosis. MRI brain 2/18 > mod to large acute left MCA infarct, 1.8cm acute cortical / subcortical left occipital lobe infarct, small volume right frontal lobe SAH. Echo 2/18 > EF 60 - 65%, no source of embolism detected. LE duplex 2/18 > neg. Carotid US 2/18 > 1 - 39% stenosis left ICA. EEG 2/18 > mod to diffuse encephalopathy without seizures or epileptiform discharges. TEE 2/18 > Chest x-ray 2/19-poor inspiratory effort but otherwise normal  Micro Data:  COVID 2/18 > neg. Flu 2/18 > neg. Blood cultures 2/18- MSSA positive. 2/19 negative.   Antimicrobials:  Cefazolin  Interim History / Subjective:  Patient remains sedated and does not respond to voice or painful stimuli. Given Levophed for episodes of hypotension  Objective   Blood pressure 117/71, pulse (!) 126, temperature (!) 100.6 F (38.1 C), temperature source Axillary, resp. rate (!) 31, height 5\' 6"  (1.676 m), weight 87.6 kg, SpO2 99 %.    Vent Mode: PRVC FiO2 (%):  [40 %-100 %] 40 % Set Rate:  [18 bmp] 18 bmp Vt Set:  [510 mL] 510 mL PEEP:  [8 cmH20-10 cmH20] 8 cmH20 Plateau Pressure:  [6 cmH20-22 cmH20] 22 cmH20   Intake/Output Summary (Last 24 hours) at 02-May-2020 1330 Last data filed at 05/02/20 1200 Gross per 24 hour  Intake 3910.25 ml  Output 725 ml  Net 3185.25 ml   Filed Weights   04/03/2020 0400 04/21/20 0342 04/22/20 0400  Weight: 77.8 kg 82.4 kg 87.6 kg    Examination: General: Sedated. Unresponsive to voice.  HENT: Atraumatic. Eye constricted and equal. Non-reactive to  light.  Neck: slightly rigid on examination Lungs: Clear to auscultation. Cardiovascular: Tachycardic, but normal rhythm. No M/R/G  Extremities: warm/dry, no edema Neuro: No response to voice or painful stimuli. CNII/III: Pupils constricted without reactivity to light. Doll's eyes negative. CN V/VII: Corneal reflex normal on left eye, but not on right. CN X:  cough reflex intact Skin: Spider nevi throughout chest, back, and lower extremities GU: Catheter in place   Resolved Hospital Problem list     Assessment & Plan:  Critically ill due to Left MCA stroke with left MCA branch occlusion with small right frontal subarachnoid. Multifactorial encephalopathy -Continue Keppra 1000mg  IV Q12h  - Obtain a Lumbar Puncture for progressive encephalopathy and nuchal rigidity for possible meningitis  - Continue EEG monitoring   MSSA bacteremia - possible skin source, sepsis: - Hold TF at midnight, TEE procedure pending tomorrow at bedside - Continue Cefazolin per ID recs  - Pending CF cultures and results. Appreciate ID recs for Amoxicillin challenge with plan to change for Nafcillin.  - F/u on MRI   Uncontrolled type 2 diabetes  - Continue Novolog for TF coverage - Consider adding nightly Levemir, due to high morning CBGs     Best practice (evaluated daily)  Pain/Anxiety/Delirium protocol (if indicated): None Neuro vitals: every 2 hours AED's: None VAP protocol (if indicated): Not intubated Respiratory support goals: Encourage deep breathing.  Titrate oxygen to keep sats greater than 88% Blood pressure target: Keep systolic less than 160 DVT prophylaxis: Heparin 3 times daily Nutrition Status:  HOLD for TEE GI prophylaxis: Not indicated Fluid status goals: Allow autoregulation at this time Urinary catheter: Use external catheter Central lines: No central line Glucose control: Type 2 diabetes acceptable control Mobility/therapy needs: Bedrest Antibiotic de-escalation: Cefazolin Home medication reconciliation: On hold Daily labs: CBC BMP Code Status: Full code Family Communication: Wife updated at bedside 2/20 Disposition: ICU  Goals of Care:  Last date of multidisciplinary goals of care discussion:Pending  Family and staff present: Pending  Summary of discussion: Pending  Follow up goals of care discussion due: Pending  Code Status:  Full  Labs   CBC: Recent Labs  Lab May 10, 2020 0416 May 10, 2020 0424 05/10/2020 1714 04/21/20 0423 04/22/20 0345 04/22/20 1706 04/13/2020 0405  WBC 6.7  --  12.0* 10.8* 9.8  --  7.3  NEUTROABS 6.1  --  11.0*  --   --   --   --   HGB 15.4   < > 13.5 13.4 12.6* 11.6* 11.5*  HCT 42.0   < > 37.9* 37.2* 35.1* 34.0* 33.6*  MCV 93.5  --  94.0 94.2 94.4  --  97.4  PLT 74*  --  62* 45* 47*  --  53*   < > = values in this interval not displayed.    Basic Metabolic Panel: Recent Labs  Lab 05/10/2020 0416 05/10/2020 0424 05-10-20 1217 May 10, 2020 1643 05/10/2020 1714 04/21/20 0423 04/21/20 2141 04/22/20 0345 04/22/20 1604 04/22/20 1706 04/16/2020 0405  NA 124*   < > 126*   < > 133* 136  --  140  --  145 145  K 2.8*   < > 2.9*   < > 2.8* 3.4*  --  3.8  --  4.0 3.8  CL 89*   < > 94*  --  103 108  --  111  --   --  116*  CO2 19*  --  16*  --  18* 15*  --  15*  --   --  17*  GLUCOSE 512*   < > 416*  --  253* 153*  --  173*  --   --  289*  BUN 27*   < > 23*  --  20 23*  --  35*  --   --  66*  CREATININE 1.55*   < > 1.53*  --  1.23 1.24  --  1.19  --   --  1.62*  CALCIUM 8.3*  --  8.0*  --  7.6* 7.7*  --  7.9*  --   --  7.8*  MG 1.6*  --   --   --   --  2.1  --   --  2.6*  --  2.6*  PHOS  --   --   --   --   --  1.6* 2.6  --  3.4  --  2.2*   < > = values in this interval not displayed.   GFR: Estimated Creatinine Clearance: 54 mL/min (A) (by C-G formula based on SCr of 1.62 mg/dL (H)). Recent Labs  Lab 04/19/2020 1714 04/29/2020 2112 04/21/20 0423 04/21/20 1815 04/22/20 0345 04/22/20 1112 05/01/2020 0405  PROCALCITON 34.38  --   --   --   --   --   --   WBC 12.0*  --  10.8*  --  9.8  --  7.3  LATICACIDVEN 5.8* 4.5*  --  4.1*  --  2.9*  --     Liver Function Tests: Recent Labs  Lab 04/12/2020 0416  AST 40  ALT 23  ALKPHOS 127*  BILITOT 1.7*  PROT 6.2*  ALBUMIN 2.8*   No results for input(s): LIPASE, AMYLASE in the last 168 hours. No results for input(s): AMMONIA in the last 168  hours.  ABG    Component Value Date/Time   PHART 7.265 (L) 04/22/2020 1706   PCO2ART 42.3 04/22/2020 1706   PO2ART 292 (H) 04/22/2020 1706   HCO3 19.1 (L) 04/22/2020 1706   TCO2 20 (L) 04/22/2020 1706   ACIDBASEDEF 7.0 (H) 04/22/2020 1706   O2SAT 100.0 04/22/2020 1706     Coagulation Profile: Recent Labs  Lab 04/05/2020 0416  INR 1.3*    Cardiac Enzymes: No results for input(s): CKTOTAL, CKMB, CKMBINDEX, TROPONINI in the last 168 hours.  HbA1C: Hgb A1c MFr Bld  Date/Time Value Ref Range Status  04/21/2020 12:17 PM 10.5 (H) 4.8 - 5.6 % Final    Comment:    (NOTE) Pre diabetes:          5.7%-6.4%  Diabetes:              >6.4%  Glycemic control for   <7.0% adults with diabetes   08/04/2019 05:25 PM 8.2 (H) 4.8 - 5.6 % Final    Comment:             Prediabetes: 5.7 - 6.4          Diabetes: >6.4          Glycemic control for adults with diabetes: <7.0     CBG: Recent Labs  Lab 04/22/20 1932 04/22/20 2344 01-May-2020 0324 05-01-20 0736 05-01-20 1216  GLUCAP 219* 233* 251* 241* 245*    Past Medical History:  He,  has a past medical history of Acid reflux, CVA (cerebral vascular accident) (HCC), Diabetes mellitus, type 2 (HCC), and Hypertension.   Surgical History:   Past Surgical History:  Procedure Laterality Date  . PATENT FORAMEN OVALE(PFO) CLOSURE N/A 08/01/2016   Procedure: Patent Maryann Conners  [place](PFO) Closure;  Surgeon: Tonny Bollmanooper, Michael, MD;  [facility];  Service: Cardiovascular;  Laterality: N/A;  . TEE WITHOUT CARDIOVERSION N/A 07/21/2016   Procedure: TRANSESOPHAGEAL ECHOCARDIOGRAM (TEE);  Surgeon: Lars MassonNelson, Katarina H, MD;  [facility];  Service: Cardiovascular;  Laterality: N/A;     Social History:   reports that he has quit smoking. He has never used smokeless tobacco. He reports current alcohol use of about 1.0 standard drink of alcohol per week. He reports that he does not use drugs.   Family History:  His family history  includes Diabetes in his brother and father.   Allergies Allergies  Allergen Reactions  . Penicillins Itching    Has patient had a PCN reaction causing immediate rash, facial/tongue/throat swelling, SOB or lightheadedness with hypotension: No Has patient had a PCN reaction causing severe rash involving mucus membranes or skin necrosis: No Has patient had a PCN reaction that required hospitalization: No Has patient had a PCN reaction occurring within the last 10 years: Yes If all of the above answers are "NO", then may proceed with Cephalosporin use.     Home Medications  Prior to Admission medications   Medication Sig Start Date End Date Taking? Authorizing Provider  amLODipine (NORVASC) 10 MG tablet Take 1 tablet (10 mg total) by mouth daily. 02/04/18  Yes Shade FloodGreene, Jeffrey R, MD  aspirin EC 81 MG tablet Take 1 tablet (81 mg total) by mouth daily. 08/05/17  Yes Janetta Horahompson, Kathryn R, PA-C  ibuprofen (ADVIL) 200 MG tablet Take 400 mg by mouth every 6 (six) hours as needed for headache or mild pain.   Yes [provider]  meloxicam (MOBIC) 7.5 MG tablet Take 7.5 mg by mouth daily. 06/17/19  Yes [provider]  metFORMIN (GLUCOPHAGE) 500 MG tablet Take 1 tablet (500 mg total) by mouth daily with breakfast. 08/04/19  Yes Shade FloodGreene, Jeffrey R, MD  Multiple Vitamin (MULTIVITAMIN WITH MINERALS) TABS tablet Take 1 tablet by mouth daily.   Yes [provider]  omeprazole (PRILOSEC OTC) 20 MG tablet Take 20 mg by mouth daily.   Yes [provider]  pantoprazole (PROTONIX) 40 MG tablet TAKE 1 TABLET BY MOUTH EVERY DAY Patient taking differently: Take 40 mg by mouth daily. 03/25/17  Yes Tonny Bollmanooper, Michael, MD  sildenafil (VIAGRA) 100 MG tablet Take 0.5-1 tablets (50-100 mg total) by mouth daily as needed for erectile dysfunction. 11/27/16  Yes Shade FloodGreene, Jeffrey R, MD  Tetrahydrozoline HCl (VISINE OP) Place 1 drop into both eyes daily as needed (dry eyes).   Yes [provider]  hydrochlorothiazide (MICROZIDE) 12.5 MG capsule TAKE 1 CAPSULE BY MOUTH EVERY DAY Patient not taking: No sig reported 06/23/18   Shade FloodGreene, Jeffrey R, MD     Critical care time:       Andreas NewportHenry Sueanne Maniaci, MS4

## 2020-04-23 NOTE — Progress Notes (Addendum)
I have seen and examined the patient. I have personally reviewed the clinical findings, laboratory findings, microbiological data and imaging studies. The assessment and treatment plan was discussed with the  Advance Practice Provider, Zachary Woods.  I agree with her/his recommendations except following additions/corrections.  Discussed with ICU attending Dr Zachary Woods. Plan for MRI brain and LP noted given mental status changes unexplained by Left MCA infarct   On conversation with the wife yesterday, h/o penicillin allergy sounded to be remote ( several years ago) with rashes and hives. Will clarify with the wife today for oral amoxicillin challenge and plan to switch cefazolin to IV Nafcillin for better CNS coverage.  Exam - several erythematous lesions noted in the bilateral hand and feet concerning for [place] lesions   EEG moderate diffuse encephalopathy, nonspecific etiology.No seizures or epileptiform discharges TEE is pending to r/o endocarditis  Fu blood cultures from 2/19, agree with repeating blood cultures today ( 2/21) for having serial negative blood cultures for documenting clearance ( ? Concerns for skip phenomenon with staph bacteremia) Vent and Stroke management per Neurology/CCM  Following   Zachary Oz, MD [place] for [facility] for Infectious Disease  Date of Admission:  04/03/2020      Total days of antibiotics 4  Cefazolin           ASSESSMENT:  Zachary Woods is a 55 y.o. male admitted with R sided weakness. Found to have MCA territory ischemic stroke and subsequent R frontal bleed in the wetting of MSSA bacteremia.   D/W Dr. Ashley Woods - was intubated over the weekend for progressively worsening encephalopathy and inability to protect airway. Would have expected improvement in mental status not decline as we have seen. Ongoing fevers with no growth thus far from repeated blood cultures.  He has stigmata on the hands and feet suspicious for left sided endocarditis in addition to acute stroke finding with bacteremia. TEE pending (maybe today?) - Agree with LP and MRI to re-assess meningitis. Will plan on changing cefazolin to naficillin for better CNS coverage- has a history of PCN allergy (hives/itching); discussed briefly with wife yesterday and seems to have been non-severe and several years ago. Have left a message with her again to call back to clarify. We feel comfortable proceeding with Amoxicillin challenge with a dose and close monitoring in ICU today with trial on Nafcillin thereafter.   Repeat blood cultures today to ensure no ongoing bacteremia. Hold on central line if hemodynamics permit.     PLAN: 1. Will plan amoxicillin challenge today w/ plan to change to either nafcillin (preferred) after LP today for improved CNS coverage  2. Follow MRI and LP findings  3. Hold on central access if able   Principal Problem:   MSSA bacteremia Active Problems:   Acute CVA (cerebrovascular accident) ([place])   Subarachnoid hemorrhage ([place])   Type 2 diabetes mellitus with hyperosmolar hyperglycemic state (HHS) ([place])   AKI (acute kidney injury) ([place])   SIRS (systemic inflammatory response syndrome) (HCC)   Metabolic acidosis with increased anion gap and accumulation of organic acids   Hypomagnesemia   Hypokalemia   Thrombocytopenia ([place])   Stroke ([place])   . chlorhexidine gluconate (MEDLINE KIT)  15 mL Mouth Rinse BID  . [START ON 04/24/2020] Chlorhexidine Gluconate Cloth  6 each Topical Q0600  . docusate  100 mg Per Tube BID  . feeding supplement (PROSource TF)  45 mL Per  Tube BID  . feeding supplement (VITAL HIGH PROTEIN)  1,000 mL Per Tube Q24H  . folic acid  1 mg Oral Daily  . heparin  5,000 Units Subcutaneous Q8H  . insulin aspart  0-15 Units Subcutaneous Q4H  . mouth rinse  15 mL Mouth Rinse 10 times per day  . multivitamin with minerals  1 tablet Oral Daily  .  polyethylene glycol  17 g Per Tube Daily  . sodium chloride flush  3 mL Intravenous Once  . thiamine  100 mg Oral Daily    SUBJECTIVE: Intubated    Review of Systems: Review of Systems  Unable to perform ROS: Patient nonverbal    Allergies  Allergen Reactions  . Penicillins Itching    Has patient had a PCN reaction causing immediate rash, facial/tongue/throat swelling, SOB or lightheadedness with hypotension: No Has patient had a PCN reaction causing severe rash involving mucus membranes or skin necrosis: No Has patient had a PCN reaction that required hospitalization: No Has patient had a PCN reaction occurring within the last 10 years: Yes If all of the above answers are "NO", then may proceed with Cephalosporin use.    OBJECTIVE: Vitals:   04/22/2020 1000 04/09/2020 1100 04/11/2020 1156 04/28/2020 1200  BP: 112/75 128/82  117/71  Pulse: 95 (!) 104  (!) 126  Resp: (!) 29 (!) 30  (!) 31  Temp:    (!) 100.6 F (38.1 C)  TempSrc:    Axillary  SpO2: 100% 100% 99% 99%  Weight:      Height:       Body mass index is 31.17 kg/m.  Physical Exam Constitutional:      Comments: Not responding to tactile or verbal stimuli. EEG ongoing.   Cardiovascular:     Rate and Rhythm: Regular rhythm. Tachycardia present.     Heart sounds: No murmur heard.   Pulmonary:     Effort: Pulmonary effort is normal.     Breath sounds: Normal breath sounds.  Abdominal:     General: Bowel sounds are normal.     Palpations: Abdomen is soft.  Musculoskeletal:        General: No swelling.     Cervical back: Normal range of motion. No rigidity.  Skin:    General: Skin is warm.     Findings: Lesion and rash (No lesions or rashes to abdomen aside from large purple ecchymosis to RLQ ) present.     Comments: Scattered lesions to feet and hands that are not blanchable. Hands are raised in places. Largest evolved sanguino-pustular appearing lesion to right fingers  Neurological:     Comments: Unresponsive  on ventilator     Lab Results Lab Results  Component Value Date   WBC 7.3 04/06/2020   HGB 11.5 (L) 04/16/2020   HCT 33.6 (L) 04/29/2020   MCV 97.4 04/05/2020   PLT 53 (L) 04/15/2020    Lab Results  Component Value Date   CREATININE 1.62 (H) 04/17/2020   BUN 66 (H) 04/18/2020   NA 145 04/22/2020   K 3.8 04/08/2020   CL 116 (H) 04/10/2020   CO2 17 (L) 04/07/2020    Lab Results  Component Value Date   ALT 23 04/15/2020   AST 40 04/14/2020   ALKPHOS 127 (H) 04/11/2020   BILITOT 1.7 (H) 04/11/2020     Microbiology: BCx    Zachary Madeira, MSN, NP-C [place] for Infectious Disease [email or web address] Pager: [phone] Office: [phone] [facility] Main Line:  [phone]  

## 2020-04-23 NOTE — Progress Notes (Signed)
Date and time results received: 04/15/2020 1550 (use smartphrase ".now" to insert current time)  Test: CSF Critical Value: WBC, CSF 90   Name of Provider Notified: Dr. Nile Riggs Medical Student   Orders Received? Or Actions Taken?: None   Zachary Woods, [place] Scrape, RN

## 2020-04-23 NOTE — Progress Notes (Signed)
STROKE TEAM PROGRESS NOTE   INTERVAL HISTORY  Intubated yesterday afternoon for airway protection. On precedex gtt..  Remains critically ill with MSSA bacteremia/sepsis, fever to 103, hypotension requiring Levophed gtt, persistent encephalopathy. EEG with left temporal region spike. LTM in progress. Keppra initiated yesterday per CCM.  TEE planned for today.  Vitals:   Apr 26, 2020 0400 Apr 26, 2020 0500 04/26/20 0600 04/26/2020 0700  BP: 94/68 90/60 101/67 (!) 89/67  Pulse: 92 91 92 93  Resp: (!) 27 (!) 27 (!) 24 (!) 31  Temp: (!) 101.8 F (38.8 C)     TempSrc: Axillary     SpO2: 100% 100% 100% 100%  Weight:      Height:       CBC:  Recent Labs  Lab 04/14/2020 0416 04/04/2020 0424 04/18/2020 1714 04/21/20 0423 04/22/20 0345 04/22/20 1706 04-26-2020 0405  WBC 6.7  --  12.0*   < > 9.8  --  7.3  NEUTROABS 6.1  --  11.0*  --   --   --   --   HGB 15.4   < > 13.5   < > 12.6* 11.6* 11.5*  HCT 42.0   < > 37.9*   < > 35.1* 34.0* 33.6*  MCV 93.5  --  94.0   < > 94.4  --  97.4  PLT 74*  --  62*   < > 47*  --  53*   < > = values in this interval not displayed.   Basic Metabolic Panel:  Recent Labs  Lab 04/22/20 0345 04/22/20 1604 04/22/20 1706 04/26/2020 0405  NA 140  --  145 145  K 3.8  --  4.0 3.8  CL 111  --   --  116*  CO2 15*  --   --  17*  GLUCOSE 173*  --   --  289*  BUN 35*  --   --  66*  CREATININE 1.19  --   --  1.62*  CALCIUM 7.9*  --   --  7.8*  MG  --  2.6*  --  2.6*  PHOS  --  3.4  --  2.2*   Lipid Panel:  Recent Labs  Lab 04/22/2020 1217  CHOL 98  TRIG 327*  HDL <10*  CHOLHDL NOT CALCULATED  VLDL 65*  LDLCALC NOT CALCULATED   HgbA1c:  Recent Labs  Lab 04/24/2020 1217  HGBA1C 10.5*   Urine Drug Screen:  Recent Labs  Lab 04/04/2020 1955  LABOPIA POSITIVE*  COCAINSCRNUR NONE DETECTED  LABBENZ NONE DETECTED  AMPHETMU NONE DETECTED  THCU NONE DETECTED  LABBARB NONE DETECTED    Alcohol Level No results for input(s): ETH in the last 168 hours.  IMAGING past 24  hours EEG  Result Date: 04/22/2020 Charlsie Quest, MD     04/22/2020  2:35 PM Patient Name: Zachary Woods MRN: [id number] Epilepsy Attending: Charlsie Quest Referring Physician/Provider: Dr Lynnell Catalan Date: 04/22/2020 Duration: 22.29 mins Patient history: 55 y.o.malewith history of previous cryptogenic Left MCA stroke s/p PFO closure, lost to stroke follow up 2018, DM2, HTN, GERDpresenting withaphasia, right facial droop and right sided weakness noted upon waking up at 0300 this morning. EEG to evaluate for seizure Level of alertness: lethargic AEDs during EEG study: None Technical aspects: This EEG study was done with scalp electrodes positioned according to the 10-20 International system of electrode placement. Electrical activity was acquired at a sampling rate of 500Hz  and reviewed with a high frequency filter of 70Hz  and a low  frequency filter of 1Hz . EEG data were recorded continuously and digitally stored. Description: No posterior dominant rhythm was seen. EEG showed continuous generalized 3 to 6 Hz theta-delta slowing. Abundant ( one every 2-7 seconds) spikes were seen in left temporal region.  Hyperventilation and photic stimulation were not performed.   ABNORMALITY -Continuous slow, generalized -Spike, left temporal region. IMPRESSION: This study showed evidence of potential epileptogenicity arising  From left temporal region as well as moderate diffuse encephalopathy, nonspecific etiology. No seizures were seen throughout the recording. Dr was notified Denese Killings   CT HEAD WO CONTRAST  Result Date: 04/22/2020 CLINICAL DATA:  Stroke follow-up EXAM: CT HEAD WITHOUT CONTRAST TECHNIQUE: Contiguous axial images were obtained from the base of the skull through the vertex without intravenous contrast. COMPARISON:  April 21, 2020 FINDINGS: Brain: The large left MCA territory infarct is not significantly changed in size in the interval. Wispy high attenuation within the infarct  is similar in the interval and could represent a small amount of associated blood products. A small left occipital infarct is stable. No midline shift identified. The cerebellum, brainstem, and basal cisterns are stable and unremarkable. Ventricles and sulci are stable. The subarachnoid hemorrhage adjacent to the frontal lobe on the right is stable. No other interval changes. Vascular: Calcified atherosclerosis in the intracranial carotids. Skull: Normal. Negative for fracture or focal lesion. Sinuses/Orbits: No acute finding. Other: None. IMPRESSION: 1. Non progressed left cerebral infarcts and right frontal subarachnoid hemorrhage. No significant interval change. Electronically Signed   By: April 23, 2020 III M.D   On: 04/22/2020 12:31   DG CHEST PORT 1 VIEW  Result Date: 04/22/2020 CLINICAL DATA:  55 year old male status post intubation. EXAM: PORTABLE CHEST 1 VIEW COMPARISON:  Earlier radiograph dated 04/21/2020. FINDINGS: Endotracheal tube with tip approximately 4.5 cm above the carina. Enteric tube extends below the diaphragm with side-port in the left upper abdomen and tip beyond the inferior margin of the image. Patchy right lung base consolidation, new since the prior radiograph and may represent atelectasis. Infiltrate is not excluded clinical correlation is recommended. There is mild eventration of the right hemidiaphragm. No pleural effusion or pneumothorax. Mild cardiomegaly. Amplatzer device noted. No acute osseous pathology. IMPRESSION: 1. Endotracheal tube above the carina. 2. Patchy right lung base consolidation, new since the prior radiograph and may represent atelectasis. Electronically Signed   By: 04/14/2020 M.D.   On: 04/22/2020 16:03   PHYSICAL EXAM Physical Exam  Constitutional: Critically ill intubated middle aged male lying in bed with eyes closed.  Psych: UTA Eyes: No scleral injection HENT: No OP obstrucion Head: Normocephalic.  Cardiovascular: Regular rate and regular  rhythm.  Respiratory: Intubated on vent at 40% FiO2 Skin: WDI, face flushed  Neuro: Mental Status: Intubated sedated. No response to tactile or verbal stimuli.   Cranial Nerves: With forced eye opening, eyes in mid position, not blinking to visual threat, doll's eyes present, not tracking, b/l pupil sluggish to light. Corneal reflex weakly present on the left but absent on the right. Gag and cough present. Breathing over the vent.    Motor: Tone is normal. Bulk is normal. No withdrawal to painful stimuli x 4 extremities Sensory: No withdrawal to painful stimuli x 4 extremities Cerebellar: UTA  ASSESSMENT/PLAN Zachary Woods is a 55 y.o. male with history of previous cryptogenic Left MCA stroke s/p PFO closure, lost to stroke follow up 2018, DM2, HTN, GERD presenting with aphasia, right facial droop and right sided weakness noted  upon waking up at 0300 this morning. He slid to the ground from the bed after awakening. EMS was called and he was brought to ED in code stroke status. NIHSS of 23 was noted upon arrival.   Left MCA stroke d/t left MCA branch occlusion with unknown source with small right frontal SAH of unknown etiology.  Apparently no trauma  per wife.   Related to thrombocytopenia?  Code Stroke HCT: small single sulcal SAH  CTA- occlusion of a trifurcation branch of the left MCA.  No aneurysm  CTP - Ischemic area 37cc, ischemic core 65ml by CBF<30%, CBV - 64ml  MRI - Moderate to large acute left MCA territory cortical/subcortical infarct affecting the left frontal and parietal lobes, as well as left insula and subinsular region. 1.8 cm acute cortical/subcortical left occipital lobe infarct, a few additional punctate acute infarcts are questioned within the right frontoparietal white matter (versus artifact). Redemonstrated small volume subarachnoid hemorrhage overlying the anterior right frontal lobe.  CT head x 3 - unchanged  MRI repeat pending  2D Echo EF 60-65%.  No shunt. Septal Repair:25 mm Amplazter on 08/01/2016 noted.    TEE pending today   Concern for seizure: EEG: and LTM pending. Keppra initiated.   LDL   HgbA1c 10.5  Beta-2-glycoprotein pending d/t antiphospholipid syndrome  EEG showed moderate diffuse encephalopathy, nonspecific etiology. No seizures or epileptiform discharges were seen.   DVT PPX: SCDS  HOLD Antiplatelets due to thrombocytopenia (Plt 53, stable)  On ASA 81mg  PTA  NPO, Cortrak tube placement ordered   Therapy recommendations:  TBD  Disposition:  TBD  Dr. to bedside to discuss plan of care with Dr. Katrinka Blazing   Hx of stroke PFO closure  07/2016 patient had left MCA small infarct.  MRA head and neck negative.  DVT, TTE, pan CT negative.  TCD bubble study showed a small PFO.  TEE positive for PFO.  LDL 68 and A1c 5.6.  THC positive.  Hypercoagulable work-up showed beta-2 glycoprotein IgG 57.  Discharged with aspirin.  PFO closure done in 08/2016.  Repeat TTE showed good PFO closure procedure.  He was put on DAPT and Lipitor after PFO closure.  Repeat beta-2 glycoprotein IgG in 11/2016 was 90.  However, patient was lost follow-up with neurology.  Antiphospholipid syndrome was not diagnosed and was not treated.  MSSA bacteremia Sepsis Septic shock  Tachycardia to 130s persists, with tachypnea 20-30s   Tmax 101.3   Mild leukocytosis 10.8->9.8->7.3  Lactic acid 7.5->5.8->4.5->2.9  Antibiotics: Ancef   Serial Blood cultures pending (2/19 NG x 2d, 2/21 sent today)   TEE pending today  ID on board  Management per CCM  Encephalopathy  Lethargic, less interactive  Likely due to multiple critical medical conditions  CT stat unchanged  MRI repeat pending  EEG left temporal sharps - on EEG LTM  On keppra 1g bid per CCM  Respiratory failure  Tachypnea with respiratory distress  Intubated 2/20  CCM on board  HTN . HTN meds at home: norvasc 10mg  daily, hctz 12.5mg  daily, not  ordered. . Hypotension/soft BP intermittently in past 24 hours in the setting of sepsis . on MIVF at 150cc/hr.  . Keep BP at least greater than 100 systolic.  3/20 Long-term BP goal normotensive  Hyperlipidemia  Home meds: None. Per chart review he stopped statin 2018 d/t myalgia  TG 327   LDL direct 15.3, goal < 70  No statin indicated now given low LDL  Diabetes type II UnControlled  Home  meds:  Glucophage 500mg  daily   HgbA1c 10.5, goal < 7.0  Hyperosmolar hyperglycemic on admission  Was on insulin drip, now off  CBGs  SSI  Close PCP follow up after discharge  Thrombocytopenia  Plt 74->62->45->47->53  Likely reactive to current critical illness  Hepatitis panel non-reactive  ASA discontinued  HOLD antiplatelet meds   Still on heparin subq Q8 - OK to continue for now  Other Stroke Risk Factors   Former Cigarette smoker  Other Active Problems  AKI Ctn 1.55->1.23->1.62, Management per CCM  Metabolic Acidosis, Initial anion gap 16 with CO2 19, improving with anion gap 12 On sodium bicarb infusion Management per CCM  Hyponatremia, Na 124->126->135->133->145 , [facility]   H[facility] day # 3     ATTENDING NOTE: I reviewed above note and agree with the assessment and plan. Pt was seen and examined.   No family at the bedside. Pt was intubated yesterday for respiratory distress. Currently on precedex. Not responsive. Tmax 103 over night. Still on ancef. Repeat B Cx so far neg. On LTM EEG, no seizure on preliminary read. Now on keppra 1g bid. Still on levophed and bicarb. Pending TEE today.   On exam, pt intubated on precedex, eyes closed, not following commands. With forced eye opening, eyes in mid position, not blinking to visual threat, doll's eyes present, not tracking, b/l pupil sluggish to light. Corneal reflex weakly present on the left but absent on the right, gag and cough present. Breathing over the vent.  Facial symmetry not able to test due  to ET tube.  Tongue protrusion not cooperative. On pain stimulation, no movement of all extremities. Sensation, coordination and gait not tested.  Discussed with Dr. Denese KillingsAgarwala. Pt clinical presentation seems out of proportion to his stroke and sepsis. Agree with continue keppra. If no Sz on LTM EEG, it can be discontinued. Agree with MRI brain with contrast for further evaluation. Also recommend LP to rule out meningitis or encephalitis. If not stable for LP, may consider switch Abx to cover CNS infection. Will follow up with TEE finding.    Marvel PlanJindong Xu, MD PhD Stroke Neurology October 20, 2020 7:43 AM  This patient is critically ill due to sepsis, left MCA stroke, questionable endocarditis or meningitis, right frontal SAH, thrombocytopenia, AKI and at significant risk of neurological worsening, death form septic shock, cerebral edema, hemorrhagic conversion, heart failure, CNS infection, hemorrhage. This patient's care requires constant monitoring of vital signs, hemodynamics, respiratory and cardiac monitoring, review of multiple databases, neurological assessment, discussion with family, other specialists and medical decision making of high complexity. I spent 40 minutes of neurocritical care time in the care of this patient. I have discussed with Dr. Denese KillingsAgarwala.   To contact Stroke Continuity provider, please refer to [email or web address]. After hours, contact General Neurology

## 2020-04-23 NOTE — Progress Notes (Signed)
ID Pharmacy Note- Penicillin Allergy Clarification    I spoke with Mr. Clabo wife this afternoon about his penicillin allergy. She reports that the allergy was > 16 years ago before they met. He had told her that the allergy was itching with potentially some hives but he did not report anything else. Given that the allergy was so long ago and potentially limited to the skin, we will proceed with an amoxicillin oral challenge.    Plan Amoxicillin 250 mg per tube today at 1500 Vitals every 15 min for 1 hour post dose Communicated with nursing staff If he does well, we will plan to switch him to nafcillin If he has any reaction, we will switch him to vancomycin   Jimmy Footman, PharmD, BCPS, BCIDP Infectious Diseases Clinical Pharmacist Phone: [phone] 04/03/2020 3:01 PM

## 2020-04-23 NOTE — Progress Notes (Signed)
PCCM Progress Note  On evening rounds nursing and wife reported skin abnormalities to several fingers on each hand and feet. The lesion appear similar to [place] lesions seen with infectious endocarditis. No valvular vegitation seen on TTE. Patient is scheduled to undergo TEE tomorrow at bedside.         Delfin Gant, NP-C [facility] Personal contact information can be found on Amion  If no response please page: Adult pulmonary and critical care medicine pager on Amion unitl 7pm After 7pm please call [phone] 04/15/2020, 6:23 PM

## 2020-04-23 NOTE — Progress Notes (Signed)
Initial Nutrition Assessment  DOCUMENTATION CODES:   Not applicable  INTERVENTION:   Plan for Cortrak as able; recommend holding off on Cortrak until after TEE (scheduled for tomorrow) as NG tubes usually end up being removed during TEE, Recommend utilizing OG/NG in interim.  Tube feeding via OG: Vital 1.5 at 60 ml/hr Pro-Source TF 45 mL TID Provides 130 g of protein, 2280 kcals and 1094 mL of free water Meets 100% estimated calorie and protein needs   NUTRITION DIAGNOSIS:   Inadequate oral intake related to acute illness as evidenced by NPO status.  GOAL:   Patient will meet greater than or equal to 90% of their needs  MONITOR:   Vent status,TF tolerance,Labs,Weight trends  REASON FOR ASSESSMENT:   Consult,Ventilator Enteral/tube feeding initiation and management  ASSESSMENT:   55 yo male admitted with R sided weakness and found to have L MCA territory ischemic stroke with subsequent frontal bleed, MSSA bacteremia. PMH includes HTN, CVA, DM, GERD   2/18 Admitted 2/20 Intubated  Patient is currently intubated on ventilator support, currently on levophed, sodium bicarb gtt MV: 16.2 L/min Temp (24hrs), Avg:100.9 F (38.3 C), Min:99.3 F (37.4 C), Max:103 F (39.4 C)  Propofol: NONE  Noted MRI brain and LP pending, plan for TEE tomorrow to r/o endocarditis  Adult TF protocol initiated yesterday; Vital High Protein infusing at 40 ml/hr   Enteric tube noted on chest xray but not noted in LDA. Spoke with RN who indicates pt has OG tube in place  Unable to obtain diet and weight history from patient at this time  Current wt 87.6 kg; Net +9 L per I/o flow sheet. Admit wt 77.8 kg. Mild edema on exam   Labs: CBGs 219-251 (ICU goal 140-180), phosphorus 2.2 (L) Meds: ss novolog, novolog q 4 hours, MVI, thiamine, folic acid, potassium phosphate, miralax, colace  Diet Order:   Diet Order            Diet NPO time specified  Diet effective midnight                  EDUCATION NEEDS:   Not appropriate for education at this time  Skin:  Skin Assessment: Skin Integrity Issues: Skin Integrity Issues:: Other (Comment) Other: skin tear-anus  Last BM:  PTA  Height:   Ht Readings from Last 1 Encounters:  04/22/20 5\' 6"  (1.676 m)    Weight:   Wt Readings from Last 1 Encounters:  04/22/20 87.6 kg   BMI:  Body mass index is 31.17 kg/m.  Estimated Nutritional Needs:   Kcal:  2100-2500 kcals  Protein:  125-150 g  Fluid:  >/= 2 L   04/24/20 MS, RDN, LDN, CNSC Registered Dietitian III Clinical Nutrition RD Pager and On-Call Pager Number Located in [place]

## 2020-04-23 NOTE — Progress Notes (Signed)
PT Cancellation Note  Patient Details Name: Zachary Woods MRN: [id number] DOB: 1965/04/03   Cancelled Treatment:    Reason Eval/Treat Not Completed: Patient at procedure or test/unavailable;Patient not medically ready today as he is sedated with multiple procedures planned at this time. PT will continue to follow and progress PT POC as able.   [name], DPT   Acute Rehabilitation Department Pager #: [phone]   [name] 01-May-2020, 10:55 AM

## 2020-04-23 NOTE — Procedures (Signed)
Patient Name: Zachary Woods  MRN: [id number]  Epilepsy Attending: Charlsie Quest  Referring Physician/Provider: Dr Lynnell Catalan Duration: 04/22/2020 1343 to 04/07/2020 1343  Patient history: 55 y.o.malewith history of previous cryptogenic Left MCA stroke s/p PFO closure, lost to stroke follow up 2018, DM2, HTN, GERDpresenting withaphasia, right facial droop and right sided weakness noted upon waking up at 0300 this morning.EEG to evaluate for seizure  Level of alertness: lethargic, asleep  AEDs during EEG study: LEV  Technical aspects: This EEG study was done with scalp electrodes positioned according to the 10-20 International system of electrode placement. Electrical activity was acquired at a sampling rate of 500Hz  and reviewed with a high frequency filter of 70Hz  and a low frequency filter of 1Hz . EEG data were recorded continuously and digitally stored.   Description: No posterior dominant rhythm was seen.  Sleep was characterized by sleep spindles (12 to 14 Hz), maximal frontocentral region. EEG showed continuous generalized 3 to 6 Hz theta-delta slowing which had triphasic morphology at times when patient was awake/stimulated. Spikes were seen in left temporal region.    After around 830 on 2/21/122, patient was noted to have seizures without clinical signs arising from left temporoparietal region, last seizure at 1341, avg duration 1.72minutes. Hyperventilation and photic stimulation were not performed.     ABNORMALITY -Seizures without clinical signs, left temporo-parietal region -Spike, left temporal region -Continuous slow, generalized  IMPRESSION: This study showed multiple seizures without clinical signs arising from left temporoparietal region, last seizure at 1341, avg duration 1.23minutes. Additionally there is moderate diffuse encephalopathy, nonspecific etiology.   [name] 05-15-1976

## 2020-04-23 NOTE — Progress Notes (Signed)
EEG maintenance complete. No skin breakdown at FP1 FP2 F7 F8. Continue to monitor

## 2020-04-23 NOTE — Progress Notes (Signed)
Patient intubated after initial TEE consultation on Friday in the emergency room.  TEE rescheduled for tomorrow at noon at bedside by Dr. Rennis Golden.  Stop feeding tube at midnight.  Manson Passey, PA- C

## 2020-04-23 NOTE — Procedures (Signed)
Lumbar Puncture Procedure Note  JHAIR WITHERINGTON  [id number]  [birth year]  Date:2020-05-10  Time:4:29 PM   Provider Performing:Hadasa Gasner   Procedure: Lumbar Puncture (98921)  Indication(s) Rule out meningitis  Consent Risks of the procedure as well as the alternatives and risks of each were explained to the patient and/or caregiver.  Consent for the procedure was obtained and is signed in the bedside chart  Anesthesia Topical only with 1% lidocaine    Time Out Verified patient identification, verified procedure, site/side was marked, verified correct patient position, special equipment/implants available, medications/allergies/relevant history reviewed, required imaging and test results available.   Sterile Technique Maximal sterile technique including sterile barrier drape, hand hygiene, sterile gown, sterile gloves, mask, hair covering.    Procedure Description Using palpation, approximate location of L3-L4 space identified.   Lidocaine used to anesthetize skin and subcutaneous tissue overlying this area.  A 20g spinal needle was then used to access the subarachnoid space. Opening pressure:24cm H2O. Closing pressure:Not obtained. 12 mL xanthochromic CSF obtained.  Complications/Tolerance None; patient tolerated the procedure well.   EBL None   Specimen(s) CSF  Lynnell Catalan, MD [facility] ICU Physician [facility] [place] [facility]  Pager: [phone] Or Epic Secure Chat After hours: [phone].  2020-05-10, 4:30 PM

## 2020-04-24 ENCOUNTER — Inpatient Hospital Stay (HOSPITAL_COMMUNITY): Payer: 59

## 2020-04-24 DIAGNOSIS — R569 Unspecified convulsions: Secondary | ICD-10-CM | POA: Diagnosis not present

## 2020-04-24 DIAGNOSIS — Q211 Atrial septal defect: Secondary | ICD-10-CM | POA: Diagnosis not present

## 2020-04-24 DIAGNOSIS — Z794 Long term (current) use of insulin: Secondary | ICD-10-CM

## 2020-04-24 DIAGNOSIS — R7881 Bacteremia: Secondary | ICD-10-CM | POA: Diagnosis not present

## 2020-04-24 DIAGNOSIS — B9561 Methicillin susceptible Staphylococcus aureus infection as the cause of diseases classified elsewhere: Secondary | ICD-10-CM | POA: Diagnosis not present

## 2020-04-24 DIAGNOSIS — E1165 Type 2 diabetes mellitus with hyperglycemia: Secondary | ICD-10-CM | POA: Diagnosis not present

## 2020-04-24 DIAGNOSIS — R0989 Other specified symptoms and signs involving the circulatory and respiratory systems: Secondary | ICD-10-CM

## 2020-04-24 DIAGNOSIS — N179 Acute kidney failure, unspecified: Secondary | ICD-10-CM | POA: Diagnosis not present

## 2020-04-24 DIAGNOSIS — I639 Cerebral infarction, unspecified: Secondary | ICD-10-CM | POA: Diagnosis not present

## 2020-04-24 DIAGNOSIS — I63412 Cerebral infarction due to embolism of left middle cerebral artery: Secondary | ICD-10-CM | POA: Diagnosis not present

## 2020-04-24 DIAGNOSIS — I6340 Cerebral infarction due to embolism of unspecified cerebral artery: Secondary | ICD-10-CM | POA: Diagnosis not present

## 2020-04-24 LAB — BASIC METABOLIC PANEL
Anion gap: 15 (ref 5–15)
BUN: 85 mg/dL — ABNORMAL HIGH (ref 6–20)
CO2: 22 mmol/L (ref 22–32)
Calcium: 7.5 mg/dL — ABNORMAL LOW (ref 8.9–10.3)
Chloride: 111 mmol/L (ref 98–111)
Creatinine, Ser: 1.92 mg/dL — ABNORMAL HIGH (ref 0.61–1.24)
GFR, Estimated: 41 mL/min — ABNORMAL LOW (ref >60)
Glucose, Bld: 274 mg/dL — ABNORMAL HIGH (ref 70–99)
Potassium: 3.4 mmol/L — ABNORMAL LOW (ref 3.5–5.1)
Sodium: 148 mmol/L — ABNORMAL HIGH (ref 135–145)

## 2020-04-24 LAB — PHOSPHORUS: Phosphorus: 5.5 mg/dL — ABNORMAL HIGH (ref 2.5–4.6)

## 2020-04-24 LAB — CBC
HCT: 28.2 % — ABNORMAL LOW (ref 39.0–52.0)
Hemoglobin: 10.1 g/dL — ABNORMAL LOW (ref 13.0–17.0)
MCH: 34.9 pg — ABNORMAL HIGH (ref 26.0–34.0)
MCHC: 35.8 g/dL (ref 30.0–36.0)
MCV: 97.6 fL (ref 80.0–100.0)
Platelets: 63 10*3/uL — ABNORMAL LOW (ref 150–400)
RBC: 2.89 MIL/uL — ABNORMAL LOW (ref 4.22–5.81)
RDW: 14.5 % (ref 11.5–15.5)
WBC: 7.6 10*3/uL (ref 4.0–10.5)
nRBC: 0 % (ref 0.0–0.2)

## 2020-04-24 LAB — MAGNESIUM: Magnesium: 2.8 mg/dL — ABNORMAL HIGH (ref 1.7–2.4)

## 2020-04-24 LAB — HSV 1 ANTIBODY, IGG: HSV 1 Glycoprotein G Ab, IgG: <0.91 index (ref 0.00–0.90)

## 2020-04-24 LAB — GLUCOSE, CAPILLARY
Glucose-Capillary: 259 mg/dL — ABNORMAL HIGH (ref 70–99)
Glucose-Capillary: 217 mg/dL — ABNORMAL HIGH (ref 70–99)
Glucose-Capillary: 200 mg/dL — ABNORMAL HIGH (ref 70–99)
Glucose-Capillary: 170 mg/dL — ABNORMAL HIGH (ref 70–99)
Glucose-Capillary: 211 mg/dL — ABNORMAL HIGH (ref 70–99)
Glucose-Capillary: 254 mg/dL — ABNORMAL HIGH (ref 70–99)

## 2020-04-24 LAB — TRIGLYCERIDES: Triglycerides: 187 mg/dL — ABNORMAL HIGH (ref <150)

## 2020-04-24 MED ORDER — ADULT MULTIVITAMIN W/MINERALS CH
1.0000 | ORAL_TABLET | Freq: Every day | ORAL | Status: DC
  Administered 2020-04-25 – 2020-04-30 (×6): 1
  Filled 2020-04-24 (×6): qty 1

## 2020-04-24 MED ORDER — SODIUM CHLORIDE 0.9 % IV SOLN
12.0000 g | INTRAVENOUS | Status: DC
  Administered 2020-04-24 – 2020-04-29 (×7): 12 g via INTRAVENOUS
  Filled 2020-04-24 (×7): qty 12000

## 2020-04-24 MED ORDER — SODIUM CHLORIDE 0.9 % IV SOLN
INTRAVENOUS | Status: DC | PRN
  Administered 2020-04-24 (×2): 250 mL via INTRAVENOUS
  Administered 2020-04-27: 500 mL via INTRAVENOUS

## 2020-04-24 MED ORDER — PANTOPRAZOLE SODIUM 40 MG PO TBEC: 40.0000 mg | DELAYED_RELEASE_TABLET | Freq: Every day | ORAL | Status: DC

## 2020-04-24 MED ORDER — FOLIC ACID 1 MG PO TABS
1.0000 mg | ORAL_TABLET | Freq: Every day | ORAL | Status: DC
  Administered 2020-04-25 – 2020-04-30 (×6): 1 mg
  Filled 2020-04-24 (×6): qty 1

## 2020-04-24 MED ORDER — INSULIN GLARGINE 100 UNIT/ML ~~LOC~~ SOLN
15.0000 [IU] | Freq: Two times a day (BID) | SUBCUTANEOUS | Status: DC
  Administered 2020-04-24 (×2): 15 [IU] via SUBCUTANEOUS
  Filled 2020-04-24 (×4): qty 0.15

## 2020-04-24 MED ORDER — POTASSIUM CHLORIDE 20 MEQ PO PACK
20.0000 meq | PACK | Freq: Once | ORAL | Status: AC
  Administered 2020-04-24: 20 meq
  Filled 2020-04-24: qty 1

## 2020-04-24 MED ORDER — THIAMINE HCL 100 MG PO TABS
100.0000 mg | ORAL_TABLET | Freq: Every day | ORAL | Status: DC
  Administered 2020-04-25 – 2020-04-30 (×6): 100 mg
  Filled 2020-04-24 (×6): qty 1

## 2020-04-24 MED ORDER — GENTAMICIN IN SALINE 1.6-0.9 MG/ML-% IV SOLN
80.0000 mg | Freq: Two times a day (BID) | INTRAVENOUS | Status: DC
  Administered 2020-04-24 – 2020-04-25 (×2): 80 mg via INTRAVENOUS
  Filled 2020-04-24 (×3): qty 50

## 2020-04-24 MED ORDER — SODIUM CHLORIDE 0.9 % IV SOLN
300.0000 mg | Freq: Three times a day (TID) | INTRAVENOUS | Status: DC
  Administered 2020-04-24 – 2020-04-30 (×17): 300 mg via INTRAVENOUS
  Filled 2020-04-24 (×26): qty 300

## 2020-04-24 MED ORDER — PANTOPRAZOLE SODIUM 40 MG PO PACK
40.0000 mg | PACK | Freq: Every day | ORAL | Status: DC
  Administered 2020-04-25 – 2020-04-30 (×6): 40 mg
  Filled 2020-04-24 (×6): qty 20

## 2020-04-24 NOTE — Progress Notes (Signed)
Pharmacy Antibiotic Note  Zachary Woods is a 55 y.o. 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 Pharmacy has been consulted for gentamicin dosing.   Plan: - Continue nafcillin  - Initiate 80 mg (2-3 mg/kg) Q12H to assess renal function>> increase to 1 mg/kg Q8H as tolerated  - Initiate rifampin 300 mg Q8H  - Follow-up renal function, gentamicin levels, repeat blood cultures    Height: 5\' 6"  (167.6 cm) Weight: 87.6 kg (193 lb 2 oz) IBW/kg (Calculated) : 63.8  Temp (24hrs), Avg:100.3 F (37.9 C), Min:98.9 F (37.2 C), Max:102.4 F (39.1 C)  Recent Labs  Lab 04/03/2020 1217 04/22/2020 1714 04/23/2020 2112 04/21/20 0423 04/21/20 1815 04/22/20 0345 04/22/20 1112 04/22/2020 0405 04/24/20 0556  WBC  --  12.0*  --  10.8*  --  9.8  --  7.3 7.6  CREATININE 1.53* 1.23  --  1.24  --  1.19  --  1.62* 1.92*  LATICACIDVEN 7.4* 5.8* 4.5*  --  4.1*  --  2.9*  --   --     Estimated Creatinine Clearance: 45.6 mL/min (A) (by C-G formula based on SCr of 1.92 mg/dL (H)).    No Active Allergies  Antimicrobials this admission: Cefepime 2/18 >> 2/19 Vancomycin 2/18>> 2/19 Cefazolin 2/19>> 2/21 Nafcillin 2/21>> current    Microbiology results: CSF culture 2/21- NGTD  Bcx 2/19- NGTD  Bcx 2/18- MSSA Urine cx 2/19- MSSA  MRSA PCR: negative   Thank you for allowing pharmacy to be a part of this patient's care.  3/19, PharmD-Candidate  04/24/2020 2:40 PM  Infectious Diseases Consult Service

## 2020-04-24 NOTE — Procedures (Signed)
Patient Name:Zachary Woods YIR:[id number] Epilepsy Attending:Malakhai Beitler Annabelle Harman Referring Physician/Provider:Dr Lynnell Catalan Duration:04/13/2020 1343 to 04/24/2020 1343  Patient history:55 y.o.malewith history of previous cryptogenic Left MCA stroke s/p PFO closure, lost to stroke follow up 2018, DM2, HTN, GERDpresenting withaphasia, right facial droop and right sided weakness noted upon waking up at 0300 this morning.EEG to evaluate for seizure  Level of alertness:lethargic, asleep  AEDs during EEG study: LEV  Technical aspects: This EEG study was done with scalp electrodes positioned according to the 10-20 International system of electrode placement. Electrical activity was acquired at a sampling rate of 500Hz  and reviewed with a high frequency filter of 70Hz  and a low frequency filter of 1Hz . EEG data were recorded continuously and digitally stored.   Description:Noposterior dominant rhythm was seen.  Sleep was characterized by sleep spindles (12 to 14 Hz), maximal frontocentral region.EEG showed continuous generalized 3 to 6 Hz theta-delta slowing which had triphasic morphology at times when patient was awake/stimulated.Spikes were seen in left temporal region.Two seizures without clinical signs were noted arising from left temporoparietal region, on 04/09/2020 at 1347 and at 1402, avg duration 1.63minutes.   ABNORMALITY -Seizures without clinical signs, left temporo-parietal region -Spike, left temporal region -Continuousslow, generalized  IMPRESSION: This studyshowed two seizures without clinical signs arising from left temporoparietal region, on 04/07/2020 at 1347 and 1402, avg duration 1.4minutes. Additionally there is moderate diffuse encephalopathy, nonspecific etiology.  EEG appears to be improving compared to previous day.  [name] 4m

## 2020-04-24 NOTE — Progress Notes (Signed)
  Echocardiogram Echocardiogram Transesophageal has been performed.  Zachary Woods 04/24/2020, 1:58 PM

## 2020-04-24 NOTE — Progress Notes (Addendum)
NAME:  Zachary Woods, MRN:  [id number], DOB:  09-03-1965, LOS: 4 ADMISSION DATE:  04/29/2020, CHIEF COMPLAINT: AMS in the setting of acute stroke   Brief History:  55 year old male who was admitted on 2/18 with left MCA infarction and small frontal SAH. Required intubation for progressive AMS.  History of Present Illness:  Zachary Woods is a 55 year old male with pertinent PMH of HTN, CVA, PFO s/p closure in 2018, DM 2, and GERD who presented to the ED with right sided weakness. He was last in his normal state of health the day prior. Overnight, the patient got up to go to the bathroom and could not move his right side and fell to the ground.   In ED, CT head showed small volume SAH. CTA/CTP demonstrated moderate sized left MCA territory ischemic area.   Patient's AMS continued to progress and needed to be intubated. PCCM was subsequently consulted and patient was transferred/ admitted to Neuro CCU. Per note, neurology is worried about septic emboli and recommend a TEE looking for endocarditis and vegetative valvular lesions. Empiric Vanc/ Cefepime.   Past Medical History:  has Hypertension; GERD (gastroesophageal reflux disease); Malignant hypertension; Cerebral embolism with cerebral infarction; Stroke determined by clinical assessment (HCC); PFO (patent foramen ovale); Acute CVA (cerebrovascular accident) (HCC); Subarachnoid hemorrhage (HCC); Type 2 diabetes mellitus with hyperosmolar hyperglycemic state (HHS) (HCC); AKI (acute kidney injury) (HCC); SIRS (systemic inflammatory response syndrome) (HCC); Metabolic acidosis with increased anion gap and accumulation of organic acids; Hypomagnesemia; Hypokalemia; Thrombocytopenia (HCC); and Stroke (HCC) on their problem list.   Significant Hospital Events:  2/18 > admit, intubated  Consults:  Cardiology and Neurology   Procedures:    Significant Diagnostic Tests:  CT / CTA / CTP head and neck 2/18 > small volume right frontal SAH, left MCA  occlusion, ischemic area of 37cc with core infarct 21cc, 60% prox left ICA stenosis. MRI brain 2/18 > mod to large acute left MCA infarct, 1.8cm acute cortical / subcortical left occipital lobe infarct, small volume right frontal lobe SAH. Echo 2/18 > EF 60 - 65%, no source of embolism detected. LE duplex 2/18 > neg. Carotid US 2/18 > 1 - 39% stenosis left ICA. EEG 2/18 > mod to diffuse encephalopathy without seizures or epileptiform discharges. Chest x-ray 2/19-poor inspiratory effort but otherwise normal TEE 2/22 >  Micro Data:  COVID 2/18 > neg. Flu 2/18 > neg. Blood cultures 2/18- MSSA positive. 2/19 negative.   Antimicrobials:  Naficillin Interim History / Subjective:  Patient remains sedated and does not respond to voice or painful stimuli. Having some active seizures, propofol titrated to extinguish seizures.  Objective   Blood pressure (!) 122/105, pulse (!) 107, temperature 98.9 F (37.2 C), temperature source Axillary, resp. rate (!) 26, height 5\' 6"  (1.676 m), weight 87.6 kg, SpO2 99 %.    Vent Mode: PRVC FiO2 (%):  [30 %-40 %] 30 % Set Rate:  [18 bmp] 18 bmp Vt Set:  [510 mL] 510 mL PEEP:  [5 cmH20-8 cmH20] 5 cmH20 Plateau Pressure:  [15 cmH20-22 cmH20] 19 cmH20   Intake/Output Summary (Last 24 hours) at 04/24/2020 1044 Last data filed at 04/24/2020 1000 Gross per 24 hour  Intake 3427.87 ml  Output 800 ml  Net 2627.87 ml   Filed Weights   04/25/2020 0400 04/21/20 0342 04/22/20 0400  Weight: 77.8 kg 82.4 kg 87.6 kg    Examination: General: Sedated. Unresponsive to voice.  HENT: Atraumatic. Eye constricted and equal.  Non-reactive to light.  Neck: slightly rigid on examination Lungs: Clear to auscultation. Cardiovascular: Tachycardic, but normal rhythm. No M/R/G  Extremities: warm/dry, no edema.  Neuro: No response to voice or painful stimuli. CNII/III: Pupils constricted without reactivity to light. Doll's eyes negative. CN V/VII: Corneal reflex normal on left  eye, but not on right. CN X: cough reflex intact Skin: Positive for janeway lesions on UE bilaterally and left LE  Spider nevi throughout chest, back, and lower extremities GU: Catheter in place   Resolved Hospital Problem list     Assessment & Plan:  Critically ill due to Left MCA stroke with left MCA branch occlusion with small right frontal subarachnoid. Multifactorial encephalopathy -Continue Keppra 1000mg  IV Q12h  - Continue Valproic acid 500mg  TID  - Titrate propofol as needed for breakthrough seizures  - Continuous EEG monitoring   MSSA bacteremia, Bacterial Meningitis- possible skin source, sepsis: - F/U TEE results today at bedside - Continue Naficillin as per ID recs for better CNS coverage - F/u on MRI   Uncontrolled type 2 diabetes  - Hold TF - Continue Novolog for TF coverage after TEE - Add 15 units Lantus BID   Hypotensinon: Stable  -  Titrate Levophed as needed. Goal SBP > 100 and MAP > 65  Hypokalemia: - Replenish with KCl   Constipation- No BM since 2/19:  - Continue bowel regimen of Colace and Miralax - Add Senna   Best practice (evaluated daily)  Pain/Anxiety/Delirium protocol (if indicated): None Neuro vitals: every 2 hours AED's: Keppra and Valproic Acid  VAP protocol (if indicated): Intubated, maintain VAP protocols Respiratory support goals: Titrate oxygen to keep sats greater than 88% Blood pressure target: Keep systolic above 100 DVT prophylaxis: Heparin 3 times daily Nutrition Status:  HOLD for TEE GI prophylaxis: Not indicated Fluid status goals: Allow autoregulation at this time Urinary catheter: Use external catheter Central lines: No central line Glucose control: Type 2 diabetes acceptable control Mobility/therapy needs: Bedrest Antibiotic de-escalation: Naficillin Home medication reconciliation: On hold Daily labs: CBC BMP Code Status: Full code Family Communication: Wife updated at bedside 2/22 Disposition: ICU  Goals of Care:   Last date of multidisciplinary goals of care discussion:Pending  Family and staff present: Pending  Summary of discussion: Pending  Follow up goals of care discussion due: Pending  Code Status: Full  Labs   CBC: Recent Labs  Lab 04/15/2020 0416 04/15/2020 0424 04/03/2020 1714 04/21/20 0423 04/22/20 0345 04/22/20 1706 04/21/2020 0405 04/24/20 0556  WBC 6.7  --  12.0* 10.8* 9.8  --  7.3 7.6  NEUTROABS 6.1  --  11.0*  --   --   --   --   --   HGB 15.4   < > 13.5 13.4 12.6* 11.6* 11.5* 10.1*  HCT 42.0   < > 37.9* 37.2* 35.1* 34.0* 33.6* 28.2*  MCV 93.5  --  94.0 94.2 94.4  --  97.4 97.6  PLT 74*  --  62* 45* 47*  --  53* 63*   < > = values in this interval not displayed.    Basic Metabolic Panel: Recent Labs  Lab 04/29/2020 0416 04/10/2020 1714 04/21/20 0423 04/21/20 2141 04/22/20 0345 04/22/20 1604 04/22/20 1706 04/15/2020 0405 04/05/2020 1630 04/24/20 0556  NA  --  133* 136  --  140  --  145 145  --  148*  K  --  2.8* 3.4*  --  3.8  --  4.0 3.8  --  3.4*  CL  --  103 108  --  111  --   --  116*  --  111  CO2  --  18* 15*  --  15*  --   --  17*  --  22  GLUCOSE  --  253* 153*  --  173*  --   --  289*  --  274*  BUN  --  20 23*  --  35*  --   --  66*  --  85*  CREATININE  --  1.23 1.24  --  1.19  --   --  1.62*  --  1.92*  CALCIUM  --  7.6* 7.7*  --  7.9*  --   --  7.8*  --  7.5*  MG  --   --  2.1  --   --  2.6*  --  2.6* 2.8* 2.8*  PHOS   < >  --  1.6* 2.6  --  3.4  --  2.2* 3.1 5.5*   < > = values in this interval not displayed.   GFR: Estimated Creatinine Clearance: 45.6 mL/min (A) (by C-G formula based on SCr of 1.92 mg/dL (H)). Recent Labs  Lab 04/09/2020 1714 04/22/2020 2112 04/21/20 0423 04/21/20 1815 04/22/20 0345 04/22/20 1112 04/17/2020 0405 04/24/20 0556  PROCALCITON 34.38  --   --   --   --   --   --   --   WBC 12.0*  --  10.8*  --  9.8  --  7.3 7.6  LATICACIDVEN 5.8* 4.5*  --  4.1*  --  2.9*  --   --     Liver Function Tests: Recent Labs  Lab 04/12/2020 0416   AST 40  ALT 23  ALKPHOS 127*  BILITOT 1.7*  PROT 6.2*  ALBUMIN 2.8*   No results for input(s): LIPASE, AMYLASE in the last 168 hours. No results for input(s): AMMONIA in the last 168 hours.  ABG    Component Value Date/Time   PHART 7.265 (L) 04/22/2020 1706   PCO2ART 42.3 04/22/2020 1706   PO2ART 292 (H) 04/22/2020 1706   HCO3 19.1 (L) 04/22/2020 1706   TCO2 20 (L) 04/22/2020 1706   ACIDBASEDEF 7.0 (H) 04/22/2020 1706   O2SAT 100.0 04/22/2020 1706     Coagulation Profile: Recent Labs  Lab 04/08/2020 0416  INR 1.3*    Cardiac Enzymes: No results for input(s): CKTOTAL, CKMB, CKMBINDEX, TROPONINI in the last 168 hours.  HbA1C: Hgb A1c MFr Bld  Date/Time Value Ref Range Status  04/12/2020 12:17 PM 10.5 (H) 4.8 - 5.6 % Final    Comment:    (NOTE) Pre diabetes:          5.7%-6.4%  Diabetes:              >6.4%  Glycemic control for   <7.0% adults with diabetes   08/04/2019 05:25 PM 8.2 (H) 4.8 - 5.6 % Final    Comment:             Prediabetes: 5.7 - 6.4          Diabetes: >6.4          Glycemic control for adults with diabetes: <7.0     CBG: Recent Labs  Lab 30-Apr-2020 1511 04/12/2020 1925 04/08/2020 2313 04/24/20 0319 04/24/20 0728  GLUCAP 228* 313* 358* 259* 217*    Past Medical History:  He,  has a past medical history of Acid reflux, CVA (cerebral vascular accident) (HCC), Diabetes mellitus, type 2 (  HCC), and Hypertension.   Surgical History:   Past Surgical History:  Procedure Laterality Date  . PATENT FORAMEN OVALE(PFO) CLOSURE N/A 08/01/2016   Procedure: Patent [place](PFO) Closure;  Surgeon: Tonny Bollman, MD;  Location: [facility] INVASIVE CV LAB;  Service: Cardiovascular;  Laterality: N/A;  . TEE WITHOUT CARDIOVERSION N/A 07/21/2016   Procedure: TRANSESOPHAGEAL ECHOCARDIOGRAM (TEE);  Surgeon: Lars Masson, MD;  Location: [facility] ENDOSCOPY;  Service: Cardiovascular;  Laterality: N/A;     Social History:   reports that he has quit smoking. He  has never used smokeless tobacco. He reports current alcohol use of about 1.0 standard drink of alcohol per week. He reports that he does not use drugs.   Family History:  His family history includes Diabetes in his brother and father.   Allergies Allergies  Allergen Reactions  . Penicillins Itching    Has patient had a PCN reaction causing immediate rash, facial/tongue/throat swelling, SOB or lightheadedness with hypotension: No Has patient had a PCN reaction causing severe rash involving mucus membranes or skin necrosis: No Has patient had a PCN reaction that required hospitalization: No Has patient had a PCN reaction occurring within the last 10 years: Yes If all of the above answers are "NO", then may proceed with Cephalosporin use.     Home Medications  Prior to Admission medications   Medication Sig Start Date End Date Taking? Authorizing Provider  amLODipine (NORVASC) 10 MG tablet Take 1 tablet (10 mg total) by mouth daily. 02/04/18  Yes Shade Flood, MD  aspirin EC 81 MG tablet Take 1 tablet (81 mg total) by mouth daily. 08/05/17  Yes Janetta Hora, PA-C  ibuprofen (ADVIL) 200 MG tablet Take 400 mg by mouth every 6 (six) hours as needed for headache or mild pain.   Yes [provider]  meloxicam (MOBIC) 7.5 MG tablet Take 7.5 mg by mouth daily. 06/17/19  Yes [provider]  metFORMIN (GLUCOPHAGE) 500 MG tablet Take 1 tablet (500 mg total) by mouth daily with breakfast. 08/04/19  Yes Shade Flood, MD  Multiple Vitamin (MULTIVITAMIN WITH MINERALS) TABS tablet Take 1 tablet by mouth daily.   Yes [provider]  omeprazole (PRILOSEC OTC) 20 MG tablet Take 20 mg by mouth daily.   Yes [provider]  pantoprazole (PROTONIX) 40 MG tablet TAKE 1 TABLET BY MOUTH EVERY DAY Patient taking differently: Take 40 mg by mouth daily. 03/25/17  Yes Tonny Bollman, MD  sildenafil (VIAGRA) 100 MG tablet Take 0.5-1 tablets (50-100 mg total) by mouth  daily as needed for erectile dysfunction. 11/27/16  Yes Shade Flood, MD  Tetrahydrozoline HCl (VISINE OP) Place 1 drop into both eyes daily as needed (dry eyes).   Yes [provider]  hydrochlorothiazide (MICROZIDE) 12.5 MG capsule TAKE 1 CAPSULE BY MOUTH EVERY DAY Patient not taking: No sig reported 06/23/18   Shade Flood, MD     Critical care time:       Andreas Newport, MS4

## 2020-04-24 NOTE — CV Procedure (Signed)
TRANSESOPHAGEAL ECHOCARDIOGRAM (TEE) NOTE  INDICATIONS: infective endocarditis  PROCEDURE:   Informed consent was obtained prior to the procedure. The risks, benefits and alternatives for the procedure were discussed and the patient comprehended these risks.  Risks include, but are not limited to, cough, sore throat, vomiting, nausea, somnolence, esophageal and stomach trauma or perforation, bleeding, low blood pressure, aspiration, pneumonia, infection, trauma to the teeth and death.    After a procedural time-out, the patient was already sedated on a propofol gtts in the neuro ICU- monitored by the CCRN.  The patient's heart rate, blood pressure, and oxygen saturation are monitored continuously during the procedure. An additional 25 mcg fentanyl was given for sedation. The patient was intubated on the ventilator.  The transesophageal probe was inserted in the esophagus and stomach without difficulty and multiple views were obtained.  I was present face-to-face 100% of this time.   Agitated microbubble saline contrast was administered.  COMPLICATIONS:    There were no immediate complications.  Findings:  1. LEFT VENTRICLE: The left ventricular wall thickness is mildly increased.  The left ventricular cavity is normal in size. Wall motion is normal.  LVEF is 60-65%.  2. RIGHT VENTRICLE:  The right ventricle is normal in structure and function without any thrombus or masses.    3. LEFT ATRIUM:  The left atrium is normal in size without any thrombus or masses.  There is not spontaneous echo contrast ("smoke") in the left atrium consistent with a low flow state.  4. LEFT ATRIAL APPENDAGE:  The left atrial appendage is free of any thrombus or masses. The appendage is small and has a single lobe. Pulse doppler indicates moderate flow in the appendage.  5. ATRIAL SEPTUM:  The atrial septum was previously repaired with a 25 mm Amplatzer device. This device is noted and appears stable,  however, there appears to be an inferior ASD with left to right flow - possibly from endocarditis associated tissue erosion. Microbubble contrast was unable to opacify the right atrium after many attempts, likely due to left to right shunting. There is a large fixed elliptical mass attached to the atrial septum and by a stalk to the posterior mitral leaflet, this is likely a vegetation.  6. RIGHT ATRIUM:  The right atrium is normal in size and function without any thrombus or masses.  7. MITRAL VALVE:  The mitral valve is normal in structure and function with trivial regurgitation.  There were no vegetations or stenosis.  8. AORTIC VALVE:  The aortic valve is trileaflet, normal in structure and function with no regurgitation.  There were no vegetations or stenosis  9. TRICUSPID VALVE:  The tricuspid valve is normal in structure and function with trivial regurgitation.  There were no vegetations or stenosis  10.  PULMONIC VALVE:  The pulmonic valve is normal in structure and function with no regurgitation.  There were no vegetations or stenosis.   11. AORTIC ARCH, ASCENDING AND DESCENDING AORTA:  There was no Myrtis Ser et. Al, 1992) atherosclerosis of the ascending aorta, aortic arch, or proximal descending aorta.  12. PULMONARY VEINS: Anomalous pulmonary venous return was not noted.  13. PERICARDIUM: The pericardium appeared normal and non-thickened.  There is no pericardial effusion.  IMPRESSION:   1. 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  2. There is an inferior ring ASD with left to right flow around the Amplatzer device, suggesting possibly tissue erosion,  however, the device appeared stable (not rocking). 3. LVEF 60-65%, normal wall motion  RECOMMENDATIONS:    1. CT surgical evaluation for Amplatzer removal, debridement and patch closure of ASD is ideal, however, given the patient's  co-morbidities, this may not be an option at present. Findings d/w Dr. Denese Killings (PCCM attending).  Time Spent Directly with the Patient:  60 minutes   Chrystie Nose, MD, [facility], FACP  [place]  [facility]  Medical Director of the [facility]  [facility] Diplomate of the American Board of Clinical Lipidology Attending Cardiologist  Direct Dial: [phone]  Fax: [phone]  Website:  [email or web address] Nicely Marcie Shearon 04/24/2020, 1:27 PM

## 2020-04-24 NOTE — Progress Notes (Signed)
EEG maintenance complete. No skin breakdown at FP1 FP2 Fz F7. Continue to monitor

## 2020-04-24 NOTE — Progress Notes (Addendum)
[facility]   I have seen and examined the patient. I have personally reviewed the clinical findings, laboratory findings, microbiological data and imaging studies. The assessment and treatment plan was discussed with the  Advance Practice Provider, Janene Madeira. I agree with her/his recommendations except following additions/corrections.  Remains intubated  Repeat blood cx 2/19 NG in 3 days and 2/21 NG in <12 hrs LP yesterday concerning for bacterial meningitis. CSF cx with NG and no organisms <24 hrs Exam consistent with multiple non -blanchable lesions in the bilateral hand and feet   TEE showed  Large 1x2 cm vegetation associated with the Amplatzer atrial septal occlusion device, which appears to extend to the posterior mitral leaflet.  There is an inferior ring ASD with left to right flow around the Amplatzer device, suggesting possibly tissue erosion, however, the device appeared stable (not rocking).  Continue Nafcillin for now CT surgery evaluation recommended although he may be a poor surgical candidate at this time given Left sided endocarditis with size of vegetation  Monitor CBC and BMP Following    [name], [facility]     Date of Admission:  04/04/2020      Total days of antibiotics 5  Nafcillin 2/21 >> current  Cefazolin >> 2/21          ASSESSMENT:  Zachary Woods is a 55 y.o. male admitted with R sided weakness. Found to have MCA territory ischemic stroke and subsequent R frontal bleed in the wetting of MSSA bacteremia.   LP yesterday c/w bacterial meningitis (WBC 90 cells, 71% N, Protein 117, Gluc 104). Culture no growth x 24h but would presume MSSA given concern over CNS emboli from suspected endocarditis. Has stigmata c/w left sided IE with janeway lesions and osler's nodes to hands and feet. Switched to Nafcillin for preferred antibiotic penetration to CNS - no  severe allergic reaction observed, no cutaneous observed at this time. Will need ongoing monitoring.   Follow for neurologic recovery - further imaging with MRI per neurology/primary team.   Follow pending micro from blood and CSF. Hold on central access as hemodynamics permit.     PLAN: 1. Continue Nafcillin  2. Follow SCr  3. Hold on central access if able   Principal Problem:   MSSA bacteremia Active Problems:   Suspected endocarditis   PFO (patent foramen ovale)   Acute CVA (cerebrovascular accident) ([place])   Subarachnoid hemorrhage (HCC)   Type 2 diabetes mellitus with hyperosmolar hyperglycemic state (HHS) ([place])   AKI (acute kidney injury) ([place])   SIRS (systemic inflammatory response syndrome) (HCC)   Metabolic acidosis with increased anion gap and accumulation of organic acids   Hypomagnesemia   Hypokalemia   Thrombocytopenia ([place])   Stroke ([place])   . chlorhexidine gluconate (MEDLINE KIT)  15 mL Mouth Rinse BID  . Chlorhexidine Gluconate Cloth  6 each Topical Q0600  . docusate  100 mg Per Tube BID  . feeding supplement (PROSource TF)  45 mL Per Tube TID  . folic acid  1 mg Per Tube Daily  . heparin  5,000 Units Subcutaneous Q8H  . insulin aspart  0-15 Units Subcutaneous Q4H  . insulin aspart  4 Units Subcutaneous Q4H  . mouth rinse  15 mL Mouth Rinse 10 times per day  . multivitamin with minerals  1 tablet Per Tube Daily  . pantoprazole sodium  40 mg Per Tube Daily  . polyethylene glycol  17  g Per Tube Daily  . sodium chloride flush  3 mL Intravenous Once  . thiamine  100 mg Per Tube Daily  . valproic acid  500 mg Per Tube TID    SUBJECTIVE: Intubated    Review of Systems: Review of Systems  Unable to perform ROS: Patient nonverbal    Allergies  Allergen Reactions  . Penicillins Itching    Has patient had a PCN reaction causing immediate rash, facial/tongue/throat swelling, SOB or lightheadedness with hypotension: No Has patient had a PCN reaction  causing severe rash involving mucus membranes or skin necrosis: No Has patient had a PCN reaction that required hospitalization: No Has patient had a PCN reaction occurring within the last 10 years: Yes If all of the above answers are "NO", then may proceed with Cephalosporin use.    OBJECTIVE: Vitals:   04/24/20 0815 04/24/20 0900 04/24/20 1000 04/24/20 1100  BP: 101/66 (!) 92/58 (!) 122/105 108/70  Pulse: (!) 112 (!) 110 (!) 107 (!) 109  Resp: (!) 31 (!) 24 (!) 26 (!) 23  Temp:      TempSrc:      SpO2: 100% 97% 99% 98%  Weight:      Height:       Body mass index is 31.17 kg/m.  Physical Exam Constitutional:      Comments: Not responding to tactile or verbal stimuli. EEG ongoing.   Cardiovascular:     Rate and Rhythm: Regular rhythm. Tachycardia present.     Heart sounds: No murmur heard.   Pulmonary:     Effort: Pulmonary effort is normal.     Breath sounds: Normal breath sounds.  Abdominal:     General: Bowel sounds are normal.     Palpations: Abdomen is soft.  Musculoskeletal:        General: No swelling.     Cervical back: Normal range of motion. No rigidity.  Skin:    General: Skin is warm.     Findings: Lesion and rash (No lesions or rashes to abdomen aside from large purple ecchymosis to RLQ ) present.     Comments: Scattered lesions to feet and hands that are non-blanchable c/w Janeway lesions. Hands lesions raised in places c/w Osler's nodes.   Neurological:     Comments: Unresponsive on ventilator     Lab Results Lab Results  Component Value Date   WBC 7.6 04/24/2020   HGB 10.1 (L) 04/24/2020   HCT 28.2 (L) 04/24/2020   MCV 97.6 04/24/2020   PLT 63 (L) 04/24/2020    Lab Results  Component Value Date   CREATININE 1.92 (H) 04/24/2020   BUN 85 (H) 04/24/2020   NA 148 (H) 04/24/2020   K 3.4 (L) 04/24/2020   CL 111 04/24/2020   CO2 22 04/24/2020    Lab Results  Component Value Date   ALT 23 04/26/2020   AST 40 04/28/2020   ALKPHOS 127 (H)  04/13/2020   BILITOT 1.7 (H) 04/08/2020     Microbiology: BCx   Pertinent Imagings   TEE 04/24/20 ( personally reviewed) Findings:  1. LEFT VENTRICLE: The left ventricular wall thickness is mildly increased.  The left ventricular cavity is normal in size. Wall motion is normal.  LVEF is 60-65%.  2. RIGHT VENTRICLE:  The right ventricle is normal in structure and function without any thrombus or masses.    3. LEFT ATRIUM:  The left atrium is normal in size without any thrombus or masses.  There is not spontaneous echo  contrast ("smoke") in the left atrium consistent with a low flow state.  4. LEFT ATRIAL APPENDAGE:  The left atrial appendage is free of any thrombus or masses. The appendage is small and has a single lobe. Pulse doppler indicates moderate flow in the appendage.  5. ATRIAL SEPTUM:  The atrial septum was previously repaired with a 25 mm Amplatzer device. This device is noted and appears stable, however, there appears to be an inferior ASD with left to right flow - possibly from endocarditis associated tissue erosion. Microbubble contrast was unable to opacify the right atrium after many attempts, likely due to left to right shunting. There is a large fixed elliptical mass attached to the atrial septum and by a stalk to the posterior mitral leaflet, this is likely a vegetation.  6. RIGHT ATRIUM:  The right atrium is normal in size and function without any thrombus or masses.  7. MITRAL VALVE:  The mitral valve is normal in structure and function with trivial regurgitation.  There were no vegetations or stenosis.  8. AORTIC VALVE:  The aortic valve is trileaflet, normal in structure and function with no regurgitation.  There were no vegetations or stenosis  9. TRICUSPID VALVE:  The tricuspid valve is normal in structure and function with trivial regurgitation.  There were no vegetations or stenosis  10.  PULMONIC VALVE:  The pulmonic valve is normal in structure and  function with no regurgitation.  There were no vegetations or stenosis.   11. AORTIC ARCH, ASCENDING AND DESCENDING AORTA:  There was no Ron Parker et. Al, 1992) atherosclerosis of the ascending aorta, aortic arch, or proximal descending aorta.  12. PULMONARY VEINS: Anomalous pulmonary venous return was not noted.  13. PERICARDIUM: The pericardium appeared normal and non-thickened.  There is no pericardial effusion.  IMPRESSION:   1. 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  2. There is an inferior ring ASD with left to right flow around the Amplatzer device, suggesting possibly tissue erosion, however, the device appeared stable (not rocking). 3. LVEF 60-65%, normal wall motion  RECOMMENDATIONS:    1. CT surgical evaluation for Amplatzer removal, debridement and patch closure of ASD is ideal, however, given the patient's co-morbidities, this may not be an option at present     Janene Madeira, MSN, NP-C [facility] [email or web address] Pager: [phone] Office: [phone] [place]: [phone]

## 2020-04-24 NOTE — Progress Notes (Signed)
Patient's silver wedding band removed since his fingers are so swollen. Placed in a pink cup on the computer. Will give to wife to take home when she arrives this afternoon.

## 2020-04-24 NOTE — Progress Notes (Signed)
STROKE TEAM PROGRESS NOTE   INTERVAL HISTORY No family is at the bedside. Pt intubated, not responsive. Still has spiking fever overnight, Tmax 103. Yesterday LP c/f bacterial meningitis. Now on nafcillin. TEE today showed large vegetation at PFO closure device. CT Sx consulted, not candidate for procedure. ID on board.   Vitals:   04/24/20 1800 04/24/20 1900 04/24/20 2000 04/24/20 2035  BP: 106/66 102/78    Pulse: (!) 131 (!) 132    Resp: (!) 26 (!) 23  (!) 29  Temp:   99.5 F (37.5 C)   TempSrc:   Axillary   SpO2: 98% 99%  100%  Weight:      Height:       CBC:  Recent Labs  Lab 04/12/2020 0416 04/28/2020 0424 04/21/2020 1714 04/21/20 0423 2020-05-16 0405 04/24/20 0556  WBC 6.7  --  12.0*   < > 7.3 7.6  NEUTROABS 6.1  --  11.0*  --   --   --   HGB 15.4   < > 13.5   < > 11.5* 10.1*  HCT 42.0   < > 37.9*   < > 33.6* 28.2*  MCV 93.5  --  94.0   < > 97.4 97.6  PLT 74*  --  62*   < > 53* 63*   < > = values in this interval not displayed.   Basic Metabolic Panel:  Recent Labs  Lab 05-16-20 0405 16-May-2020 1630 04/24/20 0556  NA 145  --  148*  K 3.8  --  3.4*  CL 116*  --  111  CO2 17*  --  22  GLUCOSE 289*  --  274*  BUN 66*  --  85*  CREATININE 1.62*  --  1.92*  CALCIUM 7.8*  --  7.5*  MG 2.6* 2.8* 2.8*  PHOS 2.2* 3.1 5.5*   Lipid Panel:  Recent Labs  Lab 04/18/2020 1217 04/24/20 0556  CHOL 98  --   TRIG 327* 187*  HDL <10*  --   CHOLHDL NOT CALCULATED  --   VLDL 65*  --   LDLCALC NOT CALCULATED  --    HgbA1c:  Recent Labs  Lab 04/12/2020 1217  HGBA1C 10.5*   Urine Drug Screen:  Recent Labs  Lab 04/19/2020 1955  LABOPIA POSITIVE*  COCAINSCRNUR NONE DETECTED  LABBENZ NONE DETECTED  AMPHETMU NONE DETECTED  THCU NONE DETECTED  LABBARB NONE DETECTED    Alcohol Level No results for input(s): ETH in the last 168 hours.  IMAGING past 24 hours DG CHEST PORT 1 VIEW  Result Date: 04/24/2020 CLINICAL DATA:  Respiratory failure. EXAM: PORTABLE CHEST 1 VIEW  COMPARISON:  April 22, 2020. FINDINGS: The heart size and mediastinal contours are within normal limits. Endotracheal and nasogastric tubes appear to be in grossly good position. No pneumothorax or pleural effusion is noted. Both lungs are clear. The visualized skeletal structures are unremarkable. IMPRESSION: No active disease. Electronically Signed   By: Lupita Raider M.D.   On: 04/24/2020 09:46   DG Abd Portable 1V  Result Date: 04/24/2020 CLINICAL DATA:  OG tube placement EXAM: PORTABLE ABDOMEN - 1 VIEW COMPARISON:  None. FINDINGS: Enteric tube passes into the stomach. Unremarkable bowel gas pattern IMPRESSION: Enteric tube within the stomach. Electronically Signed   By: Guadlupe Spanish M.D.   On: 04/24/2020 15:44   ECHO TEE  Result Date: 04/24/2020    TRANSESOPHOGEAL ECHO REPORT   Patient Name:   Zachary Woods Date of Exam: 04/24/2020  Medical Rec #:  [id number]      Height:       66.0 in Accession #:    [id number]     Weight:       193.1 lb Date of Birth:  1965-04-29      BSA:          1.970 m Patient Age:    55 years       BP:           95/69 mmHg Patient Gender: M              HR:           117 bpm. Exam Location:  Inpatient Procedure: Transesophageal Echo, Cardiac Doppler, Color Doppler and 3D Echo Indications:     Bacteremia  History:         Patient has prior history of Echocardiogram examinations.                  Stroke, Signs/Symptoms:Bacteremia and Fever; Risk                  Factors:Hypertension, Dyslipidemia and Diabetes. PFO closure,                  seizure, MSSA.  Sonographer:     Lavenia Atlas Referring Phys:  [id number] Manson Passey Diagnosing Phys: Zachary Shutter MD PROCEDURE: The transesophogeal probe was passed without difficulty through the esophogus of the patient. Sedation performed by performing physician. The patient was monitored while under deep sedation. The patient developed no complications during the procedure. IMPRESSIONS  1. Left ventricular ejection fraction,  by estimation, is 60 to 65%. The left ventricle has normal function. There is mild left ventricular hypertrophy.  2. Right ventricular systolic function is normal. The right ventricular size is normal.  3. No left atrial/left atrial appendage thrombus was detected.  4. Pedunculated, extends toward the septal Amplatzer device vegetation on the mitral valve.  5. The mitral valve is abnormal. Trivial mitral valve regurgitation.  6. The aortic valve is tricuspid. Aortic valve regurgitation is not visualized.  7. Evidence of atrial level shunting detected by color flow Doppler. Agitated saline contrast did not cross right to left, due to left to right flow from ASD. Prior 24 mm Amplatzer device in place. There is a large 1 x 2 cm vegetation which is adherent to the device and appears to extend to the posterior mitral leaflet.. There is a small at the inferior rim of the Amplatzer device atrial septal defect with predominantly left to right shunting across the atrial septum. Conclusion(s)/Recommendation(s): Findings are concerning for vegetation/infective endocarditis as detailed above. Critical findings reported to Dr. Denese Killings and acknowledged at 1:00 pm on 04/24/20. FINDINGS  Left Ventricle: Left ventricular ejection fraction, by estimation, is 60 to 65%. The left ventricle has normal function. The left ventricular internal cavity size was normal in size. There is mild left ventricular hypertrophy. Right Ventricle: The right ventricular size is normal. No increase in right ventricular wall thickness. Right ventricular systolic function is normal. Left Atrium: Left atrial size was normal in size. No left atrial/left atrial appendage thrombus was detected. Right Atrium: Right atrial size was normal in size. Pericardium: There is no evidence of pericardial effusion. Mitral Valve: The mitral valve is abnormal. A pedunculated, extends toward the septal Amplatzer device vegetation is seen on the posterior mitral leaflet.  Trivial mitral valve regurgitation. Tricuspid Valve: The tricuspid valve is grossly normal. Tricuspid valve regurgitation is trivial. Aortic Valve:  The aortic valve is tricuspid. Aortic valve regurgitation is not visualized. Pulmonic Valve: The pulmonic valve was grossly normal. Pulmonic valve regurgitation is not visualized. Aorta: The aortic root and ascending aorta are structurally normal, with no evidence of dilitation. IAS/Shunts: Evidence of atrial level shunting detected by color flow Doppler. Agitated saline contrast was given intravenously to evaluate for intracardiac shunting. Agitated saline contrast bubble study was positive with shunting observed within 3-6 cardiac cycles suggestive of interatrial shunt. There is no evidence of an atrial septal defect. There is a small at the inferior rim of the Amplatzer device atrial septal defect with predominantly left to right shunting across the atrial septum. Prior 24 mm Amplatzer device in place. There is a large 1 x 2 cm vegetation which is adherent to the device and appears to extend to the posterior mitral leaflet. Zachary ShutterKenneth Hilty MD Electronically signed by Zachary ShutterKenneth Hilty MD Signature Date/Time: 04/24/2020/2:58:03 PM    Final    PHYSICAL EXAM  Temp:  [98.9 F (37.2 C)-100.9 F (38.3 C)] 99.5 F (37.5 C) (02/22 2000) Pulse Rate:  [105-132] 132 (02/22 1900) Resp:  [20-31] 29 (02/22 2035) BP: (88-122)/(58-105) 102/78 (02/22 1900) SpO2:  [94 %-100 %] 100 % (02/22 2035) FiO2 (%):  [30 %] 30 % (02/22 2035)  General - Well nourished, well developed, intubated on sedation.  Ophthalmologic - fundi not visualized due to noncooperation.  Cardiovascular - Regular rhythm but tachycardia.  Neuro - intubated on sedation, eyes closed, not following commands. With forced eye opening, eyes in mid position, not blinking to visual threat, doll's eyes absent, not tracking, pupil b/l 2mm, not reactive. Corneal reflex weakly present on the left but not on the  right, gag and cough present. Breathing over the vent.  Facial symmetry not able to test due to ET tube.  Tongue protrusion not cooperative. On pain stimulation, no movement of all extremities. DTR diminished and no babinski. Sensation, coordination and gait not tested.    ASSESSMENT/PLAN Zachary Woods is a 55 y.o. male with history of previous cryptogenic Left MCA stroke s/p PFO closure, lost to stroke follow up 2018, DM2, HTN, GERD presenting with aphasia, right facial droop and right sided weakness noted upon waking up at 0300 this morning. He slid to the ground from the bed after awakening. EMS was called and he was brought to ED in code stroke status. NIHSS of 23 was noted upon arrival.   Left MCA stroke d/t left MCA branch occlusion with unknown source with small right frontal SAH of unknown etiology.  Apparently no trauma  per wife.   Related to thrombocytopenia?  Code Stroke HCT: small single sulcal SAH  CTA- occlusion of a trifurcation branch of the left MCA.  No aneurysm  CTP - Ischemic area 37cc, ischemic core 21ml by CBF<30%, CBV - 28ml  MRI - Moderate to large acute left MCA territory cortical/subcortical infarct affecting the left frontal and parietal lobes, as well as left insula and subinsular region. 1.8 cm acute cortical/subcortical left occipital lobe infarct, a few additional punctate acute infarcts are questioned within the right frontoparietal white matter (versus artifact). Redemonstrated small volume subarachnoid hemorrhage overlying the anterior right frontal lobe.  CT head x 3 - unchanged  2D Echo EF 60-65%. No shunt. Septal Repair:25 mm Amplazter on 08/01/2016 noted.    TEE Large 1x2 cm vegetation associated with the Amplatzer atrial septal occlusion device    LDL   HgbA1c 10.5  Beta-2-glycoprotein pending d/t antiphospholipid syndrome  EEG  showed moderate diffuse encephalopathy, nonspecific etiology. No seizures or epileptiform discharges were seen.    DVT PPX: SCDS  HOLD Antiplatelets due to thrombocytopenia (Plt 47, stable)  On ASA  PTA  NPO, Cortrak tube placement ordered per CCM  Therapy recommendations:  TBD  Disposition:  TBD  Dr. Katrinka Blazing to bedside to discuss plan of care with Dr. Pearlean Brownie   Seizure  EEG 2/20 potential epileptogenicity arising from left temporal region as well as moderate diffuse encephalopathy  LTM 2/21 multiple seizures without clinical signs arising from left temporoparietal region   LTM 2/22 two seizures without clinical signs arising from left temporoparietal region, improving from previous day  On Keppra 1g bid    Hx of stroke PFO closure  07/2016 patient had left MCA small infarct.  MRA head and neck negative.  DVT, TTE, pan CT negative.  TCD bubble study showed a small PFO.  TEE positive for PFO.  LDL 68 and A1c 5.6.  THC positive.  Hypercoagulable work-up showed beta-2 glycoprotein IgG 57.  Discharged with aspirin.  PFO closure done in 08/2016.  Repeat TTE showed good PFO closure procedure.  He was put on DAPT and Lipitor after PFO closure.  Repeat beta-2 glycoprotein IgG in 11/2016 was 90.  However, patient was lost follow-up with neurology.  Antiphospholipid syndrome was not diagnosed and was not treated.  MSSA bacteremia Septic shock Bacterial meningitis Endocarditis UTI  Tmax 101.3->103  Mild leukocytosis 10.8->9.8  Lactic acid 7.5->5.8->4.5  Antibiotics narrowed to nafcillin and rifampin and gentamycin  B Cx MSSA, serial Blood cultures NGTD  TEE Large 1x2 cm vegetation associated with the Amplatzer atrial septal occlusion device  Urine Cx - MSSA  CSF WBC 90 (Neutro 71%, lymph 29%), RBC 57, protein 117, glucose 104 (blood glucose 300s), Cx NGTD  ID on board  Management per CCM  Respiratory failure  Tachypnea with respiratory distress  Intubated 2/20  Not candidate for extubation  CCM on board  Hx of HTN hypotension . HTN meds at home: norvasc  daily, hctz  12.5mg  daily, not ordered. . Hypotension off neo . on MIVF at 150cc/hr -> 75cc . Keep BP at least greater than 100 systolic.  Marland Kitchen Long-term BP goal normotensive  Hyperlipidemia  Home meds: None. Per chart review he stopped statin 2018 d/t myalgia  TG 327   LDL direct 15.3, goal < 70  No statin indicated now given low LDL  Diabetes type II UnControlled  Home meds:  Glucophage  daily   HgbA1c 10.5, goal < 7.0  Hyperosmolar hyperglycemic on admission  Was on insulin drip, now off  CBGs  SSI  Close PCP follow up after discharge  Thrombocytopenia  Plt 74->62->45->47->63  Likely reactive to current critical illness  Hepatitis panel non-reactive  ASA discontinued  HOLD antiplatelet meds   Still on heparin subq Q8 - OK to continue for now  AKI  AKI Cre 1.55->1.23->1.62->1.92  On IVF  CCM on board   Other Stroke Risk Factors   Former Cigarette smoker  Other Active Problems  Metabolic Acidosis, Initial anion gap 16 with CO2 19, improving with anion gap 12 On sodium bicarb infusion  Hyponatremia->hypernatremia, Na 124->126->135->133->136->140->145->148   Hospital day # 4   This patient is critically ill due to stroke, endocarditis, bacterial meningitis, UTI, AKI, seizure, thrombocytopenia, septic shock, respiratory failure and at significant risk of neurological worsening, death form septic shock, recurrent stroke, renal failure, status epilepticus, heart failure. This patient's care requires constant monitoring of vital signs, hemodynamics, respiratory  and cardiac monitoring, review of multiple databases, neurological assessment, discussion with family, other specialists and medical decision making of high complexity. I spent 40 minutes of neurocritical care time in the care of this patient. I have discussed with Dr. Denese Killings.  Marvel Plan, MD PhD Stroke Neurology 04/24/2020 10:10 PM   To contact Stroke Continuity provider, please refer to  [email or web address]. After hours, contact General Neurology

## 2020-04-24 NOTE — Progress Notes (Signed)
PT Cancellation Note  Patient Details Name: ERIQ HUFFORD MRN: [id number] DOB: 03/09/1965   Cancelled Treatment:    Reason Eval/Treat Not Completed: Medical issues which prohibited therapy - pt awaiting TEE in 45 minutes, and per RN pt not medically ready for mobility at this time. PT to check back tomorrow.  Marye Round, PT Acute Rehabilitation Services Pager [phone]  Office [phone]    Truddie Coco 04/24/2020, 11:09 AM

## 2020-04-24 NOTE — H&P (Signed)
   INTERVAL PROCEDURE H&P  History and Physical Interval Note:  04/24/2020 12:08 PM  Zachary Woods has presented today for their planned procedure. The various methods of treatment have been discussed with the patient and family. After consideration of risks, benefits and other options for treatment, the patient has consented to the procedure.  The patients' outpatient history has been reviewed, patient examined, and no change in status from most recent office note within the past 30 days. I have reviewed the patients' chart and labs and will proceed as planned. Questions were answered to the patient's satisfaction.   Zachary Nose, MD, [facility], FACP  Zachary Woods  [facility]  Medical Director of the [facility]  Card[facility]ovascular Risk Reduction Clinic Diplomate of the American Board of Clinical Lipidology Attending Cardiologist  Direct Dial: [phone]  Fax: [phone]  Website:  [email or web address] 04/24/2020, 12:08 PM

## 2020-04-24 NOTE — Progress Notes (Signed)
Critical care attending attestation note:  Patient seen and examined and relevant ancillary tests reviewed.  I agree with the assessment and plan of care as outlined by Andreas Newport, MS IV .   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  On examination: He remains intubated and sedated.  There is minimal response to painful stimulation.  Chest is clear to auscultation.  No murmurs were appreciated.  He has multiple distal embolic purpuric lesions, and a petechial rash consistent with immune complex deposition.  Abdomen is soft and nontender.  He is tolerating tube feeds.  External urinary catheter is in place with adequate urine output  Continuous EEG personally reviewed and confirmed by epileptologist.  Shows no further seizures.  Occasional sharp discharges.  Transesophageal echo personally reviewed and discussed with cardiologist.  LV RV function normal there is a large sessile polypoid mass arising from the left side of the intra-atrial septum in the area of his prior occlusion device consistent with a large vegetation.  Mitral valve appears intact.  Assessment:  MSSA bacteremia with endocarditis involving prosthetic material Left MCA stroke Secondary bacterial meningitis Focal seizures Multi factorial encephalopathy due to above Acute kidney injury Peripheral embolic phenomena Secondary bacterial infection from hematogenous spread   Plan:  Antibiotic therapy as prescribed add rifampin and gentamicin for synergy as per ID Wean IV sedation monitor for recurrent seizures SBT and  extubate as mental status allows Discussed with Dr. Cliffton Asters: Not presently a candidate for surgery.  Would need to improve from a neurological standpoint and likely be extubated with reasonable neurological recovery and no evidence of mycotic aneurysms on MRA   CRITICAL CARE Performed by: Lynnell Catalan   Total critical care time: 55 minutes  Critical care time was exclusive of separately billable procedures and treating other patients.  Critical care was necessary to treat or prevent imminent or life-threatening deterioration.  Critical care was time spent personally by me on the following activities: development of treatment plan with patient and/or surrogate as well as nursing, discussions with consultants, evaluation of patient's response to treatment, examination of patient, obtaining history from patient or surrogate, ordering and performing treatments and interventions, ordering and review of laboratory studies, ordering and review of radiographic studies, pulse oximetry, re-evaluation of patient's condition and participation in multidisciplinary rounds.  Lynnell Catalan, MD [facility] ICU Physician [facility] [place] [facility]  Pager: [phone] Mobile: [phone] After hours: [phone].  04/24/2020, 2:35 PM

## 2020-04-24 NOTE — Progress Notes (Signed)
Inpatient Diabetes Program Recommendations  AACE/ADA: New Consensus Statement on Inpatient Glycemic Control   Target Ranges:  Prepandial:   less than 140 mg/dL      Peak postprandial:   less than 180 mg/dL (1-2 hours)      Critically ill patients:  140 - 180 mg/dL   Results for Zachary Woods, Zachary Woods (MRN [id number]) as of 04/24/2020 11:50  Ref. Range Apr 30, 2020 07:36 04/27/2020 12:16 April 30, 2020 15:11 04/15/2020 19:25 04/05/2020 23:13 04/24/2020 03:19 04/24/2020 07:28 04/24/2020 11:25  Glucose-Capillary Latest Ref Range: 70 - 99 mg/dL 355 (H) 732 (H) 202 (H) 313 (H) 358 (H) 259 (H) 217 (H) 200 (H)   Review of Glycemic Control  Diabetes history: DM2 Outpatient Diabetes medications: Metformin 500 mg QAM Current orders for Inpatient glycemic control: Lantus 15 units BID (just ordered today),Novolog 0-15 units Q4H, Novolog 4 units Q4H for tube feeding coverage; Vital @ 60 ml/hr  Inpatient Diabetes Program Recommendations:    Insulin: Noted Lantus ordered today. If glucose remains consistently over 180 mg/dl with added Lantus, would recommend discontinuing all SQ insulin and ordering ICU Glycemic Control Phase 2 IV insulin to improve glucose and help determine insulin needs.  Thanks, Orlando Penner, RN, MSN, CDE Diabetes Coordinator Inpatient Diabetes Program [phone] (Team Pager from 8am to 5pm)

## 2020-04-25 ENCOUNTER — Inpatient Hospital Stay (HOSPITAL_COMMUNITY): Payer: 59

## 2020-04-25 ENCOUNTER — Other Ambulatory Visit: Payer: Self-pay

## 2020-04-25 DIAGNOSIS — R7881 Bacteremia: Secondary | ICD-10-CM | POA: Diagnosis not present

## 2020-04-25 DIAGNOSIS — B9561 Methicillin susceptible Staphylococcus aureus infection as the cause of diseases classified elsewhere: Secondary | ICD-10-CM | POA: Diagnosis not present

## 2020-04-25 DIAGNOSIS — Q211 Atrial septal defect: Secondary | ICD-10-CM

## 2020-04-25 DIAGNOSIS — I330 Acute and subacute infective endocarditis: Secondary | ICD-10-CM | POA: Diagnosis not present

## 2020-04-25 DIAGNOSIS — R569 Unspecified convulsions: Secondary | ICD-10-CM | POA: Diagnosis not present

## 2020-04-25 DIAGNOSIS — I369 Nonrheumatic tricuspid valve disorder, unspecified: Secondary | ICD-10-CM

## 2020-04-25 DIAGNOSIS — I6340 Cerebral infarction due to embolism of unspecified cerebral artery: Secondary | ICD-10-CM | POA: Diagnosis not present

## 2020-04-25 DIAGNOSIS — I639 Cerebral infarction, unspecified: Secondary | ICD-10-CM | POA: Diagnosis not present

## 2020-04-25 DIAGNOSIS — I63412 Cerebral infarction due to embolism of left middle cerebral artery: Secondary | ICD-10-CM | POA: Diagnosis not present

## 2020-04-25 LAB — CULTURE, BLOOD (ROUTINE X 2)
Culture: NO GROWTH
Culture: NO GROWTH
Special Requests: ADEQUATE
Special Requests: ADEQUATE

## 2020-04-25 LAB — PATHOLOGIST SMEAR REVIEW

## 2020-04-25 LAB — CBC
HCT: 30.7 % — ABNORMAL LOW (ref 39.0–52.0)
Hemoglobin: 10.8 g/dL — ABNORMAL LOW (ref 13.0–17.0)
MCH: 34.2 pg — ABNORMAL HIGH (ref 26.0–34.0)
MCHC: 35.2 g/dL (ref 30.0–36.0)
MCV: 97.2 fL (ref 80.0–100.0)
Platelets: 76 10*3/uL — ABNORMAL LOW (ref 150–400)
RBC: 3.16 MIL/uL — ABNORMAL LOW (ref 4.22–5.81)
RDW: 15 % (ref 11.5–15.5)
WBC: 7.5 10*3/uL (ref 4.0–10.5)
nRBC: 0.3 % — ABNORMAL HIGH (ref 0.0–0.2)

## 2020-04-25 LAB — BASIC METABOLIC PANEL
Anion gap: 17 — ABNORMAL HIGH (ref 5–15)
BUN: 127 mg/dL — ABNORMAL HIGH (ref 6–20)
CO2: 19 mmol/L — ABNORMAL LOW (ref 22–32)
Calcium: 7.4 mg/dL — ABNORMAL LOW (ref 8.9–10.3)
Chloride: 113 mmol/L — ABNORMAL HIGH (ref 98–111)
Creatinine, Ser: 2.93 mg/dL — ABNORMAL HIGH (ref 0.61–1.24)
GFR, Estimated: 25 mL/min — ABNORMAL LOW (ref >60)
Glucose, Bld: 343 mg/dL — ABNORMAL HIGH (ref 70–99)
Potassium: 3.8 mmol/L (ref 3.5–5.1)
Sodium: 149 mmol/L — ABNORMAL HIGH (ref 135–145)

## 2020-04-25 LAB — GLUCOSE, CAPILLARY
Glucose-Capillary: 281 mg/dL — ABNORMAL HIGH (ref 70–99)
Glucose-Capillary: 303 mg/dL — ABNORMAL HIGH (ref 70–99)
Glucose-Capillary: 344 mg/dL — ABNORMAL HIGH (ref 70–99)
Glucose-Capillary: 287 mg/dL — ABNORMAL HIGH (ref 70–99)
Glucose-Capillary: 260 mg/dL — ABNORMAL HIGH (ref 70–99)
Glucose-Capillary: 325 mg/dL — ABNORMAL HIGH (ref 70–99)

## 2020-04-25 LAB — CRYPTOCOCCAL ANTIGEN, CSF: Crypto Ag: NEGATIVE

## 2020-04-25 LAB — HSV 1/2 PCR, CSF
HSV-1 DNA: NEGATIVE
HSV-2 DNA: NEGATIVE

## 2020-04-25 MED ORDER — DILTIAZEM HCL 25 MG/5ML IV SOLN
5.0000 mg | Freq: Once | INTRAVENOUS | Status: DC
  Filled 2020-04-25: qty 5

## 2020-04-25 MED ORDER — POLYETHYLENE GLYCOL 3350 17 G PO PACK: 17.0000 g | PACK | Freq: Every day | ORAL | Status: DC | PRN

## 2020-04-25 MED ORDER — FREE WATER
200.0000 mL | Status: DC
  Administered 2020-04-25 – 2020-04-26 (×4): 200 mL

## 2020-04-25 MED ORDER — AMIODARONE IV BOLUS ONLY 150 MG/100ML
150.0000 mg | Freq: Once | INTRAVENOUS | Status: AC
  Administered 2020-04-25: 150 mg via INTRAVENOUS
  Filled 2020-04-25: qty 100

## 2020-04-25 MED ORDER — AMIODARONE HCL IN DEXTROSE 360-4.14 MG/200ML-% IV SOLN
INTRAVENOUS | Status: AC
  Administered 2020-04-25: 60 mg/h via INTRAVENOUS
  Filled 2020-04-25: qty 200

## 2020-04-25 MED ORDER — AMIODARONE HCL IN DEXTROSE 360-4.14 MG/200ML-% IV SOLN
60.0000 mg/h | INTRAVENOUS | Status: DC
  Administered 2020-04-25 (×2): 30 mg/h via INTRAVENOUS
  Administered 2020-04-26 (×2): 60 mg/h via INTRAVENOUS
  Filled 2020-04-25 (×4): qty 200

## 2020-04-25 MED ORDER — INSULIN GLARGINE 100 UNIT/ML ~~LOC~~ SOLN
20.0000 [IU] | Freq: Two times a day (BID) | SUBCUTANEOUS | Status: DC
  Administered 2020-04-25 – 2020-04-26 (×3): 20 [IU] via SUBCUTANEOUS
  Filled 2020-04-25 (×4): qty 0.2

## 2020-04-25 MED ORDER — FUROSEMIDE 10 MG/ML IJ SOLN
80.0000 mg | Freq: Once | INTRAMUSCULAR | Status: AC
  Administered 2020-04-25: 80 mg via INTRAVENOUS
  Filled 2020-04-25: qty 8

## 2020-04-25 MED ORDER — AMIODARONE HCL IN DEXTROSE 360-4.14 MG/200ML-% IV SOLN
60.0000 mg/h | INTRAVENOUS | Status: AC
  Administered 2020-04-25: 60 mg/h via INTRAVENOUS
  Filled 2020-04-25: qty 200

## 2020-04-25 MED ORDER — DOCUSATE SODIUM 50 MG/5ML PO LIQD: 100.0000 mg | Freq: Two times a day (BID) | ORAL | Status: DC | PRN

## 2020-04-25 MED ORDER — INSULIN ASPART 100 UNIT/ML ~~LOC~~ SOLN
6.0000 [IU] | SUBCUTANEOUS | Status: DC
  Administered 2020-04-25 – 2020-04-26 (×4): 6 [IU] via SUBCUTANEOUS

## 2020-04-25 MED ORDER — LEVETIRACETAM IN NACL 500 MG/100ML IV SOLN
500.0000 mg | Freq: Two times a day (BID) | INTRAVENOUS | Status: DC
  Administered 2020-04-25 – 2020-04-27 (×4): 500 mg via INTRAVENOUS
  Filled 2020-04-25 (×4): qty 100

## 2020-04-25 NOTE — Progress Notes (Addendum)
eLink Physician-Brief Progress Note Patient Name: [name] DOB: 10-09-1965 MRN: [id number]   Date of Service  04/25/2020  HPI/Events of Note  Afib with RVR persists with rate 140 despite a bolus of Amiodarone and continuing Amiodarone infusion.  eICU Interventions  Cardizem 5 mg iv x 1 ordered, and bedside RN instructed to notify cardiology as well (they are following the patient).        Migdalia Dk 04/25/2020, 9:44 PM

## 2020-04-25 NOTE — Progress Notes (Signed)
SLP Cancellation Note  Patient Details Name: Zachary Woods MRN: [id number] DOB: 10-31-1965   Cancelled treatment:        Pt intubated 2/20. Noted goal to start weaning Propofol. Will follow to see if he is able to extubate over the next several days.    Royce Macadamia 04/25/2020, 2:19 PM Breck Coons Lonell Face.Ed Nurse, children's [phone] Office [phone]

## 2020-04-25 NOTE — Progress Notes (Addendum)
STROKE TEAM PROGRESS NOTE   INTERVAL HISTORY No family is at the bedside. Pt intubated, not responsive. Neurologically no change from yesterday. However, pt had afib RVR and currently on amiodarone. EEG showed no seizure but epileptic discharges. Continues on Abx.   Vitals:   04/25/20 1931 04/25/20 2000 04/25/20 2100 04/25/20 2200  BP:  (!) 101/55 (!) 101/59 101/63  Pulse: (!) 133 70 (!) 49 (!) 101  Resp: (!) 30 (!) 29 (!) 28 (!) 30  Temp:  (!) 100.4 F (38 C)    TempSrc:  Axillary    SpO2: 100% 100% 100% 100%  Weight:      Height:       CBC:  Recent Labs  Lab 2020/05/18 0416 May 18, 2020 0424 2020/05/18 1714 04/21/20 0423 04/24/20 0556 04/25/20 0521  WBC 6.7  --  12.0*   < > 7.6 7.5  NEUTROABS 6.1  --  11.0*  --   --   --   HGB 15.4   < > 13.5   < > 10.1* 10.8*  HCT 42.0   < > 37.9*   < > 28.2* 30.7*  MCV 93.5  --  94.0   < > 97.6 97.2  PLT 74*  --  62*   < > 63* 76*   < > = values in this interval not displayed.   Basic Metabolic Panel:  Recent Labs  Lab 04/29/2020 1630 04/24/20 0556 04/25/20 0521  NA  --  148* 149*  K  --  3.4* 3.8  CL  --  111 113*  CO2  --  22 19*  GLUCOSE  --  274* 343*  BUN  --  85* 127*  CREATININE  --  1.92* 2.93*  CALCIUM  --  7.5* 7.4*  MG 2.8* 2.8*  --   PHOS 3.1 5.5*  --    Lipid Panel:  Recent Labs  Lab 05/18/2020 1217 04/24/20 0556  CHOL 98  --   TRIG 327* 187*  HDL <10*  --   CHOLHDL NOT CALCULATED  --   VLDL 65*  --   LDLCALC NOT CALCULATED  --    HgbA1c:  Recent Labs  Lab 05/18/2020 1217  HGBA1C 10.5*   Urine Drug Screen:  Recent Labs  Lab 2020-05-18 1955  LABOPIA POSITIVE*  COCAINSCRNUR NONE DETECTED  LABBENZ NONE DETECTED  AMPHETMU NONE DETECTED  THCU NONE DETECTED  LABBARB NONE DETECTED    Alcohol Level No results for input(s): ETH in the last 168 hours.  IMAGING past 24 hours US Abdomen Complete  Result Date: 04/25/2020 CLINICAL DATA:  Renal failure EXAM: ABDOMEN ULTRASOUND COMPLETE COMPARISON:  Ultrasound  04/21/2020 FINDINGS: Gallbladder: No shadowing stones. Unable to assess sonographic Eulah Pont due to ventilated patient. Interval development of marked gallbladder wall thickening measuring up to 11 mm. Common bile duct: Diameter: 2.8 mm Liver: Subtle contour nodularity suggestive of cirrhosis. No focal hepatic abnormality. Liver is slightly echogenic. Portal vein is patent on color Doppler imaging with normal direction of blood flow towards the liver. IVC: No abnormality visualized. Pancreas: Visualized portion unremarkable. Spleen: Size and appearance within normal limits. Right Kidney: Length: 9.1 cm. Echogenicity within normal limits. No mass or hydronephrosis visualized. Left Kidney: Length: 11.3 cm. Echogenicity within normal limits. No mass or hydronephrosis visualized. Abdominal aorta: No aneurysm visualized. Distal aorta obscured by bowel gas Other findings: None. IMPRESSION: 1. Negative for hydronephrosis. 2. Interval development of marked gallbladder wall thickening measuring up to 11 mm, but no evidence for shadowing stone. Findings  could be secondary to a calculus cholecystitis versus gallbladder wall thickening secondary to liver disease or edema forming states. Correlation with nuclear medicine hepatobiliary imaging could be considered. 3. Echogenic liver.  Subtle contour nodularity suggesting cirrhosis. Electronically Signed   By: Jasmine Pang M.D.   On: 04/25/2020 18:18   PHYSICAL EXAM  Temp:  [97.5 F (36.4 C)-103.5 F (39.7 C)] 100.4 F (38 C) (02/23 2000) Pulse Rate:  [25-141] 101 (02/23 2200) Resp:  [21-31] 30 (02/23 2200) BP: (95-112)/(53-82) 101/63 (02/23 2200) SpO2:  [96 %-100 %] 100 % (02/23 2200) FiO2 (%):  [30 %] 30 % (02/23 1931) Weight:  [87.6 kg] 87.6 kg (02/23 0500)  General - Well nourished, well developed, intubated on sedation.  Ophthalmologic - fundi not visualized due to noncooperation.  Cardiovascular - irregularly irregular heart rate and rhythm.  Neuro -  intubated on sedation, eyes closed, not following commands. With forced eye opening, eyes in mid position, not blinking to visual threat, doll's eyes absent, not tracking, pupil b/l 75mm, not reactive. Corneal reflex weakly present on the left but not on the right, gag and cough present. Breathing over the vent.  Facial symmetry not able to test due to ET tube.  Tongue protrusion not cooperative. On pain stimulation, no movement of all extremities. DTR diminished and no babinski. Sensation, coordination and gait not tested.    ASSESSMENT/PLAN [name] is a 55 y.o. male with history of previous cryptogenic Left MCA stroke s/p PFO closure, lost to stroke follow up 2018, DM2, HTN, GERD presenting with aphasia, right facial droop and right sided weakness noted upon waking up at 0300 this morning. He slid to the ground from the bed after awakening. EMS was called and he was brought to ED in code stroke status. NIHSS of 23 was noted upon arrival.   Left MCA stroke d/t left MCA branch occlusion with unknown source with small right frontal SAH of unknown etiology.  Apparently no trauma  per wife.   Related to thrombocytopenia?  Code Stroke HCT: small single sulcal SAH  CTA- occlusion of a trifurcation branch of the left MCA.  No aneurysm  CTP - Ischemic area 37cc, ischemic core 30ml by CBF<30%, CBV - 87ml  MRI - Moderate to large acute left MCA territory cortical/subcortical infarct affecting the left frontal and parietal lobes, as well as left insula and subinsular region. 1.8 cm acute cortical/subcortical left occipital lobe infarct, a few additional punctate acute infarcts are questioned within the right frontoparietal white matter (versus artifact). Redemonstrated small volume subarachnoid hemorrhage overlying the anterior right frontal lobe.  CT head x 3 - unchanged  2D Echo EF 60-65%. No shunt. Septal Repair:25 mm Amplazter on 08/01/2016 noted.    TEE Large 1x2 cm vegetation associated  with the Amplatzer atrial septal occlusion device  LDL   HgbA1c 10.5  Beta-2-glycoprotein repeat 94   DVT PPX: SCDS  On ASA 81mg  PTA, now HOLD Antiplatelets due to thrombocytopenia  Therapy recommendations:  TBD  Disposition:  TBD  Seizure  EEG 2/20 potential epileptogenicity arising from left temporal region as well as moderate diffuse encephalopathy  LTM 2/21 multiple seizures without clinical signs arising from left temporoparietal region   LTM 2/22 two seizures without clinical signs arising from left temporoparietal region, improving from previous day  LEM 2/23 epileptogenicity arising from left temporoparietal region. Additionally there ismoderate diffuse encephalopathy, nonspecific etiology.  On Keppra   Antiphospholipid syndrome  07/2016 beta-2 glycoprotein IgG = 57  11/2016 =  90  04/2020 = 94  Will need long term AC once appropriate  afib RVR  Developed overnight 2/22  On amiodarone IV  cardizem IV x 1  Will need long term AC once appropriate  Hx of stroke PFO closure  07/2016 patient had left MCA small infarct.  MRA head and neck negative.  DVT, TTE, pan CT negative.  TCD bubble study showed a small PFO.  TEE positive for PFO.  LDL 68 and A1c 5.6.  THC positive.  Hypercoagulable work-up showed beta-2 glycoprotein IgG 57.  Discharged with aspirin.  PFO closure done in 08/2016.  Repeat TTE showed good PFO closure procedure.  He was put on DAPT and Lipitor after PFO closure.  Repeat beta-2 glycoprotein IgG in 11/2016 was 90.  However, patient was lost follow-up with neurology.  Antiphospholipid syndrome was not diagnosed and was not treated.  MSSA bacteremia Septic shock Bacterial meningitis Endocarditis UTI  Tmax 101.3->103->103.5  Mild leukocytosis 10.8->9.8->7.5  Lactic acid 7.5->5.8->4.5->2.9  Antibiotics narrowed to nafcillin and rifampin  B Cx MSSA, serial Blood cultures NGTD  TEE Large 1x2 cm vegetation associated with the Amplatzer  atrial septal occlusion device  Urine Cx - MSSA  CSF WBC 90 (Neutro 71%, lymph 29%), RBC 57, protein 117, glucose 104 (blood glucose 300s), Cx NGTD  ID on board  Management per CCM  Respiratory failure  Tachypnea with respiratory distress  Intubated 2/20  Not candidate for extubation  CCM on board  Hx of HTN hypotension . HTN meds at home: norvasc 10mg  daily, hctz 12.5mg  daily, not ordered. . Hypotension off neo . on MIVF at 150cc/hr -> 75cc . Keep BP at least greater than 100 systolic.  Long-term BP goal normotensive  Hyperlipidemia  Home meds: None. Per chart review he stopped statin 2018 d/t myalgia  TG 327   LDL direct 15.3, goal < 70  No statin indicated now given low LDL  Diabetes type II UnControlled  Home meds:  Glucophage 500mg  daily   HgbA1c 10.5, goal < 7.0  Hyperosmolar hyperglycemic on admission  Was on insulin drip -> lantus  CBGs  SSI  Close PCP follow up after discharge  Thrombocytopenia  Plt 74->62->45->47->63->75  Likely reactive to current critical illness  Hepatitis panel non-reactive  ASA discontinued  HOLD antiplatelet meds   Still on heparin subq Q8 - OK to continue for now  AKI  AKI Cre 1.55->1.23->1.62->1.92->2.93  On IVF  CCM on board  Other Stroke Risk Factors   Former Cigarette smoker  Other Active Problems  Metabolic Acidosis, Initial anion gap 16 with CO2 19, improving with anion gap 12 On sodium bicarb infusion  Hyponatremia->hypernatremia, Na 124->126->135->133->136->140->145->148->149   Hospital day # 5   This patient is critically ill due to large endocarditis, septic shock, bacterial meningitis, antiphospholipid syndrome, afib RVR, UTI, respiratory failure, seizure and at significant risk of neurological worsening, death form heart failure, recurrent stroke, embolism, status epilepitcus. This patient's care requires constant monitoring of vital signs, hemodynamics, respiratory and cardiac  monitoring, review of multiple databases, neurological assessment, discussion with family, other specialists and medical decision making of high complexity. I spent 35 minutes of neurocritical care time in the care of this patient. I have discussed with Dr. 2019.  , MD PhD Stroke Neurology 04/25/2020 11:15 PM   To contact Stroke Continuity provider, please refer to Marvel Plan. After hours, contact General Neurology

## 2020-04-25 NOTE — Progress Notes (Signed)
[facility] Infectious Diseases Follow Up Note  Patient Identification: Patient Name: Zachary Woods MRN: [id number] Admit Date: 04/24/2020  4:07 AM Age: 55 y.o.Today's Date: 04/25/2020   Reason for Visit: MSSA bacteremia   Principal Problem:   MSSA bacteremia Active Problems:   PFO (patent foramen ovale)   Acute CVA (cerebrovascular accident) (HCC)   Subarachnoid hemorrhage (HCC)   Type 2 diabetes mellitus with hyperosmolar hyperglycemic state (HHS) (HCC)   AKI (acute kidney injury) (HCC)   SIRS (systemic inflammatory response syndrome) (HCC)   Metabolic acidosis with increased anion gap and accumulation of organic acids   Hypomagnesemia   Hypokalemia   Thrombocytopenia (HCC)   Stroke (HCC)   Suspected endocarditis  Antibiotics : Nafcillin 2/22 -                          Rifampin 2/22 -                     Gentamicin 2/22 -                     Total days of antibiotics 6  Lines/tubes : PIVs, external urethral catheter, rectal tube , PIVs   Interval Events: febrile 103.5, WBC 7, remains intubated - not responsive. 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   # Acute bacterial meningitis  # Acute Left MCA Ischemic Infarct ( left frontal, parietal and occipital lobes) # Rt frontal SAH ? Concerns for septic emboli and mycotic aneurysm  # Thrombocytopenia - in the setting of sepsis/endocarditis  # AKI  # Seizures   Recommendations Continue Naficillin and Rifampin as is Hold gentamicin for now given AKI  Monitor CBC, CMP Vent Management per CCM  Seizure management per Neurology  Following   Rest of the management as per the primary team. Thank you for the consult. Please page with pertinent questions or  concerns. ______________________________________________________________________ Subjective patient seen and examined at the bedside. Intubated and non responsive.   Vitals BP 97/82   Pulse (!) 35   Temp (!) 97.5 F (36.4 C) (Axillary)   Resp (!) 21   Ht 5\' 6"  (1.676 m)   Wt 87.6 kg   SpO2 100%   BMI 31.17 kg/m     Physical Exam Intubated, non responsive HEENT - injected conunctiva Chest - vent sounds, coarse CVS - Normal s1s2, RRR Abdomen - soft, BS+ Extremities - edematous Skin - multiple non blanching erythematous lesions in bilateral hand and feet ( appears to be stable)  Pertinent Microbiology Results for orders placed or performed during the hospital encounter of 04/10/2020  Resp Panel by RT-PCR (Flu A&B, Covid) Nasopharyngeal Swab     Status: None   Collection Time: 04/19/2020  4:20 AM   Specimen: Nasopharyngeal Swab; Nasopharyngeal(NP) swabs in vial transport medium  Result Value Ref Range Status   SARS Coronavirus 2 by RT PCR NEGATIVE NEGATIVE Final    Comment: (NOTE) SARS-CoV-2 target nucleic acids are NOT DETECTED.  The SARS-CoV-2 RNA is generally detectable in upper respiratory specimens during the acute phase of infection. The lowest concentration of SARS-CoV-2 viral copies this assay can detect is 138 copies/mL. A negative result does not preclude SARS-Cov-2 infection and should not be used as the sole  basis for treatment or other patient management decisions. A negative result may occur with  improper specimen collection/handling, submission of specimen other than nasopharyngeal swab, presence of viral mutation(s) within the areas targeted by this assay, and inadequate number of viral copies(<138 copies/mL). A negative result must be combined with clinical observations, patient history, and epidemiological information. The expected result is Negative.  Fact Sheet for Patients:  BloggerCourse.com  Fact Sheet for Healthcare  Providers:  SeriousBroker.it  This test is no t yet approved or cleared by the [place] FDA and  has been authorized for detection and/or diagnosis of SARS-CoV-2 by FDA under an Emergency Use Authorization (EUA). This EUA will remain  in effect (meaning this test can be used) for the duration of the COVID-19 declaration under Section 564(b)(1) of the Act, 21 U.S.C.section 360bbb-3(b)(1), unless the authorization is terminated  or revoked sooner.       Influenza A by PCR NEGATIVE NEGATIVE Final   Influenza B by PCR NEGATIVE NEGATIVE Final    Comment: (NOTE) The Xpert Xpress SARS-CoV-2/FLU/RSV plus assay is intended as an aid in the diagnosis of influenza from Nasopharyngeal swab specimens and should not be used as a sole basis for treatment. Nasal washings and aspirates are unacceptable for Xpert Xpress SARS-CoV-2/FLU/RSV testing.  Fact Sheet for Patients: BloggerCourse.com  Fact Sheet for Healthcare Providers: SeriousBroker.it  This test is not yet approved or cleared by the [place] FDA and has been authorized for detection and/or diagnosis of SARS-CoV-2 by FDA under an Emergency Use Authorization (EUA). This EUA will remain in effect (meaning this test can be used) for the duration of the COVID-19 declaration under Section 564(b)(1) of the Act, 21 U.S.C. section 360bbb-3(b)(1), unless the authorization is terminated or revoked.  Performed at [facility], [address], [place], [place] [address]   Culture, blood (routine x 2)     Status: Abnormal   Collection Time: 04/29/2020  5:15 PM   Specimen: Site Not Specified; Blood  Result Value Ref Range Status   Specimen Description SITE NOT SPECIFIED  Final   Special Requests   Final    BOTTLES DRAWN AEROBIC AND ANAEROBIC Blood Culture adequate volume   Culture  Setup Time   Final    IN BOTH AEROBIC AND ANAEROBIC BOTTLES GRAM POSITIVE  [facility] CRITICAL RESULT CALLED TO, READ BACK BY AND VERIFIED WITH: J MILLEN PHARMD @0854  04/21/20 EB Performed at [facility], [address] [place], [place]    Culture STAPHYLOCOCCUS AUREUS (A)  Final   Report Status May 03, 2020 FINAL  Final   Organism ID, Bacteria STAPHYLOCOCCUS AUREUS  Final      Susceptibility   Staphylococcus aureus - MIC*    CIPROFLOXACIN <=0.5 SENSITIVE Sensitive     ERYTHROMYCIN <=0.25 SENSITIVE Sensitive     GENTAMICIN <=0.5 SENSITIVE Sensitive     OXACILLIN 0.5 SENSITIVE Sensitive     TETRACYCLINE >=16 RESISTANT Resistant     VANCOMYCIN <=0.5 SENSITIVE Sensitive     TRIMETH/SULFA <=10 SENSITIVE Sensitive     CLINDAMYCIN <=0.25 SENSITIVE Sensitive     RIFAMPIN <=0.5 SENSITIVE Sensitive     Inducible Clindamycin NEGATIVE Sensitive     * STAPHYLOCOCCUS AUREUS  Blood Culture ID Panel (Reflexed)     Status: Abnormal   Collection Time: 04/17/2020  5:15 PM  Result Value Ref Range Status   Enterococcus faecalis NOT DETECTED NOT DETECTED Final   Enterococcus Faecium NOT DETECTED NOT DETECTED Final   Listeria monocytogenes NOT  DETECTED NOT DETECTED Final   Staphylococcus species DETECTED (A) NOT DETECTED Final    Comment: CRITICAL RESULT CALLED TO, READ BACK BY AND VERIFIED WITH: J MILLEN PHARMD  04/21/20 EB    Staphylococcus aureus (BCID) DETECTED (A) NOT DETECTED Final    Comment: CRITICAL RESULT CALLED TO, READ BACK BY AND VERIFIED WITH: J MILLEN PHARMD  04/21/20 EB    Staphylococcus epidermidis NOT DETECTED NOT DETECTED Final   Staphylococcus lugdunensis NOT DETECTED NOT DETECTED Final   Streptococcus species NOT DETECTED NOT DETECTED Final   Streptococcus agalactiae NOT DETECTED NOT DETECTED Final   Streptococcus pneumoniae NOT DETECTED NOT DETECTED Final   Streptococcus pyogenes NOT DETECTED NOT DETECTED Final   A.calcoaceticus-baumannii NOT DETECTED NOT DETECTED Final   Bacteroides fragilis NOT DETECTED NOT DETECTED Final    Enterobacterales NOT DETECTED NOT DETECTED Final   Enterobacter cloacae complex NOT DETECTED NOT DETECTED Final   Escherichia coli NOT DETECTED NOT DETECTED Final   Klebsiella aerogenes NOT DETECTED NOT DETECTED Final   Klebsiella oxytoca NOT DETECTED NOT DETECTED Final   Klebsiella pneumoniae NOT DETECTED NOT DETECTED Final   Proteus species NOT DETECTED NOT DETECTED Final   Salmonella species NOT DETECTED NOT DETECTED Final   Serratia marcescens NOT DETECTED NOT DETECTED Final   Haemophilus influenzae NOT DETECTED NOT DETECTED Final   Neisseria meningitidis NOT DETECTED NOT DETECTED Final   Pseudomonas aeruginosa NOT DETECTED NOT DETECTED Final   Stenotrophomonas maltophilia NOT DETECTED NOT DETECTED Final   Candida albicans NOT DETECTED NOT DETECTED Final   Candida auris NOT DETECTED NOT DETECTED Final   Candida glabrata NOT DETECTED NOT DETECTED Final   Candida krusei NOT DETECTED NOT DETECTED Final   Candida parapsilosis NOT DETECTED NOT DETECTED Final   Candida tropicalis NOT DETECTED NOT DETECTED Final   Cryptococcus neoformans/gattii NOT DETECTED NOT DETECTED Final   Meth resistant mecA/C and MREJ NOT DETECTED NOT DETECTED Final    Comment: Performed at [facility], [address], [place], [place] [address]  MRSA PCR Screening     Status: None   Collection Time: 05/05/2020  7:36 PM   Specimen: Nasal Mucosa; Nasopharyngeal  Result Value Ref Range Status   MRSA by PCR NEGATIVE NEGATIVE Final    Comment:        The GeneXpert MRSA Assay (FDA approved for NASAL specimens only), is one component of a comprehensive MRSA colonization surveillance program. It is not intended to diagnose MRSA infection nor to guide or monitor treatment for MRSA infections. Performed at [facility], [address], [place], [place] [address]   Culture, blood (Routine X 2) w Reflex to ID Panel     Status: None   Collection Time: 2020/05/05  9:20 PM   Specimen: BLOOD LEFT HAND  Result  Value Ref Range Status   Specimen Description BLOOD LEFT HAND  Final   Special Requests AEROBIC BOTTLE ONLY Blood Culture adequate volume  Final   Culture   Final    NO GROWTH 5 DAYS Performed at [facility], [address], [place], [place] [address]    Report Status 04/25/2020 FINAL  Final  Culture, blood (routine x 2)     Status: None   Collection Time: 05-05-2020  9:27 PM   Specimen: BLOOD RIGHT HAND  Result Value Ref Range Status   Specimen Description BLOOD RIGHT HAND  Final   Special Requests AEROBIC BOTTLE ONLY Blood Culture adequate volume  Final   Culture  Final    NO GROWTH 5 DAYS Performed at [facility], [address] [place], [place] [address]    Report Status 04/25/2020 FINAL  Final  Urine Culture     Status: Abnormal   Collection Time: 04/21/20  5:48 AM   Specimen: Urine, Clean Catch  Result Value Ref Range Status   Specimen Description URINE, CLEAN CATCH  Final   Special Requests   Final    NONE Performed at [facility], [address] [place], [place] [address]    Culture 50,000 COLONIES/mL STAPHYLOCOCCUS AUREUS (A)  Final   Report Status 04/06/2020 FINAL  Final   Organism ID, Bacteria STAPHYLOCOCCUS AUREUS (A)  Final      Susceptibility   Staphylococcus aureus - MIC*    CIPROFLOXACIN <=0.5 SENSITIVE Sensitive     GENTAMICIN <=0.5 SENSITIVE Sensitive     NITROFURANTOIN <=16 SENSITIVE Sensitive     OXACILLIN <=0.25 SENSITIVE Sensitive     TETRACYCLINE >=16 RESISTANT Resistant     VANCOMYCIN <=0.5 SENSITIVE Sensitive     TRIMETH/SULFA <=10 SENSITIVE Sensitive     CLINDAMYCIN <=0.25 SENSITIVE Sensitive     RIFAMPIN <=0.5 SENSITIVE Sensitive     Inducible Clindamycin NEGATIVE Sensitive     * 50,000 COLONIES/mL STAPHYLOCOCCUS AUREUS  Culture, blood (routine x 2)     Status: Abnormal   Collection Time: 04/21/20 10:42 AM   Specimen: BLOOD RIGHT HAND  Result Value Ref Range Status   Specimen Description BLOOD RIGHT HAND  Final    Special Requests   Final    AEROBIC BOTTLE ONLY Blood Culture results may not be optimal due to an inadequate volume of blood received in culture bottles   Culture  Setup Time   Final    AEROBIC BOTTLE ONLY GRAM POSITIVE [facility] CRITICAL RESULT CALLED TO, READ BACK BY AND VERIFIED WITH: V BRYK PHARMD 04/24/20 2301 JDW    Culture (A)  Final    STAPHYLOCOCCUS AUREUS SUSCEPTIBILITIES PERFORMED ON PREVIOUS CULTURE WITHIN THE LAST 5 DAYS. Performed at [facility], [address] [place], [place] [address]    Report Status 04/25/2020 FINAL  Final  Culture, blood (routine x 2)     Status: None (Preliminary result)   Collection Time: 04/21/20 10:52 AM   Specimen: BLOOD RIGHT HAND  Result Value Ref Range Status   Specimen Description BLOOD RIGHT HAND  Final   Special Requests   Final    AEROBIC BOTTLE ONLY Blood Culture results may not be optimal due to an inadequate volume of blood received in culture bottles   Culture   Final    NO GROWTH 4 DAYS Performed at [facility], [address] [place], [place] [address]    Report Status PENDING  Incomplete  Culture, blood (routine x 2)     Status: None (Preliminary result)   Collection Time: 04/15/2020 10:01 AM   Specimen: BLOOD LEFT HAND  Result Value Ref Range Status   Specimen Description BLOOD LEFT HAND  Final   Special Requests   Final    BOTTLES DRAWN AEROBIC ONLY Blood Culture results may not be optimal due to an excessive volume of blood received in culture bottles   Culture   Final    NO GROWTH 2 DAYS Performed at [facility], [address] [place], [place] [address]    Report Status PENDING  Incomplete  CSF culture     Status: None (Preliminary result)   Collection  Time: 04/26/20 11:42 AM   Specimen: CSF; Cerebrospinal Fluid  Result Value Ref Range Status   Specimen Description CSF  Final   Special Requests LUMBAR  Final   Gram Stain   Final    CYTOSPIN SMEAR WBC PRESENT, PREDOMINANTLY MONONUCLEAR NO  ORGANISMS SEEN    Culture   Final    NO GROWTH 2 DAYS Performed at [facility], [address], [place], [place] [address]    Report Status PENDING  Incomplete    Pertinent Lab. CBC Latest Ref Rng & Units 04/25/2020 04/24/2020 04-26-2020  WBC 4.0 - 10.5 K/uL 7.5 7.6 7.3  Hemoglobin 13.0 - 17.0 g/dL 10.8(L) 10.1(L) 11.5(L)  Hematocrit 39.0 - 52.0 % 30.7(L) 28.2(L) 33.6(L)  Platelets 150 - 400 K/uL 76(L) 63(L) 53(L)   CMP Latest Ref Rng & Units 04/25/2020 04/24/2020 04/26/2020  Glucose 70 - 99 mg/dL 062(I) 948(N) 462(V)  BUN 6 - 20 mg/dL 035(K) 09(F) 81(W)  Creatinine 0.61 - 1.24 mg/dL 2.99(B) 7.16(R) 6.78(L)  Sodium 135 - 145 mmol/L 149(H) 148(H) 145  Potassium 3.5 - 5.1 mmol/L 3.8 3.4(L) 3.8  Chloride 98 - 111 mmol/L 113(H) 111 116(H)  CO2 22 - 32 mmol/L 19(L) 22 17(L)  Calcium 8.9 - 10.3 mg/dL 7.4(L) 7.5(L) 7.8(L)  Total Protein 6.5 - 8.1 g/dL - - -  Total Bilirubin 0.3 - 1.2 mg/dL - - -  Alkaline Phos 38 - 126 U/L - - -  AST 15 - 41 U/L - - -  ALT 0 - 44 U/L - - -     Pertinent Imaging today Plain films and CT images have been personally visualized and interpreted; radiology reports have been reviewed. Decision making incorporated into the Impression / Recommendations.  I have spent approx 30 minutes for this patient encounter including review of prior medical records with greater than 50% of time being face to face and coordination of their care.  Electronically signed by:   Odette Fraction, MD Infectious Disease Physician [facility] Infect[facility] Pager: [phone]

## 2020-04-25 NOTE — Progress Notes (Signed)
NAME:  Zachary Woods, MRN:  [id number], DOB:  14-Sep-1965, LOS: 5 ADMISSION DATE:  05/08/20, CHIEF COMPLAINT: AMS in the setting of acute stroke   Brief History:  55 year old male who was admitted on 2/18 with left MCA infarction and small frontal SAH. Required intubation for progressive AMS.  History of Present Illness:  Zachary Woods is a 55 year old male with pertinent PMH of HTN, CVA, PFO s/p closure in 2018, DM 2, and GERD who presented to the ED with right sided weakness. He was last in his normal state of health the day prior. Overnight, the patient got up to go to the bathroom and could not move his right side and fell to the ground.   In ED, CT head showed small volume SAH. CTA/CTP demonstrated moderate sized left MCA territory ischemic area.   Patient's AMS continued to progress and needed to be intubated. PCCM was subsequently consulted and patient was transferred/ admitted to Neuro CCU. Per note, neurology is worried about septic emboli and recommend a TEE looking for endocarditis and vegetative valvular lesions. Empiric Vanc/ Cefepime.   Past Medical History:  has Hypertension; GERD (gastroesophageal reflux disease); Malignant hypertension; Cerebral embolism with cerebral infarction; Stroke determined by clinical assessment (HCC); PFO (patent foramen ovale); Acute CVA (cerebrovascular accident) (HCC); Subarachnoid hemorrhage (HCC); Type 2 diabetes mellitus with hyperosmolar hyperglycemic state (HHS) (HCC); AKI (acute kidney injury) (HCC); SIRS (systemic inflammatory response syndrome) (HCC); Metabolic acidosis with increased anion gap and accumulation of organic acids; Hypomagnesemia; Hypokalemia; Thrombocytopenia (HCC); and Stroke (HCC) on their problem list.   Significant Hospital Events:  2/18 > admit, intubated  Consults:  Cardiology, Cardio-Thoracic and Neurology   Procedures:    Significant Diagnostic Tests:  CT / CTA / CTP head and neck 2/18 > small volume right  frontal SAH, left MCA occlusion, ischemic area of 37cc with core infarct 21cc, 60% prox left ICA stenosis. MRI brain 2/18 > mod to large acute left MCA infarct, 1.8cm acute cortical / subcortical left occipital lobe infarct, small volume right frontal lobe SAH. Echo 2/18 > EF 60 - 65%, no source of embolism detected. LE duplex 2/18 > neg. Carotid US 2/18 > 1 - 39% stenosis left ICA. EEG 2/18 > mod to diffuse encephalopathy without seizures or epileptiform discharges. Chest x-ray 2/19-poor inspiratory effort but otherwise normal TEE 2/22 >  Micro Data:  COVID 2/18 > neg. Flu 2/18 > neg. Blood cultures 2/18- MSSA positive. 2/19 negative.   Antimicrobials:  Naficillin Interim History / Subjective:  Patient remains sedated and does not respond to voice or painful stimuli. Having some active seizures, propofol titrated to extinguish seizures.  Objective   Blood pressure 110/67, pulse (!) 135, temperature 97.6 F (36.4 C), temperature source Axillary, resp. rate (!) 24, height 5\' 6"  (1.676 m), weight 87.6 kg, SpO2 100 %.    Vent Mode: PSV;CPAP FiO2 (%):  [30 %] 30 % Set Rate:  [18 bmp] 18 bmp Vt Set:  [510 mL] 510 mL PEEP:  [5 cmH20] 5 cmH20 Pressure Support:  [8 cmH20] 8 cmH20 Plateau Pressure:  [8 cmH20-14 cmH20] 8 cmH20   Intake/Output Summary (Last 24 hours) at 04/25/2020 1353 Last data filed at 04/25/2020 1148 Gross per 24 hour  Intake 2488.36 ml  Output 2150 ml  Net 338.36 ml   Filed Weights   04/21/20 0342 04/22/20 0400 04/25/20 0500  Weight: 82.4 kg 87.6 kg 87.6 kg    Examination: General: Sedated. Unresponsive to voice.  HENT:  Atraumatic. Eye constricted and equal. Non-reactive to light.  Neck: slightly rigid on examination Lungs: Clear to auscultation. Cardiovascular: Tachycardic, but normal rhythm. No M/R/G  Extremities: warm/dry, no edema.  Neuro: No response to voice or painful stimuli. CNII/III: Pupils constricted without reactivity to light. Doll's eyes  negative. CN V/VII: Corneal reflex normal on left eye, but not on right. CN X: cough reflex intact Skin: Positive for janeway lesions on UE bilaterally and left LE. Improving spider nevi throughout chest, back, and lower extremities GU: Catheter in place   Resolved Hospital Problem list     Assessment & Plan:  Critically ill due to Left MCA stroke with left MCA branch occlusion with small right frontal subarachnoid. Multifactorial encephalopathy - Consider decreasing Keppra to 500mg  IV Q12h due to AKI - Continue Valproic acid 500mg  TID  - Continuous EEG monitoring  - SAT and SBT  - Wean off Propofol   Infective endocarditis: MSSA bacteremia, Bacterial Meningitis- possible skin source, sepsis: Improving - Continue Naficillin as per ID recs for better CNS coverage - Continue Rifampin per ID for PVE  - d/c Gentamicin considering AKI   Atrial Fibrillation:  - Add continuous Amiodarone 60mg /hr IV   Uncontrolled type 2 diabetes; Stable  - Continue TF  - Continue Novolog for TF coverage after TEE - Increase to 20 units Lantus BID   Hypotensinon: Stable  -  Titrate Levophed as needed. Goal SBP > 100 and MAP > 65  Hypokalemia: Resolved  - Monitor BMP   Constipation- No BM since 2/19: Improved - Continue bowel regimen of Colace, Miralax and Senna   Best practice (evaluated daily)  Pain/Anxiety/Delirium protocol (if indicated): None Neuro vitals: every 2 hours AED's: Keppra and Valproic Acid  VAP protocol (if indicated): Intubated, maintain VAP protocols Respiratory support goals: Titrate oxygen to keep sats greater than 88% Blood pressure target: Keep systolic above 100 DVT prophylaxis: Heparin 3 times daily Nutrition Status:  HOLD for TEE GI prophylaxis: Not indicated Fluid status goals: Allow autoregulation at this time Urinary catheter: Use external catheter Central lines: No central line Glucose control: Type 2 diabetes acceptable control Mobility/therapy needs:  Bedrest Antibiotic de-escalation: Naficillin Home medication reconciliation: On hold Daily labs: CBC BMP Code Status: Full code Family Communication: Wife updated at bedside 2/22 Disposition: ICU  Goals of Care:  Last date of multidisciplinary goals of care discussion:Pending  Family and staff present: Pending  Summary of discussion: Pending  Follow up goals of care discussion due: Pending  Code Status: Full  Labs   CBC: Recent Labs  Lab 05-17-20 0416 2020-05-17 0424 05/17/20 1714 04/21/20 0423 04/22/20 0345 04/22/20 1706 04/13/2020 0405 04/24/20 0556 04/25/20 0521  WBC 6.7  --  12.0* 10.8* 9.8  --  7.3 7.6 7.5  NEUTROABS 6.1  --  11.0*  --   --   --   --   --   --   HGB 15.4   < > 13.5 13.4 12.6* 11.6* 11.5* 10.1* 10.8*  HCT 42.0   < > 37.9* 37.2* 35.1* 34.0* 33.6* 28.2* 30.7*  MCV 93.5  --  94.0 94.2 94.4  --  97.4 97.6 97.2  PLT 74*  --  62* 45* 47*  --  53* 63* 76*   < > = values in this interval not displayed.    Basic Metabolic Panel: Recent Labs  Lab 04/21/20 0423 04/21/20 2141 04/22/20 0345 04/22/20 1604 04/22/20 1706 04/25/2020 0405 04/14/2020 1630 04/24/20 0556 04/25/20 0521  NA 136  --  140  --  145 145  --  148* 149*  K 3.4*  --  3.8  --  4.0 3.8  --  3.4* 3.8  CL 108  --  111  --   --  116*  --  111 113*  CO2 15*  --  15*  --   --  17*  --  22 19*  GLUCOSE 153*  --  173*  --   --  289*  --  274* 343*  BUN 23*  --  35*  --   --  66*  --  85* 127*  CREATININE 1.24  --  1.19  --   --  1.62*  --  1.92* 2.93*  CALCIUM 7.7*  --  7.9*  --   --  7.8*  --  7.5* 7.4*  MG 2.1  --   --  2.6*  --  2.6* 2.8* 2.8*  --   PHOS 1.6* 2.6  --  3.4  --  2.2* 3.1 5.5*  --    GFR: Estimated Creatinine Clearance: 29.9 mL/min (A) (by C-G formula based on SCr of 2.93 mg/dL (H)). Recent Labs  Lab 04/27/2020 1714 04/11/2020 2112 04/21/20 0423 04/21/20 1815 04/22/20 0345 04/22/20 1112 2020-05-15 0405 04/24/20 0556 04/25/20 0521  PROCALCITON 34.38  --   --   --   --   --    --   --   --   WBC 12.0*  --    < >  --  9.8  --  7.3 7.6 7.5  LATICACIDVEN 5.8* 4.5*  --  4.1*  --  2.9*  --   --   --    < > = values in this interval not displayed.    Liver Function Tests: Recent Labs  Lab 04/18/2020 0416  AST 40  ALT 23  ALKPHOS 127*  BILITOT 1.7*  PROT 6.2*  ALBUMIN 2.8*   No results for input(s): LIPASE, AMYLASE in the last 168 hours. No results for input(s): AMMONIA in the last 168 hours.  ABG    Component Value Date/Time   PHART 7.265 (L) 04/22/2020 1706   PCO2ART 42.3 04/22/2020 1706   PO2ART 292 (H) 04/22/2020 1706   HCO3 19.1 (L) 04/22/2020 1706   TCO2 20 (L) 04/22/2020 1706   ACIDBASEDEF 7.0 (H) 04/22/2020 1706   O2SAT 100.0 04/22/2020 1706     Coagulation Profile: Recent Labs  Lab 04/07/2020 0416  INR 1.3*    Cardiac Enzymes: No results for input(s): CKTOTAL, CKMB, CKMBINDEX, TROPONINI in the last 168 hours.  HbA1C: Hgb A1c MFr Bld  Date/Time Value Ref Range Status  04/29/2020 12:17 PM 10.5 (H) 4.8 - 5.6 % Final    Comment:    (NOTE) Pre diabetes:          5.7%-6.4%  Diabetes:              >6.4%  Glycemic control for   <7.0% adults with diabetes   08/04/2019 05:25 PM 8.2 (H) 4.8 - 5.6 % Final    Comment:             Prediabetes: 5.7 - 6.4          Diabetes: >6.4          Glycemic control for adults with diabetes: <7.0     CBG: Recent Labs  Lab 04/24/20 1932 04/24/20 2312 04/25/20 0327 04/25/20 0751 04/25/20 1124  GLUCAP 211* 254* 281* 303* 344*    Past Medical History:  He,  has a past medical history of Acid reflux, CVA (cerebral vascular accident) (HCC), Diabetes mellitus, type 2 (HCC), and Hypertension.   Surgical History:   Past Surgical History:  Procedure Laterality Date  . PATENT FORAMEN OVALE(PFO) CLOSURE N/A 08/01/2016   Procedure: Patent Forament [place](PFO) Closure;  Surgeon: Tonny Bollmanooper, Michael, MD;  [facility];  Service: Cardiovascular;  Laterality: N/A;  . TEE WITHOUT CARDIOVERSION  N/A 07/21/2016   Procedure: TRANSESOPHAGEAL ECHOCARDIOGRAM (TEE);  Surgeon: Lars MassonNelson, Katarina H, MD;  [facility];  Service: Cardiovascular;  Laterality: N/A;     Social History:   reports that he has quit smoking. He has never used smokeless tobacco. He reports current alcohol use of about 1.0 standard drink of alcohol per week. He reports that he does not use drugs.   Family History:  His family history includes Diabetes in his brother and father.   Allergies No Active Allergies   Home Medications  Prior to Admission medications   Medication Sig Start Date End Date Taking? Authorizing Provider  amLODipine (NORVASC) 10 MG tablet Take 1 tablet (10 mg total) by mouth daily. 02/04/18  Yes Shade FloodGreene, Jeffrey R, MD  aspirin EC 81 MG tablet Take 1 tablet (81 mg total) by mouth daily. 08/05/17  Yes Janetta Horahompson, Kathryn R, PA-C  ibuprofen (ADVIL) 200 MG tablet Take 400 mg by mouth every 6 (six) hours as needed for headache or mild pain.   Yes [provider]  meloxicam (MOBIC) 7.5 MG tablet Take 7.5 mg by mouth daily. 06/17/19  Yes [provider]  metFORMIN (GLUCOPHAGE) 500 MG tablet Take 1 tablet (500 mg total) by mouth daily with breakfast. 08/04/19  Yes Shade FloodGreene, Jeffrey R, MD  Multiple Vitamin (MULTIVITAMIN WITH MINERALS) TABS tablet Take 1 tablet by mouth daily.   Yes [provider]  omeprazole (PRILOSEC OTC) 20 MG tablet Take 20 mg by mouth daily.   Yes [provider]  pantoprazole (PROTONIX) 40 MG tablet TAKE 1 TABLET BY MOUTH EVERY DAY Patient taking differently: Take 40 mg by mouth daily. 03/25/17  Yes Tonny Bollmanooper, Michael, MD  sildenafil (VIAGRA) 100 MG tablet Take 0.5-1 tablets (50-100 mg total) by mouth daily as needed for erectile dysfunction. 11/27/16  Yes Shade FloodGreene, Jeffrey R, MD  Tetrahydrozoline HCl (VISINE OP) Place 1 drop into both eyes daily as needed (dry eyes).   Yes [provider]  hydrochlorothiazide (MICROZIDE) 12.5 MG capsule TAKE 1  CAPSULE BY MOUTH EVERY DAY Patient not taking: No sig reported 06/23/18   Shade FloodGreene, Jeffrey R, MD     Critical care time:       Andreas NewportHenry Turcios, MS4

## 2020-04-25 NOTE — Progress Notes (Signed)
eLink Physician-Brief Progress Note Patient Name: Zachary Woods DOB: 05/05/1965 MRN: [id number]   Date of Service  04/25/2020  HPI/Events of Note  Afib with RVR despite a bolus of 150 mg of Amiodarone followed by Amiodarone infusion at 30 mg  / hour started at 6 pm tonight (3 hours ago)  eICU Interventions  Will order a second 150 mg iv bolus of Amiodarone and continue the infusion.        Migdalia Dk 04/25/2020, 8:56 PM

## 2020-04-25 NOTE — Consult Note (Signed)
[name] [address]             [id number]                    Zachary Woods Medical Record #[id number] Date of Birth: [birth year]  Referring: No ref. provider found Primary Care: Wendie Agreste, MD Primary Cardiologist: No primary care provider on file.  Chief Complaint:    Chief Complaint  Patient presents with  . Code Stroke    History of Present Illness:    Zachary Woods 55 y.o. male admitted following a cerebrovascular event.  He has a history of hypertension, diabetes mellitus, cerebrovascular event, and he previously underwent a percutaneous PFO closure in 2018.  Imaging demonstrated small subarachnoid hemorrhage as well as a moderate sized left MCA ischemic stroke.  Shortly after his admission, he became altered which required intubation.  On physical exam he was noted to have lesions concerning for septic emboli from endocarditis, and blood cultures were positive for staph aureus.  He subsequently underwent a TEE which showed 2 cm vegetation attached to the PFO closure device, and this likely was the source of his embolic stroke as well as peripheral lesions.  CTS was consulted to assist with management.  Past Medical History:  Diagnosis Date  . Acid reflux   . CVA (cerebral vascular accident) ([place])   . Diabetes mellitus, type 2 ([place])   . Hypertension     Past Surgical History:  Procedure Laterality Date  . PATENT FORAMEN [place](PFO) CLOSURE N/A 08/01/2016   Procedure: Patent [place](PFO) Closure;  Surgeon: Sherren Mocha, MD;  Location: [facility];  Service: Cardiovascular;  Laterality: N/A;  . TEE WITHOUT CARDIOVERSION N/A 07/21/2016   Procedure: TRANSESOPHAGEAL ECHOCARDIOGRAM (TEE);  Surgeon: Dorothy Spark, MD;  Location: [facility];  Service: Cardiovascular;  Laterality: N/A;    Family History  Problem Relation Age of Onset  . Diabetes Father   . Diabetes Brother      Social History    Tobacco Use  Smoking Status Former Smoker  Smokeless Tobacco Never Used    Social History   Substance and Sexual Activity  Alcohol Use Yes  . Alcohol/week: 1.0 standard drink  . Types: 1 Shots of liquor per week   Comment: 10-12/ week-- (2-4 drinks/ day)     No Active Allergies  Current Facility-Administered Medications  Medication Dose Route Frequency Provider Last Rate Last Admin  . 0.9 %  sodium chloride infusion   Intravenous PRN Kipp Brood, MD 10 mL/hr at 04/25/20 0800 Infusion Verify at 04/25/20 0800  . acetaminophen (TYLENOL) tablet 650 mg  650 mg Oral Q4H PRN Fuller Plan A, MD       Or  . acetaminophen (TYLENOL) 160 MG/5ML solution 650 mg  650 mg Per Tube Q4H PRN Fuller Plan A, MD   650 mg at 04/25/20 0453   Or  . acetaminophen (TYLENOL) suppository 650 mg  650 mg Rectal Q4H PRN Fuller Plan A, MD   650 mg at 04/22/20 1137  . albuterol (PROVENTIL) (2.5 MG/3ML) 0.083% nebulizer solution 2.5 mg  2.5 mg Nebulization Q2H PRN Agarwala, Einar Grad, MD      . chlorhexidine gluconate (MEDLINE KIT) (PERIDEX) 0.12 % solution 15 mL  15 mL Mouth Rinse BID Agarwala, Ravi, MD   15 mL at 04/25/20 0800  . Chlorhexidine Gluconate Cloth 2 % PADS 6 each  6 each Topical Q0600 Kipp Brood, MD   6 each at 04/25/20 0453  . diphenhydrAMINE (BENADRYL) injection 25 mg  25 mg Intravenous Once PRN [place] Callas, NP      . docusate (COLACE) 50 MG/5ML liquid 100 mg  100 mg Per Tube BID PRN Kipp Brood, MD      . feeding supplement (PROSource TF) liquid 45 mL  45 mL Per Tube TID Kipp Brood, MD   45 mL at 04/25/20 1016  . feeding supplement (VITAL 1.5 CAL) liquid 1,000 mL  1,000 mL Per Tube Continuous Agarwala, Einar Grad, MD 60 mL/hr at 04/24/20 1610 1,000 mL at 04/24/20 1610  . fentaNYL (SUBLIMAZE) injection 50 mcg  50 mcg Intravenous Q15 min PRN Kipp Brood, MD   50 mcg at 04/24/20 1234  . fentaNYL (SUBLIMAZE) injection 50-200 mcg  50-200 mcg Intravenous Q30 min PRN Kipp Brood,  MD   50 mcg at 04/24/20 1237  . folic acid (FOLVITE) tablet 1 mg  1 mg Per Tube Daily Agarwala, Ravi, MD   1 mg at 04/25/20 1017  . free water 200 mL  200 mL Per Tube Q4H Agarwala, Einar Grad, MD      . heparin injection 5,000 Units  5,000 Units Subcutaneous Q8H Kipp Brood, MD   5,000 Units at 04/25/20 0503  . insulin aspart (novoLOG) injection 0-15 Units  0-15 Units Subcutaneous Q4H Chesley Mires, MD   11 Units at 04/25/20 [phone]  . insulin aspart (novoLOG) injection 6 Units  6 Units Subcutaneous Q4H Agarwala, Ravi, MD      . insulin glargine (LANTUS) injection 20 Units  20 Units Subcutaneous BID Agarwala, Ravi, MD      . levETIRAcetam (KEPPRA) IVPB 1000 mg/100 mL premix  1,000 mg Intravenous Q12H Kipp Brood, MD   Paused at 04/25/20 9833  . MEDLINE mouth rinse  15 mL Mouth Rinse 10 times per day Kipp Brood, MD   15 mL at 04/25/20 1017  . metoprolol tartrate (LOPRESSOR) injection 5 mg  5 mg Intravenous Q5 min PRN Kipp Brood, MD   5 mg at 04/25/20 0856  . multivitamin with minerals tablet 1 tablet  1 tablet Per Tube Daily Agarwala, Einar Grad, MD   1 tablet at 04/25/20 1017  . nafcillin 12 g in sodium chloride 0.9 % 500 mL continuous infusion  12 g Intravenous Q24H Susa Raring, RPH 20.8 mL/hr at 04/25/20 0800 Infusion Verify at 04/25/20 0800  . norepinephrine (LEVOPHED) 52m in 257mpremix infusion  0-10 mcg/min Intravenous Titrated AgKipp BroodMD   Stopped at 04/24/20 1852  . ondansetron (ZOFRAN) injection 4 mg  4 mg Intravenous Q6H PRN GrMagdalen SpatzNP      . pantoprazole sodium (PROTONIX) 40 mg/20 mL oral suspension 40 mg  40 mg Per Tube Daily AgKipp BroodMD   40 mg at 04/25/20 1017  . polyethylene glycol (MIRALAX / GLYCOLAX) packet 17 g  17 g Per Tube Daily PRN Agarwala, Ravi, MD      . propofol (DIPRIVAN) 1000 MG/100ML infusion  5-80 mcg/kg/min Intravenous Titrated AgKipp BroodMD   Paused at 04/25/20 0757  . rifampin (RIFADIN) 300 mg in sodium chloride 0.9 % 100 mL IVPB  300  mg Intravenous Q8H SnCarlyle BasquesMD   Stopped at 04/25/20 0645  . sodium chloride flush (NS) 0.9 % injection 3 mL  3 mL Intravenous Once Smith, Rondell A, MD      . thiamine tablet 100 mg  100 mg Per Tube Daily Agarwala,  Einar Grad, MD   100 mg at 04/25/20 1017  . valproic acid (DEPAKENE) 250 MG/5ML solution 500 mg  500 mg Per Tube TID Kipp Brood, MD   500 mg at 04/25/20 1016    Review of Systems  Unable to perform ROS: Intubated    PHYSICAL EXAMINATION: BP 96/62   Pulse (!) 129   Temp (!) 97.5 F (36.4 C) (Axillary)   Resp (!) 23   Ht _0  (1.676 m)   Wt 87.6 kg   SpO2 100%   BMI 31.17 kg/m   Physical Exam Constitutional:      Comments: Sedated  Cardiovascular:     Rate and Rhythm: Tachycardia present.  Pulmonary:     Comments: Vent Mode: PSV;CPAP FiO2 (%):  (30 %) 30 % Set Rate:  (18 bmp) 18 bmp Vt Set:  (510 mL) 510 mL PEEP:  (5 cmH20) 5 cmH20 Pressure Support:  (8 cmH20) 8 cmH20 Plateau Pressure:  (8 cmH20-21 cmH20) 8 cmH20  Abdominal:     General: Abdomen is flat. There is no distension.  Skin:    General: Skin is warm.     Comments: Multiple [place] lesions on upper and lower extremities.      Diagnostic Studies & Laboratory data:     Recent Radiology Findings:   EEG  Result Date: 04/22/2020 Lora Havens, MD     04/22/2020  2:35 PM Patient Name: Zachary Woods MRN: [id number] Epilepsy Attending: Lora Havens Referring Physician/Provider: Dr Kipp Brood Date: 04/22/2020 Duration: 22.29 mins Patient history: 55 y.o.malewith history of previous cryptogenic Left MCA stroke s/p PFO closure, lost to stroke follow up 2018, DM2, HTN, GERDpresenting withaphasia, right facial droop and right sided weakness noted upon waking up at 0300 this morning. EEG to evaluate for seizure Level of alertness: lethargic AEDs during EEG study: None Technical aspects: This EEG study was done with scalp electrodes positioned according to the 10-20 International system of  electrode placement. Electrical activity was acquired at a sampling rate of _1  and reviewed with a high frequency filter of _2  and a low frequency filter of _3 . EEG data were recorded continuously and digitally stored. Description: No posterior dominant rhythm was seen. EEG showed continuous generalized 3 to 6 Hz theta-delta slowing. Abundant ( one every 2-7 seconds) spikes were seen in left temporal region.  Hyperventilation and photic stimulation were not performed.   ABNORMALITY -Continuous slow, generalized -Spike, left temporal region. IMPRESSION: This study showed evidence of potential epileptogenicity arising  From left temporal region as well as moderate diffuse encephalopathy, nonspecific etiology. No seizures were seen throughout the recording. Dr Lynetta Mare was notified Lora Havens   CT Code Stroke CTA Head W/WO contrast  Result Date: 04/09/2020 CLINICAL DATA:  Stroke suspected.  Weakness and fall EXAM: CT ANGIOGRAPHY HEAD AND NECK CT PERFUSION BRAIN TECHNIQUE: Multidetector CT imaging of the head and neck was performed using the standard protocol during bolus administration of intravenous contrast. Multiplanar CT image reconstructions and MIPs were obtained to evaluate the vascular anatomy. Carotid stenosis measurements (when applicable) are obtained utilizing NASCET criteria, using the distal internal carotid diameter as the denominator. Multiphase CT imaging of the brain was performed following IV bolus contrast injection. Subsequent parametric perfusion maps were calculated using RAPID software. CONTRAST:  Dose is currently not known COMPARISON:  Head CT from earlier today. Brain MRI and MRA 07/19/2016 FINDINGS: CTA NECK FINDINGS Aortic arch: Mild atheromatous plaque with 3 vessel branching. Right carotid system: Atheromatous plaque which is  primarily calcified and at the proximal ICA. No flow limiting stenosis or ulceration. Mildly medial and retropharyngeal course the ICA. Left carotid  system: Atheromatous plaque which is greater than the right, primarily seen at the distal common carotid to proximal ICA with mixed density irregular plaque at the bifurcation. Proximal left ICA stenosis measures 60%. No dissection or generalized beading Vertebral arteries: Subclavian plaque bilaterally, greater on the right. Atheromatous plaque at both vertebral ostia. The vertebral arteries are mildly narrowed at the origins but are otherwise smooth and widely patent to the dura. Skeleton: No acute finding Other neck: Negative Upper chest: No acute finding Review of the MIP images confirms the above findings CTA HEAD FINDINGS Anterior circulation: Atheromatous plaque along both carotid siphons. Near trifurcation of the left MCA. The middle branch is rapidly attenuated, a new finding from 2018. Negative for aneurysm or generalized beading. Posterior circulation: Small appearing vessels symmetric to the anterior circulation. The vertebral and basilar arteries are smooth and widely patent. No cerebellar or PCA branch occlusion is seen. Negative for aneurysm or beading Venous sinuses: Negative in the arterial phase. Anatomic variants: None significant Review of the MIP images confirms the above findings CT Brain Perfusion Findings: ASPECTS: 10 CBF (<30%) Volume: 59m Perfusion (Tmax>6.0s) volume: 349mMismatch Volume: 1615mnfarction Location:Left parietal Findings discussed with Dr. KirLeonel Ramsayho is already aware. IMPRESSION: 1. Left MCA branch occlusion when compared with 2018. Although at the M2 level, the vessel is smaller than the other M2 vessels. CT perfusion shows an ischemic area of 37 cc with core infarct of 21 cc. 2. Premature atherosclerosis for age. Most notably is a 60% proximal left ICA stenosis due to irregular plaque. Electronically Signed   By: JonMonte FantasiaD.   On: 04/05/2020 04:45   CT HEAD WO CONTRAST  Result Date: 04/22/2020 CLINICAL DATA:  Stroke follow-up EXAM: CT HEAD WITHOUT  CONTRAST TECHNIQUE: Contiguous axial images were obtained from the base of the skull through the vertex without intravenous contrast. COMPARISON:  April 21, 2020 FINDINGS: Brain: The large left MCA territory infarct is not significantly changed in size in the interval. Wispy high attenuation within the infarct is similar in the interval and could represent a small amount of associated blood products. A small left occipital infarct is stable. No midline shift identified. The cerebellum, brainstem, and basal cisterns are stable and unremarkable. Ventricles and sulci are stable. The subarachnoid hemorrhage adjacent to the frontal lobe on the right is stable. No other interval changes. Vascular: Calcified atherosclerosis in the intracranial carotids. Skull: Normal. Negative for fracture or focal lesion. Sinuses/Orbits: No acute finding. Other: None. IMPRESSION: 1. Non progressed left cerebral infarcts and right frontal subarachnoid hemorrhage. No significant interval change. Electronically Signed   By: DavDorise BullionI M.D   On: 04/22/2020 12:31   CT HEAD WO CONTRAST  Result Date: 04/21/2020 CLINICAL DATA:  Follow-up stroke EXAM: CT HEAD WITHOUT CONTRAST TECHNIQUE: Contiguous axial images were obtained from the base of the skull through the vertex without intravenous contrast. COMPARISON:  Yesterday at 5:30 p.m. FINDINGS: Brain: Large left MCA territory infarction primarily affecting the posterior division. Small left occipital cortex infarct. Non progressed right frontal subarachnoid hemorrhage when compared to most recent prior. No hydrocephalus or midline shift. Vascular: No hyperdense vessel. Skull: Normal. Negative for fracture or focal lesion. Sinuses/Orbits: No acute finding. IMPRESSION: Non progressed left cerebral infarcts and right frontal subarachnoid hemorrhage. Electronically Signed   By: JonMonte FantasiaD.   On: 04/21/2020 05:23  CT HEAD WO CONTRAST  Result Date: 04/28/2020 CLINICAL DATA:   Left MCA and left PCA territory infarcts, subarachnoid hemorrhage EXAM: CT HEAD WITHOUT CONTRAST TECHNIQUE: Contiguous axial images were obtained from the base of the skull through the vertex without intravenous contrast. COMPARISON:  04/15/2020 FINDINGS: Brain: Evaluation is limited by patient motion during the exam. Slight increase in the subarachnoid hemorrhage along the right frontal lobe, now measuring 17 x 5 mm where previously the hemorrhage had measured 4 x 9 mm. No mass effect. Expected evolution of the large left MCA territory infarct, with loss of gray-white differentiation and overlying sulcal effacement. Progression of the left occipital lobe infarct seen on prior MRI as well, with loss of gray-white differentiation. There is effacement along the sylvian fissure without midline shift or significant mass effect. No evidence of hemorrhage within the infarct beds. The lateral ventricles and midline structures are unremarkable. Vascular: No hyperdense vessel or unexpected calcification. Skull: Normal. Negative for fracture or focal lesion. Sinuses/Orbits: No acute finding. Other: None. IMPRESSION: 1. Slightly limited evaluation due to patient motion. 2. Minimal increase in the amount of right frontal subarachnoid hemorrhage, which may be overestimated due to motion in this region during the exam. No surrounding mass effect. 3. Evolution of the left MCA and PCA territory infarcts, with loss of gray-white differentiation and sulcal effacement. No midline shift. No hemorrhagic transformation. Electronically Signed   By: Randa Ngo M.D.   On: 04/03/2020 17:36   CT Code Stroke CTA Neck W/WO contrast  Result Date: 04/28/2020 CLINICAL DATA:  Stroke suspected.  Weakness and fall EXAM: CT ANGIOGRAPHY HEAD AND NECK CT PERFUSION BRAIN TECHNIQUE: Multidetector CT imaging of the head and neck was performed using the standard protocol during bolus administration of intravenous contrast. Multiplanar CT image  reconstructions and MIPs were obtained to evaluate the vascular anatomy. Carotid stenosis measurements (when applicable) are obtained utilizing NASCET criteria, using the distal internal carotid diameter as the denominator. Multiphase CT imaging of the brain was performed following IV bolus contrast injection. Subsequent parametric perfusion maps were calculated using RAPID software. CONTRAST:  Dose is currently not known COMPARISON:  Head CT from earlier today. Brain MRI and MRA 07/19/2016 FINDINGS: CTA NECK FINDINGS Aortic arch: Mild atheromatous plaque with 3 vessel branching. Right carotid system: Atheromatous plaque which is primarily calcified and at the proximal ICA. No flow limiting stenosis or ulceration. Mildly medial and retropharyngeal course the ICA. Left carotid system: Atheromatous plaque which is greater than the right, primarily seen at the distal common carotid to proximal ICA with mixed density irregular plaque at the bifurcation. Proximal left ICA stenosis measures 60%. No dissection or generalized beading Vertebral arteries: Subclavian plaque bilaterally, greater on the right. Atheromatous plaque at both vertebral ostia. The vertebral arteries are mildly narrowed at the origins but are otherwise smooth and widely patent to the dura. Skeleton: No acute finding Other neck: Negative Upper chest: No acute finding Review of the MIP images confirms the above findings CTA HEAD FINDINGS Anterior circulation: Atheromatous plaque along both carotid siphons. Near trifurcation of the left MCA. The middle branch is rapidly attenuated, a new finding from 2018. Negative for aneurysm or generalized beading. Posterior circulation: Small appearing vessels symmetric to the anterior circulation. The vertebral and basilar arteries are smooth and widely patent. No cerebellar or PCA branch occlusion is seen. Negative for aneurysm or beading Venous sinuses: Negative in the arterial phase. Anatomic variants: None  significant Review of the MIP images confirms the above findings CT  Brain Perfusion Findings: ASPECTS: 10 CBF (<30%) Volume: 33m Perfusion (Tmax>6.0s) volume: 350mMismatch Volume: 1677mnfarction Location:Left parietal Findings discussed with Dr. KirLeonel Ramsayho is already aware. IMPRESSION: 1. Left MCA branch occlusion when compared with 2018. Although at the M2 level, the vessel is smaller than the other M2 vessels. CT perfusion shows an ischemic area of 37 cc with core infarct of 21 cc. 2. Premature atherosclerosis for age. Most notably is a 60% proximal left ICA stenosis due to irregular plaque. Electronically Signed   By: JonMonte FantasiaD.   On: 04/03/2020 04:45   MR BRAIN WO CONTRAST  Result Date: 04/03/2020 CLINICAL DATA:  Neuro deficit, acute, stroke suspected. EXAM: MRI HEAD WITHOUT CONTRAST TECHNIQUE: Multiplanar, multiecho pulse sequences of the brain and surrounding structures were obtained without intravenous contrast. COMPARISON:  Noncontrast head CT, CT angiogram head/neck and CT perfusion 04/14/2020. FINDINGS: Brain: Due to patient disorientation and significant motion degradation, the examination was prematurely terminated and axial and coronal T1 weighted sequences were not obtained. The acquired sequences are motion degraded. Most notably, there is moderate motion degradation of the diffusion-weighted sequences, moderate motion degradation of the axial T2 weighted and T2/FLAIR sequences, moderate motion degradation of the axial SWI sequence and severe motion degradation of the coronal T2 weighted sequence. Cerebral volume is normal. There is a moderate to large acute cortical/subcortical left MCA territory infarct affecting the left frontal and parietal lobes as well as left insula/subinsular region. No significant mass effect at this time. No hemorrhagic conversion. 1.8 cm acute cortical/subcortical infarct within the left occipital lobe (PCA vascular territory). A few scattered  punctate acute infarcts are also questioned within the right frontoparietal white matter (versus artifact) Redemonstrated small volume acute subarachnoid hemorrhage along the anterior right frontal lobe. No significant white matter disease. No intracranial mass is identified. No midline shift Vascular: Left M2 branch occlusion was identified on the CTA head performed earlier today. Otherwise, there are expected proximal arterial flow voids. Skull and upper cervical spine: No evidence of focal marrow lesion on the acquired sequences. Sinuses/Orbits: Small left maxillary sinus mucous retention cyst. IMPRESSION: Motion degraded and prematurely terminated examination, as described. Moderate to large acute left MCA territory cortical/subcortical infarct affecting the left frontal and parietal lobes, as well as left insula and subinsular region. 1.8 cm acute cortical/subcortical left occipital lobe infarct (left PCA vascular territory). A few additional punctate acute infarcts are questioned within the right frontoparietal white matter (versus artifact). Redemonstrated small volume subarachnoid hemorrhage overlying the anterior right frontal lobe. Electronically Signed   By: KylKellie Simmering   On: 04/13/2020 09:37   CT C-SPINE NO CHARGE  Result Date: 04/04/2020 CLINICAL DATA:  Status post ground level fall. Right-sided facial droop, right arm/leg weakness, slurred. EXAM: CT CERVICAL SPINE WITHOUT CONTRAST TECHNIQUE: Multidetector CT imaging of the cervical spine was performed without intravenous contrast. Multiplanar CT image reconstructions were also generated. COMPARISON:  None. FINDINGS: Alignment: Normal. Skull base and vertebrae: Multilevel degenerative changes of the spine. No acute fracture. No aggressive appearing focal osseous lesion or focal pathologic process. Soft tissues and spinal canal: No prevertebral fluid or swelling. No visible canal hematoma. Upper chest: Unremarkable. Other: Please see separately  dictated CT angio head/neck. IMPRESSION: 1. No acute displaced fracture or traumatic listhesis of the cervical spine. 2. Please see separately dictated CT angio head/neck. Electronically Signed   By: MorIven FinnD.   On: 04/11/2020 05:29   CT Code Stroke Cerebral Perfusion with contrast  Result Date:  04/11/2020 CLINICAL DATA:  Stroke suspected.  Weakness and fall EXAM: CT ANGIOGRAPHY HEAD AND NECK CT PERFUSION BRAIN TECHNIQUE: Multidetector CT imaging of the head and neck was performed using the standard protocol during bolus administration of intravenous contrast. Multiplanar CT image reconstructions and MIPs were obtained to evaluate the vascular anatomy. Carotid stenosis measurements (when applicable) are obtained utilizing NASCET criteria, using the distal internal carotid diameter as the denominator. Multiphase CT imaging of the brain was performed following IV bolus contrast injection. Subsequent parametric perfusion maps were calculated using RAPID software. CONTRAST:  Dose is currently not known COMPARISON:  Head CT from earlier today. Brain MRI and MRA 07/19/2016 FINDINGS: CTA NECK FINDINGS Aortic arch: Mild atheromatous plaque with 3 vessel branching. Right carotid system: Atheromatous plaque which is primarily calcified and at the proximal ICA. No flow limiting stenosis or ulceration. Mildly medial and retropharyngeal course the ICA. Left carotid system: Atheromatous plaque which is greater than the right, primarily seen at the distal common carotid to proximal ICA with mixed density irregular plaque at the bifurcation. Proximal left ICA stenosis measures 60%. No dissection or generalized beading Vertebral arteries: Subclavian plaque bilaterally, greater on the right. Atheromatous plaque at both vertebral ostia. The vertebral arteries are mildly narrowed at the origins but are otherwise smooth and widely patent to the dura. Skeleton: No acute finding Other neck: Negative Upper chest: No acute  finding Review of the MIP images confirms the above findings CTA HEAD FINDINGS Anterior circulation: Atheromatous plaque along both carotid siphons. Near trifurcation of the left MCA. The middle branch is rapidly attenuated, a new finding from 2018. Negative for aneurysm or generalized beading. Posterior circulation: Small appearing vessels symmetric to the anterior circulation. The vertebral and basilar arteries are smooth and widely patent. No cerebellar or PCA branch occlusion is seen. Negative for aneurysm or beading Venous sinuses: Negative in the arterial phase. Anatomic variants: None significant Review of the MIP images confirms the above findings CT Brain Perfusion Findings: ASPECTS: 10 CBF (<30%) Volume: 45m Perfusion (Tmax>6.0s) volume: 347mMismatch Volume: 1644mnfarction Location:Left parietal Findings discussed with Dr. KirLeonel Ramsayho is already aware. IMPRESSION: 1. Left MCA branch occlusion when compared with 2018. Although at the M2 level, the vessel is smaller than the other M2 vessels. CT perfusion shows an ischemic area of 37 cc with core infarct of 21 cc. 2. Premature atherosclerosis for age. Most notably is a 60% proximal left ICA stenosis due to irregular plaque. Electronically Signed   By: JonMonte FantasiaD.   On: 04/17/2020 04:45   DG CHEST PORT 1 VIEW  Result Date: 04/24/2020 CLINICAL DATA:  Respiratory failure. EXAM: PORTABLE CHEST 1 VIEW COMPARISON:  April 22, 2020. FINDINGS: The heart size and mediastinal contours are within normal limits. Endotracheal and nasogastric tubes appear to be in grossly good position. No pneumothorax or pleural effusion is noted. Both lungs are clear. The visualized skeletal structures are unremarkable. IMPRESSION: No active disease. Electronically Signed   By: JamMarijo ConceptionD.   On: 04/24/2020 09:46   DG CHEST PORT 1 VIEW  Result Date: 04/22/2020 CLINICAL DATA:  [age]ar old male status post intubation. EXAM: PORTABLE CHEST 1 VIEW  COMPARISON:  Earlier radiograph dated 04/21/2020. FINDINGS: Endotracheal tube with tip approximately 4.5 cm above the carina. Enteric tube extends below the diaphragm with side-port in the left upper abdomen and tip beyond the inferior margin of the image. Patchy right lung base consolidation, new since the prior radiograph and may represent atelectasis. Infiltrate is  not excluded clinical correlation is recommended. There is mild eventration of the right hemidiaphragm. No pleural effusion or pneumothorax. Mild cardiomegaly. Amplatzer device noted. No acute osseous pathology. IMPRESSION: 1. Endotracheal tube above the carina. 2. Patchy right lung base consolidation, new since the prior radiograph and may represent atelectasis. Electronically Signed   By: Anner Crete M.D.   On: 04/22/2020 16:03   DG CHEST PORT 1 VIEW  Result Date: 04/21/2020 CLINICAL DATA:  Aspiration pneumonia. EXAM: PORTABLE CHEST 1 VIEW COMPARISON:  April 20, 2020 FINDINGS: The heart size and mediastinal contours are within normal limits. Both lungs are clear. The visualized skeletal structures are unremarkable. IMPRESSION: No active disease. Electronically Signed   By: Dorise Bullion III M.D   On: 04/21/2020 10:03   DG Chest Port 1 View  Result Date: 04/15/2020 CLINICAL DATA:  Fall, code stroke EXAM: PORTABLE CHEST 1 VIEW COMPARISON:  07/19/2016 FINDINGS: Low lung volumes. Heart and mediastinal contours are within normal limits. No focal opacities or effusions. No acute bony abnormality. IMPRESSION: Low volumes.  No active cardiopulmonary disease. Electronically Signed   By: Rolm Baptise M.D.   On: 04/13/2020 05:17   DG Abd Portable 1V  Result Date: 04/24/2020 CLINICAL DATA:  OG tube placement EXAM: PORTABLE ABDOMEN - 1 VIEW COMPARISON:  None. FINDINGS: Enteric tube passes into the stomach. Unremarkable bowel gas pattern IMPRESSION: Enteric tube within the stomach. Electronically Signed   By: Macy Mis M.D.   On:  04/24/2020 15:44   EEG adult  Result Date: 04/08/2020 Lora Havens, MD     04/22/2020  4:45 PM Patient Name: Zachary Woods MRN: [id number] Epilepsy Attending: Lora Havens Referring Physician/Provider: Charlene Brooke, NP Date: 04/11/2020 Duration: 29.18 mins Patient history: 55 y.o. male with history of previous cryptogenic Left MCA stroke s/p PFO closure, lost to stroke follow up 2018, DM2, HTN, GERD presenting with aphasia, right facial droop and right sided weakness noted upon waking up at 0300 this morning. EEG to evaluate for seizure Level of alertness: lethargic AEDs during EEG study: Technical aspects: This EEG study was done with scalp electrodes positioned according to the 10-20 International system of electrode placement. Electrical activity was acquired at a sampling rate of _0  and reviewed with a high frequency filter of _1  and a low frequency filter of _2 . EEG data were recorded continuously and digitally stored. Description: EEG showed continuous generalized 3 to 6 Hz theta-delta slowing. Hyperventilation and photic stimulation were not performed.   ABNORMALITY -Continuous slow, generalized IMPRESSION: This study is suggestive of moderate diffuse encephalopathy, nonspecific etiology. No seizures or epileptiform discharges were seen throughout the recording. Priyanka Barbra Sarks   Overnight EEG with video  Result Date: 04/26/2020 Lora Havens, MD     04/24/2020 11:01 AM Patient Name: Zachary Woods MRN: [id number] Epilepsy Attending: Lora Havens Referring Physician/Provider: Dr Kipp Brood Duration: 04/22/2020 1343 to 04/19/2020 1343  Patient history: 55 y.o.malewith history of previous cryptogenic Left MCA stroke s/p PFO closure, lost to stroke follow up 2018, DM2, HTN, GERDpresenting withaphasia, right facial droop and right sided weakness noted upon waking up at 0300 this morning.EEG to evaluate for seizure  Level of alertness: lethargic, asleep  AEDs during EEG  study: LEV  Technical aspects: This EEG study was done with scalp electrodes positioned according to the 10-20 International system of electrode placement. Electrical activity was acquired at a sampling rate of _3  and reviewed with a high frequency filter of _4  and a low frequency  filter of _0 . EEG data were recorded continuously and digitally stored.  Description: No posterior dominant rhythm was seen.  Sleep was characterized by sleep spindles (12 to 14 Hz), maximal frontocentral region. EEG showed continuous generalized 3 to 6 Hz theta-delta slowing which had triphasic morphology at times when patient was awake/stimulated. Spikes were seen in left temporal region.    After around 830 on 2/21/122, patient was noted to have seizures without clinical signs arising from left temporoparietal region, last seizure at 1341, avg duration 1.10mnutes. Hyperventilation and photic stimulation were not performed.    ABNORMALITY -Seizures without clinical signs, left temporo-parietal region -Spike, left temporal region -Continuous slow, generalized IMPRESSION: This study showed multiple seizures without clinical signs arising from left temporoparietal region, last seizure at 1341, avg duration 1.51mutes. Additionally there is moderate diffuse encephalopathy, nonspecific etiology.  PrLora Havens ECHOCARDIOGRAM COMPLETE  Result Date: 04/06/2020    ECHOCARDIOGRAM REPORT   Patient Name:   Zachary Woods Date of Exam: 04/24/2020 Medical Rec #:  [id number]    Height:       67.0 in Accession #:    [id number]   Weight:       171.5 lb Date of Birth:  [birth year]    BSA:          1.894 m Patient Age:    [age]ears       BP:           119/88 mmHg Patient Gender: M              HR:           119 bpm. Exam Location:  Inpatient Procedure: 2D Echo, Cardiac Doppler and Color Doppler Indications:    Stroke 434.91 / I163.9  History:        Patient has prior history of Echocardiogram examinations, most                 recent  02/04/2017. Stroke; Risk Factors:Diabetes and                 Hypertension. Septal Repair:25MM Amplazter on 08/01/2016.  Sonographer:    TiDarlina SicilianDCS Referring Phys: [id number] A SMITH  Sonographer Comments: Difficult study, patient is unable to remain still during echo. IMPRESSIONS  1. Septal Repair:25 mm Amplazter on 08/01/2016.  2. Left ventricular ejection fraction, by estimation, is 60 to 65%. The left ventricle has normal function. The left ventricle has no regional wall motion abnormalities. There is mild left ventricular hypertrophy. Left ventricular diastolic parameters are indeterminate.  3. Right ventricular systolic function is normal. The right ventricular size is normal.  4. The mitral valve is normal in structure. No evidence of mitral valve regurgitation. No evidence of mitral stenosis.  5. The aortic valve is normal in structure. Aortic valve regurgitation is not visualized. No aortic stenosis is present.  6. The inferior vena cava is normal in size with greater than 50% respiratory variability, suggesting right atrial pressure of 3 mmHg. Conclusion(s)/Recommendation(s): No intracardiac source of embolism detected on this transthoracic study. A transesophageal echocardiogram is recommended to exclude cardiac source of embolism if clinically indicated. FINDINGS  Left Ventricle: Left ventricular ejection fraction, by estimation, is 60 to 65%. The left ventricle has normal function. The left ventricle has no regional wall motion abnormalities. The left ventricular internal cavity size was normal in size. There is  mild left ventricular hypertrophy. Left ventricular diastolic parameters are indeterminate. Right  Ventricle: The right ventricular size is normal. No increase in right ventricular wall thickness. Right ventricular systolic function is normal. Left Atrium: Left atrial size was normal in size. Right Atrium: Right atrial size was normal in size. Pericardium: There is no evidence of  pericardial effusion. Mitral Valve: The mitral valve is normal in structure. No evidence of mitral valve regurgitation. No evidence of mitral valve stenosis. Tricuspid Valve: The tricuspid valve is normal in structure. Tricuspid valve regurgitation is not demonstrated. No evidence of tricuspid stenosis. Aortic Valve: The aortic valve is normal in structure. Aortic valve regurgitation is not visualized. No aortic stenosis is present. Pulmonic Valve: The pulmonic valve was normal in structure. Pulmonic valve regurgitation is not visualized. No evidence of pulmonic stenosis. Aorta: The aortic root is normal in size and structure. Venous: The inferior vena cava is normal in size with greater than 50% respiratory variability, suggesting right atrial pressure of 3 mmHg. IAS/Shunts: No atrial level shunt detected by color flow Doppler. Additional Comments: Septal Repair:25 mm Amplazter on 08/01/2016.  LEFT VENTRICLE PLAX 2D LVIDd:         4.50 cm LVIDs:         4.40 cm LV PW:         1.40 cm LV IVS:        1.30 cm LVOT diam:     2.20 cm LV SV:         43 LV SV Index:   22 LVOT Area:     3.80 cm  LEFT ATRIUM             Index LA diam:        3.20 cm 1.69 cm/m LA Vol (A2C):   21.0 ml 11.09 ml/m LA Vol (A4C):   18.2 ml 9.61 ml/m LA Biplane Vol: 20.5 ml 10.82 ml/m  AORTIC VALVE LVOT Vmax:   97.90 cm/s LVOT Vmean:  63.800 cm/s LVOT VTI:    0.112 m  AORTA Ao Root diam: 2.90 cm  SHUNTS Systemic VTI:  0.11 m Systemic Diam: 2.20 cm Candee Furbish MD Electronically signed by Candee Furbish MD Signature Date/Time: 04/06/2020/2:25:43 PM    Final    ECHO TEE  Result Date: 04/24/2020    TRANSESOPHOGEAL ECHO REPORT   Patient Name:   Zachary Woods Date of Exam: 04/24/2020 Medical Rec #:  [id number]      Height:       66.0 in Accession #:    [id number]     Weight:       193.1 lb Date of Birth:  02/14/1966      BSA:          1.970 m Patient Age:    38 years       BP:           95/69 mmHg Patient Gender: M              HR:           117  bpm. Exam Location:  Inpatient Procedure: Transesophageal Echo, Cardiac Doppler, Color Doppler and 3D Echo Indications:     Bacteremia  History:         Patient has prior history of Echocardiogram examinations.                  Stroke, Signs/Symptoms:Bacteremia and Fever; Risk                  Factors:Hypertension, Dyslipidemia and Diabetes. PFO closure,  seizure, MSSA.  Sonographer:     Dustin Flock Referring Phys:  [id number] Leanor Kail Diagnosing Phys: Lyman Bishop MD PROCEDURE: The transesophogeal probe was passed without difficulty through the esophogus of the patient. Sedation performed by performing physician. The patient was monitored while under deep sedation. The patient developed no complications during the procedure. IMPRESSIONS  1. Left ventricular ejection fraction, by estimation, is 60 to 65%. The left ventricle has normal function. There is mild left ventricular hypertrophy.  2. Right ventricular systolic function is normal. The right ventricular size is normal.  3. No left atrial/left atrial appendage thrombus was detected.  4. Pedunculated, extends toward the septal Amplatzer device vegetation on the mitral valve.  5. The mitral valve is abnormal. Trivial mitral valve regurgitation.  6. The aortic valve is tricuspid. Aortic valve regurgitation is not visualized.  7. Evidence of atrial level shunting detected by color flow Doppler. Agitated saline contrast did not cross right to left, due to left to right flow from ASD. Prior 24 mm Amplatzer device in place. There is a large 1 x 2 cm vegetation which is adherent to the device and appears to extend to the posterior mitral leaflet.. There is a small at the inferior rim of the Amplatzer device atrial septal defect with predominantly left to right shunting across the atrial septum. Conclusion(s)/Recommendation(s): Findings are concerning for vegetation/infective endocarditis as detailed above. Critical findings reported to Dr.  Lynetta Mare and acknowledged at 1:00 pm on 04/24/20. FINDINGS  Left Ventricle: Left ventricular ejection fraction, by estimation, is 60 to 65%. The left ventricle has normal function. The left ventricular internal cavity size was normal in size. There is mild left ventricular hypertrophy. Right Ventricle: The right ventricular size is normal. No increase in right ventricular wall thickness. Right ventricular systolic function is normal. Left Atrium: Left atrial size was normal in size. No left atrial/left atrial appendage thrombus was detected. Right Atrium: Right atrial size was normal in size. Pericardium: There is no evidence of pericardial effusion. Mitral Valve: The mitral valve is abnormal. A pedunculated, extends toward the septal Amplatzer device vegetation is seen on the posterior mitral leaflet. Trivial mitral valve regurgitation. Tricuspid Valve: The tricuspid valve is grossly normal. Tricuspid valve regurgitation is trivial. Aortic Valve: The aortic valve is tricuspid. Aortic valve regurgitation is not visualized. Pulmonic Valve: The pulmonic valve was grossly normal. Pulmonic valve regurgitation is not visualized. Aorta: The aortic root and ascending aorta are structurally normal, with no evidence of dilitation. IAS/Shunts: Evidence of atrial level shunting detected by color flow Doppler. Agitated saline contrast was given intravenously to evaluate for intracardiac shunting. Agitated saline contrast bubble study was positive with shunting observed within 3-6 cardiac cycles suggestive of interatrial shunt. There is no evidence of an atrial septal defect. There is a small at the inferior rim of the Amplatzer device atrial septal defect with predominantly left to right shunting across the atrial septum. Prior 24 mm Amplatzer device in place. There is a large 1 x 2 cm vegetation which is adherent to the device and appears to extend to the posterior mitral leaflet. Lyman Bishop MD Electronically signed by  Lyman Bishop MD Signature Date/Time: 04/24/2020/2:58:03 PM    Final    CT HEAD CODE STROKE WO CONTRAST  Result Date: 04/19/2020 CLINICAL DATA:  Code stroke.  Left-sided weakness EXAM: CT HEAD WITHOUT CONTRAST TECHNIQUE: Contiguous axial images were obtained from the base of the skull through the vertex without intravenous contrast. COMPARISON:  07/19/2016 FINDINGS: Brain: Small volume subarachnoid  hemorrhage at the right frontal pole. Reportedly there was a fall. No visible infarct, mass, or hydrocephalus. Vascular: No hyperdense vessel or unexpected calcification. Skull: Normal. Negative for fracture or focal lesion. Sinuses/Orbits: No acute finding. Other: Critical Value/emergent results were called by telephone at the time of interpretation on 04/12/2020 at 4:25 am to provider Ripley Fraise , who verbally acknowledged these results. ASPECTS [facility] Stroke Program Early CT Score) - Ganglionic level infarction (caudate, lentiform nuclei, internal capsule, insula, M1-M3 cortex): 7 - Supraganglionic infarction (M4-M6 cortex): 3 Total score (0-10 with 10 being normal): 10 IMPRESSION: 1. No visible infarct. 2. Small volume subarachnoid hemorrhage along the right frontal convexity. Electronically Signed   By: Monte Fantasia M.D.   On: 04/10/2020 04:26   VAS US CAROTID  Result Date: 04/21/2020 Carotid Arterial Duplex Study Indications:   CVA and Right side weakness, speech disturbance, and AMS. Risk Factors:  Hypertension, Diabetes, past history of smoking, prior CVA. Other Factors: S/p PFO closure - 08/2016. Performing Technologist: Rogelia Rohrer  Examination Guidelines: A complete evaluation includes B-mode imaging, spectral Doppler, color Doppler, and power Doppler as needed of all accessible portions of each vessel. Bilateral testing is considered an integral part of a complete examination. Limited examinations for reoccurring indications may be performed as noted.  Right Carotid Findings:  +----------+--------+--------+--------+------------------+------------------+           PSV cm/sEDV cm/sStenosisPlaque DescriptionComments           +----------+--------+--------+--------+------------------+------------------+ CCA Prox  77      18              heterogenous      intimal thickening +----------+--------+--------+--------+------------------+------------------+ CCA Distal43      9               heterogenous      intimal thickening +----------+--------+--------+--------+------------------+------------------+ ICA Prox  57      21              calcific                             +----------+--------+--------+--------+------------------+------------------+ ICA Distal66      28                                                   +----------+--------+--------+--------+------------------+------------------+ ECA       77      12              calcific                             +----------+--------+--------+--------+------------------+------------------+ +----------+--------+-------+--------+-------------------+           PSV cm/sEDV cmsDescribeArm Pressure (mmHG) +----------+--------+-------+--------+-------------------+ [id number]      0                                  +----------+--------+-------+--------+-------------------+ +---------+--------+--+--------+--+---------+ VertebralPSV cm/s37EDV cm/s13Antegrade +---------+--------+--+--------+--+---------+  Left Carotid Findings: +----------+-------+--------+--------+-----------------------+-----------------+           PSV    EDV cm/sStenosisPlaque Description     Comments                    cm/s                                                            +----------+-------+--------+--------+-----------------------+-----------------+  CCA Prox  73     16              heterogenous and smoothintimal                                                                   thickening         +----------+-------+--------+--------+-----------------------+-----------------+ CCA Distal68     19              heterogenous and smoothintimal                                                                   thickening        +----------+-------+--------+--------+-----------------------+-----------------+ ICA Prox  110    37      1-39%   heterogenous and                                                          calcific                                 +----------+-------+--------+--------+-----------------------+-----------------+ ICA Distal55     16                                                       +----------+-------+--------+--------+-----------------------+-----------------+ ECA       71     11                                                       +----------+-------+--------+--------+-----------------------+-----------------+ +----------+--------+--------+--------+-------------------+           PSV cm/sEDV cm/sDescribeArm Pressure (mmHG) +----------+--------+--------+--------+-------------------+ Subclavian60      0                                   +----------+--------+--------+--------+-------------------+ +---------+--------+--+--------+--+---------+ VertebralPSV cm/s40EDV cm/s18Antegrade +---------+--------+--+--------+--+---------+   Summary: Right Carotid: The extracranial vessels were near-normal with only minimal wall                thickening or plaque. Left Carotid: Velocities in the left ICA are consistent with a 1-39% stenosis. Vertebrals: Bilateral vertebral arteries demonstrate antegrade flow. *See table(s) above for measurements and observations.  Electronically signed by Jamelle Haring on 04/21/2020 at 10:58:38 AM.    Final    VAS [place] LOWER EXTREMITY VENOUS (DVT)  Result Date: 04/21/2020  Lower Venous DVT Study Indications: Stroke.  Limitations: Difficult exam  due to patient's AMS and constant movement. Comparison Study:  Previous exam 07/20/16 - negative Performing Technologist: Rogelia Rohrer  Examination Guidelines: A complete evaluation includes B-mode imaging, spectral Doppler, color Doppler, and power Doppler as needed of all accessible portions of each vessel. Bilateral testing is considered an integral part of a complete examination. Limited examinations for reoccurring indications may be performed as noted. The reflux portion of the exam is performed with the patient in reverse Trendelenburg.  +---------+---------------+---------+-----------+----------+--------------+ RIGHT    CompressibilityPhasicitySpontaneityPropertiesThrombus Aging +---------+---------------+---------+-----------+----------+--------------+ CFV      Full           Yes      Yes                                 +---------+---------------+---------+-----------+----------+--------------+ SFJ      Full                                                        +---------+---------------+---------+-----------+----------+--------------+ FV Prox  Full           Yes      Yes                                 +---------+---------------+---------+-----------+----------+--------------+ FV Mid   Full           Yes      Yes                                 +---------+---------------+---------+-----------+----------+--------------+ FV DistalFull           Yes      Yes                                 +---------+---------------+---------+-----------+----------+--------------+ PFV      Full                                                        +---------+---------------+---------+-----------+----------+--------------+ POP      Full           Yes      Yes                                 +---------+---------------+---------+-----------+----------+--------------+ PTV      Full                                                        +---------+---------------+---------+-----------+----------+--------------+ PERO     Full                                                         +---------+---------------+---------+-----------+----------+--------------+   +---------+---------------+---------+-----------+----------+--------------+  LEFT     CompressibilityPhasicitySpontaneityPropertiesThrombus Aging +---------+---------------+---------+-----------+----------+--------------+ CFV      Full           Yes      Yes                                 +---------+---------------+---------+-----------+----------+--------------+ SFJ      Full                                                        +---------+---------------+---------+-----------+----------+--------------+ FV Prox  Full           Yes      Yes                                 +---------+---------------+---------+-----------+----------+--------------+ FV Mid   Full           Yes      Yes                                 +---------+---------------+---------+-----------+----------+--------------+ FV DistalFull           Yes      Yes                                 +---------+---------------+---------+-----------+----------+--------------+ PFV      Full                                                        +---------+---------------+---------+-----------+----------+--------------+ POP      Full           Yes      Yes                                 +---------+---------------+---------+-----------+----------+--------------+ PTV      Full                                                        +---------+---------------+---------+-----------+----------+--------------+ PERO     Full                                                        +---------+---------------+---------+-----------+----------+--------------+     Summary: BILATERAL: - No evidence of deep vein thrombosis seen in the lower extremities, bilaterally. - No evidence of superficial venous thrombosis in the lower extremities, bilaterally. -No evidence of popliteal  cyst, bilaterally.   *See table(s) above for measurements and observations. Electronically signed by Jamelle Haring on 04/21/2020 at 10:59:16 AM.    Final    US Abdomen  Limited RUQ (LIVER/GB)  Result Date: 04/21/2020 CLINICAL DATA:  Elevated LFTs EXAM: ULTRASOUND ABDOMEN LIMITED RIGHT UPPER QUADRANT COMPARISON:  CT chest, abdomen and pelvis 07/20/2016 FINDINGS: Gallbladder: No gallstones or wall thickening visualized. Patient clinical status limits evaluation of a sonographic Murphy sign. Common bile duct: Diameter: 4 mm, nondilated Liver: No focal lesion identified. Previously seen focus in segment 4 on prior CT imaging is not well visualized on today's exam. Within normal limits in parenchymal echogenicity. Smooth surface contour. No intrahepatic biliary ductal dilatation. Portal vein is patent on color Doppler imaging with normal direction of blood flow towards the liver. Other: None. IMPRESSION: Unremarkable right upper quadrant ultrasound. Electronically Signed   By: Lovena Le M.D.   On: 04/21/2020 00:19       I have independently reviewed the above radiology studies  and reviewed the findings with the patient.   Recent Lab Findings: Lab Results  Component Value Date   WBC 7.5 04/25/2020   HGB 10.8 (L) 04/25/2020   HCT 30.7 (L) 04/25/2020   PLT 76 (L) 04/25/2020   GLUCOSE 343 (H) 04/25/2020   CHOL 98 04/29/2020   TRIG 187 (H) 04/24/2020   HDL <10 (L) 04/06/2020   LDLDIRECT 15.3 04/08/2020   LDLCALC NOT CALCULATED 04/22/2020   ALT 23 04/05/2020   AST 40 04/14/2020   NA 149 (H) 04/25/2020   K 3.8 04/25/2020   CL 113 (H) 04/25/2020   CREATININE 2.93 (H) 04/25/2020   BUN 127 (H) 04/25/2020   CO2 19 (L) 04/25/2020   TSH 1.620 08/25/2016   INR 1.3 (H) 04/16/2020   HGBA1C 10.5 (H) 04/26/2020        Assessment / Plan:   55 year old male with 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  He is also suffered a large embolic stroke with concern for subarachnoid hemorrhage.   Unfortunately, the embolic stroke at this point is prohibitive for any surgical repair.  If we were to proceed with systemic heparinization and removal of the PFO closure device his ischemic stroke would likely undergo hemorrhagic conversion leading to a nonsurvivable cerebrovascular complication.  Additionally his neuro status remains unclear at this point.  If he is able to be extubated and shows some degree of neuro recovery then he will require an MRA of the brain to evaluate for mycotic aneurysms before any surgical intervention with systemic heparinization can be considered.  Please call with questions. No plans for surgical intervention at this point.      Lajuana Matte 04/25/2020 10:33 AM

## 2020-04-25 NOTE — Procedures (Signed)
Cortrak  Person Inserting Tube:  King, Syrenity Klepacki E, RD Tube Type:  Cortrak - 43 inches Tube Location:  Left nare Initial Placement:  Stomach Secured by: Bridle Technique Used to Measure Tube Placement:  Documented cm marking at nare/ corner of mouth Cortrak Secured At:  69 cm    Cortrak Tube Team Note:  Consult received to place a Cortrak feeding tube.   No x-ray is required. RN may begin using tube.    If the tube becomes dislodged please keep the tube and contact the Cortrak team at [email or web address] (password [email or web address]) for replacement.  If after hours and replacement cannot be delayed, place a NG tube and confirm placement with an abdominal x-ray.    Berkeley Veldman King, MS, RD, LDN Pager number available on Amion 

## 2020-04-25 NOTE — Procedures (Signed)
Patient Name:Zachary Woods BRA:[id number] Epilepsy Attending:Markeith Jue Annabelle Harman Referring Physician/Provider:Dr Lynnell Catalan Duration:04/24/2020 1343 to 04/25/2020 1343  Patient history:55 y.o.malewith history of previous cryptogenic Left MCA stroke s/p PFO closure, lost to stroke follow up 2018, DM2, HTN, GERDpresenting withaphasia, right facial droop and right sided weakness noted upon waking up at 0300 this morning.EEG to evaluate for seizure  Level of alertness:lethargic, asleep  AEDs during EEG study:LEV, VPA, Propofol  Technical aspects: This EEG study was done with scalp electrodes positioned according to the 10-20 International system of electrode placement. Electrical activity was acquired at a sampling rate of 500Hz  and reviewed with a high frequency filter of 70Hz  and a low frequency filter of 1Hz . EEG data were recorded continuously and digitally stored.   Description:Noposterior dominant rhythm was seen.Sleep was characterized by sleep spindles (12 to 14 Hz), maximal frontocentral region.EEG showed continuous generalized 3 to 6 Hz theta-delta slowingwhich had triphasic morphology at times when patient was awake/stimulated.Spikes were seen in left temporal region which at times appeared periodic at 1 Hz.  ABNORMALITY -Spike, left temporal region -Continuousslow, generalized  IMPRESSION: This studyshowedevidence of epileptogenicity arising from left temporoparietal region. Additionally there ismoderate diffuse encephalopathy, nonspecific etiology.  Zachary Woods 

## 2020-04-25 NOTE — Progress Notes (Signed)
PT Cancellation Note  Patient Details Name: Zachary Woods MRN: [id number] DOB: June 19, 1965   Cancelled Treatment:    Reason Eval/Treat Not Completed: Medical issues which prohibited therapy. Pt remains intubated. Pt with in afib with resting HR observed up to 151 this morning. PT will hold until the patient's HR is more stable and the patient is better able to participate in skilled PT intervention.   Arlyss Gandy 04/25/2020, 11:23 AM

## 2020-04-25 NOTE — Progress Notes (Signed)
LTM maint complete - no skin breakdown under:  Fp1, fp2, f7  

## 2020-04-26 ENCOUNTER — Inpatient Hospital Stay (HOSPITAL_COMMUNITY): Payer: 59

## 2020-04-26 DIAGNOSIS — B9561 Methicillin susceptible Staphylococcus aureus infection as the cause of diseases classified elsewhere: Secondary | ICD-10-CM | POA: Diagnosis not present

## 2020-04-26 DIAGNOSIS — R6521 Severe sepsis with septic shock: Secondary | ICD-10-CM | POA: Diagnosis not present

## 2020-04-26 DIAGNOSIS — R569 Unspecified convulsions: Secondary | ICD-10-CM | POA: Diagnosis not present

## 2020-04-26 DIAGNOSIS — A4101 Sepsis due to Methicillin susceptible Staphylococcus aureus: Secondary | ICD-10-CM | POA: Diagnosis not present

## 2020-04-26 DIAGNOSIS — I63412 Cerebral infarction due to embolism of left middle cerebral artery: Secondary | ICD-10-CM | POA: Diagnosis not present

## 2020-04-26 DIAGNOSIS — I639 Cerebral infarction, unspecified: Secondary | ICD-10-CM | POA: Diagnosis not present

## 2020-04-26 DIAGNOSIS — J9600 Acute respiratory failure, unspecified whether with hypoxia or hypercapnia: Secondary | ICD-10-CM | POA: Diagnosis not present

## 2020-04-26 DIAGNOSIS — I6340 Cerebral infarction due to embolism of unspecified cerebral artery: Secondary | ICD-10-CM | POA: Diagnosis not present

## 2020-04-26 DIAGNOSIS — R7881 Bacteremia: Secondary | ICD-10-CM | POA: Diagnosis not present

## 2020-04-26 LAB — CBC WITH DIFFERENTIAL/PLATELET
Abs Immature Granulocytes: 0.18 10*3/uL — ABNORMAL HIGH (ref 0.00–0.07)
Basophils Absolute: 0 10*3/uL (ref 0.0–0.1)
Basophils Relative: 0 %
Eosinophils Absolute: 0 10*3/uL (ref 0.0–0.5)
Eosinophils Relative: 0 %
HCT: 29.9 % — ABNORMAL LOW (ref 39.0–52.0)
Hemoglobin: 10.4 g/dL — ABNORMAL LOW (ref 13.0–17.0)
Immature Granulocytes: 2 %
Lymphocytes Relative: 10 %
Lymphs Abs: 0.9 10*3/uL (ref 0.7–4.0)
MCH: 33.2 pg (ref 26.0–34.0)
MCHC: 34.8 g/dL (ref 30.0–36.0)
MCV: 95.5 fL (ref 80.0–100.0)
Monocytes Absolute: 0.3 10*3/uL (ref 0.1–1.0)
Monocytes Relative: 3 %
Neutro Abs: 8.1 10*3/uL — ABNORMAL HIGH (ref 1.7–7.7)
Neutrophils Relative %: 85 %
Platelets: 84 10*3/uL — ABNORMAL LOW (ref 150–400)
RBC: 3.13 MIL/uL — ABNORMAL LOW (ref 4.22–5.81)
RDW: 15 % (ref 11.5–15.5)
WBC: 9.6 10*3/uL (ref 4.0–10.5)
nRBC: 0.5 % — ABNORMAL HIGH (ref 0.0–0.2)

## 2020-04-26 LAB — CULTURE, BLOOD (ROUTINE X 2): Culture: NO GROWTH

## 2020-04-26 LAB — GLUCOSE, CAPILLARY
Glucose-Capillary: 262 mg/dL — ABNORMAL HIGH (ref 70–99)
Glucose-Capillary: 223 mg/dL — ABNORMAL HIGH (ref 70–99)
Glucose-Capillary: 248 mg/dL — ABNORMAL HIGH (ref 70–99)
Glucose-Capillary: 215 mg/dL — ABNORMAL HIGH (ref 70–99)
Glucose-Capillary: 277 mg/dL — ABNORMAL HIGH (ref 70–99)
Glucose-Capillary: 321 mg/dL — ABNORMAL HIGH (ref 70–99)

## 2020-04-26 LAB — COMPREHENSIVE METABOLIC PANEL
ALT: <5 U/L (ref 0–44)
AST: 53 U/L — ABNORMAL HIGH (ref 15–41)
Albumin: 1.3 g/dL — ABNORMAL LOW (ref 3.5–5.0)
Alkaline Phosphatase: 96 U/L (ref 38–126)
Anion gap: 19 — ABNORMAL HIGH (ref 5–15)
BUN: 151 mg/dL — ABNORMAL HIGH (ref 6–20)
CO2: 18 mmol/L — ABNORMAL LOW (ref 22–32)
Calcium: 7.4 mg/dL — ABNORMAL LOW (ref 8.9–10.3)
Chloride: 115 mmol/L — ABNORMAL HIGH (ref 98–111)
Creatinine, Ser: 3.45 mg/dL — ABNORMAL HIGH (ref 0.61–1.24)
GFR, Estimated: 20 mL/min — ABNORMAL LOW (ref >60)
Glucose, Bld: 301 mg/dL — ABNORMAL HIGH (ref 70–99)
Potassium: 3.7 mmol/L (ref 3.5–5.1)
Sodium: 152 mmol/L — ABNORMAL HIGH (ref 135–145)
Total Bilirubin: 6.8 mg/dL — ABNORMAL HIGH (ref 0.3–1.2)
Total Protein: 5 g/dL — ABNORMAL LOW (ref 6.5–8.1)

## 2020-04-26 LAB — POCT I-STAT 7, (LYTES, BLD GAS, ICA,H+H)
Acid-base deficit: 3 mmol/L — ABNORMAL HIGH (ref 0.0–2.0)
Bicarbonate: 21.9 mmol/L (ref 20.0–28.0)
Calcium, Ion: 0.92 mmol/L — ABNORMAL LOW (ref 1.15–1.40)
HCT: 28 % — ABNORMAL LOW (ref 39.0–52.0)
Hemoglobin: 9.5 g/dL — ABNORMAL LOW (ref 13.0–17.0)
O2 Saturation: 91 %
Potassium: 4 mmol/L (ref 3.5–5.1)
Sodium: 151 mmol/L — ABNORMAL HIGH (ref 135–145)
TCO2: 23 mmol/L (ref 22–32)
pCO2 arterial: 36.7 mmHg (ref 32.0–48.0)
pH, Arterial: 7.385 (ref 7.350–7.450)
pO2, Arterial: 61 mmHg — ABNORMAL LOW (ref 83.0–108.0)

## 2020-04-26 LAB — LACTATE DEHYDROGENASE: LDH: 352 U/L — ABNORMAL HIGH (ref 98–192)

## 2020-04-26 LAB — CSF CULTURE W GRAM STAIN: Culture: NO GROWTH

## 2020-04-26 MED ORDER — SODIUM BICARBONATE 8.4 % IV SOLN
INTRAVENOUS | Status: AC
  Filled 2020-04-26: qty 100

## 2020-04-26 MED ORDER — SODIUM CHLORIDE 0.9 % IV SOLN
100.0000 mg | Freq: Two times a day (BID) | INTRAVENOUS | Status: DC
  Administered 2020-04-26 – 2020-04-30 (×8): 100 mg via INTRAVENOUS
  Filled 2020-04-26 (×10): qty 10

## 2020-04-26 MED ORDER — VASOPRESSIN 20 UNITS/100 ML INFUSION FOR SHOCK
0.0000 [IU]/min | INTRAVENOUS | Status: DC
  Administered 2020-04-26 – 2020-04-30 (×8): 0.03 [IU]/min via INTRAVENOUS
  Filled 2020-04-26 (×9): qty 100

## 2020-04-26 MED ORDER — INSULIN ASPART 100 UNIT/ML ~~LOC~~ SOLN
0.0000 [IU] | SUBCUTANEOUS | Status: DC
  Administered 2020-04-26: 11 [IU] via SUBCUTANEOUS
  Administered 2020-04-26 (×2): 7 [IU] via SUBCUTANEOUS
  Administered 2020-04-27: 4 [IU] via SUBCUTANEOUS
  Administered 2020-04-27: 15 [IU] via SUBCUTANEOUS
  Administered 2020-04-27: 7 [IU] via SUBCUTANEOUS
  Administered 2020-04-27: 3 [IU] via SUBCUTANEOUS
  Administered 2020-04-27: 4 [IU] via SUBCUTANEOUS
  Administered 2020-04-27: 15 [IU] via SUBCUTANEOUS
  Administered 2020-04-28 – 2020-04-29 (×2): 4 [IU] via SUBCUTANEOUS
  Administered 2020-04-29: 11 [IU] via SUBCUTANEOUS
  Administered 2020-04-29: 7 [IU] via SUBCUTANEOUS
  Administered 2020-04-29: 3 [IU] via SUBCUTANEOUS
  Administered 2020-04-29 – 2020-04-30 (×2): 4 [IU] via SUBCUTANEOUS
  Administered 2020-04-30: 3 [IU] via SUBCUTANEOUS
  Administered 2020-04-30: 7 [IU] via SUBCUTANEOUS

## 2020-04-26 MED ORDER — SODIUM BICARBONATE 8.4 % IV SOLN
100.0000 meq | Freq: Once | INTRAVENOUS | Status: AC
  Administered 2020-04-26: 100 meq via INTRAVENOUS

## 2020-04-26 MED ORDER — LACTATED RINGERS IV BOLUS
1000.0000 mL | Freq: Once | INTRAVENOUS | Status: AC
  Administered 2020-04-26: 1000 mL via INTRAVENOUS

## 2020-04-26 MED ORDER — FENTANYL CITRATE (PF) 2500 MCG/50ML IJ SOLN
0.0000 ug/h | Status: DC
  Filled 2020-04-26: qty 100

## 2020-04-26 MED ORDER — AMIODARONE HCL IN DEXTROSE 360-4.14 MG/200ML-% IV SOLN
60.0000 mg/h | INTRAVENOUS | Status: AC
  Administered 2020-04-26: 60 mg/h via INTRAVENOUS
  Filled 2020-04-26: qty 200

## 2020-04-26 MED ORDER — NOREPINEPHRINE 4 MG/250ML-% IV SOLN
0.0000 ug/min | INTRAVENOUS | Status: DC
  Administered 2020-04-26: 15 ug/min via INTRAVENOUS
  Administered 2020-04-26: 16 ug/min via INTRAVENOUS
  Filled 2020-04-26: qty 250

## 2020-04-26 MED ORDER — INSULIN GLARGINE 100 UNIT/ML ~~LOC~~ SOLN
30.0000 [IU] | Freq: Two times a day (BID) | SUBCUTANEOUS | Status: DC
  Administered 2020-04-26: 30 [IU] via SUBCUTANEOUS
  Filled 2020-04-26 (×3): qty 0.3

## 2020-04-26 MED ORDER — FENTANYL 2500MCG IN NS 250ML (10MCG/ML) PREMIX INFUSION
0.0000 ug/h | INTRAVENOUS | Status: DC
  Administered 2020-04-26 – 2020-04-28 (×2): 50 ug/h via INTRAVENOUS
  Administered 2020-04-29: 175 ug/h via INTRAVENOUS
  Filled 2020-04-26 (×2): qty 250

## 2020-04-26 MED ORDER — NOREPINEPHRINE 4 MG/250ML-% IV SOLN
INTRAVENOUS | Status: AC
  Filled 2020-04-26: qty 250

## 2020-04-26 MED ORDER — SODIUM CHLORIDE 0.9 % IV SOLN
200.0000 mg | Freq: Once | INTRAVENOUS | Status: AC
  Administered 2020-04-26: 200 mg via INTRAVENOUS
  Filled 2020-04-26: qty 20

## 2020-04-26 MED ORDER — AMIODARONE HCL IN DEXTROSE 360-4.14 MG/200ML-% IV SOLN
30.0000 mg/h | INTRAVENOUS | Status: DC
  Administered 2020-04-27 – 2020-04-30 (×6): 30 mg/h via INTRAVENOUS
  Filled 2020-04-26 (×10): qty 200

## 2020-04-26 MED ORDER — SODIUM CHLORIDE 0.9 % IV SOLN: INTRAVENOUS | Status: DC | PRN

## 2020-04-26 MED ORDER — AMIODARONE IV BOLUS ONLY 150 MG/100ML
150.0000 mg | Freq: Once | INTRAVENOUS | Status: AC
  Administered 2020-04-26: 150 mg via INTRAVENOUS
  Filled 2020-04-26: qty 100

## 2020-04-26 MED ORDER — AMIODARONE HCL IN DEXTROSE 360-4.14 MG/200ML-% IV SOLN
60.0000 mg/h | INTRAVENOUS | Status: AC
  Administered 2020-04-26: 59.9999 mg/h via INTRAVENOUS
  Filled 2020-04-26: qty 200

## 2020-04-26 MED ORDER — NUTRISOURCE FIBER PO PACK
1.0000 | PACK | Freq: Two times a day (BID) | ORAL | Status: DC
  Administered 2020-04-26 – 2020-04-30 (×9): 1
  Filled 2020-04-26 (×10): qty 1

## 2020-04-26 MED ORDER — INSULIN ASPART 100 UNIT/ML ~~LOC~~ SOLN
10.0000 [IU] | SUBCUTANEOUS | Status: DC
  Administered 2020-04-26 – 2020-04-27 (×10): 10 [IU] via SUBCUTANEOUS

## 2020-04-26 MED ORDER — FUROSEMIDE 10 MG/ML IJ SOLN
80.0000 mg | Freq: Two times a day (BID) | INTRAMUSCULAR | Status: DC
  Administered 2020-04-26 – 2020-04-27 (×2): 80 mg via INTRAVENOUS
  Filled 2020-04-26 (×2): qty 8

## 2020-04-26 MED ORDER — FREE WATER
200.0000 mL | Status: DC
  Administered 2020-04-26 – 2020-04-27 (×15): 200 mL

## 2020-04-26 NOTE — Progress Notes (Signed)
[facility] Infectious Diseases Follow Up Note  Patient Identification: Patient Name: Zachary Woods MRN: [id number] Admit Date: 04/19/2020  4:07 AM Age: 55 y.o.Today's Date: 04/26/2020   Reason for Visit: MSSA bacteremia   Principal Problem:   MSSA bacteremia Active Problems:   PFO (patent foramen ovale)   Acute CVA (cerebrovascular accident) (HCC)   Subarachnoid hemorrhage (HCC)   Type 2 diabetes mellitus with hyperosmolar hyperglycemic state (HHS) (HCC)   AKI (acute kidney injury) (HCC)   SIRS (systemic inflammatory response syndrome) (HCC)   Metabolic acidosis with increased anion gap and accumulation of organic acids   Hypomagnesemia   Hypokalemia   Thrombocytopenia (HCC)   Stroke (HCC)   Suspected endocarditis  Antibiotics : Nafcillin 2/22 -                          Rifampin 2/22 -                     Gentamicin 2/22 -2/23                      Total days of antibiotics 7  Lines/tubes : PIVs, external urethral catheter, rectal tube , PIVs    Interval Events: still intubated, off sedation since yesterday.    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   # Acute bacterial meningitis   # Acute Respiratory failure  # AcuteLeft MCAIschemic Infarct( left frontal, parietal and occipital lobes) # Rt frontal SAH ? Concerns for septic emboli and mycotic aneurysm # Thrombocytopenia- in the setting of sepsis/endocarditis  # Worsening AKI  # Seizures  # Liver Cirrhosis - LFTs appear stable    Recommendations Continue Nafcillin and Rifampin as is. Early to suspect AIN due to nafcillin ( no eosinophilia/rash are most likely related to endocarditis and appear stable). Will follow Nephrology recs.  Will hold gentamicin for now given  worsening AKI. Recommend blood cx given patient is spiking fevers ( ordered) Monitor CBC, CMP  Vent management per CCM   Rest of the management as per the primary team. Thank you for the consult. Please page with pertinent questions or concerns.  ______________________________________________________________________ Subjective patient seen and examined at the bedside. Intubated, off sedation, not responsiove  Vitals BP (!) 87/58   Pulse (!) 113   Temp (!) 102.8 F (39.3 C) (Axillary) Comment: ice packs applied  Resp (!) 27   Ht  (1.676 m)   Wt 87.6 kg   SpO2 99%   BMI 31.17 kg/m     Physical Exam Intubated, non responsive HEENT - injected conunctiva Chest - vent sounds, coarse CVS - Normal s1s2, RRR Abdomen - soft, BS+ Extremities - edematous, anasarca  Skin - multiple non blanching erythematous lesions in bilateral hand and feet ( appears to be stable)  Pertinent Microbiology Results for orders placed or performed during the hospital encounter of 04/10/2020  Resp Panel by RT-PCR (Flu A&B, Covid) Nasopharyngeal Swab     Status: None   Collection Time: 04/29/2020  4:20 AM   Specimen: Nasopharyngeal Swab; Nasopharyngeal(NP) swabs in vial transport medium  Result Value Ref Range Status   SARS Coronavirus 2 by RT PCR NEGATIVE NEGATIVE Final    Comment: (NOTE) SARS-CoV-2 target nucleic acids are NOT DETECTED.  The SARS-CoV-2 RNA is  generally detectable in upper respiratory specimens during the acute phase of infection. The lowest concentration of SARS-CoV-2 viral copies this assay can detect is 138 copies/mL. A negative result does not preclude SARS-Cov-2 infection and should not be used as the sole basis for treatment or other patient management decisions. A negative result may occur with  improper specimen collection/handling, submission of specimen other than nasopharyngeal swab, presence of viral mutation(s) within the areas targeted by this assay, and inadequate  number of viral copies(<138 copies/mL). A negative result must be combined with clinical observations, patient history, and epidemiological information. The expected result is Negative.  Fact Sheet for Patients:  BloggerCourse.com  Fact Sheet for Healthcare Providers:  SeriousBroker.it  This test is no t yet approved or cleared by the [place] FDA and  has been authorized for detection and/or diagnosis of SARS-CoV-2 by FDA under an Emergency Use Authorization (EUA). This EUA will remain  in effect (meaning this test can be used) for the duration of the COVID-19 declaration under Section 564(b)(1) of the Act, 21 U.S.C.section 360bbb-3(b)(1), unless the authorization is terminated  or revoked sooner.       Influenza A by PCR NEGATIVE NEGATIVE Final   Influenza B by PCR NEGATIVE NEGATIVE Final    Comment: (NOTE) The Xpert Xpress SARS-CoV-2/FLU/RSV plus assay is intended as an aid in the diagnosis of influenza from Nasopharyngeal swab specimens and should not be used as a sole basis for treatment. Nasal washings and aspirates are unacceptable for Xpert Xpress SARS-CoV-2/FLU/RSV testing.  Fact Sheet for Patients: BloggerCourse.com  Fact Sheet for Healthcare Providers: SeriousBroker.it  This test is not yet approved or cleared by the [place] FDA and has been authorized for detection and/or diagnosis of SARS-CoV-2 by FDA under an Emergency Use Authorization (EUA). This EUA will remain in effect (meaning this test can be used) for the duration of the COVID-19 declaration under Section 564(b)(1) of the Act, 21 U.S.C. section 360bbb-3(b)(1), unless the authorization is terminated or revoked.  Performed at [facility], [address], [place], [place] [address]   Culture, blood (routine x 2)     Status: Abnormal   Collection Time: 04/05/2020  5:15 PM   Specimen: Site  Not Specified; Blood  Result Value Ref Range Status   Specimen Description SITE NOT SPECIFIED  Final   Special Requests   Final    BOTTLES DRAWN AEROBIC AND ANAEROBIC Blood Culture adequate volume   Culture  Setup Time   Final    IN BOTH AEROBIC AND ANAEROBIC BOTTLES GRAM POSITIVE COCCI CRITICAL RESULT CALLED TO, READ BACK BY AND VERIFIED WITH: J MILLEN PHARMD @0854  04/21/20 EB Performed at [facility], [address], [place], [place]    Culture STAPHYLOCOCCUS AUREUS (A)  Final   Report Status 04/25/2020 FINAL  Final   Organism ID, Bacteria STAPHYLOCOCCUS AUREUS  Final      Susceptibility   Staphylococcus aureus - MIC*    CIPROFLOXACIN <=0.5 SENSITIVE Sensitive     ERYTHROMYCIN <=0.25 SENSITIVE Sensitive     GENTAMICIN <=0.5 SENSITIVE Sensitive     OXACILLIN 0.5 SENSITIVE Sensitive     TETRACYCLINE >=16 RESISTANT Resistant     VANCOMYCIN <=0.5 SENSITIVE Sensitive     TRIMETH/SULFA <=10 SENSITIVE Sensitive     CLINDAMYCIN <=0.25 SENSITIVE Sensitive     RIFAMPIN <=0.5 SENSITIVE Sensitive     Inducible Clindamycin NEGATIVE Sensitive     * STAPHYLOCOCCUS AUREUS  Blood Culture ID Panel (Reflexed)  Status: Abnormal   Collection Time: 04/24/2020  5:15 PM  Result Value Ref Range Status   Enterococcus faecalis NOT DETECTED NOT DETECTED Final   Enterococcus Faecium NOT DETECTED NOT DETECTED Final   Listeria monocytogenes NOT DETECTED NOT DETECTED Final   Staphylococcus species DETECTED (A) NOT DETECTED Final    Comment: CRITICAL RESULT CALLED TO, READ BACK BY AND VERIFIED WITH: J MILLEN PHARMD @0854  04/21/20 EB    Staphylococcus aureus (BCID) DETECTED (A) NOT DETECTED Final    Comment: CRITICAL RESULT CALLED TO, READ BACK BY AND VERIFIED WITH: J MILLEN PHARMD @0854  04/21/20 EB    Staphylococcus epidermidis NOT DETECTED NOT DETECTED Final   Staphylococcus lugdunensis NOT DETECTED NOT DETECTED Final   Streptococcus species NOT DETECTED NOT DETECTED Final    Streptococcus agalactiae NOT DETECTED NOT DETECTED Final   Streptococcus pneumoniae NOT DETECTED NOT DETECTED Final   Streptococcus pyogenes NOT DETECTED NOT DETECTED Final   A.calcoaceticus-baumannii NOT DETECTED NOT DETECTED Final   Bacteroides fragilis NOT DETECTED NOT DETECTED Final   Enterobacterales NOT DETECTED NOT DETECTED Final   Enterobacter cloacae complex NOT DETECTED NOT DETECTED Final   Escherichia coli NOT DETECTED NOT DETECTED Final   Klebsiella aerogenes NOT DETECTED NOT DETECTED Final   Klebsiella oxytoca NOT DETECTED NOT DETECTED Final   Klebsiella pneumoniae NOT DETECTED NOT DETECTED Final   Proteus species NOT DETECTED NOT DETECTED Final   Salmonella species NOT DETECTED NOT DETECTED Final   Serratia marcescens NOT DETECTED NOT DETECTED Final   Haemophilus influenzae NOT DETECTED NOT DETECTED Final   Neisseria meningitidis NOT DETECTED NOT DETECTED Final   Pseudomonas aeruginosa NOT DETECTED NOT DETECTED Final   Stenotrophomonas maltophilia NOT DETECTED NOT DETECTED Final   Candida albicans NOT DETECTED NOT DETECTED Final   Candida auris NOT DETECTED NOT DETECTED Final   Candida glabrata NOT DETECTED NOT DETECTED Final   Candida krusei NOT DETECTED NOT DETECTED Final   Candida parapsilosis NOT DETECTED NOT DETECTED Final   Candida tropicalis NOT DETECTED NOT DETECTED Final   Cryptococcus neoformans/gattii NOT DETECTED NOT DETECTED Final   Meth resistant mecA/C and MREJ NOT DETECTED NOT DETECTED Final    Comment: Performed at [facility], [address], [place], [address] [place]  MRSA PCR Screening     Status: None   Collection Time: 04/16/2020  7:36 PM   Specimen: Nasal Mucosa; Nasopharyngeal  Result Value Ref Range Status   MRSA by PCR NEGATIVE NEGATIVE Final    Comment:        The GeneXpert MRSA Assay (FDA approved for NASAL specimens only), is one component of a comprehensive MRSA colonization surveillance program. It is not intended to diagnose  MRSA infection nor to guide or monitor treatment for MRSA infections. Performed at [facility], [address], [place], [address] [place]   Culture, blood (Routine X 2) w Reflex to ID Panel     Status: None   Collection Time: 04/22/2020  9:20 PM   Specimen: BLOOD LEFT HAND  Result Value Ref Range Status   Specimen Description BLOOD LEFT HAND  Final   Special Requests AEROBIC BOTTLE ONLY Blood Culture adequate volume  Final   Culture   Final    NO GROWTH 5 DAYS Performed at [facility], [address], [place], [address] [place]    Report Status 04/25/2020 FINAL  Final  Culture, blood (routine x 2)     Status: None   Collection Time: 04/19/2020  9:27 PM  Specimen: BLOOD RIGHT HAND  Result Value Ref Range Status   Specimen Description BLOOD RIGHT HAND  Final   Special Requests AEROBIC BOTTLE ONLY Blood Culture adequate volume  Final   Culture   Final    NO GROWTH 5 DAYS Performed at [facility], [address] [place], [place] [address]    Report Status 04/25/2020 FINAL  Final  Urine Culture     Status: Abnormal   Collection Time: 04/21/20  5:48 AM   Specimen: Urine, Clean Catch  Result Value [facility] Range Status   Specimen Description URINE, CLEAN CATCH  Final   Special Requests   Final    NONE Performed at [facility], [address] [place], [place] [address]    Culture 50,000 COLONIES/mL STAPHYLOCOCCUS AUREUS (A)  Final   Report Status 04/17/2020 FINAL  Final   Organism ID, Bacteria STAPHYLOCOCCUS AUREUS (A)  Final      Susceptibility   Staphylococcus aureus - MIC*    CIPROFLOXACIN <=0.5 SENSITIVE Sensitive     GENTAMICIN <=0.5 SENSITIVE Sensitive     NITROFURANTOIN <=16 SENSITIVE Sensitive     OXACILLIN <=0.25 SENSITIVE Sensitive     TETRACYCLINE >=16 RESISTANT Resistant     VANCOMYCIN <=0.5 SENSITIVE Sensitive     TRIMETH/SULFA <=10 SENSITIVE Sensitive     CLINDAMYCIN <=0.25 SENSITIVE Sensitive     RIFAMPIN <=0.5 SENSITIVE Sensitive      Inducible Clindamycin NEGATIVE Sensitive     * 50,000 COLONIES/mL STAPHYLOCOCCUS AUREUS  Culture, blood (routine x 2)     Status: Abnormal   Collection Time: 04/21/20 10:42 AM   Specimen: BLOOD RIGHT HAND  Result Value Ref Range Status   Specimen Description BLOOD RIGHT HAND  Final   Special Requests   Final    AEROBIC BOTTLE ONLY Blood Culture results may not be optimal due to an inadequate volume of blood received in culture bottles   Culture  Setup Time   Final    AEROBIC BOTTLE ONLY GRAM POSITIVE COCCI CRITICAL RESULT CALLED TO, READ BACK BY AND VERIFIED WITH: V BRYK PHARMD 04/24/20 2301 JDW    Culture (A)  Final    STAPHYLOCOCCUS AUREUS SUSCEPTIBILITIES PERFORMED ON PREVIOUS CULTURE WITHIN THE LAST 5 DAYS. Performed at [facility], [address] [place], [place] [address]    Report Status 04/25/2020 FINAL  Final  Culture, blood (routine x 2)     Status: None   Collection Time: 04/21/20 10:52 AM   Specimen: BLOOD RIGHT HAND  Result Value Ref Range Status   Specimen Description BLOOD RIGHT HAND  Final   Special Requests   Final    AEROBIC BOTTLE ONLY Blood Culture results may not be optimal due to an inadequate volume of blood received in culture bottles   Culture   Final    NO GROWTH 5 DAYS Performed at [facility], [address] [place], [place] [address]    Report Status 04/26/2020 FINAL  Final  Culture, blood (routine x 2)     Status: None (Preliminary result)   Collection Time: 04/24/2020 10:01 AM   Specimen: BLOOD LEFT HAND  Result Value Ref Range Status   Specimen Description BLOOD LEFT HAND  Final   Special Requests   Final    BOTTLES DRAWN AEROBIC ONLY Blood Culture results may not be optimal due to an excessive volume of blood received in culture bottles   Culture   Final    NO GROWTH 3  DAYS Performed at [facility], [address] [place], [place] [address]    Report Status PENDING  Incomplete  CSF culture     Status: None   Collection  Time: 04/25/2020 11:42 AM   Specimen: CSF; Cerebrospinal Fluid  Result Value Ref Range Status   Specimen Description CSF  Final   Special Requests LUMBAR  Final   Gram Stain   Final    CYTOSPIN SMEAR WBC PRESENT, PREDOMINANTLY MONONUCLEAR NO ORGANISMS SEEN    Culture   Final    NO GROWTH 3 DAYS Performed at [facility], [address], [place], [place] [address]    Report Status 04/26/2020 FINAL  Final    Pertinent Lab. CBC Latest Ref Rng & Units 04/26/2020 04/25/2020 04/24/2020  WBC 4.0 - 10.5 K/uL 9.6 7.5 7.6  Hemoglobin 13.0 - 17.0 g/dL 10.4(L) 10.8(L) 10.1(L)  Hematocrit 39.0 - 52.0 % 29.9(L) 30.7(L) 28.2(L)  Platelets 150 - 400 K/uL 84(L) 76(L) 63(L)   CMP Latest Ref Rng & Units 04/26/2020 04/25/2020 04/24/2020  Glucose 70 - 99 mg/dL 262(M) 355(H) 741(U)  BUN 6 - 20 mg/dL 384(T) 364(W) 80(H)  Creatinine 0.61 - 1.24 mg/dL 2.12(Y) 4.82(N) 0.03(B)  Sodium 135 - 145 mmol/L 152(H) 149(H) 148(H)  Potassium 3.5 - 5.1 mmol/L 3.7 3.8 3.4(L)  Chloride 98 - 111 mmol/L 115(H) 113(H) 111  CO2 22 - 32 mmol/L 18(L) 19(L) 22  Calcium 8.9 - 10.3 mg/dL 7.4(L) 7.4(L) 7.5(L)  Total Protein 6.5 - 8.1 g/dL 5.0(L) - -  Total Bilirubin 0.3 - 1.2 mg/dL 6.8(H) - -  Alkaline Phos 38 - 126 U/L 96 - -  AST 15 - 41 U/L 53(H) - -  ALT 0 - 44 U/L <5 - -      Pertinent Imaging today Plain films and CT images have been personally visualized and interpreted; radiology reports have been reviewed. Decision making incorporated into the Impression / Recommendations.  US abdomen 04/25/20 IMPRESSION: 1. Negative for hydronephrosis. 2. Interval development of marked gallbladder wall thickening measuring up to 11 mm, but no evidence for shadowing stone. Findings could be secondary to a calculus cholecystitis versus gallbladder wall thickening secondary to liver disease or edema forming states. Correlation with nuclear medicine hepatobiliary imaging could be considered. 3. Echogenic liver.  Subtle  contour nodularity suggesting cirrhosis.  I have spent approx 30 minutes for this patient encounter including review of prior medical records with greater than 50% of time being face to face and coordination of their care.  Electronically signed by:   Odette Fraction, MD Infectious Disease Physician [facility] for Infectious Disease Pager: [phone]

## 2020-04-26 NOTE — Progress Notes (Signed)
NAME:  Zachary Woods, MRN:  [id number], DOB:  05-Sep-1965, LOS: 6 ADMISSION DATE:  04/04/2020, CHIEF COMPLAINT: AMS in the setting of acute stroke   Brief History:  55 year old male who was admitted on 2/18 with left MCA infarction and small frontal SAH. Required intubation for progressive AMS.  History of Present Illness:  Shell Yandow is a 55 year old male with pertinent PMH of HTN, CVA, PFO s/p closure in 2018, DM 2, and GERD who presented to the ED with right sided weakness. He was last in his normal state of health the day prior. Overnight, the patient got up to go to the bathroom and could not move his right side and fell to the ground.   In ED, CT head showed small volume SAH. CTA/CTP demonstrated moderate sized left MCA territory ischemic area.   Patient's AMS continued to progress and needed to be intubated. PCCM was subsequently consulted and patient was transferred/ admitted to Neuro CCU. Per note, neurology is worried about septic emboli and recommend a TEE looking for endocarditis and vegetative valvular lesions. Empiric Vanc/ Cefepime.   Past Medical History:  has Hypertension; GERD (gastroesophageal reflux disease); Malignant hypertension; Cerebral embolism with cerebral infarction; Stroke determined by clinical assessment (HCC); PFO (patent foramen ovale); Acute CVA (cerebrovascular accident) (HCC); Subarachnoid hemorrhage (HCC); Type 2 diabetes mellitus with hyperosmolar hyperglycemic state (HHS) (HCC); AKI (acute kidney injury) (HCC); SIRS (systemic inflammatory response syndrome) (HCC); Metabolic acidosis with increased anion gap and accumulation of organic acids; Hypomagnesemia; Hypokalemia; Thrombocytopenia (HCC); and Stroke (HCC) on their problem list.   Significant Hospital Events:  2/18 > admit, intubated  Consults:  Cardiology, Cardio-Thoracic and Neurology   Procedures:    Significant Diagnostic Tests:  CT / CTA / CTP head and neck 2/18 > small volume right  frontal SAH, left MCA occlusion, ischemic area of 37cc with core infarct 21cc, 60% prox left ICA stenosis. MRI brain 2/18 > mod to large acute left MCA infarct, 1.8cm acute cortical / subcortical left occipital lobe infarct, small volume right frontal lobe SAH. Echo 2/18 > EF 60 - 65%, no source of embolism detected. LE duplex 2/18 > neg. Carotid US 2/18 > 1 - 39% stenosis left ICA. EEG 2/18 > mod to diffuse encephalopathy without seizures or epileptiform discharges. Chest x-ray 2/19-poor inspiratory effort but otherwise normal TEE 2/22 >  Micro Data:  COVID 2/18 > neg. Flu 2/18 > neg. Blood cultures 2/18- MSSA positive. 2/19 negative.   Antimicrobials:  Naficillin Interim History / Subjective:  Patient remains sedated and does not respond to voice or painful stimuli. Having some active seizure activity almost hourly. No other changes in mental status overnight and has been having some loose stools.  Objective   Blood pressure 100/60, pulse (!) 110, temperature (!) 102.8 F (39.3 C), temperature source Axillary, resp. rate (!) 31, height 5\' 6"  (1.676 m), weight 87.6 kg, SpO2 100 %.    Vent Mode: PRVC FiO2 (%):  [30 %] 30 % Set Rate:  [18 bmp] 18 bmp Vt Set:  [510 mL] 510 mL PEEP:  [5 cmH20] 5 cmH20 Plateau Pressure:  [9 cmH20-22 cmH20] 22 cmH20   Intake/Output Summary (Last 24 hours) at 04/26/2020 1304 Last data filed at 04/26/2020 1200 Gross per 24 hour  Intake 11094.19 ml  Output 1650 ml  Net 9444.19 ml   Filed Weights   04/21/20 0342 04/22/20 0400 04/25/20 0500  Weight: 82.4 kg 87.6 kg 87.6 kg    Examination: General:  Sedated. Unresponsive to voice or noxious stimuli.  HENT: Atraumatic. Eye constricted and equal. Non-reactive to light.  Neck: slightly less rigid on examination Lungs: Clear to auscultation. Cardiovascular: Tachycardic, but normal rhythm. No M/R/G  Extremities: warm/dry, no edema.  Neuro: No response to voice or painful stimuli. CNII/III: Pupils  constricted without reactivity to light. Doll's eyes negative. CN V/VII: Corneal reflex normal on left eye, but not on right. CN X: cough reflex intact Skin: Positive for janeway lesions on UE bilaterally and left LE. Improving spider nevi throughout chest, back, and lower extremities GU: Catheter in place   Resolved Hospital Problem list     Assessment & Plan:  Critically ill due to Left MCA stroke with left MCA branch occlusion with small right frontal subarachnoid. Multifactorial encephalopathy:  Extubation not indicated  - Add Vimpat 100mg   - Keppra at 500mg  IV Q12h - Continue Valproic acid 500mg  TID  - Continuous EEG monitoring  - Attempt SAT and SBT   Infective endocarditis: MSSA bacteremia, Bacterial Meningitis- possible skin source, sepsis: Improving - Continue Naficillin as per ID recs for better CNS coverage - Continue Rifampin per ID for PVE   Atrial Fibrillation:  - Add continuous Amiodarone 60mg /hr IV    Uncontrolled type 2 diabetes; Stable  - Continue TF  - Change Novolog for TF coverage to 10 units Q4h  - Continue 20 units Lantus BID   Hypotensinon: Stable  -  Titrate Levophed as needed. Goal SBP > 100 and MAP > 65  Hypernatremia:  - Increase fee water to 200 mL Q2h   Hypokalemia: Resolved  - Monitor BMP   Constipation- No BM since 2/19: Improved - Continue bowel regimen of Colace, Miralax and Senna  Loose Stool: - Add Nutrisource fiber    Best practice (evaluated daily)  Pain/Anxiety/Delirium protocol (if indicated): None Neuro vitals: every 2 hours AED's: Keppra and Valproic Acid  VAP protocol (if indicated): Intubated, maintain VAP protocols Respiratory support goals: Titrate oxygen to keep sats greater than 88% Blood pressure target: Keep systolic above 100 DVT prophylaxis: Heparin 3 times daily Nutrition Status:  HOLD for TEE GI prophylaxis: Not indicated Fluid status goals: Allow autoregulation at this time Urinary catheter: Use external  catheter Central lines: No central line Glucose control: Type 2 diabetes acceptable control Mobility/therapy needs: Bedrest Antibiotic de-escalation: Naficillin Home medication reconciliation: On hold Daily labs: CBC BMP Code Status: Full code Family Communication: Wife updated at bedside 2/22 Disposition: ICU  Goals of Care:  Last date of multidisciplinary goals of care discussion:Pending  Family and staff present: Pending  Summary of discussion: Pending  Follow up goals of care discussion due: Pending  Code Status: Full  Labs   CBC: Recent Labs  Lab 04/12/2020 0416 04/08/2020 0424 04/23/2020 1714 04/21/20 0423 04/22/20 0345 04/22/20 1706 August 22, 2020 0405 04/24/20 0556 04/25/20 0521 04/26/20 0159  WBC 6.7  --  12.0*   < > 9.8  --  7.3 7.6 7.5 9.6  NEUTROABS 6.1  --  11.0*  --   --   --   --   --   --  8.1*  HGB 15.4   < > 13.5   < > 12.6* 11.6* 11.5* 10.1* 10.8* 10.4*  HCT 42.0   < > 37.9*   < > 35.1* 34.0* 33.6* 28.2* 30.7* 29.9*  MCV 93.5  --  94.0   < > 94.4  --  97.4 97.6 97.2 95.5  PLT 74*  --  62*   < > 47*  --  53* 63* 76* 84*   < > = values in this interval not displayed.    Basic Metabolic Panel: Recent Labs  Lab 04/21/20 0423 04/21/20 2141 04/22/20 0345 04/22/20 1604 04/22/20 1706 05/06/20 0405 05-06-20 1630 04/24/20 0556 04/25/20 0521 04/26/20 0159  NA 136  --  140  --  145 145  --  148* 149* 152*  K 3.4*  --  3.8  --  4.0 3.8  --  3.4* 3.8 3.7  CL 108  --  111  --   --  116*  --  111 113* 115*  CO2 15*  --  15*  --   --  17*  --  22 19* 18*  GLUCOSE 153*  --  173*  --   --  289*  --  274* 343* 301*  BUN 23*  --  35*  --   --  66*  --  85* 127* 151*  CREATININE 1.24  --  1.19  --   --  1.62*  --  1.92* 2.93* 3.45*  CALCIUM 7.7*  --  7.9*  --   --  7.8*  --  7.5* 7.4* 7.4*  MG 2.1  --   --  2.6*  --  2.6* 2.8* 2.8*  --   --   PHOS 1.6* 2.6  --  3.4  --  2.2* 3.1 5.5*  --   --    GFR: Estimated Creatinine Clearance: 25.4 mL/min (A) (by C-G formula  based on SCr of 3.45 mg/dL (H)). Recent Labs  Lab 04/16/2020 1714 04/29/2020 2112 04/21/20 0423 04/21/20 1815 04/22/20 0345 04/22/20 1112 2020/05/06 0405 04/24/20 0556 04/25/20 0521 04/26/20 0159  PROCALCITON 34.38  --   --   --   --   --   --   --   --   --   WBC 12.0*  --    < >  --    < >  --  7.3 7.6 7.5 9.6  LATICACIDVEN 5.8* 4.5*  --  4.1*  --  2.9*  --   --   --   --    < > = values in this interval not displayed.    Liver Function Tests: Recent Labs  Lab 04/06/2020 0416 04/26/20 0159  AST 40 53*  ALT 23 <5  ALKPHOS 127* 96  BILITOT 1.7* 6.8*  PROT 6.2* 5.0*  ALBUMIN 2.8* 1.3*   No results for input(s): LIPASE, AMYLASE in the last 168 hours. No results for input(s): AMMONIA in the last 168 hours.  ABG    Component Value Date/Time   PHART 7.265 (L) 04/22/2020 1706   PCO2ART 42.3 04/22/2020 1706   PO2ART 292 (H) 04/22/2020 1706   HCO3 19.1 (L) 04/22/2020 1706   TCO2 20 (L) 04/22/2020 1706   ACIDBASEDEF 7.0 (H) 04/22/2020 1706   O2SAT 100.0 04/22/2020 1706     Coagulation Profile: Recent Labs  Lab 04/17/2020 0416  INR 1.3*    Cardiac Enzymes: No results for input(s): CKTOTAL, CKMB, CKMBINDEX, TROPONINI in the last 168 hours.  HbA1C: Hgb A1c MFr Bld  Date/Time Value Ref Range Status  04/05/2020 12:17 PM 10.5 (H) 4.8 - 5.6 % Final    Comment:    (NOTE) Pre diabetes:          5.7%-6.4%  Diabetes:              >6.4%  Glycemic control for   <7.0% adults with diabetes   08/04/2019 05:25 PM 8.2 (  H) 4.8 - 5.6 % Final    Comment:             Prediabetes: 5.7 - 6.4          Diabetes: >6.4          Glycemic control for adults with diabetes: <7.0     CBG: Recent Labs  Lab 04/25/20 1940 04/25/20 2321 04/26/20 0323 04/26/20 0746 04/26/20 1116  GLUCAP 260* 325* 262* 223* 248*    Past Medical History:  He,  has a past medical history of Acid reflux, CVA (cerebral vascular accident) (HCC), Diabetes mellitus, type 2 (HCC), and Hypertension.    Surgical History:   Past Surgical History:  Procedure Laterality Date  . PATENT FORAMEN [place]VALE(PFO) CLOSURE N/A 08/01/2016   Procedure: Patent [place](PFO) Closure;  Surgeon: Tonny Bollman, MD;  Location: [facility] INVASIVE CV LAB;  Service: Cardiovascular;  Laterality: N/A;  . TEE WITHOUT CARDIOVERSION N/A 07/21/2016   Procedure: TRANSESOPHAGEAL ECHOCARDIOGRAM (TEE);  Surgeon: Lars Masson, MD;  Location: [facility] ENDOSCOPY;  Service: Cardiovascular;  Laterality: N/A;     Social History:   reports that he has quit smoking. He has never used smokeless tobacco. He reports current alcohol use of about 1.0 standard drink of alcohol per week. He reports that he does not use drugs.   Family History:  His family history includes Diabetes in his brother and father.   Allergies No Active Allergies   Home Medications  Prior to Admission medications   Medication Sig Start Date End Date Taking? Authorizing Provider  amLODipine (NORVASC) 10 MG tablet Take 1 tablet (10 mg total) by mouth daily. 02/04/18  Yes Shade Flood, MD  aspirin EC 81 MG tablet Take 1 tablet (81 mg total) by mouth daily. 08/05/17  Yes Janetta Hora, PA-C  ibuprofen (ADVIL) 200 MG tablet Take 400 mg by mouth every 6 (six) hours as needed for headache or mild pain.   Yes [provider]  meloxicam (MOBIC) 7.5 MG tablet Take 7.5 mg by mouth daily. 06/17/19  Yes [provider]  metFORMIN (GLUCOPHAGE) 500 MG tablet Take 1 tablet (500 mg total) by mouth daily with breakfast. 08/04/19  Yes Shade Flood, MD  Multiple Vitamin (MULTIVITAMIN WITH MINERALS) TABS tablet Take 1 tablet by mouth daily.   Yes [provider]  omeprazole (PRILOSEC OTC) 20 MG tablet Take 20 mg by mouth daily.   Yes [provider]  pantoprazole (PROTONIX) 40 MG tablet TAKE 1 TABLET BY MOUTH EVERY DAY Patient taking differently: Take 40 mg by mouth daily. 03/25/17  Yes Tonny Bollman, MD  sildenafil (VIAGRA) 100 MG  tablet Take 0.5-1 tablets (50-100 mg total) by mouth daily as needed for erectile dysfunction. 11/27/16  Yes Shade Flood, MD  Tetrahydrozoline HCl (VISINE OP) Place 1 drop into both eyes daily as needed (dry eyes).   Yes [provider]  hydrochlorothiazide (MICROZIDE) 12.5 MG capsule TAKE 1 CAPSULE BY MOUTH EVERY DAY Patient not taking: No sig reported 06/23/18   Shade Flood, MD     Critical care time:       Andreas Newport, MS4

## 2020-04-26 NOTE — Progress Notes (Signed)
PT Cancellation Note  Patient Details Name: TREVAUN RENDLEMAN MRN: [id number] DOB: 13-Aug-1965   Cancelled Treatment:    Reason Eval/Treat Not Completed: Medical issues which prohibited therapy. Per discussion with RN patient remains inappropriate for PT intervention at this time. Pt remains intubated and is not responsive despite sedation being turned off. PT will attempt to follow up tomorrow.   Arlyss Gandy 04/26/2020, 2:22 PM

## 2020-04-26 NOTE — Procedures (Addendum)
Patient Name:Zachary Woods ENI:[id number] Epilepsy Attending:Jak Haggar Annabelle Harman Referring Physician/Provider:Dr Lynnell Catalan Duration:04/25/2020 1343 to 04/26/2020 1343  Patient history:55 y.o.malewith history of previous cryptogenic Left MCA stroke s/p PFO closure, lost to stroke follow up 2018, DM2, HTN, GERDpresenting withaphasia, right facial droop and right sided weakness noted upon waking up at 0300 this morning.EEG to evaluate for seizure  Level of alertness:lethargic, asleep  AEDs during EEG study:LEV, VPA, Propofol  Technical aspects: This EEG study was done with scalp electrodes positioned according to the 10-20 International system of electrode placement. Electrical activity was acquired at a sampling rate of 500Hz  and reviewed with a high frequency filter of 70Hz  and a low frequency filter of 1Hz . EEG data were recorded continuously and digitally stored.   Description:Noposterior dominant rhythm was seen.Sleep was characterized by sleep spindles (12 to 14 Hz), maximal frontocentral region.EEG showed continuous generalized 3 to 6 Hz theta-delta slowingwith triphasic morphology at times.Spikes were seen in left temporal region which at times appeared periodic at 1 Hz. Seizures without clinical signs were also noted in left temporoparietal region, average one seizure every few hours, lasting 1 to 2 minutes each.  ABNORMALITY -Seizure without clinical signs, left temporoparietal region -Spike, left temporal region -Continuousslow, generalized  IMPRESSION: This studyshowedmultiple seizures without clinical signs arising from left temporoparietal region, one seizure every few hours, lasting 1 to 2 minutes each. Additionally there ismoderate diffuse encephalopathy, nonspecific etiology.  Verlie Liotta 

## 2020-04-26 NOTE — Progress Notes (Signed)
Transitions of Care Team following this patient for discharge planning.  Currently, patient not medically stable; will continue to follow as patient progresses.    Nadia Rayyan LCSW  

## 2020-04-26 NOTE — Progress Notes (Signed)
STROKE TEAM PROGRESS NOTE   INTERVAL HISTORY No family at the bedside. Pt still intubated not responsive. Not in afib anymore, however, continue to have seizure on TEE, added vimpat on top of keppra and depakote. Continue to have fever and worsening AKI. On nafcillin and rifampin. Persistent hyperglycemia and hypernatremia, on lantus and FW.   Vitals:   04/26/20 0330 04/26/20 0400 04/26/20 0500 04/26/20 0600  BP:  (!) 101/54 (!) 106/56 (!) 92/58  Pulse:   (!) 114 (!) 114  Resp: (!) 28 (!) 29 (!) 30 (!) 31  Temp:  (!) 102.3 F (39.1 C)    TempSrc:  Axillary    SpO2: 100%  100% 100%  Weight:      Height:       CBC:  Recent Labs  Lab 04/05/2020 1714 04/21/20 0423 04/25/20 0521 04/26/20 0159  WBC 12.0*   < > 7.5 9.6  NEUTROABS 11.0*  --   --  8.1*  HGB 13.5   < > 10.8* 10.4*  HCT 37.9*   < > 30.7* 29.9*  MCV 94.0   < > 97.2 95.5  PLT 62*   < > 76* 84*   < > = values in this interval not displayed.   Basic Metabolic Panel:  Recent Labs  Lab 04/22/2020 1630 04/24/20 0556 04/25/20 0521 04/26/20 0159  NA  --  148* 149* 152*  K  --  3.4* 3.8 3.7  CL  --  111 113* 115*  CO2  --  22 19* 18*  GLUCOSE  --  274* 343* 301*  BUN  --  85* 127* 151*  CREATININE  --  1.92* 2.93* 3.45*  CALCIUM  --  7.5* 7.4* 7.4*  MG 2.8* 2.8*  --   --   PHOS 3.1 5.5*  --   --    Lipid Panel:  Recent Labs  Lab 04/18/2020 1217 04/24/20 0556  CHOL 98  --   TRIG 327* 187*  HDL <10*  --   CHOLHDL NOT CALCULATED  --   VLDL 65*  --   LDLCALC NOT CALCULATED  --    HgbA1c:  Recent Labs  Lab 04/24/2020 1217  HGBA1C 10.5*   Urine Drug Screen:  Recent Labs  Lab 04/18/2020 1955  LABOPIA POSITIVE*  COCAINSCRNUR NONE DETECTED  LABBENZ NONE DETECTED  AMPHETMU NONE DETECTED  THCU NONE DETECTED  LABBARB NONE DETECTED    Alcohol Level No results for input(s): ETH in the last 168 hours.  IMAGING past 24 hours US Abdomen Complete  Result Date: 04/25/2020 CLINICAL DATA:  Renal failure EXAM: ABDOMEN  ULTRASOUND COMPLETE COMPARISON:  Ultrasound 04/21/2020 FINDINGS: Gallbladder: No shadowing stones. Unable to assess sonographic Eulah PontMurphy due to ventilated patient. Interval development of marked gallbladder wall thickening measuring up to 11 mm. Common bile duct: Diameter: 2.8 mm Liver: Subtle contour nodularity suggestive of cirrhosis. No focal hepatic abnormality. Liver is slightly echogenic. Portal vein is patent on color Doppler imaging with normal direction of blood flow towards the liver. IVC: No abnormality visualized. Pancreas: Visualized portion unremarkable. Spleen: Size and appearance within normal limits. Right Kidney: Length: 9.1 cm. Echogenicity within normal limits. No mass or hydronephrosis visualized. Left Kidney: Length: 11.3 cm. Echogenicity within normal limits. No mass or hydronephrosis visualized. Abdominal aorta: No aneurysm visualized. Distal aorta obscured by bowel gas Other findings: None. IMPRESSION: 1. Negative for hydronephrosis. 2. Interval development of marked gallbladder wall thickening measuring up to 11 mm, but no evidence for shadowing stone. Findings could be  secondary to a calculus cholecystitis versus gallbladder wall thickening secondary to liver disease or edema forming states. Correlation with nuclear medicine hepatobiliary imaging could be considered. 3. Echogenic liver.  Subtle contour nodularity suggesting cirrhosis. Electronically Signed   By: Jasmine Pang M.D.   On: 04/25/2020 18:18   PHYSICAL EXAM  Temp:  [97.5 F (36.4 C)-102.3 F (39.1 C)] 102.3 F (39.1 C) (02/24 0400) Pulse Rate:  [25-141] 114 (02/24 0600) Resp:  [21-31] 31 (02/24 0600) BP: (92-112)/(53-82) 92/58 (02/24 0600) SpO2:  [96 %-100 %] 100 % (02/24 0600) FiO2 (%):  [30 %] 30 % (02/24 0330)  General - Well nourished, well developed, intubated on sedation.  Ophthalmologic - fundi not visualized due to noncooperation.  Cardiovascular - regular rhythm but tachycardia, not in afib.  Neuro -  intubated on sedation, eyes closed, not following commands. With forced eye opening, eyes in mid position, not blinking to visual threat, doll's eyes absent, not tracking, pupil b/l 44mm, sluggish. Corneal reflex weakly present on the left but not on the right, gag and cough present. Breathing over the vent.  Facial symmetry not able to test due to ET tube.  Tongue protrusion not cooperative. On pain stimulation, no movement of all extremities. DTR diminished and no babinski. Sensation, coordination and gait not tested.    ASSESSMENT/PLAN Zachary Woods is a 55 y.o. male with history of previous cryptogenic Left MCA stroke s/p PFO closure, lost to stroke follow up 2018, DM2, HTN, GERD presenting with aphasia, right facial droop and right sided weakness noted upon waking up at 0300 this morning. He slid to the ground from the bed after awakening. EMS was called and he was brought to ED in code stroke status. NIHSS of 23 was noted upon arrival.   Left MCA stroke d/t left MCA branch occlusion with small right frontal SAH - likely due to endocarditis with small mycotic aneurysm  Code Stroke HCT: small single sulcal SAH  CTA- occlusion of a trifurcation branch of the left MCA.  No aneurysm  CTP - Ischemic area 37cc, ischemic core 18ml by CBF<30%, CBV - 10ml  MRI - Moderate to large acute left MCA territory cortical/subcortical infarct affecting the left frontal and parietal lobes, as well as left insula and subinsular region. 1.8 cm acute cortical/subcortical left occipital lobe infarct, a few additional punctate acute infarcts are questioned within the right frontoparietal white matter (versus artifact). Redemonstrated small volume subarachnoid hemorrhage overlying the anterior right frontal lobe.  CT head x 3 - unchanged  2D Echo EF 60-65%. No shunt. Septal Repair:25 mm Amplazter on 08/01/2016 noted.    TEE Large 1x2 cm vegetation associated with the Amplatzer atrial septal occlusion  device  Abdominal US - 04/25/20 - Negative for hydronephrosis. Interval development of marked gallbladder wall thickening. Findings could be secondary to a calculus cholecystitis versus gallbladder wall thickening secondary to liver disease or edema forming states. Correlation with nuclear medicine hepatobiliary imaging could be considered. Echogenic liver. Subtle contour nodularity suggesting cirrhosis.  LDL   HgbA1c 10.5  Beta-2-glycoprotein repeat 94   DVT PPX: SCDS  On ASA 81mg  PTA, now no antiplatelets due to thrombocytopenia and endocarditis with SAH concerning for mycotic aneurysm  Therapy recommendations:  TBD  Disposition:  TBD  Seizure  EEG 2/20 potential epileptogenicity arising from left temporal region as well as moderate diffuse encephalopathy  LTM 2/21 multiple seizures without clinical signs arising from left temporoparietal region   LTM 2/22 two seizures without clinical signs arising  from left temporoparietal region, improving from previous day  LTM 2/23 epileptogenicity arising from left temporoparietal region. Additionally there ismoderate diffuse encephalopathy, nonspecific etiology.  LTM 2/24 multiple seizures without clinical signs arising from left temporoparietal region, one seizure every few hours, lasting 1 to 2 minutes each. Moderate diffuse encephalopathy   On Keppra 500 bid; valproic acid 500 tid and vimpat 100 bid  Antiphospholipid syndrome  07/2016 beta-2 glycoprotein IgG = 57 in the setting of stroke  11/2016 = 90 - 3 months repeat  04/2020 = 94 in the setting of stroke  Will need long term AC once appropriate  afib RVR, newly developed  Developed overnight 2/22  On amiodarone IV  Cardizem IV x 1 ; Metoprolol prn  May need long term AC once appropriate  Hx of stroke PFO closure  07/2016 patient had left MCA small infarct.  MRA head and neck negative.  DVT, TTE, pan CT negative.  TCD bubble study showed a small PFO.  TEE positive  for PFO.  LDL 68 and A1c 5.6.  THC positive.  Hypercoagulable work-up showed beta-2 glycoprotein IgG 57.  Discharged with aspirin.  PFO closure done in 08/2016.  Repeat TTE showed good PFO closure procedure.  He was put on DAPT and Lipitor after PFO closure.  Repeat beta-2 glycoprotein IgG in 11/2016 was 90.  However, patient was lost follow-up with neurology.  Antiphospholipid syndrome was not diagnosed and was not treated.  MSSA bacteremia Septic shock Bacterial meningitis Endocarditis UTI  Tmax 101.3->103->103.5->102.3  Mild leukocytosis 10.8->9.8->7.5->9.6  Lactic acid 7.5->5.8->4.5->2.9 (final)  Antibiotics narrowed to nafcillin and rifampin  B Cx MSSA, serial Blood cultures NGTD  TEE Large 1x2 cm vegetation associated with the Amplatzer atrial septal occlusion device  Urine Cx - MSSA  CSF WBC 90 (Neutro 71%, lymph 29%), RBC 57, protein 117, glucose 104 (blood glucose 300s), Cx NGTD  ID on board  Management per CCM  Respiratory failure  Tachypnea with respiratory distress  Intubated 2/20  Not candidate for extubation  CCM on board   Hx of HTN hypotension . HTN meds at home: Norvasc 10mg  daily, hctz 12.5mg  daily, not ordered. . Hypotension off neo . On MIVF at 150cc/hr -> 75cc -> FW 200 Q2h . Keep BP at least greater than 100 systolic.  Long-term BP goal normotensive  Hyperlipidemia  Home meds: None. Per chart review he stopped statin 2018 d/t myalgia  TG 327   LDL direct 15.3, goal < 70  No statin indicated now given low LDL  Diabetes type II UnControlled  Home meds:  Glucophage 500mg  daily   HgbA1c 10.5, goal < 7.0  Hyperosmolar hyperglycemic on admission  Was on insulin drip -> lantus 30U bid  Persistent hyperglycemia  CBGs  SSI  Close PCP follow up after discharge  Thrombocytopenia  Plt 74->62->45->47->63->76->84  Likely reactive to current critical illness  Hepatitis panel non-reactive  ASA discontinued  HOLD antiplatelet  meds   on heparin subq Q8   AKI  AKI Cre 1.55->1.23->1.62->1.92->2.93->3.45  On FW  CCM on board  2019 abdomen no hydronephrosis  Other Stroke Risk Factors   Former Cigarette smoker  Other Active Problems  Metabolic Acidosis, Initial anion gap 16 with CO2 19, improving with anion gap 12 On sodium bicarb infusion  Hyponatremia->hypernatremia, Na 124->126->135->133->136->140->145->148->149->152 - on FW  Hospital day # 6   This patient is critically ill due to endocarditis, left MCA infarct, meningitis, SAH, AKI, sepsis, afib RVR, seizure and at significant risk of neurological worsening,  death form septic shock, heart failure, recurrent stroke, SAH, renal failure, status epilepticus. This patient's care requires constant monitoring of vital signs, hemodynamics, respiratory and cardiac monitoring, review of multiple databases, neurological assessment, discussion with family, other specialists and medical decision making of high complexity. I spent 35 minutes of neurocritical care time in the care of this patient. I discussed with Dr. Denese Killings.  Marvel Plan, MD PhD Stroke Neurology 04/26/2020 6:40 PM   To contact Stroke Continuity provider, please refer to [email or web address]. After hours, contact General Neurology

## 2020-04-26 NOTE — Progress Notes (Signed)
LTM maintenance completed; checked skin under Fp1, Fp2, F4, and F8- no skin breakdown was seen.

## 2020-04-27 ENCOUNTER — Inpatient Hospital Stay (HOSPITAL_COMMUNITY): Payer: 59

## 2020-04-27 DIAGNOSIS — I6310 Cerebral infarction due to embolism of unspecified precerebral artery: Secondary | ICD-10-CM | POA: Diagnosis not present

## 2020-04-27 DIAGNOSIS — R569 Unspecified convulsions: Secondary | ICD-10-CM | POA: Diagnosis not present

## 2020-04-27 DIAGNOSIS — J9600 Acute respiratory failure, unspecified whether with hypoxia or hypercapnia: Secondary | ICD-10-CM

## 2020-04-27 DIAGNOSIS — J9601 Acute respiratory failure with hypoxia: Secondary | ICD-10-CM

## 2020-04-27 DIAGNOSIS — B9561 Methicillin susceptible Staphylococcus aureus infection as the cause of diseases classified elsewhere: Secondary | ICD-10-CM | POA: Diagnosis not present

## 2020-04-27 DIAGNOSIS — R739 Hyperglycemia, unspecified: Secondary | ICD-10-CM

## 2020-04-27 DIAGNOSIS — G009 Bacterial meningitis, unspecified: Secondary | ICD-10-CM | POA: Diagnosis not present

## 2020-04-27 DIAGNOSIS — I63412 Cerebral infarction due to embolism of left middle cerebral artery: Secondary | ICD-10-CM | POA: Diagnosis not present

## 2020-04-27 DIAGNOSIS — I6340 Cerebral infarction due to embolism of unspecified cerebral artery: Secondary | ICD-10-CM | POA: Diagnosis not present

## 2020-04-27 DIAGNOSIS — R7881 Bacteremia: Secondary | ICD-10-CM | POA: Diagnosis not present

## 2020-04-27 DIAGNOSIS — N179 Acute kidney failure, unspecified: Secondary | ICD-10-CM | POA: Diagnosis not present

## 2020-04-27 DIAGNOSIS — G4089 Other seizures: Secondary | ICD-10-CM

## 2020-04-27 DIAGNOSIS — I639 Cerebral infarction, unspecified: Secondary | ICD-10-CM | POA: Diagnosis not present

## 2020-04-27 LAB — PROTEIN / CREATININE RATIO, URINE
Creatinine, Urine: 35.59 mg/dL
Protein Creatinine Ratio: 1.97 mg/mg{Cre} — ABNORMAL HIGH (ref 0.00–0.15)
Total Protein, Urine: 70 mg/dL

## 2020-04-27 LAB — C DIFFICILE (CDIFF) QUICK SCRN (NO PCR REFLEX)
C Diff antigen: NEGATIVE
C Diff interpretation: NOT DETECTED
C Diff toxin: NEGATIVE

## 2020-04-27 LAB — GLUCOSE, CAPILLARY
Glucose-Capillary: 317 mg/dL — ABNORMAL HIGH (ref 70–99)
Glucose-Capillary: 232 mg/dL — ABNORMAL HIGH (ref 70–99)
Glucose-Capillary: 162 mg/dL — ABNORMAL HIGH (ref 70–99)
Glucose-Capillary: 101 mg/dL — ABNORMAL HIGH (ref 70–99)
Glucose-Capillary: 163 mg/dL — ABNORMAL HIGH (ref 70–99)
Glucose-Capillary: 144 mg/dL — ABNORMAL HIGH (ref 70–99)

## 2020-04-27 LAB — HSV 1/2 AB IGG/IGM CSF
HSV 1/2 Ab Screen IgG, CSF: <0.34 IV (ref <=0.89)
HSV 1/2 Ab, IgM, CSF: 0.32 IV (ref <=0.89)

## 2020-04-27 LAB — COMPREHENSIVE METABOLIC PANEL
ALT: 7 U/L (ref 0–44)
AST: 45 U/L — ABNORMAL HIGH (ref 15–41)
Albumin: 1.1 g/dL — ABNORMAL LOW (ref 3.5–5.0)
Alkaline Phosphatase: 91 U/L (ref 38–126)
Anion gap: 20 — ABNORMAL HIGH (ref 5–15)
BUN: 161 mg/dL — ABNORMAL HIGH (ref 6–20)
CO2: 17 mmol/L — ABNORMAL LOW (ref 22–32)
Calcium: 6.5 mg/dL — ABNORMAL LOW (ref 8.9–10.3)
Chloride: 110 mmol/L (ref 98–111)
Creatinine, Ser: 4.42 mg/dL — ABNORMAL HIGH (ref 0.61–1.24)
GFR, Estimated: 15 mL/min — ABNORMAL LOW (ref >60)
Glucose, Bld: 361 mg/dL — ABNORMAL HIGH (ref 70–99)
Potassium: 3.9 mmol/L (ref 3.5–5.1)
Sodium: 147 mmol/L — ABNORMAL HIGH (ref 135–145)
Total Bilirubin: 6.4 mg/dL — ABNORMAL HIGH (ref 0.3–1.2)
Total Protein: 5.1 g/dL — ABNORMAL LOW (ref 6.5–8.1)

## 2020-04-27 LAB — RENAL FUNCTION PANEL
Albumin: 1.1 g/dL — ABNORMAL LOW (ref 3.5–5.0)
Anion gap: 21 — ABNORMAL HIGH (ref 5–15)
BUN: 159 mg/dL — ABNORMAL HIGH (ref 6–20)
CO2: 16 mmol/L — ABNORMAL LOW (ref 22–32)
Calcium: 6.6 mg/dL — ABNORMAL LOW (ref 8.9–10.3)
Chloride: 109 mmol/L (ref 98–111)
Creatinine, Ser: 4.34 mg/dL — ABNORMAL HIGH (ref 0.61–1.24)
GFR, Estimated: 15 mL/min — ABNORMAL LOW (ref >60)
Glucose, Bld: 174 mg/dL — ABNORMAL HIGH (ref 70–99)
Phosphorus: 9.4 mg/dL — ABNORMAL HIGH (ref 2.5–4.6)
Potassium: 3.8 mmol/L (ref 3.5–5.1)
Sodium: 146 mmol/L — ABNORMAL HIGH (ref 135–145)

## 2020-04-27 LAB — IRON AND TIBC
Iron: 11 ug/dL — ABNORMAL LOW (ref 45–182)
Saturation Ratios: 6 % — ABNORMAL LOW (ref 17.9–39.5)
TIBC: 176 ug/dL — ABNORMAL LOW (ref 250–450)
UIBC: 165 ug/dL

## 2020-04-27 LAB — CBC WITH DIFFERENTIAL/PLATELET
Abs Immature Granulocytes: 0.23 10*3/uL — ABNORMAL HIGH (ref 0.00–0.07)
Basophils Absolute: 0 10*3/uL (ref 0.0–0.1)
Basophils Relative: 0 %
Eosinophils Absolute: 0 10*3/uL (ref 0.0–0.5)
Eosinophils Relative: 0 %
HCT: 26 % — ABNORMAL LOW (ref 39.0–52.0)
Hemoglobin: 9.3 g/dL — ABNORMAL LOW (ref 13.0–17.0)
Immature Granulocytes: 2 %
Lymphocytes Relative: 8 %
Lymphs Abs: 1 10*3/uL (ref 0.7–4.0)
MCH: 34.2 pg — ABNORMAL HIGH (ref 26.0–34.0)
MCHC: 35.8 g/dL (ref 30.0–36.0)
MCV: 95.6 fL (ref 80.0–100.0)
Monocytes Absolute: 0.4 10*3/uL (ref 0.1–1.0)
Monocytes Relative: 3 %
Neutro Abs: 10.8 10*3/uL — ABNORMAL HIGH (ref 1.7–7.7)
Neutrophils Relative %: 87 %
Platelets: 92 10*3/uL — ABNORMAL LOW (ref 150–400)
RBC: 2.72 MIL/uL — ABNORMAL LOW (ref 4.22–5.81)
RDW: 15.7 % — ABNORMAL HIGH (ref 11.5–15.5)
WBC: 12.8 10*3/uL — ABNORMAL HIGH (ref 4.0–10.5)
nRBC: 0.4 % — ABNORMAL HIGH (ref 0.0–0.2)

## 2020-04-27 LAB — FERRITIN: Ferritin: 1861 ng/mL — ABNORMAL HIGH (ref 24–336)

## 2020-04-27 LAB — PROTIME-INR
INR: 1.5 — ABNORMAL HIGH (ref 0.8–1.2)
Prothrombin Time: 17.1 seconds — ABNORMAL HIGH (ref 11.4–15.2)

## 2020-04-27 MED ORDER — CALCIUM GLUCONATE-NACL 1-0.675 GM/50ML-% IV SOLN
1.0000 g | Freq: Once | INTRAVENOUS | Status: AC
  Administered 2020-04-27: 1000 mg via INTRAVENOUS
  Filled 2020-04-27: qty 50

## 2020-04-27 MED ORDER — PRISMASOL BGK 4/2.5 32-4-2.5 MEQ/L REPLACEMENT SOLN
Status: DC
  Filled 2020-04-27 (×7): qty 5000

## 2020-04-27 MED ORDER — FREE WATER
100.0000 mL | Status: DC
  Administered 2020-04-27 – 2020-04-29 (×9): 100 mL

## 2020-04-27 MED ORDER — PRISMASOL BGK 4/2.5 32-4-2.5 MEQ/L REPLACEMENT SOLN
Status: DC
  Filled 2020-04-27 (×5): qty 5000

## 2020-04-27 MED ORDER — PRISMASOL BGK 4/2.5 32-4-2.5 MEQ/L EC SOLN
Status: DC
  Filled 2020-04-27 (×13): qty 5000

## 2020-04-27 MED ORDER — LEVETIRACETAM IN NACL 1000 MG/100ML IV SOLN
1000.0000 mg | Freq: Two times a day (BID) | INTRAVENOUS | Status: DC
  Administered 2020-04-27 – 2020-04-30 (×6): 1000 mg via INTRAVENOUS
  Filled 2020-04-27 (×6): qty 100

## 2020-04-27 MED ORDER — HEPARIN SODIUM (PORCINE) 1000 UNIT/ML DIALYSIS
1000.0000 [IU] | INTRAMUSCULAR | Status: DC | PRN
  Filled 2020-04-27: qty 6

## 2020-04-27 MED ORDER — DILTIAZEM HCL 25 MG/5ML IV SOLN
5.0000 mg | Freq: Once | INTRAVENOUS | Status: AC
  Administered 2020-04-27: 5 mg via INTRAVENOUS
  Filled 2020-04-27: qty 5

## 2020-04-27 MED ORDER — INSULIN GLARGINE 100 UNIT/ML ~~LOC~~ SOLN
40.0000 [IU] | Freq: Two times a day (BID) | SUBCUTANEOUS | Status: DC
  Administered 2020-04-27 (×2): 40 [IU] via SUBCUTANEOUS
  Filled 2020-04-27 (×4): qty 0.4

## 2020-04-27 MED ORDER — NOREPINEPHRINE 16 MG/250ML-% IV SOLN
0.0000 ug/min | INTRAVENOUS | Status: DC
  Administered 2020-04-27 (×2): 25 ug/min via INTRAVENOUS
  Administered 2020-04-27: 14 ug/min via INTRAVENOUS
  Administered 2020-04-28: 52 ug/min via INTRAVENOUS
  Administered 2020-04-28: 56 ug/min via INTRAVENOUS
  Administered 2020-04-28: 52.053 ug/min via INTRAVENOUS
  Administered 2020-04-28: 30 ug/min via INTRAVENOUS
  Administered 2020-04-29 (×2): 38 ug/min via INTRAVENOUS
  Administered 2020-04-29: 46 ug/min via INTRAVENOUS
  Administered 2020-04-29: 60 ug/min via INTRAVENOUS
  Filled 2020-04-27 (×12): qty 250

## 2020-04-27 NOTE — Progress Notes (Addendum)
[facility] Infectious Diseases Follow Up Note  Patient Identification: Patient Name: Zachary Woods MRN: [id number] Admit Date: 04/14/2020  4:07 AM Age: 55 y.o.Today's Date: 04/27/2020   Reason for Visit: MSSA bacteremia   Principal Problem:   MSSA bacteremia Active Problems:   PFO (patent foramen ovale)   Acute CVA (cerebrovascular accident) (HCC)   Subarachnoid hemorrhage (HCC)   Type 2 diabetes mellitus with hyperosmolar hyperglycemic state (HHS) (HCC)   AKI (acute kidney injury) (HCC)   SIRS (systemic inflammatory response syndrome) (HCC)   Metabolic acidosis with increased anion gap and accumulation of organic acids   Hypomagnesemia   Hypokalemia   Thrombocytopenia (HCC)   Stroke (HCC)   Suspected endocarditis  Antibiotics : Nafcillin 2/22 -  Rifampin 2/22 - Gentamicin 2/22 -2/23  Total days of antibiotics 8   Lines/tubes : PIVs, external urethral catheter, rectal tube , PIVs  Interval Events: remains intubated, sedated, on 2 pressors, febrile, uptrending leukocytosis and Cr   Assessment # Septic Shock  #MSSA bacteremia/PV Endocarditis ( Prosthetic Device)  In the setting ofLarge 1x2 cm vegetation associated with the Amplatzer atrial septal occlusion device, which appears to extend to the posterior mitral leaflet.  # Acute bacterial meningitis  # Acute Respiratory failure  #AcuteLeft MCAIschemic Infarct( left frontal, parietal and occipital lobes) #Rt frontal SAH ? Concerns for septic emboli and mycotic aneurysm #Thrombocytopenia- in the setting of sepsis/endocarditis  # Worsening AKI  # Seizures # Liver Cirrhosis - LFTs appear stable    Recommendations Continue Nafcillin and Rifampin as is. Low threshold to change nafcillin to cefazolin if Cr continues to worse. Recommend Nephrology consult  Will hold gentamicin for now given  worsening AKI. Will get a C diff Fu blood cultures  Monitor CBC, CMP  Vent management per CCM   Dr Earlene Plater is available over the weekend. New ID team will follow on Monday.   Rest of the management as per the primary team. Thank you for the consult. Please page with pertinent questions or concerns.  ______________________________________________________________________ Subjective patient seen and examined at the bedside. Clinically getting worse as above, watery stools noted   Vitals BP (!) 93/56   Pulse (!) 137   Temp (!) 103.2 F (39.6 C) (Oral) Comment: Nurse notified  Resp (!) 25   Ht 5\' 6"  (1.676 m)   Wt 87.6 kg   SpO2 96%   BMI 31.17 kg/m     Physical Exam Intubated, non responsive HEENT - injected conunctiva Chest - vent sounds, coarse CVS - Normal s1s2, RRR Abdomen - soft, BS+ Extremities - edematous, anasarca  Skin - multiple non blanching erythematous lesions in bilateral hand and feet( appears to be stable)  Pertinent Microbiology Results for orders placed or performed during the hospital encounter of 04/29/2020  Resp Panel by RT-PCR (Flu A&B, Covid) Nasopharyngeal Swab     Status: None   Collection Time: 04/11/2020  4:20 AM   Specimen: Nasopharyngeal Swab; Nasopharyngeal(NP) swabs in vial transport medium  Result Value Ref Range Status   SARS Coronavirus 2 by RT PCR NEGATIVE NEGATIVE Final    Comment: (NOTE) SARS-CoV-2 target nucleic acids are NOT DETECTED.  The SARS-CoV-2 RNA is generally detectable in upper respiratory specimens during the acute phase of infection. The lowest concentration of SARS-CoV-2 viral copies this assay can detect is 138 copies/mL. A negative result does not preclude SARS-Cov-2 infection and should not be used as the sole basis for treatment or other patient management decisions. A negative result may occur with  improper specimen collection/handling, submission of specimen other than nasopharyngeal swab, presence of viral  mutation(s) within the areas targeted by this assay, and inadequate number of viral copies(<138 copies/mL). A negative result must be combined with clinical observations, patient history, and epidemiological information. The expected result is Negative.  Fact Sheet for Patients:  BloggerCourse.com  Fact Sheet for Healthcare Providers:  SeriousBroker.it  This test is no t yet approved or cleared by the [place] FDA and  has been authorized for detection and/or diagnosis of SARS-CoV-2 by FDA under an Emergency Use Authorization (EUA). This EUA will remain  in effect (meaning this test can be used) for the duration of the COVID-19 declaration under Section 564(b)(1) of the Act, 21 U.S.C.section 360bbb-3(b)(1), unless the authorization is terminated  or revoked sooner.       Influenza A by PCR NEGATIVE NEGATIVE Final   Influenza B by PCR NEGATIVE NEGATIVE Final    Comment: (NOTE) The Xpert Xpress SARS-CoV-2/FLU/RSV plus assay is intended as an aid in the diagnosis of influenza from Nasopharyngeal swab specimens and should not be used as a sole basis for treatment. Nasal washings and aspirates are unacceptable for Xpert Xpress SARS-CoV-2/FLU/RSV testing.  Fact Sheet for Patients: BloggerCourse.com  Fact Sheet for Healthcare Providers: SeriousBroker.it  This test is not yet approved or cleared by the [place] FDA and has been authorized for detection and/or diagnosis of SARS-CoV-2 by FDA under an Emergency Use Authorization (EUA). This EUA will remain in effect (meaning this test can be used) for the duration of the COVID-19 declaration under Section 564(b)(1) of the Act, 21 U.S.C. section 360bbb-3(b)(1), unless the authorization is terminated or revoked.  Performed at [facility], [address] [place], [place] [address]   Culture, blood (routine x 2)      Status: Abnormal   Collection Time: 04/04/2020  5:15 PM   Specimen: Site Not Specified; Blood  Result Value Ref Range Status   Specimen Description SITE NOT SPECIFIED  Final   Special Requests   Final    BOTTLES DRAWN AEROBIC AND ANAEROBIC Blood Culture adequate volume   Culture  Setup Time   Final    IN BOTH AEROBIC AND ANAEROBIC BOTTLES GRAM POSITIVE COCCI CRITICAL RESULT CALLED TO, READ BACK BY AND VERIFIED WITH: J MILLEN PHARMD  04/21/20 EB Performed at [facility], [address], [place], [place] [address]    Culture STAPHYLOCOCCUS AUREUS (A)  Final   Report Status 04/25/2020 FINAL  Final   Organism ID, Bacteria STAPHYLOCOCCUS AUREUS  Final      Susceptibility   Staphylococcus aureus - MIC*    CIPROFLOXACIN <=0.5 SENSITIVE Sensitive     ERYTHROMYCIN <=0.25 SENSITIVE Sensitive     GENTAMICIN <=0.5 SENSITIVE Sensitive     OXACILLIN 0.5 SENSITIVE Sensitive     TETRACYCLINE >=16 RESISTANT Resistant     VANCOMYCIN <=0.5 SENSITIVE Sensitive     TRIMETH/SULFA <=10 SENSITIVE Sensitive     CLINDAMYCIN <=0.25 SENSITIVE Sensitive     RIFAMPIN <=0.5 SENSITIVE Sensitive     Inducible Clindamycin NEGATIVE Sensitive     * STAPHYLOCOCCUS AUREUS  Blood Culture ID Panel (Reflexed)     Status: Abnormal   Collection Time: 04/19/2020  5:15 PM  Result Value Ref Range Status   Enterococcus faecalis NOT DETECTED NOT DETECTED Final   Enterococcus Faecium NOT DETECTED NOT DETECTED Final   Listeria monocytogenes NOT DETECTED NOT DETECTED Final   Staphylococcus species DETECTED (A) NOT DETECTED Final  Comment: CRITICAL RESULT CALLED TO, READ BACK BY AND VERIFIED WITH: J MILLEN PHARMD @[id number]  04/21/20 EB    Staphylococcus aureus (BCID) DETECTED (A) NOT DETECTED Final    Comment: CRITICAL RESULT CALLED TO, READ BACK BY AND VERIFIED WITH: J MILLEN PHARMD @0854  04/21/20 EB    Staphylococcus epidermidis NOT DETECTED NOT DETECTED Final   Staphylococcus lugdunensis NOT DETECTED NOT DETECTED Final    Streptococcus species NOT DETECTED NOT DETECTED Final   Streptococcus agalactiae NOT DETECTED NOT DETECTED Final   Streptococcus pneumoniae NOT DETECTED NOT DETECTED Final   Streptococcus pyogenes NOT DETECTED NOT DETECTED Final   A.calcoaceticus-baumannii NOT DETECTED NOT DETECTED Final   Bacteroides fragilis NOT DETECTED NOT DETECTED Final   Enterobacterales NOT DETECTED NOT DETECTED Final   Enterobacter cloacae complex NOT DETECTED NOT DETECTED Final   Escherichia coli NOT DETECTED NOT DETECTED Final   Klebsiella aerogenes NOT DETECTED NOT DETECTED Final   Klebsiella oxytoca NOT DETECTED NOT DETECTED Final   Klebsiella pneumoniae NOT DETECTED NOT DETECTED Final   Proteus species NOT DETECTED NOT DETECTED Final   Salmonella species NOT DETECTED NOT DETECTED Final   Serratia marcescens NOT DETECTED NOT DETECTED Final   Haemophilus influenzae NOT DETECTED NOT DETECTED Final   Neisseria meningitidis NOT DETECTED NOT DETECTED Final   Pseudomonas aeruginosa NOT DETECTED NOT DETECTED Final   Stenotrophomonas maltophilia NOT DETECTED NOT DETECTED Final   Candida albicans NOT DETECTED NOT DETECTED Final   Candida auris NOT DETECTED NOT DETECTED Final   Candida glabrata NOT DETECTED NOT DETECTED Final   Candida krusei NOT DETECTED NOT DETECTED Final   Candida parapsilosis NOT DETECTED NOT DETECTED Final   Candida tropicalis NOT DETECTED NOT DETECTED Final   Cryptococcus neoformans/gattii NOT DETECTED NOT DETECTED Final   Meth resistant mecA/C and MREJ NOT DETECTED NOT DETECTED Final    Comment: Performed at [facility], [address] [place], [address] [place]  MRSA PCR Screening     Status: None   Collection Time: 04/08/2020  7:36 PM   Specimen: Nasal Mucosa; Nasopharyngeal  Result Value Ref Range Status   MRSA by PCR NEGATIVE NEGATIVE Final    Comment:        The GeneXpert MRSA Assay (FDA approved for NASAL specimens only), is one component of a comprehensive MRSA  colonization surveillance program. It is not intended to diagnose MRSA infection nor to guide or monitor treatment for MRSA infections. Performed at [facility], [address], [place], [address] [place]   Culture, blood (Routine X 2) w Reflex to ID Panel     Status: None   Collection Time: 04/15/2020  9:20 PM   Specimen: BLOOD LEFT HAND  Result Value Ref Range Status   Specimen Description BLOOD LEFT HAND  Final   Special Requests AEROBIC BOTTLE ONLY Blood Culture adequate volume  Final   Culture   Final    NO GROWTH 5 DAYS Performed at [facility], [address] [place], [address] [place]    Report Status 04/25/2020 FINAL  Final  Culture, blood (routine x 2)     Status: None   Collection Time: 04/13/2020  9:27 PM   Specimen: BLOOD RIGHT HAND  Result Value Ref Range Status   Specimen Description BLOOD RIGHT HAND  Final   Special Requests AEROBIC BOTTLE ONLY Blood Culture adequate volume  Final   Culture   Final    NO GROWTH 5 DAYS Performed at [facility], [address], [place], [place] [address]    Report Status 04/25/2020 FINAL  Final  Urine Culture     Status: Abnormal   Collection Time: 04/21/20  5:48 AM   Specimen: Urine, Clean Catch  Result Value Ref Range Status   Specimen Description URINE, CLEAN CATCH  Final   Special Requests   Final    NONE Performed at [facility], [address], [place], [place] [address]    Culture 50,000 COLONIES/mL STAPHYLOCOCCUS AUREUS (A)  Final   Report Status 04/15/2020 FINAL  Final   Organism ID, Bacteria STAPHYLOCOCCUS AUREUS (A)  Final      Susceptibility   Staphylococcus aureus - MIC*    CIPROFLOXACIN <=0.5 SENSITIVE Sensitive     GENTAMICIN <=0.5 SENSITIVE Sensitive     NITROFURANTOIN <=16 SENSITIVE Sensitive     OXACILLIN <=0.25 SENSITIVE Sensitive     TETRACYCLINE >=16 RESISTANT Resistant     VANCOMYCIN <=0.5 SENSITIVE Sensitive     TRIMETH/SULFA <=10 SENSITIVE Sensitive     CLINDAMYCIN  <=0.25 SENSITIVE Sensitive     RIFAMPIN <=0.5 SENSITIVE Sensitive     Inducible Clindamycin NEGATIVE Sensitive     * 50,000 COLONIES/mL STAPHYLOCOCCUS AUREUS  Culture, blood (routine x 2)     Status: Abnormal   Collection Time: 04/21/20 10:42 AM   Specimen: BLOOD RIGHT HAND  Result Value Ref Range Status   Specimen Description BLOOD RIGHT HAND  Final   Special Requests   Final    AEROBIC BOTTLE ONLY Blood Culture results may not be optimal due to an inadequate volume of blood received in culture bottles   Culture  Setup Time   Final    AEROBIC BOTTLE ONLY GRAM POSITIVE COCCI CRITICAL RESULT CALLED TO, READ BACK BY AND VERIFIED WITH: V BRYK PHARMD 04/24/20 2301 JDW    Culture (A)  Final    STAPHYLOCOCCUS AUREUS SUSCEPTIBILITIES PERFORMED ON PREVIOUS CULTURE WITHIN THE LAST 5 DAYS. Performed at [facility], [address] [place], [place] [address]    Report Status 04/25/2020 FINAL  Final  Culture, blood (routine x 2)     Status: None   Collection Time: 04/21/20 10:52 AM   Specimen: BLOOD RIGHT HAND  Result Value Ref Range Status   Specimen Description BLOOD RIGHT HAND  Final   Special Requests   Final    AEROBIC BOTTLE ONLY Blood Culture results may not be optimal due to an inadequate volume of blood received in culture bottles   Culture   Final    NO GROWTH 5 DAYS Performed at [facility], [address], [place], [place] [address]    Report Status 04/26/2020 FINAL  Final  Culture, blood (routine x 2)     Status: None (Preliminary result)   Collection Time: 04/24/2020 10:01 AM   Specimen: BLOOD LEFT HAND  Result Value Ref Range Status   Specimen Description BLOOD LEFT HAND  Final   Special Requests   Final    BOTTLES DRAWN AEROBIC ONLY Blood Culture results may not be optimal due to an excessive volume of blood received in culture bottles   Culture   Final    NO GROWTH 4 DAYS Performed at [facility], [address], [place], [place] [address]    Report  Status PENDING  Incomplete  HSV 1/2 Ab IgG/IgM CSF     Status: None   Collection Time: 04/16/2020 11:26 AM   Specimen: Lumbar Puncture; Cerebrospinal Fluid  Result Value Ref  Range Status   HSV 1/2 Ab, IgM, CSF 0.32 <=0.89 IV Final    Comment: (NOTE) INTERPRETIVE INFORMATION: Herpes Simplex Virus                          Type 1 and/or 2 Antibodies,                          IgM by ELISA, CSF  0.89 IV or Less .......... Negative: No significant                             level of detectable HSV IgM                             antibody.  0.90 - 1.09 IV ........... Equivocal: Questionable                             presence of IgM antibodies.                             Repeat testing in 10-14 days                             may be helpful.  1.10 IV or Greater ....... Positive: IgM antibody to HSV                             detected, which may indicate a                             current or recent infection.                             However, low levels of IgM                             antibodies may occasionally                             persist for more than 12                             months post-infection. The detection of antibodies to herpes simplex virus in CSF may indicate central nervou s system infection. However, consideration must be given to possible contamination by blood or transfer of serum antibodies across the blood-brain barrier. Fourfold or greater rise in CSF antibodies to herpes on specimens at least 4 weeks apart are found in 74-94 % of patients with herpes encephalitis. Specificity of the test based on a single CSF testing is not established. Presently PCR is the primary means of establishing a diagnosis of herpes encephalitis. This test was developed and its performance characteristics determined by [facility]. It has not been cleared or approved by the [place] Food and Drug Administration. This test was performed in a CLIA certified laboratory  and is intended for clinical purposes.    HSV 1/2 Ab Screen IgG, CSF <0.34 <=  0.89 IV Final    Comment: (NOTE) INTERPRETIVE INFORMATION: Herpes Simplex Virus Type 1 and/or 2                    Antibodies, IgG CSF  0.89 IV or Less .......... Negative: No significant                             level of detectable HSV IgG                             antibody.  0.90 - 1.09 IV ........... Equivocal: Questionable                             presence of IgG antibodies.                             Repeat testing in 10-14 days                             may be helpful.  1.10 IV or Greater ....... Positive: IgG antibody to HSV                             detected, which may indicate                             a current or past HSV                             infection. The detection of antibodies to herpes simplex virus in CSF may indicate central nervous system infection. However, consideration must be given to possible contamination by blood or transfer of serum antibodies across the blood-brain barrier. Fourfold or greater rise in CSF antibodies to herpes on specimens at l east 4 weeks apart are found in 74-94 % of patients with herpes encephalitis. Specificity of the test based on a single CSF testing is not established. Presently PCR is the primary means of establishing a diagnosis of herpes encephalitis. This test was developed and its performance characteristics determined by [facility]. It has not been cleared or approved by the [place] Food and Drug Administration. This test was performed in a CLIA certified laboratory and is intended for clinical purposes. Performed At: [facility] [address] [place], [place] [id number] Elinor ParkinsonGeorge Tracy I MD NW:[id number]:[phone]   CSF culture     Status: None   Collection Time: 12/09/20 11:42 AM   Specimen: CSF; Cerebrospinal Fluid  Result Value Ref Range Status   Specimen Description CSF  Final   Special Requests LUMBAR  Final    Gram Stain   Final    CYTOSPIN SMEAR WBC PRESENT, PREDOMINANTLY MONONUCLEAR NO ORGANISMS SEEN    Culture   Final    NO GROWTH 3 DAYS Performed at [facility], [address] [place], [place] [address]    Report Status 04/26/2020 FINAL  Final  Culture, blood (routine x 2)     Status: None (Preliminary result)   Collection Time: 04/26/20  3:42 PM   Specimen: BLOOD LEFT HAND  Result Value Ref Range Status  Specimen Description BLOOD LEFT HAND  Final   Special Requests   Final    BOTTLES DRAWN AEROBIC ONLY Blood Culture adequate volume   Culture   Final    NO GROWTH < 24 HOURS Performed at [facility], [address] [place], [place] [address]    Report Status PENDING  Incomplete  Culture, blood (routine x 2)     Status: None (Preliminary result)   Collection Time: 04/26/20  3:42 PM   Specimen: BLOOD RIGHT HAND  Result Value Ref Range Status   Specimen Description BLOOD RIGHT HAND  Final   Special Requests   Final    BOTTLES DRAWN AEROBIC ONLY Blood Culture adequate volume   Culture   Final    NO GROWTH < 24 HOURS Performed at [facility], [address] [place], [place] [address]    Report Status PENDING  Incomplete  C Difficile Quick Screen (NO PCR Reflex)     Status: None   Collection Time: 04/27/20 11:50 AM   Specimen: STOOL  Result Value Ref Range Status   C Diff antigen NEGATIVE NEGATIVE Final   C Diff toxin NEGATIVE NEGATIVE Final   C Diff interpretation No C. difficile detected.  Final    Comment: Performed at [facility], [address] [place], [place] [address]    Pertinent Lab. CBC Latest Ref Rng & Units 04/27/2020 04/26/2020 04/26/2020  WBC 4.0 - 10.5 K/uL 12.8(H) - 9.6  Hemoglobin 13.0 - 17.0 g/dL 9.1(Y) 7.8(G) 10.4(L)  Hematocrit 39.0 - 52.0 % 26.0(L) 28.0(L) 29.9(L)  Platelets 150 - 400 K/uL 92(L) - 84(L)   CMP Latest Ref Rng & Units 04/27/2020 04/26/2020 04/26/2020  Glucose 70 - 99 mg/dL 956(O) - 130(Q)  BUN 6 - 20 mg/dL  657(Q) - 469(G)  Creatinine 0.61 - 1.24 mg/dL 2.95(M) - 8.41(L)  Sodium 135 - 145 mmol/L 147(H) 151(H) 152(H)  Potassium 3.5 - 5.1 mmol/L 3.9 4.0 3.7  Chloride 98 - 111 mmol/L 110 - 115(H)  CO2 22 - 32 mmol/L 17(L) - 18(L)  Calcium 8.9 - 10.3 mg/dL 2.4(M) - 7.4(L)  Total Protein 6.5 - 8.1 g/dL 5.1(L) - 5.0(L)  Total Bilirubin 0.3 - 1.2 mg/dL 6.4(H) - 6.8(H)  Alkaline Phos 38 - 126 U/L 91 - 96  AST 15 - 41 U/L 45(H) - 53(H)  ALT 0 - 44 U/L 7 - <5    Pertinent Imaging today Plain films and CT images have been personally visualized and interpreted; radiology reports have been reviewed. Decision making incorporated into the Impression / Recommendations.  I have spent approx 30 minutes for this patient encounter including review of prior medical records with greater than 50% of time being face to face and coordination of their care.  Electronically signed by:   Odette Fraction, MD Infectious Disease Physician [facility] for Infectious Disease Pager: [phone]

## 2020-04-27 NOTE — Consult Note (Addendum)
[facility] Renal Consultation Note  Requesting MD: Durel Salts, MD Indication for Consultation:  AKI   Chief complaint: right sided weakness on presentation  HPI:  Zachary Woods is a 55 y.o. male with a history of diabetes, hypertension, CVA, and GERD who presented to the hospital with right sided weakness on 2/18.  He was found to have acute stroke - left MCA.  Course has been complicated by MSSA bacteremia and endocarditis.  Per report felt to have developed vegetation on closure device for a PFO placed a few years ago.  He has been hypotensive requiring levo and vasopressin.  Course has also been complicated by acute kidney injury with a BUN of up to 161 and Cr 4.42 today.  Nephrology is consulted for assistance with management of AKI.  Got CT angio on 2/18.  Home meds include ibuprofen, meloxicam and HCTZ.  Renal US with no hydro and normal echogenicity.  Noted incidental finding of contour nodularity of liver suggesting cirrhosis.  He had 725 mL Uop over 2/24.  He has been on levo at 25 mcg/min and vaso.  Spoke with his wife via phone and discussed risks/benefits/indications for renal replacement therapy and she consented for renal replacement therapy.   Creatinine, Ser  Date/Time Value Ref Range Status  04/27/2020 04:15 AM 4.42 (H) 0.61 - 1.24 mg/dL Final  81/27/5170 01:74 AM 3.45 (H) 0.61 - 1.24 mg/dL Final  94/49/6759 16:38 AM 2.93 (H) 0.61 - 1.24 mg/dL Final    Comment:    DELTA CHECK NOTED  04/24/2020 05:56 AM 1.92 (H) 0.61 - 1.24 mg/dL Final  46/65/9935 70:17 AM 1.62 (H) 0.61 - 1.24 mg/dL Final  79/39/0300 92:33 AM 1.19 0.61 - 1.24 mg/dL Final  00/76/2263 33:54 AM 1.24 0.61 - 1.24 mg/dL Final  56/25/6389 37:34 PM 1.23 0.61 - 1.24 mg/dL Final  28/76/8115 72:62 PM 1.53 (H) 0.61 - 1.24 mg/dL Final  03/55/9741 63:84 AM 1.20 0.61 - 1.24 mg/dL Final  53/64/6803 21:22 AM 1.55 (H) 0.61 - 1.24 mg/dL Final  48/25/0037 04:88 PM 1.07 0.76 - 1.27 mg/dL Final  89/16/9450  38:88 PM 0.75 (L) 0.76 - 1.27 mg/dL Final  28/00/3491 79:15 AM 0.74 (L) 0.76 - 1.27 mg/dL Final  05/69/7948 01:65 PM 0.78 0.76 - 1.27 mg/dL Final  53/74/8270 78:67 AM 0.70 0.61 - 1.24 mg/dL Final  54/49/2010 07:12 AM 0.70 0.61 - 1.24 mg/dL Final  19/75/8832 54:98 AM 0.72 0.61 - 1.24 mg/dL Final     PMHx:   Past Medical History:  Diagnosis Date  . Acid reflux   . CVA (cerebral vascular accident) (HCC)   . Diabetes mellitus, type 2 (HCC)   . Hypertension     Past Surgical History:  Procedure Laterality Date  . PATENT FORAMEN OVALE(PFO) CLOSURE N/A 08/01/2016   Procedure: Patent [place]ament [place](PFO) Closure;  Surgeon: Tonny Bollman, MD;  Location: [facility] INVASIVE CV LAB;  Service: Cardiovascular;  Laterality: N/A;  . TEE WITHOUT CARDIOVERSION N/A 07/21/2016   Procedure: TRANSESOPHAGEAL ECHOCARDIOGRAM (TEE);  Surgeon: Lars Masson, MD;  Location[facility] ENDOSCOPY;  Service: Cardiovascular;  Laterality: N/A;    Family Hx:  Family History  Problem Relation Age of Onset  . Diabetes Father   . Diabetes Brother     Social History:  reports that he has quit smoking. He has never used smokeless tobacco. He reports current alcohol use of about 1.0 standard drink of alcohol per week. He reports that he does not use drugs. obtained per chart review  Allergies: No Active Allergies  Medications: Prior to Admission medications   Medication Sig Start Date End Date Taking? Authorizing Provider  amLODipine (NORVASC) 10 MG tablet Take 1 tablet (10 mg total) by mouth daily. 02/04/18  Yes Shade Flood, MD  aspirin EC 81 MG tablet Take 1 tablet (81 mg total) by mouth daily. 08/05/17  Yes Janetta Hora, PA-C  ibuprofen (ADVIL) 200 MG tablet Take 400 mg by mouth every 6 (six) hours as needed for headache or mild pain.   Yes [provider]  meloxicam (MOBIC) 7.5 MG tablet Take 7.5 mg by mouth daily. 06/17/19  Yes [provider]  metFORMIN (GLUCOPHAGE) 500 MG tablet Take 1  tablet (500 mg total) by mouth daily with breakfast. 08/04/19  Yes Shade Flood, MD  Multiple Vitamin (MULTIVITAMIN WITH MINERALS) TABS tablet Take 1 tablet by mouth daily.   Yes [provider]  omeprazole (PRILOSEC OTC) 20 MG tablet Take 20 mg by mouth daily.   Yes [provider]  pantoprazole (PROTONIX) 40 MG tablet TAKE 1 TABLET BY MOUTH EVERY DAY Patient taking differently: Take 40 mg by mouth daily. 03/25/17  Yes Tonny Bollman, MD  sildenafil (VIAGRA) 100 MG tablet Take 0.5-1 tablets (50-100 mg total) by mouth daily as needed for erectile dysfunction. 11/27/16  Yes Shade Flood, MD  Tetrahydrozoline HCl (VISINE OP) Place 1 drop into both eyes daily as needed (dry eyes).   Yes [provider]  hydrochlorothiazide (MICROZIDE) 12.5 MG capsule TAKE 1 CAPSULE BY MOUTH EVERY DAY Patient not taking: No sig reported 06/23/18   Shade Flood, MD    I have reviewed the patient's current and reported prior to admission medications.  Labs:  BMP Latest Ref Rng & Units 04/27/2020 04/26/2020 04/26/2020  Glucose 70 - 99 mg/dL 098(J) - 191(Y)  BUN 6 - 20 mg/dL 782(N) - 562(Z)  Creatinine 0.61 - 1.24 mg/dL 3.08(M) - 5.78(I)  BUN/Creat Ratio 9 - 20 - - -  Sodium 135 - 145 mmol/L 147(H) 151(H) 152(H)  Potassium 3.5 - 5.1 mmol/L 3.9 4.0 3.7  Chloride 98 - 111 mmol/L 110 - 115(H)  CO2 22 - 32 mmol/L 17(L) - 18(L)  Calcium 8.9 - 10.3 mg/dL 6.9(G) - 7.4(L)    Urinalysis    Component Value Date/Time   COLORURINE YELLOW 04/19/2020 1955   APPEARANCEUR HAZY (A) 04/17/2020 1955   LABSPEC 1.035 (H) 04/10/2020 1955   PHURINE 6.0 04/28/2020 1955   GLUCOSEU >=500 (A) 04/09/2020 1955   HGBUR LARGE (A) 04/12/2020 1955   BILIRUBINUR NEGATIVE 04/16/2020 1955   KETONESUR 5 (A) 04/25/2020 1955   PROTEINUR 30 (A) 04/21/2020 1955   NITRITE POSITIVE (A) 04/21/2020 1955   LEUKOCYTESUR SMALL (A) 04/14/2020 1955     ROS: Unable to obtain secondary to intubated   Physical  Exam: Vitals:   04/27/20 0800 04/27/20 1122  BP:  (!) 93/56  Pulse:  (!) 137  Resp:  (!) 25  Temp: (!) 103.2 F (39.6 C)   SpO2:       General:  Adult male intubated  HEENT: NCAT eeg monitoring in place Eyes: closed; does not open to exam  Neck: trachea midline; normal circumference Heart: S1S2 no rub; tachycardic Lungs: coarse mechanical breath sounds Abdomen: softly distended  Extremities: trace lower extremity edema and 1+ upper extremity edema Skin: skin discoloration of toes  Neuro: sedation running  GU scrotal edema; external catheter in place  Assessment/Plan:  # AKI  - Secondary to ischemic  and pre-renal ATN with acute stroke, shock, contrast.  May have also had septic emboli to kidneys.  Baseline Cr around 1. Renal US no hydro, normal echogenicity.  UA with UTI and 30 mg/dl and 82-70 RBC - Starting CRRT. 4 k fluids.  Keep even as tolerated to start  - would place foley  - check urine protein/cr ratio   # Septic shock  - with bacteremia and endocarditis   - pressors per primary team   # MSSA bacteremia - with endocarditis - abx per primary team   # Acute CVA  - per neuro    # Metabolic acidosis  - septic shock and AKI  - Starting RRT   # Hypernatremia  - free water deficit - improving   # Hypocalcemia  - improves with correction for albumin  - calcium 1 gram IV once was ordered - agree   # Anemia normocytic - no ESA with recent CVA - check iron stores    # Afib with RVR - on amio gtt  # Seizure activity - on EEG monitoring  - per neuro  - azotemia may be contributing  Estanislado Emms 04/27/2020, 12:50 PM    Discussed with critical care.  They are placing a line.  Appreciate assistance.   Estanislado Emms, MD 12:52 PM 04/27/2020

## 2020-04-27 NOTE — Procedures (Signed)
Central Venous Catheter Insertion Procedure Note  TABOR DENHAM  [id number]  [birth year]  Date:04/27/20  Time:2:57 PM   Provider Performing:Jasenia Weilbacher Carmon [facility]   Procedure: Insertion of Non-tunneled Central Venous Catheter(36556)with US guidance (37628)    Indication(s) Hemodialysis  Consent Risks of the procedure as well as the alternatives and risks of each were explained to the patient and/or caregiver.  Consent for the procedure was obtained and is signed in the bedside chart  Anesthesia Topical only with 1% lidocaine   Timeout Verified patient identification, verified procedure, site/side was marked, verified correct patient position, special equipment/implants available, medications/allergies/relevant history reviewed, required imaging and test results available.  Sterile Technique Maximal sterile technique including full sterile barrier drape, hand hygiene, sterile gown, sterile gloves, mask, hair covering, sterile ultrasound probe cover (if used).  Procedure Description Area of catheter insertion was cleaned with chlorhexidine and draped in sterile fashion.   With real-time ultrasound guidance a HD catheter was placed into the right internal jugular vein.  Nonpulsatile blood flow and easy flushing noted in all ports.  The catheter was sutured in place and sterile dressing applied.  Complications/Tolerance None; patient tolerated the procedure well. Chest X-ray is ordered to verify placement for internal jugular or subclavian cannulation.  Chest x-ray is not ordered for femoral cannulation.  EBL Minimal  Specimen(s) None    Procedure was done with real time US guidance under the direct supervision of Dr. Celine Mans. Pt tolerated procedure well. CXR is pending.     Bevelyn Ngo, MSN, AGACNP-BC [facility] See Amion for personal pager PCCM on call pager [phone] 04/27/2020 2:59 PM

## 2020-04-27 NOTE — Progress Notes (Addendum)
NAME:  Zachary Woods, MRN:  [id number], DOB:  1965-03-21, LOS: 7 ADMISSION DATE:  04/26/2020, CHIEF COMPLAINT: AMS in the setting of acute stroke   Brief History:  55 year old male who was admitted on 2/18 with left MCA infarction and small frontal SAH. Required intubation for progressive AMS.  History of Present Illness:  Zachary Woods is a 55 year old male with pertinent PMH of HTN, CVA, PFO s/p closure in 2018, DM 2, and GERD who presented to the ED with right sided weakness. He was last in his normal state of health the day prior. Overnight, the patient got up to go to the bathroom and could not move his right side and fell to the ground.   In ED, CT head showed small volume SAH. CTA/CTP demonstrated moderate sized left MCA territory ischemic area.   Patient's AMS continued to progress and needed to be intubated. PCCM was subsequently consulted and patient was transferred/ admitted to Neuro CCU. Per note, neurology is worried about septic emboli and recommend a TEE looking for endocarditis and vegetative valvular lesions. Empiric Vanc/ Cefepime.   Past Medical History:  has Hypertension; GERD (gastroesophageal reflux disease); Malignant hypertension; Cerebral embolism with cerebral infarction; Stroke determined by clinical assessment (HCC); PFO (patent foramen ovale); Acute CVA (cerebrovascular accident) (HCC); Subarachnoid hemorrhage (HCC); Type 2 diabetes mellitus with hyperosmolar hyperglycemic state (HHS) (HCC); AKI (acute kidney injury) (HCC); SIRS (systemic inflammatory response syndrome) (HCC); Metabolic acidosis with increased anion gap and accumulation of organic acids; Hypomagnesemia; Hypokalemia; Thrombocytopenia (HCC); and Stroke (HCC) on their problem list.    Significant Hospital Events:  2/18 > admit, intubated  Consults:  Cardiology, Cardio-Thoracic and Neurology   Procedures:    Significant Diagnostic Tests:  CT / CTA / CTP head and neck 2/18 > small volume right  frontal SAH, left MCA occlusion, ischemic area of 37cc with core infarct 21cc, 60% prox left ICA stenosis. MRI brain 2/18 > mod to large acute left MCA infarct, 1.8cm acute cortical / subcortical left occipital lobe infarct, small volume right frontal lobe SAH. Echo 2/18 > EF 60 - 65%, no source of embolism detected. LE duplex 2/18 > neg. Carotid US 2/18 > 1 - 39% stenosis left ICA. EEG 2/18 > mod to diffuse encephalopathy without seizures or epileptiform discharges. Chest x-ray 2/19- poor inspiratory effort but otherwise normal TEE 2/22 >  Micro Data:  COVID 2/18 > neg. Flu 2/18 > neg. Blood cultures 2/18- MSSA positive. 2/19 negative.   Antimicrobials:  Naficillin Interim History / Subjective:  Patient remains sedated and does not respond to voice or painful stimuli. Having some active seizure activity almost hourly. No other changes in mental status overnight and has been having some loose stools.  Objective   Blood pressure (!) 98/51, pulse (!) 112, temperature (!) 102.7 F (39.3 C), temperature source Oral, resp. rate (!) 27, height 5\' 6"  (1.676 m), weight 87.6 kg, SpO2 96 %.    Vent Mode: PRVC FiO2 (%):  [30 %] 30 % Set Rate:  [18 bmp] 18 bmp Vt Set:  [510 mL] 510 mL PEEP:  [5 cmH20] 5 cmH20 Plateau Pressure:  [13 cmH20-22 cmH20] 13 cmH20   Intake/Output Summary (Last 24 hours) at 04/27/2020 1021 Last data filed at 04/27/2020 04/29/2020 Gross per 24 hour  Intake 3742.7 ml  Output 875 ml  Net 2867.7 ml   Filed Weights   04/21/20 0342 04/22/20 0400 04/25/20 0500  Weight: 82.4 kg 87.6 kg 87.6 kg  Examination: General: Sedated in NAD. EEG leads in place HENT: intubated Lungs: Clear to auscultation. No wheezes or crackles Cardiovascular: Tachycardic, but normal rhythm. No M/R/G  Neuro: does not awaken to voice or tactile stimuli Skin: Positive for numerous janeway lesions on UE bilaterally and left LE.  GU: Catheter in place   Resolved Hospital Problem list      Assessment & Plan:  Critically ill due to Left MCA stroke with left MCA branch occlusion with small right frontal subarachnoid. Multifactorial encephalopathy:  Extubation not indicated  - Vimpat 100 mg, Keppra at 500mg  IV Q12h, and Valproic acid 500mg  TID  - Continuous EEG monitoring  - Consider re-imaging due to lack of improvement in mental status, off propofol for now.appreciate neurology recommendations.  Infective endocarditis: MSSA bacteremia, Bacterial Meningitis- possible skin source, sepsis: Improving - Continue Naficillin as per ID recs for better CNS coverage - Continue Rifampin per ID for PVE   Atrial Fibrillation: - Amiodarone 60mg /hr IV    Renal failure: (Cr: 4.42, BUN: 161) +20L from admit - Consult Nephrology  - Start CRRT  Uncontrolled type 2 diabetes; Stable  - Continue TF  - Novolog for TF coverage to 10 units Q4h  - Add 40 units Lantus BID   Hypotensinon: Stable  - Titrate Levophed as needed. Goal SBP > 100 and MAP > 65  Hypernatremia:  - Continue free water to 200 mL Q2h   Hypokalemia: Resolved  - Monitor BMP   Constipation- No BM since 2/19: Improved - Hold bowel regimen   Loose Stool possible C. Difficile: - Send C.Diff panel for increased watery stools in setting of increased WBC and Tmax 103   Best practice (evaluated daily)  Pain/Anxiety/Delirium protocol (if indicated): None Neuro vitals: every 2 hours AED's: Keppra and Valproic Acid  VAP protocol (if indicated): Intubated, maintain VAP protocols Respiratory support goals: Titrate oxygen to keep sats greater than 88% Blood pressure target: Keep systolic above 100 DVT prophylaxis: Heparin 3 times daily Nutrition Status:  HOLD for TEE GI prophylaxis: Not indicated Fluid status goals: Allow autoregulation at this time Urinary catheter: Use external catheter Central lines: No central line Glucose control: Type 2 diabetes. Still hyperglycemic. Increasing lantus.  Mobility/therapy needs:  Bedrest Antibiotic de-escalation: Naficillin Home medication reconciliation: On hold Daily labs: CBC BMP Code Status: Full code Family Communication: Wife updated over the phone 2/25 regarding need to started dialysis.  Disposition: ICU  Labs   CBC: Recent Labs  Lab 04/17/2020 1714 04/21/20 0423 04/22/2020 0405 04/24/20 0556 04/25/20 0521 04/26/20 0159 04/26/20 1920 04/27/20 0415  WBC 12.0*   < > 7.3 7.6 7.5 9.6  --  12.8*  NEUTROABS 11.0*  --   --   --   --  8.1*  --  10.8*  HGB 13.5   < > 11.5* 10.1* 10.8* 10.4* 9.5* 9.3*  HCT 37.9*   < > 33.6* 28.2* 30.7* 29.9* 28.0* 26.0*  MCV 94.0   < > 97.4 97.6 97.2 95.5  --  95.6  PLT 62*   < > 53* 63* 76* 84*  --  92*   < > = values in this interval not displayed.    Basic Metabolic Panel: Recent Labs  Lab 04/21/20 0423 04/21/20 2141 04/22/20 0345 04/22/20 1604 04/22/20 1706 04/22/2020 0405 04/29/2020 1630 04/24/20 0556 04/25/20 0521 04/26/20 0159 04/26/20 1920 04/27/20 0415  NA 136  --    < >  --    < > 145  --  148* 149* 152* 151*  147*  K 3.4*  --    < >  --    < > 3.8  --  3.4* 3.8 3.7 4.0 3.9  CL 108  --    < >  --   --  116*  --  111 113* 115*  --  110  CO2 15*  --    < >  --   --  17*  --  22 19* 18*  --  17*  GLUCOSE 153*  --    < >  --   --  289*  --  274* 343* 301*  --  361*  BUN 23*  --    < >  --   --  66*  --  85* 127* 151*  --  161*  CREATININE 1.24  --    < >  --   --  1.62*  --  1.92* 2.93* 3.45*  --  4.42*  CALCIUM 7.7*  --    < >  --   --  7.8*  --  7.5* 7.4* 7.4*  --  6.5*  MG 2.1  --   --  2.6*  --  2.6* 2.8* 2.8*  --   --   --   --   PHOS 1.6* 2.6  --  3.4  --  2.2* 3.1 5.5*  --   --   --   --    < > = values in this interval not displayed.   GFR: Estimated Creatinine Clearance: 19.8 mL/min (A) (by C-G formula based on SCr of 4.42 mg/dL (H)). Recent Labs  Lab 04/07/2020 1714 04/10/2020 2112 04/21/20 0423 04/21/20 1815 04/22/20 0345 04/22/20 1112 05-11-20 0405 04/24/20 0556 04/25/20 0521  04/26/20 0159 04/27/20 0415  PROCALCITON 34.38  --   --   --   --   --   --   --   --   --   --   WBC 12.0*  --    < >  --    < >  --    < > 7.6 7.5 9.6 12.8*  LATICACIDVEN 5.8* 4.5*  --  4.1*  --  2.9*  --   --   --   --   --    < > = values in this interval not displayed.    Liver Function Tests: Recent Labs  Lab 04/26/20 0159 04/27/20 0415  AST 53* 45*  ALT <5 7  ALKPHOS 96 91  BILITOT 6.8* 6.4*  PROT 5.0* 5.1*  ALBUMIN 1.3* 1.1*   No results for input(s): LIPASE, AMYLASE in the last 168 hours. No results for input(s): AMMONIA in the last 168 hours.  ABG    Component Value Date/Time   PHART 7.385 04/26/2020 1920   PCO2ART 36.7 04/26/2020 1920   PO2ART 61 (L) 04/26/2020 1920   HCO3 21.9 04/26/2020 1920   TCO2 23 04/26/2020 1920   ACIDBASEDEF 3.0 (H) 04/26/2020 1920   O2SAT 91.0 04/26/2020 1920     Coagulation Profile: Recent Labs  Lab 04/27/20 0415  INR 1.5*    Cardiac Enzymes: No results for input(s): CKTOTAL, CKMB, CKMBINDEX, TROPONINI in the last 168 hours.  HbA1C: Hgb A1c MFr Bld  Date/Time Value Ref Range Status  04/05/2020 12:17 PM 10.5 (H) 4.8 - 5.6 % Final    Comment:    (NOTE) Pre diabetes:          5.7%-6.4%  Diabetes:              >  6.4%  Glycemic control for   <7.0% adults with diabetes   08/04/2019 05:25 PM 8.2 (H) 4.8 - 5.6 % Final    Comment:             Prediabetes: 5.7 - 6.4          Diabetes: >6.4          Glycemic control for adults with diabetes: <7.0     CBG: Recent Labs  Lab 04/26/20 1511 04/26/20 1953 04/26/20 2317 04/27/20 0336 04/27/20 0734  GLUCAP 215* 277* 321* 317* 232*    Critical care time:     Andreas NewportHenry Turcios, MS4   The patient is critically ill due to respiratory failure, septic shock, endocarditis, renal failure.  Critical care was necessary to treat or prevent imminent or life-threatening deterioration.  Critical care was time spent personally by me on the following activities: development of treatment  plan with patient and/or surrogate as well as nursing, discussions with consultants, evaluation of patient's response to treatment, examination of patient, obtaining history from patient or surrogate, ordering and performing treatments and interventions, ordering and review of laboratory studies, ordering and review of radiographic studies, pulse oximetry, re-evaluation of patient's condition and participation in multidisciplinary rounds.   Critical Care Time devoted to patient care services described in this note is 54 minutes. This time reflects time of care of this signee Charlott HollerNikita S Maguadalupe Lata . This critical care time does not reflect separately billable procedures or procedure time, teaching time or supervisory time of PA/NP/Med student/Med Resident etc but could involve care discussion time.       Mickel BaasNikita S Elam Ellis [facility] 04/27/2020 4:00 PM  Pager: see AMION After hours pager: [phone]  If no response to pager , please call [phone] until 7pm After 7:00 pm call Elink  [phone]

## 2020-04-27 NOTE — Progress Notes (Addendum)
STROKE TEAM PROGRESS NOTE   INTERVAL HISTORY Febrile to 102.8 with tachycardia to 160s and hypotensive over night. Blood cultures and CXR obtained.  Creatinine rising, 4.45 this morning.  Ongoing diarrhea per discussion with Dr. Celine Mansesai and bedside RN.   Vitals:   04/27/20 0400 04/27/20 0500 04/27/20 0600 04/27/20 0741  BP: 105/64 114/65 111/64 (!) 98/51  Pulse: (!) 103 (!) 103 (!) 108 (!) 112  Resp: (!) 25 (!) 26 (!) 25 (!) 27  Temp: (!) 102.7 F (39.3 C)     TempSrc: Oral     SpO2: 95% 96% 96% 96%  Weight:      Height:       CBC:  Recent Labs  Lab 04/26/20 0159 04/26/20 1920 04/27/20 0415  WBC 9.6  --  12.8*  NEUTROABS 8.1*  --  10.8*  HGB 10.4* 9.5* 9.3*  HCT 29.9* 28.0* 26.0*  MCV 95.5  --  95.6  PLT 84*  --  92*   Basic Metabolic Panel:  Recent Labs  Lab 07-30-20 1630 04/24/20 0556 04/25/20 0521 04/26/20 0159 04/26/20 1920 04/27/20 0415  NA  --  148*   < > 152* 151* 147*  K  --  3.4*   < > 3.7 4.0 3.9  CL  --  111   < > 115*  --  110  CO2  --  22   < > 18*  --  17*  GLUCOSE  --  274*   < > 301*  --  361*  BUN  --  85*   < > 151*  --  161*  CREATININE  --  1.92*   < > 3.45*  --  4.42*  CALCIUM  --  7.5*   < > 7.4*  --  6.5*  MG 2.8* 2.8*  --   --   --   --   PHOS 3.1 5.5*  --   --   --   --    < > = values in this interval not displayed.   Lipid Panel:  Recent Labs  Lab 04/29/2020 1217 04/24/20 0556  CHOL 98  --   TRIG 327* 187*  HDL <10*  --   CHOLHDL NOT CALCULATED  --   VLDL 65*  --   LDLCALC NOT CALCULATED  --    HgbA1c:  Recent Labs  Lab 04/05/2020 1217  HGBA1C 10.5*   Urine Drug Screen:  Recent Labs  Lab 04/11/2020 1955  LABOPIA POSITIVE*  COCAINSCRNUR NONE DETECTED  LABBENZ NONE DETECTED  AMPHETMU NONE DETECTED  THCU NONE DETECTED  LABBARB NONE DETECTED    Alcohol Level No results for input(s): ETH in the last 168 hours.  IMAGING past 24 hours DG CHEST PORT 1 VIEW  Result Date: 04/26/2020 CLINICAL DATA:  Central line placement  EXAM: PORTABLE CHEST 1 VIEW COMPARISON:  04/24/2020 FINDINGS: Single frontal view of the chest demonstrates endotracheal tube overlying tracheal air column tip at thoracic inlet. Enteric catheter passes below diaphragm tip excluded by collimation. With internal jugular central venous catheter tip overlies the right atrium. The cardiac silhouette is unremarkable. No airspace disease, effusion, or pneumothorax. No acute bony abnormalities. IMPRESSION: 1. No complication after left internal jugular catheter placement. 2. No acute intrathoracic process. Electronically Signed   By: Sharlet SalinaMichael  Brown M.D.   On: 04/26/2020 19:42   PHYSICAL EXAM  Temp:  [101.8 F (38.8 C)-102.8 F (39.3 C)] 102.7 F (39.3 C) (02/25 0400) Pulse Rate:  [50-165] 112 (02/25 0741) Resp:  [  23-32] 27 (02/25 0741) BP: (64-122)/(51-79) 98/51 (02/25 0741) SpO2:  [91 %-100 %] 96 % (02/25 0741) Arterial Line BP: (102-116)/(48-63) 103/51 (02/25 0600) FiO2 (%):  [30 %] 30 % (02/25 0741)  General - Well nourished, well developed, intubated on sedation.  Ophthalmologic - fundi not visualized due to noncooperation.  Cardiovascular - regular rhythm but tachycardia, not in afib.  Neuro - intubated on sedation, eyes closed, not following commands. With forced eye opening, eyes in mid position, not blinking to visual threat, doll's eyes absent, not tracking, pupil b/l 47mm, sluggish. Corneal reflex weakly present on the left but not on the right, gag and cough present. Breathing over the vent.  Facial symmetry not able to test due to ET tube.  Tongue protrusion not cooperative. On pain stimulation, no movement of all extremities. DTR diminished and no babinski. Sensation, coordination and gait not tested.   ASSESSMENT/PLAN Zachary Woods is a 55 y.o. male with history of previous cryptogenic Left MCA stroke s/p PFO closure, lost to stroke follow up 2018, DM2, HTN, GERD presenting with aphasia, right facial droop and right sided weakness  noted upon waking up at 0300 this morning. He slid to the ground from the bed after awakening. EMS was called and he was brought to ED in code stroke status. NIHSS of 23 was noted upon arrival.   Left MCA stroke d/t left MCA branch occlusion with small right frontal SAH - likely due to endocarditis with small mycotic aneurysm  Code Stroke HCT: small single sulcal SAH  CTA- occlusion of a trifurcation branch of the left MCA.  No aneurysm  CTP - Ischemic area 37cc, ischemic core 63ml by CBF<30%, CBV - 56ml  MRI - Moderate to large acute left MCA territory cortical/subcortical infarct affecting the left frontal and parietal lobes, as well as left insula and subinsular region. 1.8 cm acute cortical/subcortical left occipital lobe infarct, a few additional punctate acute infarcts are questioned within the right frontoparietal white matter (versus artifact). Redemonstrated small volume subarachnoid hemorrhage overlying the anterior right frontal lobe.  CT head x 3 - unchanged  2D Echo EF 60-65%. No shunt. Septal Repair:25 mm Amplazter on 08/01/2016 noted.    TEE Large 1x2 cm vegetation associated with the Amplatzer atrial septal occlusion device  LDL, dierct 15.3  HgbA1c 10.5  Beta-2-glycoprotein repeat 94   DVT PPX: SCDS  On ASA 81mg  PTA, now no antiplatelets due to thrombocytopenia and endocarditis with [facility] concerning for mycotic aneurysm  Therapy recommendations:  TBD  Disposition:  TBD  Seizure  EEG 2/20 potential epileptogenicity arising from left temporal region as well as moderate diffuse encephalopathy  LTM 2/21 multiple seizures without clinical signs arising from left temporoparietal region   LTM 2/22 two seizures without clinical signs arising from left temporoparietal region, improving from previous day  LTM 2/23 epileptogenicity arising from left temporoparietal region. Additionally there ismoderate diffuse encephalopathy, nonspecific etiology.  LTM 2/24 multiple  seizures without clinical signs arising from left temporoparietal region, one seizure every few hours, lasting 1 to 2 minutes each. Moderate diffuse encephalopathy   LTM 2/25 brief ictal-interictal rhythmic discharges arising from left temporal parietal region which is on the ictal-interictal continuum with high potential for seizures.Additionally there ismoderate diffuse encephalopathy  On Keppra 500 bid; valproic acid 500 tid and vimpat 100 bid  Antiphospholipid syndrome  07/2016 beta-2 glycoprotein IgG = 57 in the setting of stroke  11/2016 = 90 - 3 months repeat  04/2020 = 94 in the setting  of stroke  Will need long term AC once appropriate  afib RVR, newly developed  Developed overnight 2/22  On amiodarone IV  Cardizem IV x 1 ; Metoprolol prn  May need long term AC once appropriate  Hx of stroke PFO closure  07/2016 patient had left MCA small infarct.  MRA head and neck negative.  DVT, TTE, pan CT negative.  TCD bubble study showed a small PFO.  TEE positive for PFO.  LDL 68 and A1c 5.6.  THC positive.  Hypercoagulable work-up showed beta-2 glycoprotein IgG 57.  Discharged with aspirin.  PFO closure done in 08/2016.  Repeat TTE showed good PFO closure procedure.  He was put on DAPT and Lipitor after PFO closure.  Repeat beta-2 glycoprotein IgG in 11/2016 was 90.  However, patient was lost follow-up with neurology.  Antiphospholipid syndrome was not diagnosed and was not treated.  MSSA bacteremia Septic shock Bacterial meningitis Endocarditis UTI Fever  Tmax 101.3->103->103.5->102.3->103.2  Leukocytosis 10.8->9.8->7.5->9.6->12.8  Lactic acid 7.5->5.8->4.5->2.9 (final)  Antibiotics narrowed to nafcillin and rifampin  B Cx MSSA, serial Blood cultures NGTD  TEE Large 1x2 cm vegetation associated with the Amplatzer atrial septal occlusion device  Urine Cx - MSSA  CSF WBC 90 (Neutro 71%, lymph 29%), RBC 57, protein 117, glucose 104 (blood glucose 300s), Cx NGTD  ID  on board  C.diff checked and negative 2/25  Management per CCM  Respiratory failure  Tachypnea with respiratory distress  Intubated 2/20  Not candidate for extubation  CCM on board   Hx of HTN hypotension . HTN meds at home: Norvasc 10mg  daily, hctz 12.5mg  daily, not ordered. . Hypotension: On levophed and vasopressin per CCM . On MIVF at 150cc/hr -> 75cc -> FW 200 Q2h -> FW 100 Q4 . Keep BP at least greater than 100 systolic.  Long-term BP goal normotensive  Hyperlipidemia  Home meds: None. Per chart review he stopped statin 2018 d/t myalgia  TG 327   LDL direct 15.3, goal < 70  No statin indicated now given low LDL  Diabetes type II UnControlled  Home meds:  Glucophage 500mg  daily   HgbA1c 10.5, goal < 7.0  Hyperosmolar hyperglycemic on admission  Was on insulin drip -> lantus 30U->40U bid  Persistent hyperglycemia  CBGs  SSI  Close PCP follow up after discharge  Thrombocytopenia  Plt 74->62->45->47->63->76->84->92  Likely reactive to current critical illness  Hepatitis panel non-reactive  ASA discontinued  HOLD antiplatelet meds   on heparin subq Q8   AKI, Renal failure  AKI Cre 1.55->1.23->1.62->1.92->2.93->3.45->4.45  On FW  CCM on board  Nephrology consulted.   2019 abdomen no hydronephrosis  Plan for CRRT   Other Stroke Risk Factors   Former Cigarette smoker  Other Active Problems  Metabolic Acidosis, Initial anion gap 16 with CO2 19, improving with anion gap 12 On sodium bicarb infusion  Hyponatremia->hypernatremia, Na 124->126->135->133->136->140->145->148->149->151->147 - on FW  Hospital day # 7   This patient is critically ill due to septic shock, endocarditis, bacterial meningitis, UTI, persistent fever, atrial fibrillation RVR, AKI, thrombocytopenia, seizure, antiphospholipid syndrome and at significant risk of neurological worsening, death form septic shock, heart failure, renal failure, status epilepticus,  recurrent stroke. This patient's care requires constant monitoring of vital signs, hemodynamics, respiratory and cardiac monitoring, review of multiple databases, neurological assessment, discussion with family, other specialists and medical decision making of high complexity. I spent 30 minutes of neurocritical care time in the care of this patient.  , MD PhD Stroke Neurology  04/27/2020 7:58 AM   To contact Stroke Continuity provider, please refer to [email or web address]. After hours, contact General Neurology

## 2020-04-27 NOTE — Progress Notes (Signed)
SLP Cancellation Note  Patient Details Name: OLLIS DAUDELIN MRN: [id number] DOB: 1965-07-12   Cancelled treatment:        Continues to be intubated. ST will sign off. Please reorder if becomes appropriate.   Royce Macadamia 04/27/2020, 8:28 AM   Breck Coons Lonell Face.Ed Nurse, children's [phone] Office [phone]

## 2020-04-27 NOTE — Progress Notes (Signed)
RN spoke with Dr. Lindie Spruce about C-Diff order.  OK to collect and send.

## 2020-04-27 NOTE — Progress Notes (Signed)
LTM maintenance compleyed; checked under A1, A2, F7, and T8; minor skin irritation behind A2, moved lead.

## 2020-04-27 NOTE — Progress Notes (Signed)
OT Cancellation Note  Patient Details Name: Zachary Woods MRN: [id number] DOB: [birth year]   Cancelled Treatment:    Reason Eval/Treat Not Completed: Patient not medically ready (Pt remains intubated and unresponsive at this time) Will hold and return as pt medically appropriate.   Salinda Snedeker M Chinara Hertzberg Jett Kulzer MSOT, OTR/L Acute Rehab Pager: [phone] Office: [phone] 04/27/2020, 11:30 AM

## 2020-04-27 NOTE — Progress Notes (Signed)
PT Cancellation Note  Patient Details Name: CADARIUS NEVARES MRN: [id number] DOB: 1965/03/16   Cancelled Treatment:    Reason Eval/Treat Not Completed: Medical issues which prohibited therapy this morning. Pt remains intubated and unresponsive at this time, not appropriate for PT interventions at this time. Will continue to follow and progress as appropriate.   [name], DPT   Acute Rehabilitation Department Pager #: [phone]   [name] 04/27/2020, 8:29 AM

## 2020-04-27 NOTE — Procedures (Addendum)
Patient Name:Zachary Woods Epilepsy Attending:Tunisha Ruland Annabelle Harman Referring Physician/Provider:Dr Lynnell Catalan Duration:04/26/2020 1343 to 2/25/20221343  Patient history:55 y.o.malewith history of previous cryptogenic Left MCA stroke s/p PFO closure, lost to stroke follow up 2018, DM2, HTN, GERDpresenting withaphasia, right facial droop and right sided weakness noted upon waking up at 0300 this morning.EEG to evaluate for seizure  Level of alertness:lethargic, asleep  AEDs during EEG study:LEV, VPA, LCM, Propofol  Technical aspects: This EEG study was done with scalp electrodes positioned according to the 10-20 International system of electrode placement. Electrical activity was acquired at a sampling rate of 500Hz  and reviewed with a high frequency filter of 70Hz  and a low frequency filter of 1Hz . EEG data were recorded continuously and digitally stored.   Description:Noposterior dominant rhythm was seen.Sleep was characterized by sleep spindles (12 to 14 Hz), maximal frontocentral region.EEG showed continuous generalized 3 to 6 Hz theta-delta slowingwith triphasic morphology at times.Spikes were seen in left temporal regionwhich at times appeared periodic at 1 Hz and rhythmic admixed with 15-18Hz  beta activity lasting 5-10 seconds  ABNORMALITY -Brief ictal-interictal rhythmic discharges, left temporoparietal region -Spike, left temporal region -Continuousslow, generalized  IMPRESSION: This studyshowedbrief ictal-interictal rhythmic discharges arising from left temporal parietal region which is on the ictal-interictal continuum with high potential for seizures. Additionally there ismoderate diffuse encephalopathy, nonspecific etiology.  Flonnie Wierman 

## 2020-04-27 NOTE — Progress Notes (Addendum)
eLink Physician-Brief Progress Note Patient Name: [name] DOB: 08/30/1965 MRN: [id number]   Date of Service  04/27/2020  HPI/Events of Note  Patient is back in atrial fibrillation which persists despite patient being put back on Amiodarone at 60 mg / hour by the bedside RN, MAP 68.  eICU Interventions  Will give Cardizem 5 mg iv x 1.        Thomasene Lot Amarise Lillo 04/27/2020, 2:25 AM

## 2020-04-28 DIAGNOSIS — A4101 Sepsis due to Methicillin susceptible Staphylococcus aureus: Principal | ICD-10-CM

## 2020-04-28 DIAGNOSIS — I63412 Cerebral infarction due to embolism of left middle cerebral artery: Secondary | ICD-10-CM

## 2020-04-28 DIAGNOSIS — R569 Unspecified convulsions: Secondary | ICD-10-CM | POA: Diagnosis not present

## 2020-04-28 DIAGNOSIS — B9561 Methicillin susceptible Staphylococcus aureus infection as the cause of diseases classified elsewhere: Secondary | ICD-10-CM | POA: Diagnosis not present

## 2020-04-28 DIAGNOSIS — G9341 Metabolic encephalopathy: Secondary | ICD-10-CM

## 2020-04-28 DIAGNOSIS — Q211 Atrial septal defect: Secondary | ICD-10-CM

## 2020-04-28 DIAGNOSIS — R6521 Severe sepsis with septic shock: Secondary | ICD-10-CM | POA: Diagnosis not present

## 2020-04-28 DIAGNOSIS — R7881 Bacteremia: Secondary | ICD-10-CM | POA: Diagnosis not present

## 2020-04-28 DIAGNOSIS — I639 Cerebral infarction, unspecified: Secondary | ICD-10-CM | POA: Diagnosis not present

## 2020-04-28 DIAGNOSIS — Z9689 Presence of other specified functional implants: Secondary | ICD-10-CM

## 2020-04-28 DIAGNOSIS — N179 Acute kidney failure, unspecified: Secondary | ICD-10-CM | POA: Diagnosis not present

## 2020-04-28 DIAGNOSIS — J9600 Acute respiratory failure, unspecified whether with hypoxia or hypercapnia: Secondary | ICD-10-CM | POA: Diagnosis not present

## 2020-04-28 DIAGNOSIS — K7469 Other cirrhosis of liver: Secondary | ICD-10-CM

## 2020-04-28 DIAGNOSIS — A419 Sepsis, unspecified organism: Secondary | ICD-10-CM | POA: Diagnosis not present

## 2020-04-28 LAB — POCT I-STAT 7, (LYTES, BLD GAS, ICA,H+H)
Acid-base deficit: 11 mmol/L — ABNORMAL HIGH (ref 0.0–2.0)
Acid-base deficit: 7 mmol/L — ABNORMAL HIGH (ref 0.0–2.0)
Bicarbonate: 15.3 mmol/L — ABNORMAL LOW (ref 20.0–28.0)
Bicarbonate: 20.8 mmol/L (ref 20.0–28.0)
Calcium, Ion: 0.89 mmol/L — CL (ref 1.15–1.40)
Calcium, Ion: 0.9 mmol/L — ABNORMAL LOW (ref 1.15–1.40)
HCT: 25 % — ABNORMAL LOW (ref 39.0–52.0)
HCT: 23 % — ABNORMAL LOW (ref 39.0–52.0)
Hemoglobin: 8.5 g/dL — ABNORMAL LOW (ref 13.0–17.0)
Hemoglobin: 7.8 g/dL — ABNORMAL LOW (ref 13.0–17.0)
O2 Saturation: 83 %
O2 Saturation: 98 %
Patient temperature: 36.5
Patient temperature: 98.6
Potassium: 4.6 mmol/L (ref 3.5–5.1)
Potassium: 4.8 mmol/L (ref 3.5–5.1)
Sodium: 143 mmol/L (ref 135–145)
Sodium: 143 mmol/L (ref 135–145)
TCO2: 16 mmol/L — ABNORMAL LOW (ref 22–32)
TCO2: 22 mmol/L (ref 22–32)
pCO2 arterial: 32.2 mmHg (ref 32.0–48.0)
pCO2 arterial: 57.1 mmHg — ABNORMAL HIGH (ref 32.0–48.0)
pH, Arterial: 7.282 — ABNORMAL LOW (ref 7.350–7.450)
pH, Arterial: 7.169 — CL (ref 7.350–7.450)
pO2, Arterial: 51 mmHg — ABNORMAL LOW (ref 83.0–108.0)
pO2, Arterial: 146 mmHg — ABNORMAL HIGH (ref 83.0–108.0)

## 2020-04-28 LAB — COMPREHENSIVE METABOLIC PANEL
ALT: 8 U/L (ref 0–44)
AST: 69 U/L — ABNORMAL HIGH (ref 15–41)
Albumin: 1.2 g/dL — ABNORMAL LOW (ref 3.5–5.0)
Alkaline Phosphatase: 145 U/L — ABNORMAL HIGH (ref 38–126)
Anion gap: 20 — ABNORMAL HIGH (ref 5–15)
BUN: 102 mg/dL — ABNORMAL HIGH (ref 6–20)
CO2: 15 mmol/L — ABNORMAL LOW (ref 22–32)
Calcium: 7 mg/dL — ABNORMAL LOW (ref 8.9–10.3)
Chloride: 107 mmol/L (ref 98–111)
Creatinine, Ser: 3.03 mg/dL — ABNORMAL HIGH (ref 0.61–1.24)
GFR, Estimated: 24 mL/min — ABNORMAL LOW (ref >60)
Glucose, Bld: 35 mg/dL — CL (ref 70–99)
Potassium: 5.2 mmol/L — ABNORMAL HIGH (ref 3.5–5.1)
Sodium: 142 mmol/L (ref 135–145)
Total Bilirubin: 7.6 mg/dL — ABNORMAL HIGH (ref 0.3–1.2)
Total Protein: 5.8 g/dL — ABNORMAL LOW (ref 6.5–8.1)

## 2020-04-28 LAB — PHOSPHORUS: Phosphorus: 9.7 mg/dL — ABNORMAL HIGH (ref 2.5–4.6)

## 2020-04-28 LAB — RENAL FUNCTION PANEL
Albumin: 1.5 g/dL — ABNORMAL LOW (ref 3.5–5.0)
Albumin: 1.6 g/dL — ABNORMAL LOW (ref 3.5–5.0)
Anion gap: 20 — ABNORMAL HIGH (ref 5–15)
Anion gap: 20 — ABNORMAL HIGH (ref 5–15)
BUN: 96 mg/dL — ABNORMAL HIGH (ref 6–20)
BUN: 71 mg/dL — ABNORMAL HIGH (ref 6–20)
CO2: 16 mmol/L — ABNORMAL LOW (ref 22–32)
CO2: 18 mmol/L — ABNORMAL LOW (ref 22–32)
Calcium: 7.1 mg/dL — ABNORMAL LOW (ref 8.9–10.3)
Calcium: 6.9 mg/dL — ABNORMAL LOW (ref 8.9–10.3)
Chloride: 106 mmol/L (ref 98–111)
Chloride: 101 mmol/L (ref 98–111)
Creatinine, Ser: 2.98 mg/dL — ABNORMAL HIGH (ref 0.61–1.24)
Creatinine, Ser: 2.27 mg/dL — ABNORMAL HIGH (ref 0.61–1.24)
GFR, Estimated: 24 mL/min — ABNORMAL LOW (ref >60)
GFR, Estimated: 33 mL/min — ABNORMAL LOW (ref >60)
Glucose, Bld: 26 mg/dL — CL (ref 70–99)
Glucose, Bld: 120 mg/dL — ABNORMAL HIGH (ref 70–99)
Phosphorus: 9.9 mg/dL — ABNORMAL HIGH (ref 2.5–4.6)
Phosphorus: 8.6 mg/dL — ABNORMAL HIGH (ref 2.5–4.6)
Potassium: 5.4 mmol/L — ABNORMAL HIGH (ref 3.5–5.1)
Potassium: 4.6 mmol/L (ref 3.5–5.1)
Sodium: 142 mmol/L (ref 135–145)
Sodium: 139 mmol/L (ref 135–145)

## 2020-04-28 LAB — CBC WITH DIFFERENTIAL/PLATELET
Abs Immature Granulocytes: 2.02 10*3/uL — ABNORMAL HIGH (ref 0.00–0.07)
Basophils Absolute: 0.1 10*3/uL (ref 0.0–0.1)
Basophils Relative: 0 %
Eosinophils Absolute: 0 10*3/uL (ref 0.0–0.5)
Eosinophils Relative: 0 %
HCT: 27.2 % — ABNORMAL LOW (ref 39.0–52.0)
Hemoglobin: 9.2 g/dL — ABNORMAL LOW (ref 13.0–17.0)
Immature Granulocytes: 7 %
Lymphocytes Relative: 6 %
Lymphs Abs: 1.7 10*3/uL (ref 0.7–4.0)
MCH: 33.5 pg (ref 26.0–34.0)
MCHC: 33.8 g/dL (ref 30.0–36.0)
MCV: 98.9 fL (ref 80.0–100.0)
Monocytes Absolute: 0.8 10*3/uL (ref 0.1–1.0)
Monocytes Relative: 3 %
Neutro Abs: 23.9 10*3/uL — ABNORMAL HIGH (ref 1.7–7.7)
Neutrophils Relative %: 84 %
Platelets: 124 10*3/uL — ABNORMAL LOW (ref 150–400)
RBC: 2.75 MIL/uL — ABNORMAL LOW (ref 4.22–5.81)
RDW: 16.9 % — ABNORMAL HIGH (ref 11.5–15.5)
WBC: 28.5 10*3/uL — ABNORMAL HIGH (ref 4.0–10.5)
nRBC: 1.3 % — ABNORMAL HIGH (ref 0.0–0.2)

## 2020-04-28 LAB — GLUCOSE, CAPILLARY
Glucose-Capillary: 110 mg/dL — ABNORMAL HIGH (ref 70–99)
Glucose-Capillary: 151 mg/dL — ABNORMAL HIGH (ref 70–99)
Glucose-Capillary: 231 mg/dL — ABNORMAL HIGH (ref 70–99)
Glucose-Capillary: 58 mg/dL — ABNORMAL LOW (ref 70–99)
Glucose-Capillary: 65 mg/dL — ABNORMAL LOW (ref 70–99)
Glucose-Capillary: 68 mg/dL — ABNORMAL LOW (ref 70–99)
Glucose-Capillary: 91 mg/dL (ref 70–99)
Glucose-Capillary: 93 mg/dL (ref 70–99)

## 2020-04-28 LAB — CULTURE, BLOOD (ROUTINE X 2): Culture: NO GROWTH

## 2020-04-28 LAB — MAGNESIUM: Magnesium: 3.1 mg/dL — ABNORMAL HIGH (ref 1.7–2.4)

## 2020-04-28 MED ORDER — SODIUM BICARBONATE 8.4 % IV SOLN
100.0000 meq | Freq: Once | INTRAVENOUS | Status: AC
  Administered 2020-04-28: 100 meq via INTRAVENOUS
  Filled 2020-04-28: qty 50

## 2020-04-28 MED ORDER — CALCIUM GLUCONATE-NACL 1-0.675 GM/50ML-% IV SOLN
1.0000 g | Freq: Once | INTRAVENOUS | Status: AC
  Administered 2020-04-28: 1000 mg via INTRAVENOUS
  Filled 2020-04-28: qty 50

## 2020-04-28 MED ORDER — SODIUM BICARBONATE 8.4 % IV SOLN
INTRAVENOUS | Status: DC
  Filled 2020-04-28 (×7): qty 850

## 2020-04-28 MED ORDER — DEXTROSE 50 % IV SOLN
25.0000 mL | INTRAVENOUS | Status: DC | PRN
  Administered 2020-04-28: 25 mL via INTRAVENOUS
  Administered 2020-04-28: 50 mL via INTRAVENOUS
  Administered 2020-04-28: 25 mL via INTRAVENOUS
  Filled 2020-04-28: qty 50

## 2020-04-28 MED ORDER — PRISMASOL BGK 0/2.5 32-2.5 MEQ/L EC SOLN
Status: DC
  Filled 2020-04-28 (×17): qty 5000

## 2020-04-28 MED ORDER — PHENYLEPHRINE HCL-NACL 10-0.9 MG/250ML-% IV SOLN
0.0000 ug/min | INTRAVENOUS | Status: DC
  Administered 2020-04-29: 20 ug/min via INTRAVENOUS
  Filled 2020-04-28 (×2): qty 250

## 2020-04-28 MED ORDER — ALBUMIN HUMAN 25 % IV SOLN
25.0000 g | Freq: Once | INTRAVENOUS | Status: AC
  Administered 2020-04-28: 25 g via INTRAVENOUS
  Filled 2020-04-28: qty 100

## 2020-04-28 MED ORDER — INSULIN GLARGINE 100 UNIT/ML ~~LOC~~ SOLN
20.0000 [IU] | Freq: Two times a day (BID) | SUBCUTANEOUS | Status: DC
  Filled 2020-04-28 (×2): qty 0.2

## 2020-04-28 NOTE — Progress Notes (Signed)
eLink Physician-Brief Progress Note Patient Name: GERRALD BASU DOB: Mar 26, 1965 MRN: [id number]   Date of Service  04/28/2020  HPI/Events of Note  Hypotension - BP = 76/49 with MAP = 58. Last pH = 7.282 Blood glucose = 58 --> 91.   eICU Interventions  Plan: 1. NaHCO3 100 meq IV now.  2. D5 NaHCO3 IV infusion to run at 100 mL/hour.  3. Add Phenylephrine IV infusion. Titrate to MAP >= 100.      Intervention Category Major Interventions: Other:;Hypotension - evaluation and management  Bianco Cange Dennard Nip 04/28/2020, 5:45 AM

## 2020-04-28 NOTE — Progress Notes (Signed)
[facility]  Date of Admission:  04/11/2020           Reason for visit: Follow up on MSSA bacteremia  Antibiotics : Nafcillin 2/22 -  Rifampin 2/22 - Gentamicin 2/22 -2/23   Principal Problem:   MSSA bacteremia Active Problems:   PFO (patent foramen ovale)   Acute CVA (cerebrovascular accident) (HCC)   Subarachnoid hemorrhage (HCC)   Type 2 diabetes mellitus with hyperosmolar hyperglycemic state (HHS) (HCC)   AKI (acute kidney injury) (HCC)   SIRS (systemic inflammatory response syndrome) (HCC)   Metabolic acidosis with increased anion gap and accumulation of organic acids   Hypomagnesemia   Hypokalemia   Thrombocytopenia (HCC)   CVA (cerebral vascular accident) (HCC)   Suspected endocarditis   Hyperglycemia   Seizure (HCC)   ASSESSMENT:    # Septic Shock  #MSSA bacteremia/PVEndocarditis ( Prosthetic Device)  In the setting ofLarge 1x2 cm vegetation associated with the Amplatzer atrial septal occlusion device, which appears to extend to the posterior mitral leaflet.  # Acute bacterial meningitis # Acute Respiratory failure #AcuteLeft MCAIschemic Infarct( left frontal, parietal and occipital lobes) #Rt frontal SAH ? Concerns for septic emboli and mycotic aneurysm #Thrombocytopenia- in the setting of sepsis/endocarditis  #Worsening AKI--now on CRRT # Seizures # Liver Cirrhosis - LFTs appear stable  PLAN:    Continue Nafcillin and Rifampin dosed per pharmacy for CRRT  Holding gentamicin for now Fu blood cultures  Monitor CBC, CMP  Vent management and pressors per CCM Prognosis overall not good  SUBJECTIVE:   24 hour events:  C. difficile negative Significant hypoglycemia Seen by nephrology.  Started on CRRT Worsening hemodynamics overnight currently on norepinephrine and vasopressin Hypothermia noted with initiation of CRRT Increasing  leukocytosis 2/24 blood cultures no growth 2/21 blood cultures no growth ABG this morning with pH 7.16  Intubated, unable to obtain.  Discussed with wife and RN   OBJECTIVE:   Blood pressure 100/77, pulse (!) 128, temperature 97.7 F (36.5 C), resp. rate 18, height 5\' 6"  (1.676 m), weight 103.3 kg, SpO2 99 %. Body mass index is 36.76 kg/m.  Physical Exam General - Intubated, non responsive, on CRRT Chest - symmetric chest rise and fall Neck - left CVC IJ, right IJ HD line Abdomen - soft, non distended Extremities - edematous, anasarca   Lab Results: Lab Results  Component Value Date   WBC 28.5 (H) 04/28/2020   HGB 7.8 (L) 04/28/2020   HCT 23.0 (L) 04/28/2020   MCV 98.9 04/28/2020   PLT 124 (L) 04/28/2020    Lab Results  Component Value Date   NA 143 04/28/2020   K 4.8 04/28/2020   CO2 16 (L) 04/28/2020   GLUCOSE 26 (LL) 04/28/2020   BUN 96 (H) 04/28/2020   CREATININE 2.98 (H) 04/28/2020   CALCIUM 7.1 (L) 04/28/2020   GFRNONAA 24 (L) 04/28/2020   GFRAA 91 08/04/2019    Lab Results  Component Value Date   ALT 8 04/28/2020   AST 69 (H) 04/28/2020   ALKPHOS 145 (H) 04/28/2020   BILITOT 7.6 (H) 04/28/2020    No results found for: CRP  No results found for: ESRSEDRATE   I have reviewed the micro and lab results in Epic.  Imaging: DG CHEST PORT 1 VIEW  Result Date: 04/27/2020 CLINICAL DATA:  Central line placement EXAM: PORTABLE CHEST 1 VIEW COMPARISON:  Chest radiograph from one day prior. FINDINGS: Endotracheal tube tip is 3.1 cm above the carina.  Enteric tube enters stomach with the tip not seen on this image. Right internal jugular central venous catheter terminates in the middle third of the SVC. Left internal jugular central venous catheter terminates at the cavoatrial junction. Stable cardiomediastinal silhouette with normal heart size. No pneumothorax. No pleural effusion. No pulmonary edema. Low lung volumes. Mild right basilar atelectasis. IMPRESSION:  1. Well-positioned support structures.  No pneumothorax. 2. Low lung volumes with mild right basilar atelectasis. Electronically Signed   By: Delbert Phenix M.D.   On: 04/27/2020 15:22   DG CHEST PORT 1 VIEW  Result Date: 04/26/2020 CLINICAL DATA:  Central line placement EXAM: PORTABLE CHEST 1 VIEW COMPARISON:  04/24/2020 FINDINGS: Single frontal view of the chest demonstrates endotracheal tube overlying tracheal air column tip at thoracic inlet. Enteric catheter passes below diaphragm tip excluded by collimation. With internal jugular central venous catheter tip overlies the right atrium. The cardiac silhouette is unremarkable. No airspace disease, effusion, or pneumothorax. No acute bony abnormalities. IMPRESSION: 1. No complication after left internal jugular catheter placement. 2. No acute intrathoracic process. Electronically Signed   By: Sharlet Salina M.D.   On: 04/26/2020 19:42     Imaging  independently reviewed in Epic.    Vedia Coffer for Infectious Disease [facility] [phone] pager 04/28/2020, 8:39 AM

## 2020-04-28 NOTE — Progress Notes (Signed)
LTM EEG discontinued - no skin breakdown at unhook.   

## 2020-04-28 NOTE — Progress Notes (Signed)
NAME:  Zachary Woods, MRN:  [id number], DOB:  22-Nov-1965, LOS: 8 ADMISSION DATE:  04/18/2020, CHIEF COMPLAINT: AMS in the setting of acute stroke   Brief History:  55 year old male who was admitted on 2/18 with left MCA infarction and small frontal SAH. Required intubation for progressive AMS.  History of Present Illness:  Zachary Woods is a 55 year old male with pertinent PMH of HTN, CVA, PFO s/p closure in 2018, DM 2, and GERD who presented to the ED with right sided weakness. He was last in his normal state of health the day prior. Overnight, the patient got up to go to the bathroom and could not move his right side and fell to the ground.   In ED, CT head showed small volume SAH. CTA/CTP demonstrated moderate sized left MCA territory ischemic area.   Patient's AMS continued to progress and needed to be intubated. PCCM was subsequently consulted and patient was transferred/ admitted to Neuro CCU. Per note, neurology is worried about septic emboli and recommend a TEE looking for endocarditis and vegetative valvular lesions. Empiric Vanc/ Cefepime.   Past Medical History:  has Hypertension; GERD (gastroesophageal reflux disease); Malignant hypertension; Cerebral embolism with cerebral infarction; Stroke determined by clinical assessment (HCC); PFO (patent foramen ovale); Acute CVA (cerebrovascular accident) (HCC); Subarachnoid hemorrhage (HCC); Type 2 diabetes mellitus with hyperosmolar hyperglycemic state (HHS) (HCC); AKI (acute kidney injury) (HCC); SIRS (systemic inflammatory response syndrome) (HCC); Metabolic acidosis with increased anion gap and accumulation of organic acids; Hypomagnesemia; Hypokalemia; Thrombocytopenia (HCC); and Stroke (HCC) on their problem list.    Significant Hospital Events:  2/18 > admit, intubated  Consults:  Cardiology, Cardio-Thoracic and Neurology   Procedures:    Significant Diagnostic Tests:  CT / CTA / CTP head and neck 2/18 > small volume right  frontal SAH, left MCA occlusion, ischemic area of 37cc with core infarct 21cc, 60% prox left ICA stenosis. MRI brain 2/18 > mod to large acute left MCA infarct, 1.8cm acute cortical / subcortical left occipital lobe infarct, small volume right frontal lobe SAH. Echo 2/18 > EF 60 - 65%, no source of embolism detected. LE duplex 2/18 > neg. Carotid US 2/18 > 1 - 39% stenosis left ICA. EEG 2/18 > mod to diffuse encephalopathy without seizures or epileptiform discharges. Chest x-ray 2/19- poor inspiratory effort but otherwise normal TEE 2/22 >  Micro Data:  COVID 2/18 > neg. Flu 2/18 > neg. Blood cultures 2/18- MSSA positive. 2/19 negative.   Antimicrobials:  Naficillin Interim History / Subjective:  Patient remains sedated and does not respond to voice or painful stimuli. Having some active seizure activity almost hourly. No other changes in mental status overnight and has been having some loose stools.  Objective   Blood pressure 93/72, pulse (!) 127, temperature 97.7 F (36.5 C), resp. rate 18, height  (1.676 m), weight 103.3 kg, SpO2 98 %. CVP:  [8 mmHg] 8 mmHg  Vent Mode: PRVC FiO2 (%):  [30 %-50 %] 50 % Set Rate:  [18 bmp-22 bmp] 22 bmp Vt Set:  [510 mL] 510 mL PEEP:  [5 cmH20-8 cmH20] 8 cmH20 Plateau Pressure:  [9 cmH20-27 cmH20] 27 cmH20   Intake/Output Summary (Last 24 hours) at 04/28/2020 1035 Last data filed at 04/28/2020 1000 Gross per 24 hour  Intake 7501.72 ml  Output 3464 ml  Net 4037.72 ml   Filed Weights   04/25/20 0500 04/27/20 1552 04/28/20 0400  Weight: 87.6 kg 96 kg 103.3 kg  Examination: General: Sedated in NAD. EEG leads in place HENT: intubated Lungs: Clear to auscultation. No wheezes or crackles Cardiovascular: Tachycardic, but normal rhythm. No M/R/G  Neuro: does not awaken to voice or tactile stimuli Skin: Positive for numerous janeway lesions on UE bilaterally and left LE.  GU: Catheter in place   Resolved Hospital Problem list      Assessment & Plan:  Critically ill due to Left MCA stroke with left MCA branch occlusion with small right frontal subarachnoid. Multifactorial encephalopathy:  Extubation not indicated  - Vimpat 100 mg, Keppra at 500mg  IV Q12h, and Valproic acid 500mg  TID  - Continuous EEG monitoring  - Consider re-imaging due to lack of improvement in mental status, off propofol for now.appreciate neurology recommendations.  Infective endocarditis: MSSA bacteremia, Bacterial Meningitis- possible skin source, sepsis: Improving - Continue Naficillin as per ID recs for better CNS coverage - Continue Rifampin per ID for PVE   Atrial Fibrillation: - Amiodarone 60mg /hr IV    Renal failure: (Cr: 4.42, BUN: 161) +20L from admit - Consult Nephrology  - Start CRRT  Uncontrolled type 2 diabetes; Stable  - Continue TF  - Novolog for TF coverage to 10 units Q4h  - Add 40 units Lantus BID   Hypotensinon: Stable  - Titrate Levophed as needed. Goal SBP > 100 and MAP > 65  Hypernatremia:  - Continue free water to 200 mL Q2h   Hypokalemia: Resolved  - Monitor BMP   Constipation- No BM since 2/19: Improved - Hold bowel regimen   Loose Stool possible C. Difficile: - Send C.Diff panel for increased watery stools in setting of increased WBC and Tmax 103   Best practice (evaluated daily)  Pain/Anxiety/Delirium protocol (if indicated): None Neuro vitals: every 2 hours AED's: Keppra and Valproic Acid  VAP protocol (if indicated): Intubated, maintain VAP protocols Respiratory support goals: Titrate oxygen to keep sats greater than 88% Blood pressure target: Keep systolic above 100 DVT prophylaxis: Heparin 3 times daily Nutrition Status:  HOLD for TEE GI prophylaxis: Not indicated Fluid status goals: Allow autoregulation at this time Urinary catheter: Use external catheter Central lines: No central line Glucose control: Type 2 diabetes. Still hyperglycemic. Increasing lantus.  Mobility/therapy needs:  Bedrest Antibiotic de-escalation: Naficillin Home medication reconciliation: On hold Daily labs: CBC BMP Code Status: Full code Family Communication: Wife updated over the phone 2/25 regarding need to started dialysis.  Disposition: ICU  Labs   CBC: Recent Labs  Lab 04/24/20 0556 04/25/20 0521 04/26/20 0159 04/26/20 1920 04/27/20 0415 04/28/20 0344 04/28/20 0500 04/28/20 0747  WBC 7.6 7.5 9.6  --  12.8*  --  28.5*  --   NEUTROABS  --   --  8.1*  --  10.8*  --  23.9*  --   HGB 10.1* 10.8* 10.4* 9.5* 9.3* 8.5* 9.2* 7.8*  HCT 28.2* 30.7* 29.9* 28.0* 26.0* 25.0* 27.2* 23.0*  MCV 97.6 97.2 95.5  --  95.6  --  98.9  --   PLT 63* 76* 84*  --  92*  --  124*  --     Basic Metabolic Panel: Recent Labs  Lab 04/22/20 1604 04/22/20 1706 04/04/2020 0405 04/25/2020 1630 04/24/20 0556 04/25/20 0521 04/26/20 0159 04/26/20 1920 04/27/20 0415 04/27/20 1700 04/28/20 0344 04/28/20 0500 04/28/20 0601 04/28/20 0747  NA  --    < > 145  --  148*   < > 152*   < > 147* 146* 143 142 142 143  K  --    < >  3.8  --  3.4*   < > 3.7   < > 3.9 3.8 4.6 5.2* 5.4* 4.8  CL  --   --  116*  --  111   < > 115*  --  110 109  --  107 106  --   CO2  --   --  17*  --  22   < > 18*  --  17* 16*  --  15* 16*  --   GLUCOSE  --   --  289*  --  274*   < > 301*  --  361* 174*  --  35* 26*  --   BUN  --   --  66*  --  85*   < > 151*  --  161* 159*  --  102* 96*  --   CREATININE  --   --  1.62*  --  1.92*   < > 3.45*  --  4.42* 4.34*  --  3.03* 2.98*  --   CALCIUM  --   --  7.8*  --  7.5*   < > 7.4*  --  6.5* 6.6*  --  7.0* 7.1*  --   MG 2.6*  --  2.6* 2.8* 2.8*  --   --   --   --   --   --  3.1*  --   --   PHOS 3.4  --  2.2* 3.1 5.5*  --   --   --   --  9.4*  --  9.7* 9.9*  --    < > = values in this interval not displayed.   GFR: Estimated Creatinine Clearance: 31.9 mL/min (A) (by C-G formula based on SCr of 2.98 mg/dL (H)). Recent Labs  Lab 04/21/20 1815 04/22/20 0345 04/22/20 1112 04/18/2020 0405  04/25/20 0521 04/26/20 0159 04/27/20 0415 04/28/20 0500  WBC  --    < >  --    < > 7.5 9.6 12.8* 28.5*  LATICACIDVEN 4.1*  --  2.9*  --   --   --   --   --    < > = values in this interval not displayed.    Liver Function Tests: Recent Labs  Lab 04/26/20 0159 04/27/20 0415 04/27/20 1700 04/28/20 0500 04/28/20 0601  AST 53* 45*  --  69*  --   ALT <5 7  --  8  --   ALKPHOS 96 91  --  145*  --   BILITOT 6.8* 6.4*  --  7.6*  --   PROT 5.0* 5.1*  --  5.8*  --   ALBUMIN 1.3* 1.1* 1.1* 1.2* 1.5*   No results for input(s): LIPASE, AMYLASE in the last 168 hours. No results for input(s): AMMONIA in the last 168 hours.  ABG    Component Value Date/Time   PHART 7.169 (LL) 04/28/2020 0747   PCO2ART 57.1 (H) 04/28/2020 0747   PO2ART 146 (H) 04/28/2020 0747   HCO3 20.8 04/28/2020 0747   TCO2 22 04/28/2020 0747   ACIDBASEDEF 7.0 (H) 04/28/2020 0747   O2SAT 98.0 04/28/2020 0747     Coagulation Profile: Recent Labs  Lab 04/27/20 0415  INR 1.5*    Cardiac Enzymes: No results for input(s): CKTOTAL, CKMB, CKMBINDEX, TROPONINI in the last 168 hours.  HbA1C: Hgb A1c MFr Bld  Date/Time Value Ref Range Status  04/25/2020 12:17 PM 10.5 (H) 4.8 - 5.6 % Final    Comment:    (NOTE) Pre diabetes:  5.7%-6.4%  Diabetes:              >6.4%  Glycemic control for   <7.0% adults with diabetes   08/04/2019 05:25 PM 8.2 (H) 4.8 - 5.6 % Final    Comment:             Prediabetes: 5.7 - 6.4          Diabetes: >6.4          Glycemic control for adults with diabetes: <7.0     CBG: Recent Labs  Lab 04/27/20 2309 04/28/20 0315 04/28/20 0340 04/28/20 0635 04/28/20 0718  GLUCAP 144* 58* 91 110* 68*    Critical care time:     Andreas Newport, MS4   The patient is critically ill due to respiratory failure, septic shock, endocarditis, renal failure.  Critical care was necessary to treat or prevent imminent or life-threatening deterioration.  Critical care was time spent  personally by me on the following activities: development of treatment plan with patient and/or surrogate as well as nursing, discussions with consultants, evaluation of patient's response to treatment, examination of patient, obtaining history from patient or surrogate, ordering and performing treatments and interventions, ordering and review of laboratory studies, ordering and review of radiographic studies, pulse oximetry, re-evaluation of patient's condition and participation in multidisciplinary rounds.   Critical Care Time devoted to patient care services described in this note is 54 minutes. This time reflects time of care of this signee Charlott Holler . This critical care time does not reflect separately billable procedures or procedure time, teaching time or supervisory time of PA/NP/Med student/Med Resident etc but could involve care discussion time.       Mickel Baas Pulmonary and Critical Care Medicine 04/28/2020 10:35 AM  Pager: see AMION After hours pager: [phone]  If no response to pager , please call [phone] until 7pm After 7:00 pm call Elink  [phone]     NAME:  KEDRICK MCNAMEE, MRN:  [id number], DOB:  [birth year], LOS: 8 ADMISSION DATE:  04/07/2020, CHIEF COMPLAINT: AMS in the setting of acute stroke   Brief History:  55 year old male who was admitted on 2/18 with left MCA infarction and small frontal SAH. Required intubation for progressive AMS.  History of Present Illness:  Zachary Woods is a 55 year old male with pertinent PMH of HTN, CVA, PFO s/p closure in 2018, DM 2, and GERD who presented to the ED with right sided weakness. He was last in his normal state of health the day prior. Overnight, the patient got up to go to the bathroom and could not move his right side and fell to the ground.   In ED, CT head showed small volume SAH. CTA/CTP demonstrated moderate sized left MCA territory ischemic area.   Patient's AMS continued to progress and needed  to be intubated. PCCM was subsequently consulted and patient was transferred/ admitted to Neuro CCU. Per note, neurology is worried about septic emboli and recommend a TEE looking for endocarditis and vegetative valvular lesions. Empiric Vanc/ Cefepime.   Past Medical History:  has Hypertension; GERD (gastroesophageal reflux disease); Malignant hypertension; Cerebral embolism with cerebral infarction; Stroke determined by clinical assessment (HCC); PFO (patent foramen ovale); Acute CVA (cerebrovascular accident) (HCC); Subarachnoid hemorrhage (HCC); Type 2 diabetes mellitus with hyperosmolar hyperglycemic state (HHS) (HCC); AKI (acute kidney injury) (HCC); SIRS (systemic inflammatory response syndrome) (HCC); Metabolic acidosis with increased anion gap and accumulation of organic acids; Hypomagnesemia; Hypokalemia; Thrombocytopenia (HCC); and  Stroke [facility]) on their problem list.    Significant Hospital Events:  2/18 > admit, intubated  Consults:  Cardiology, Cardio-Thoracic and Neurology   Procedures:    Significant Diagnostic Tests:  CT / CTA / CTP head and neck 2/18 > small volume right frontal SAH, left MCA occlusion, ischemic area of 37cc with core infarct 21cc, 60% prox left ICA stenosis. MRI brain 2/18 > mod to large acute left MCA infarct, 1.8cm acute cortical / subcortical left occipital lobe infarct, small volume right frontal lobe SAH. Echo 2/18 > EF 60 - 65%, no source of embolism detected. LE duplex 2/18 > neg. Carotid US 2/18 > 1 - 39% stenosis left ICA. EEG 2/18 > mod to diffuse encephalopathy without seizures or epileptiform discharges. Chest x-ray 2/19- poor inspiratory effort but otherwise normal TEE 2/22 >  Micro Data:  COVID 2/18 > neg. Flu 2/18 > neg. Blood cultures 2/18- MSSA positive. 2/19 negative.   Antimicrobials:  Naficillin Interim History / Subjective:  Worsened overnight with worsening acidosis, tachycardia, hypotension. CRRT started and running, but  unable to pull any fluid off. Still with epileptiform discharges on EEG. Wife at bedside this am.   Objective   Blood pressure 93/72, pulse (!) 127, temperature 97.7 F (36.5 C), resp. rate 18, height  (1.676 m), weight 103.3 kg, SpO2 98 %. CVP:  [8 mmHg] 8 mmHg  Vent Mode: PRVC FiO2 (%):  [30 %-50 %] 50 % Set Rate:  [18 bmp-22 bmp] 22 bmp Vt Set:  [510 mL] 510 mL PEEP:  [5 cmH20-8 cmH20] 8 cmH20 Plateau Pressure:  [9 cmH20-27 cmH20] 27 cmH20   Intake/Output Summary (Last 24 hours) at 04/28/2020 1040 Last data filed at 04/28/2020 1000 Gross per 24 hour  Intake 7501.72 ml  Output 3464 ml  Net 4037.72 ml   Filed Weights   04/25/20 0500 04/27/20 1552 04/28/20 0400  Weight: 87.6 kg 96 kg 103.3 kg    Examination: General: Sedated in NAD. EEG leads in place HENT: intubated Lungs: Clear to auscultation. No wheezes or crackles Cardiovascular: Tachycardic, but normal rhythm. No M/R/G  Neuro: does not awaken to voice or tactile stimuli Skin: Positive for numerous janeway lesions on UE bilaterally and left LE.  GU: Catheter in place   Resolved Hospital Problem list     Assessment & Plan:  Critically ill due to Left MCA stroke with left MCA branch occlusion with small right frontal subarachnoid. Multifactorial encephalopathy:  Extubation not indicated  - Vimpat 100 mg, Keppra at  IV Q12h, and Valproic acid  TID  - Continuous EEG monitoring  - Consider re-imaging due to lack of improvement in mental status, off propofol for now.appreciate neurology recommendations.  Infective endocarditis: MSSA bacteremia, Bacterial Meningitis- possible skin source, septic shock Worsening - on norepinephrine, vasopressin max dose. Worsening overnight. Discussed with wife at bedside. Made DNR.  - Continue Naficillin as per ID recs for better CNS coverage - Continue Rifampin per ID for PVE   Atrial Fibrillation: - continue amiodarone gtt  Renal failure: (Cr: 4.42, BUN: 161) +20L  from admit - Appreciate Consult Nephrology  - continue CRRT  Uncontrolled type 2 diabetes; labile BGs - Continue TF  - Novolog for TF coverage to 10 units Q4h  - hypoglycemic overnight. Will reduce lantus  Multiple electrolyte abnormalities Anion gap metabolic acidosis Hypernatremia: Hyperkalemia Hypocalcemia - correcting with dialysis. Will give calcium gluconate - Monitor BMP   Watery stools - C. Difficile ag and tox negative.    Best  practice (evaluated daily)  Pain/Anxiety/Delirium protocol (if indicated): None Neuro vitals: every 2 hours AED's: Keppra and Valproic Acid  VAP protocol (if indicated): Intubated, maintain VAP protocols DVT prophylaxis: Heparin 3 times daily Nutrition Status:  tube feeds GI prophylaxis: Not indicated Urinary catheter: foley Glucose control: hypoglycemic, reduced insulin Mobility/therapy needs: Bedrest Antibiotic de-escalation: Naficillin Home medication reconciliation: On hold Daily labs: CBC BMP Code Status: DNR Family Communication: Updated wife at bedside about poor prognosis. She has made him a DNR. Expect he might expire despite all aggressive measures. She is calling the rest of his family in.  Disposition: ICU  Labs   CBC: Recent Labs  Lab 04/24/20 0556 04/25/20 0521 04/26/20 0159 04/26/20 1920 04/27/20 0415 04/28/20 0344 04/28/20 0500 04/28/20 0747  WBC 7.6 7.5 9.6  --  12.8*  --  28.5*  --   NEUTROABS  --   --  8.1*  --  10.8*  --  23.9*  --   HGB 10.1* 10.8* 10.4* 9.5* 9.3* 8.5* 9.2* 7.8*  HCT 28.2* 30.7* 29.9* 28.0* 26.0* 25.0* 27.2* 23.0*  MCV 97.6 97.2 95.5  --  95.6  --  98.9  --   PLT 63* 76* 84*  --  92*  --  124*  --     Basic Metabolic Panel: Recent Labs  Lab 04/22/20 1604 04/22/20 1706 04/06/2020 0405 04/28/2020 1630 04/24/20 0556 04/25/20 0521 04/26/20 0159 04/26/20 1920 04/27/20 0415 04/27/20 1700 04/28/20 0344 04/28/20 0500 04/28/20 0601 04/28/20 0747  NA  --    < > 145  --  148*   < > 152*    < > 147* 146* 143 142 142 143  K  --    < > 3.8  --  3.4*   < > 3.7   < > 3.9 3.8 4.6 5.2* 5.4* 4.8  CL  --   --  116*  --  111   < > 115*  --  110 109  --  107 106  --   CO2  --   --  17*  --  22   < > 18*  --  17* 16*  --  15* 16*  --   GLUCOSE  --   --  289*  --  274*   < > 301*  --  361* 174*  --  35* 26*  --   BUN  --   --  66*  --  85*   < > 151*  --  161* 159*  --  102* 96*  --   CREATININE  --   --  1.62*  --  1.92*   < > 3.45*  --  4.42* 4.34*  --  3.03* 2.98*  --   CALCIUM  --   --  7.8*  --  7.5*   < > 7.4*  --  6.5* 6.6*  --  7.0* 7.1*  --   MG 2.6*  --  2.6* 2.8* 2.8*  --   --   --   --   --   --  3.1*  --   --   PHOS 3.4  --  2.2* 3.1 5.5*  --   --   --   --  9.4*  --  9.7* 9.9*  --    < > = values in this interval not displayed.   GFR: Estimated Creatinine Clearance: 31.9 mL/min (A) (by C-G formula based on SCr of 2.98 mg/dL (H)). Recent Labs  Lab 04/21/20 1815  04/22/20 0345 04/22/20 1112 04/16/2020 0405 04/25/20 0521 04/26/20 0159 04/27/20 0415 04/28/20 0500  WBC  --    < >  --    < > 7.5 9.6 12.8* 28.5*  LATICACIDVEN 4.1*  --  2.9*  --   --   --   --   --    < > = values in this interval not displayed.    Liver Function Tests: Recent Labs  Lab 04/26/20 0159 04/27/20 0415 04/27/20 1700 04/28/20 0500 04/28/20 0601  AST 53* 45*  --  69*  --   ALT <5 7  --  8  --   ALKPHOS 96 91  --  145*  --   BILITOT 6.8* 6.4*  --  7.6*  --   PROT 5.0* 5.1*  --  5.8*  --   ALBUMIN 1.3* 1.1* 1.1* 1.2* 1.5*   No results for input(s): LIPASE, AMYLASE in the last 168 hours. No results for input(s): AMMONIA in the last 168 hours.  ABG    Component Value Date/Time   PHART 7.169 (LL) 04/28/2020 0747   PCO2ART 57.1 (H) 04/28/2020 0747   PO2ART 146 (H) 04/28/2020 0747   HCO3 20.8 04/28/2020 0747   TCO2 22 04/28/2020 0747   ACIDBASEDEF 7.0 (H) 04/28/2020 0747   O2SAT 98.0 04/28/2020 0747     Coagulation Profile: Recent Labs  Lab 04/27/20 0415  INR 1.5*    Cardiac  Enzymes: No results for input(s): CKTOTAL, CKMB, CKMBINDEX, TROPONINI in the last 168 hours.  HbA1C: Hgb A1c MFr Bld  Date/Time Value Ref Range Status  05/02/2020 12:17 PM 10.5 (H) 4.8 - 5.6 % Final    Comment:    (NOTE) Pre diabetes:          5.7%-6.4%  Diabetes:              >6.4%  Glycemic control for   <7.0% adults with diabetes   08/04/2019 05:25 PM 8.2 (H) 4.8 - 5.6 % Final    Comment:             Prediabetes: 5.7 - 6.4          Diabetes: >6.4          Glycemic control for adults with diabetes: <7.0     CBG: Recent Labs  Lab 04/27/20 2309 04/28/20 0315 04/28/20 0340 04/28/20 0635 04/28/20 0718  GLUCAP 144* 58* 91 110* 68*    Critical care time:     The patient is critically ill due to respiratory failure, septic shock, encephalopathy.  Critical care was necessary to treat or prevent imminent or life-threatening deterioration.  Critical care was time spent personally by me on the following activities: development of treatment plan with patient and/or surrogate as well as nursing, discussions with consultants, evaluation of patient's response to treatment, examination of patient, obtaining history from patient or surrogate, ordering and performing treatments and interventions, ordering and review of laboratory studies, ordering and review of radiographic studies, pulse oximetry, re-evaluation of patient's condition and participation in multidisciplinary rounds.   Critical Care Time devoted to patient care services described in this note is 55 minutes. This time reflects time of care of this signee Charlott Holler . This critical care time does not reflect separately billable procedures or procedure time, teaching time or supervisory time of PA/NP/Med student/Med Resident etc but could involve care discussion time.       Mickel Baas Pulmonary and Critical Care Medicine 04/28/2020 10:40 AM  Pager:  see AMION After hours pager: [phone]  If no response to  pager , please call [phone] until 7pm After 7:00 pm call Elink  [phone]

## 2020-04-28 NOTE — Progress Notes (Signed)
eLink Physician-Brief Progress Note Patient Name: Zachary Woods DOB: 08-04-1965 MRN: [id number]   Date of Service  04/28/2020  HPI/Events of Note  ABG on 30^%/PRVC 18TV 510/P 5 = 7.282/32.2/51. 2. Hypotension - BP = 91/56 with AAP = 67. CVP + 8. Albumin = 1.1.  eICU Interventions  Plan: 1. Increase PRVC rate to 22. 2. Increase FiO@ to 50% and PEEP to 8. 3. 25% Albumin 25 mg IV now.  4. Repeat ABG at 7:30 AM.     Intervention Category Major Interventions: Hypotension - evaluation and management;Respiratory failure - evaluation and management;Acid-Base disturbance - evaluation and management  Brandon Wiechman Dennard Nip 04/28/2020, 4:49 AM

## 2020-04-28 NOTE — Procedures (Addendum)
Patient Name:Zachary Woods YVO:[id number] Epilepsy Attending:Soraiya Ahner Annabelle Harman Referring Physician/Provider:Dr Lynnell Catalan Duration:04/27/2020 1343 to 2/26/20220940  Patient history:55 y.o.malewith history of previous cryptogenic Left MCA stroke s/p PFO closure, lost to stroke follow up 2018, DM2, HTN, GERDpresenting withaphasia, right facial droop and right sided weakness noted upon waking up at 0300 this morning.EEG to evaluate for seizure  Level of alertness: comatose  AEDs during EEG study:LEV, VPA, LCM  Technical aspects: This EEG study was done with scalp electrodes positioned according to the 10-20 International system of electrode placement. Electrical activity was acquired at a sampling rate of 500Hz  and reviewed with a high frequency filter of 70Hz  and a low frequency filter of 1Hz . EEG data were recorded continuously and digitally stored.   Description: EEG showed continuous generalized 3 to 6 Hz theta-delta slowing. Rare Spikes were seen in left temporal region.  ABNORMALITY -Spike, left temporal region -Continuousslow, generalized  IMPRESSION: This studyshowedepileptogenicity arising from left temporo-parietal region.Additionally there issevere  diffuse encephalopathy, nonspecific etiology.No seizures were seen during this study.  [name] 

## 2020-04-28 NOTE — Progress Notes (Signed)
[facility] Progress Note  Name: Zachary Woods MRN: [id number] DOB: 1965-09-21  Subjective:  Seen and examined on CRRT; procedure supervised.  on levo at 56 mcg/min and on vaso.  Not removing fluid with CRRT currently per instability.  Had 0.5 kg UOP over 2/25 and had 2.2 kg UF over 2/25 with CRRT.  Note bicarb gtt was ordered as well.   Review of systems:  Unable to obtain 2/2 intubated   ------------------ Background on consult:  Zachary Woods is a 55 y.o. male with a history of diabetes, hypertension, CVA, and GERD who presented to the hospital with right sided weakness on 2/18.  He was found to have acute stroke - left MCA.  Course has been complicated by MSSA bacteremia and endocarditis.  Per report felt to have developed vegetation on closure device for a PFO placed a few years ago.  He has been hypotensive requiring levo and vasopressin.  Course has also been complicated by acute kidney injury with a BUN of up to 161 and Cr 4.42 today.  Nephrology is consulted for assistance with management of AKI.  Got CT angio on 2/18.  Home meds include ibuprofen, meloxicam and HCTZ.  Renal US with no hydro and normal echogenicity.  Noted incidental finding of contour nodularity of liver suggesting cirrhosis.  He had 725 mL Uop over 2/24.  He has been on levo at 25 mcg/min and vaso.  Spoke with his wife via phone and discussed risks/benefits/indications for renal replacement therapy and she consented for renal replacement therapy.     Intake/Output Summary (Last 24 hours) at 04/28/2020 0841 Last data filed at 04/28/2020 0818 Gross per 24 hour  Intake 7501.72 ml  Output 3399 ml  Net 4102.72 ml    Vitals:  Vitals:   04/28/20 0600 04/28/20 0700 04/28/20 0733 04/28/20 0800  BP: (!) 72/58 93/75 (!) 129/113 100/77  Pulse: (!) 31 (!) 135 (!) 134 (!) 128  Resp: (!) 21 (!) 26 (!) 25 18  Temp: (!) 97.52 F (36.4 C) 97.88 F (36.6 C) 97.88 F (36.6 C) 97.7 F (36.5 C)  TempSrc:       SpO2: 95% 97% 92% 99%  Weight:      Height:         Physical Exam:   General:  Adult male intubated  HEENT: NCAT  Neck: trachea midline; normal circumference Heart: S1S2 no rub; tachycardic to the 130's Lungs: coarse mechanical breath sounds and crackles Abdomen: softly distended  Extremities: 1+ upper and lower extremity edema Skin: discoloration of toes  Neuro: sedation running  GU scrotal edema  Medications reviewed   Labs:  BMP Latest Ref Rng & Units 04/28/2020 04/28/2020 04/28/2020  Glucose 70 - 99 mg/dL - 53(ZJ) 67(HA)  BUN 6 - 20 mg/dL - 19(F) 790(W)  Creatinine 0.61 - 1.24 mg/dL - 4.09(B) 3.53(G)  BUN/Creat Ratio 9 - 20 - - -  Sodium 135 - 145 mmol/L 143 142 142  Potassium 3.5 - 5.1 mmol/L 4.8 5.4(H) 5.2(H)  Chloride 98 - 111 mmol/L - 106 107  CO2 22 - 32 mmol/L - 16(L) 15(L)  Calcium 8.9 - 10.3 mg/dL - 7.1(L) 7.0(L)    Assessment/Plan:   # AKI  - Secondary to ischemic and pre-renal ATN with acute stroke, shock, contrast.  May have also had septic emboli to kidneys.  Baseline Cr around 1. Renal US no hydro, normal echogenicity.  UA with UTI and 30 mg/dl and 99-24 RBC.  Note up/cr ratio 1970  mg/g - CRRT. Increase dialysate to 2.2 liters/hr and change to 2K.  Unable to tolerate UF with instability - keep even as tolerated   # Septic shock  - with bacteremia and endocarditis   - pressors per primary team   # Hypoglycemia - continue bicarb in D5W  # MSSA bacteremia - with endocarditis - abx per primary team   # Acute CVA  - per neuro    # Metabolic acidosis  - septic shock and AKI  - Continue RRT  - needs dextrose and decompensated - ok to continue bicarb in D5W  # Hypernatremia - free water deficit - improving   # Hypocalcemia  - improves with correction for albumin  - calcium 1 gram IV once  # Anemia normocytic - no ESA with recent CVA; iron deficient - defer IV iron in setting of infection  # Afib with RVR - on amio gtt  # Seizure  activity - EEG monitoring per neuro - per neuro  - azotemia may be contributing  Estanislado Emms, MD 04/28/2020 9:01 AM

## 2020-04-28 NOTE — Progress Notes (Signed)
RT reported critical results to RN and MD @0750 . ABG as follows pH 7.16, CO2 57.1, PO2 146, HCO3 20.8. No new orders for RT at this time. RT will continue to monitor.

## 2020-04-28 NOTE — Progress Notes (Signed)
PT Cancellation Note  Patient Details Name: Zachary Woods MRN: [id number] DOB: 22-Jun-1965   Cancelled Treatment:    Reason Eval/Treat Not Completed: Patient not medically ready at this time, remains intubated, sedated, and with progressively worsening status. Following discusison with RN, PT will sign off at this time. Please feel free to re-consult if change in status.   [name], DPT   Acute Rehabilitation Department Pager #: [phone]   [name] 04/28/2020, 11:24 AM

## 2020-04-28 NOTE — Progress Notes (Signed)
eLink Physician-Brief Progress Note Patient Name: Zachary Woods DOB: [birth year] MRN: [id number]   Date of Service  04/28/2020  HPI/Events of Note  Hypotension - BP = 89/78 with MAP = 83.  eICU Interventions  Plan: 1. Increase the ceiling on the Norepinephrine IV infusion to 60 mcg/min. 2. ABG STAT. 3. Monitor CVP now and Q 4 hours.      Intervention Category Major Interventions: Hypotension - evaluation and management  [name] Eugene 04/28/2020, 3:06 AM

## 2020-04-28 NOTE — Progress Notes (Addendum)
STROKE TEAM PROGRESS NOTE   INTERVAL HISTORY Per bedside discussion with RN patient hypothermic to 95.3, hypoglycemic and hypotensive overnight. CCM adding phenylephrine gtt.  FiO2 increased to 50%. CRRT in progress.   Wife at bedside. She is tearful and expresses sadness that he is not responding as she would hope to treatment. Questions answered.   Vitals:   04/28/20 0452 04/28/20 0500 04/28/20 0600 04/28/20 0700  BP:  102/62 (!) 72/58 93/75  Pulse:  82 (!) 31 (!) 135  Resp:  (!) 21 (!) 21 (!) 26  Temp:  97.88 F (36.6 C) (!) 97.52 F (36.4 C) 97.88 F (36.6 C)  TempSrc:      SpO2: 100% 99% 95% 97%  Weight:      Height:       CBC:  Recent Labs  Lab 04/27/20 0415 04/28/20 0344 04/28/20 0500  WBC 12.8*  --  28.5*  NEUTROABS 10.8*  --  23.9*  HGB 9.3* 8.5* 9.2*  HCT 26.0* 25.0* 27.2*  MCV 95.6  --  98.9  PLT 92*  --  124*   Basic Metabolic Panel:  Recent Labs  Lab 04/24/20 0556 04/25/20 0521 04/28/20 0500 04/28/20 0601  NA 148*   < > 142 142  K 3.4*   < > 5.2* 5.4*  CL 111   < > 107 106  CO2 22   < > 15* 16*  GLUCOSE 274*   < > 35* 26*  BUN 85*   < > 102* 96*  CREATININE 1.92*   < > 3.03* 2.98*  CALCIUM 7.5*   < > 7.0* 7.1*  MG 2.8*  --  3.1*  --   PHOS 5.5*   < > 9.7* 9.9*   < > = values in this interval not displayed.   Lipid Panel:  Recent Labs  Lab 04/24/20 0556  TRIG 187*   HgbA1c:  No results for input(s): HGBA1C in the last 168 hours. Urine Drug Screen:  No results for input(s): LABOPIA, COCAINSCRNUR, LABBENZ, AMPHETMU, THCU, LABBARB in the last 168 hours.  Alcohol Level No results for input(s): ETH in the last 168 hours.  IMAGING past 24 hours DG CHEST PORT 1 VIEW  Result Date: 04/27/2020 CLINICAL DATA:  Central line placement EXAM: PORTABLE CHEST 1 VIEW COMPARISON:  Chest radiograph from one day prior. FINDINGS: Endotracheal tube tip is 3.1 cm above the carina. Enteric tube enters stomach with the tip not seen on this image. Right internal  jugular central venous catheter terminates in the middle third of the SVC. Left internal jugular central venous catheter terminates at the cavoatrial junction. Stable cardiomediastinal silhouette with normal heart size. No pneumothorax. No pleural effusion. No pulmonary edema. Low lung volumes. Mild right basilar atelectasis. IMPRESSION: 1. Well-positioned support structures.  No pneumothorax. 2. Low lung volumes with mild right basilar atelectasis. Electronically Signed   By: Delbert Phenix M.D.   On: 04/27/2020 15:22   PHYSICAL EXAM  Temp:  [95.36 F (35.2 C)-103.2 F (39.6 C)] 97.88 F (36.6 C) (02/26 0700) Pulse Rate:  [30-137] 135 (02/26 0700) Resp:  [16-28] 26 (02/26 0700) BP: (72-110)/(51-82) 93/75 (02/26 0700) SpO2:  [95 %-100 %] 97 % (02/26 0700) Arterial Line BP: (79-112)/(49-64) 101/60 (02/26 0700) FiO2 (%):  [30 %-50 %] 50 % (02/26 0452) Weight:  [96 kg-103.3 kg] 103.3 kg (02/26 0400)  General - Well nourished, well developed, intubated on sedation.  Ophthalmologic - fundi not visualized due to noncooperation.  Cardiovascular - regular rhythm but tachycardia, not  in afib.  Neuro - intubated on sedation, eyes closed, not following commands. With forced eye opening, eyes in mid position, not blinking to visual threat, doll's eyes absent, not tracking, pupil b/l 71mm, sluggish. Corneal reflex weakly present on the left but not on the right, gag and cough present. Breathing over the vent.  Facial symmetry not able to test due to ET tube.  Tongue protrusion not cooperative. On pain stimulation, no movement of all extremities. DTR diminished and no babinski. Sensation, coordination and gait not tested.   ASSESSMENT/PLAN [name] is a 55 y.o. male with history of previous cryptogenic Left MCA stroke s/p PFO closure, lost to stroke follow up 2018, DM2, HTN, GERD presenting with aphasia, right facial droop and right sided weakness noted upon waking up at 0300 this morning. He slid  to the ground from the bed after awakening. EMS was called and he was brought to ED in code stroke status. NIHSS of 23 was noted upon arrival.   Left MCA stroke d/t left MCA branch occlusion with small right frontal SAH - likely due to endocarditis with small mycotic aneurysm  Code Stroke HCT: small single sulcal SAH  CTA- occlusion of a trifurcation branch of the left MCA.  No aneurysm  CTP - Ischemic area 37cc, ischemic core 13ml by CBF<30%, CBV - 52ml  MRI - Moderate to large acute left MCA territory cortical/subcortical infarct affecting the left frontal and parietal lobes, as well as left insula and subinsular region. 1.8 cm acute cortical/subcortical left occipital lobe infarct, a few additional punctate acute infarcts are questioned within the right frontoparietal white matter (versus artifact). Redemonstrated small volume subarachnoid hemorrhage overlying the anterior right frontal lobe.  CT head x 3 - unchanged,  2D Echo EF 60-65%. No shunt. Septal Repair:25 mm Amplazter on 08/01/2016 noted.    TEE Large 1x2 cm vegetation associated with the Amplatzer atrial septal occlusion device  LDL, dierct 15.3  HgbA1c 10.5  Beta-2-glycoprotein repeat 94   DVT PPX: SCDS  On ASA 81mg  PTA, now no antiplatelets due to thrombocytopenia and endocarditis with SAH concerning for mycotic aneurysm  Therapy recommendations:  TBD  Disposition:  TBD  Per Dr. discussion with wife undertaken, patient now DNR. Prognosis is grim.   Seizure  EEG 2/20 potential epileptogenicity arising from left temporal region as well as moderate diffuse encephalopathy  LTM 2/21 multiple seizures without clinical signs arising from left temporoparietal region   LTM 2/22 two seizures without clinical signs arising from left temporoparietal region, improving from previous day  LTM 2/23 epileptogenicity arising from left temporoparietal region. Additionally there ismoderate diffuse encephalopathy,  nonspecific etiology.  LTM 2/24 multiple seizures without clinical signs arising from left temporoparietal region, one seizure every few hours, lasting 1 to 2 minutes each. Moderate diffuse encephalopathy   LTM 2/25 brief ictal-interictal rhythmic discharges arising from left temporal parietal region which is on the ictal-interictal continuum with high potential for seizures.Additionally there ismoderate diffuse encephalopathy  LTM discontinued 2/26  On Keppra 500 bid; valproic acid 500 tid and vimpat 100 bid  Antiphospholipid syndrome  07/2016 beta-2 glycoprotein IgG = 57 in the setting of stroke  11/2016 = 90 - 3 months repeat  04/2020 = 94 in the setting of stroke  Will need long term AC once appropriate  afib RVR, newly developed  Developed overnight 2/22  On amiodarone IV  Cardizem IV x 1 ; Metoprolol prn  May need long term AC once appropriate  Hx of  stroke PFO closure  07/2016 patient had left MCA small infarct.  MRA head and neck negative.  DVT, TTE, pan CT negative.  TCD bubble study showed a small PFO.  TEE positive for PFO.  LDL 68 and A1c 5.6.  THC positive.  Hypercoagulable work-up showed beta-2 glycoprotein IgG 57.  Discharged with aspirin.  PFO closure done in 08/2016.  Repeat TTE showed good PFO closure procedure.  He was put on DAPT and Lipitor after PFO closure.  Repeat beta-2 glycoprotein IgG in 11/2016 was 90.  However, patient was lost follow-up with neurology.  Antiphospholipid syndrome was not diagnosed and was not treated.  MSSA bacteremia Septic shock Bacterial meningitis Endocarditis UTI Fever  Tmax 101.3->103->103.5->102.3->103.2  Leukocytosis 10.8->9.8->7.5->9.6->12.8  Lactic acid 7.5->5.8->4.5->2.9 (final)  Antibiotics narrowed to nafcillin and rifampin  B Cx MSSA, serial Blood cultures NGTD  TEE Large 1x2 cm vegetation associated with the Amplatzer atrial septal occlusion device  Urine Cx - MSSA  CSF WBC 90 (Neutro 71%, lymph 29%),  RBC 57, protein 117, glucose 104 (blood glucose 300s), Cx NGTD  ID on board  C.diff checked and negative 2/25  Management per CCM  Respiratory failure  Tachypnea with respiratory distress  Intubated 2/20  Not candidate for extubation  CCM on board   Hx of HTN hypotension . HTN meds at home: Norvasc 10mg  daily, hctz 12.5mg  daily, not ordered. . Hypotension: On levophed and vasopressin per CCM . On MIVF at 150cc/hr -> 75cc -> FW 200 Q2h -> FW 100 Q4 . Keep BP at least greater than 100 systolic.  Long-term BP goal normotensive  Hyperlipidemia  Home meds: None. Per chart review he stopped statin 2018 d/t myalgia  TG 327   LDL direct 15.3, goal < 70  No statin indicated now given low LDL  Diabetes type II UnControlled  Home meds:  Glucophage 500mg  daily   HgbA1c 10.5, goal < 7.0  Hyperosmolar hyperglycemic on admission  Was on insulin drip -> lantus 30U->40U bid  Persistent hyperglycemia  CBGs  SSI  Close PCP follow up after discharge  Thrombocytopenia  Plt 74->62->45->47->63->76->84->92  Likely reactive to current critical illness  Hepatitis panel non-reactive  ASA discontinued  HOLD antiplatelet meds   on heparin subq Q8   AKI, Renal failure  AKI Cre 1.55->1.23->1.62->1.92->2.93->3.45->4.45  On FW  CCM on board  Nephrology consulted.   2019 abdomen no hydronephrosis  Plan for CRRT   Other Stroke Risk Factors   Former Cigarette smoker  Other Active Problems  Metabolic Acidosis, Initial anion gap 16 with CO2 19, improving with anion gap 12 On sodium bicarb infusion  Hyponatremia->hypernatremia, Na 124->126->135->133->136->140->145->148->149->151->147 - on FW  Case discussed wit  Delila A Bailey-Modzik, NP-C  Patient seen, examined, labs,vitals and notes reviewed. Discussed plan with , NP and agree with assessment and plan as documented above. I have independently reviewed the chart, obtained history,  review of systems and examined the patient.  Electronically signed by:  [place], MD Page: Janey Genta 05/01/2020, 3:57 AM   To contact Stroke Continuity provider, please refer to [phone]. After hours, contact General Neurology

## 2020-04-29 DIAGNOSIS — I639 Cerebral infarction, unspecified: Secondary | ICD-10-CM | POA: Diagnosis not present

## 2020-04-29 DIAGNOSIS — R6521 Severe sepsis with septic shock: Secondary | ICD-10-CM

## 2020-04-29 DIAGNOSIS — N179 Acute kidney failure, unspecified: Secondary | ICD-10-CM

## 2020-04-29 DIAGNOSIS — J9600 Acute respiratory failure, unspecified whether with hypoxia or hypercapnia: Secondary | ICD-10-CM | POA: Diagnosis not present

## 2020-04-29 DIAGNOSIS — I63512 Cerebral infarction due to unspecified occlusion or stenosis of left middle cerebral artery: Secondary | ICD-10-CM

## 2020-04-29 DIAGNOSIS — D6861 Antiphospholipid syndrome: Secondary | ICD-10-CM

## 2020-04-29 DIAGNOSIS — L0291 Cutaneous abscess, unspecified: Secondary | ICD-10-CM | POA: Diagnosis not present

## 2020-04-29 DIAGNOSIS — I63412 Cerebral infarction due to embolism of left middle cerebral artery: Secondary | ICD-10-CM | POA: Diagnosis not present

## 2020-04-29 DIAGNOSIS — I4891 Unspecified atrial fibrillation: Secondary | ICD-10-CM

## 2020-04-29 DIAGNOSIS — A419 Sepsis, unspecified organism: Secondary | ICD-10-CM | POA: Diagnosis not present

## 2020-04-29 DIAGNOSIS — I729 Aneurysm of unspecified site: Secondary | ICD-10-CM

## 2020-04-29 DIAGNOSIS — I959 Hypotension, unspecified: Secondary | ICD-10-CM

## 2020-04-29 DIAGNOSIS — A4101 Sepsis due to Methicillin susceptible Staphylococcus aureus: Secondary | ICD-10-CM | POA: Diagnosis not present

## 2020-04-29 DIAGNOSIS — I330 Acute and subacute infective endocarditis: Secondary | ICD-10-CM | POA: Diagnosis not present

## 2020-04-29 LAB — RENAL FUNCTION PANEL
Albumin: 1.3 g/dL — ABNORMAL LOW (ref 3.5–5.0)
Albumin: 1.3 g/dL — ABNORMAL LOW (ref 3.5–5.0)
Anion gap: 20 — ABNORMAL HIGH (ref 5–15)
Anion gap: 15 (ref 5–15)
BUN: 52 mg/dL — ABNORMAL HIGH (ref 6–20)
BUN: 45 mg/dL — ABNORMAL HIGH (ref 6–20)
CO2: 19 mmol/L — ABNORMAL LOW (ref 22–32)
CO2: 20 mmol/L — ABNORMAL LOW (ref 22–32)
Calcium: 6.8 mg/dL — ABNORMAL LOW (ref 8.9–10.3)
Calcium: 6.7 mg/dL — ABNORMAL LOW (ref 8.9–10.3)
Chloride: 98 mmol/L (ref 98–111)
Chloride: 100 mmol/L (ref 98–111)
Creatinine, Ser: 1.87 mg/dL — ABNORMAL HIGH (ref 0.61–1.24)
Creatinine, Ser: 1.61 mg/dL — ABNORMAL HIGH (ref 0.61–1.24)
GFR, Estimated: 42 mL/min — ABNORMAL LOW (ref >60)
GFR, Estimated: 51 mL/min — ABNORMAL LOW (ref >60)
Glucose, Bld: 275 mg/dL — ABNORMAL HIGH (ref 70–99)
Glucose, Bld: 344 mg/dL — ABNORMAL HIGH (ref 70–99)
Phosphorus: 8.1 mg/dL — ABNORMAL HIGH (ref 2.5–4.6)
Phosphorus: 4.8 mg/dL — ABNORMAL HIGH (ref 2.5–4.6)
Potassium: 4.2 mmol/L (ref 3.5–5.1)
Potassium: 3.9 mmol/L (ref 3.5–5.1)
Sodium: 137 mmol/L (ref 135–145)
Sodium: 135 mmol/L (ref 135–145)

## 2020-04-29 LAB — CBC WITH DIFFERENTIAL/PLATELET
Abs Immature Granulocytes: 1.46 10*3/uL — ABNORMAL HIGH (ref 0.00–0.07)
Basophils Absolute: 0.1 10*3/uL (ref 0.0–0.1)
Basophils Relative: 0 %
Eosinophils Absolute: 0 10*3/uL (ref 0.0–0.5)
Eosinophils Relative: 0 %
HCT: 21.3 % — ABNORMAL LOW (ref 39.0–52.0)
Hemoglobin: 7.3 g/dL — ABNORMAL LOW (ref 13.0–17.0)
Immature Granulocytes: 6 %
Lymphocytes Relative: 6 %
Lymphs Abs: 1.4 10*3/uL (ref 0.7–4.0)
MCH: 33.3 pg (ref 26.0–34.0)
MCHC: 34.3 g/dL (ref 30.0–36.0)
MCV: 97.3 fL (ref 80.0–100.0)
Monocytes Absolute: 0.8 10*3/uL (ref 0.1–1.0)
Monocytes Relative: 3 %
Neutro Abs: 21 10*3/uL — ABNORMAL HIGH (ref 1.7–7.7)
Neutrophils Relative %: 85 %
Platelets: 78 10*3/uL — ABNORMAL LOW (ref 150–400)
RBC: 2.19 MIL/uL — ABNORMAL LOW (ref 4.22–5.81)
RDW: 16.8 % — ABNORMAL HIGH (ref 11.5–15.5)
WBC: 24.7 10*3/uL — ABNORMAL HIGH (ref 4.0–10.5)
nRBC: 1.7 % — ABNORMAL HIGH (ref 0.0–0.2)

## 2020-04-29 LAB — GLUCOSE, CAPILLARY
Glucose-Capillary: 261 mg/dL — ABNORMAL HIGH (ref 70–99)
Glucose-Capillary: 151 mg/dL — ABNORMAL HIGH (ref 70–99)
Glucose-Capillary: 130 mg/dL — ABNORMAL HIGH (ref 70–99)
Glucose-Capillary: 175 mg/dL — ABNORMAL HIGH (ref 70–99)
Glucose-Capillary: 215 mg/dL — ABNORMAL HIGH (ref 70–99)

## 2020-04-29 LAB — PREPARE RBC (CROSSMATCH)

## 2020-04-29 LAB — ABO/RH: ABO/RH(D): A POS

## 2020-04-29 LAB — MAGNESIUM: Magnesium: 2.6 mg/dL — ABNORMAL HIGH (ref 1.7–2.4)

## 2020-04-29 MED ORDER — PRISMASOL BGK 4/2.5 32-4-2.5 MEQ/L EC SOLN
Status: DC
  Filled 2020-04-29 (×18): qty 5000

## 2020-04-29 MED ORDER — CALCIUM GLUCONATE-NACL 1-0.675 GM/50ML-% IV SOLN
1.0000 g | Freq: Once | INTRAVENOUS | Status: AC
  Administered 2020-04-29: 1000 mg via INTRAVENOUS
  Filled 2020-04-29: qty 50

## 2020-04-29 MED ORDER — PHENYLEPHRINE CONCENTRATED 100MG/250ML (0.4 MG/ML) INFUSION SIMPLE
0.0000 ug/min | INTRAVENOUS | Status: DC
  Administered 2020-04-29: 60 ug/min via INTRAVENOUS
  Administered 2020-04-29: 360 ug/min via INTRAVENOUS
  Administered 2020-04-30: 370 ug/min via INTRAVENOUS
  Filled 2020-04-29 (×5): qty 250

## 2020-04-29 MED ORDER — SODIUM CHLORIDE 0.9% IV SOLUTION: Freq: Once | INTRAVENOUS | Status: DC

## 2020-04-29 NOTE — Progress Notes (Signed)
NAME:  Zachary Woods, MRN:  [id number], DOB:  May 08, 1965, LOS: 9 ADMISSION DATE:  04/13/2020, CHIEF COMPLAINT: AMS in the setting of acute stroke   Brief History:  55 year old male who was admitted on 2/18 with left MCA infarction and small frontal SAH. Required intubation for progressive AMS.  History of Present Illness:  Zachary Woods is a 55 year old male with pertinent PMH of HTN, CVA, PFO s/p closure in 2018, DM 2, and GERD who presented to the ED with right sided weakness. He was last in his normal state of health the day prior. Overnight, the patient got up to go to the bathroom and could not move his right side and fell to the ground.   In ED, CT head showed small volume SAH. CTA/CTP demonstrated moderate sized left MCA territory ischemic area.   Patient's AMS continued to progress and needed to be intubated. PCCM was subsequently consulted and patient was transferred/ admitted to Neuro CCU. Per note, neurology is worried about septic emboli and recommend a TEE looking for endocarditis and vegetative valvular lesions. Empiric Vanc/ Cefepime.   Past Medical History:  has Hypertension; GERD (gastroesophageal reflux disease); Malignant hypertension; Cerebral embolism with cerebral infarction; Stroke determined by clinical assessment (HCC); PFO (patent foramen ovale); Acute CVA (cerebrovascular accident) (HCC); Subarachnoid hemorrhage (HCC); Type 2 diabetes mellitus with hyperosmolar hyperglycemic state (HHS) (HCC); AKI (acute kidney injury) (HCC); SIRS (systemic inflammatory response syndrome) (HCC); Metabolic acidosis with increased anion gap and accumulation of organic acids; Hypomagnesemia; Hypokalemia; Thrombocytopenia (HCC); and Stroke (HCC) on their problem list.    Significant Hospital Events:  2/18 > admit, intubated  Consults:  Cardiology, Cardio-Thoracic and Neurology   Procedures:    Significant Diagnostic Tests:  CT / CTA / CTP head and neck 2/18 > small volume right  frontal SAH, left MCA occlusion, ischemic area of 37cc with core infarct 21cc, 60% prox left ICA stenosis. MRI brain 2/18 > mod to large acute left MCA infarct, 1.8cm acute cortical / subcortical left occipital lobe infarct, small volume right frontal lobe SAH. Echo 2/18 > EF 60 - 65%, no source of embolism detected. LE duplex 2/18 > neg. Carotid US 2/18 > 1 - 39% stenosis left ICA. EEG 2/18 > mod to diffuse encephalopathy without seizures or epileptiform discharges. Chest x-ray 2/19- poor inspiratory effort but otherwise normal TEE 2/22 >  Micro Data:  COVID 2/18 > neg. Flu 2/18 > neg. Blood cultures 2/18- MSSA positive. 2/19 negative.   Antimicrobials:  Naficillin Interim History / Subjective:  Still maintained on two vasopressor support. Hypothermic. Got 1 unit prbc with CRRT.   Objective   Blood pressure 103/75, pulse 99, temperature (!) 96.44 F (35.8 C), temperature source Esophageal, resp. rate 20, height 5\' 6"  (1.676 m), weight 99.2 kg, SpO2 100 %. CVP:  [7 mmHg-12 mmHg] 9 mmHg  Vent Mode: PRVC FiO2 (%):  [40 %-50 %] 40 % Set Rate:  [22 bmp] 22 bmp Vt Set:  [510 mL] 510 mL PEEP:  [8 cmH20] 8 cmH20 Plateau Pressure:  [16 cmH20-20 cmH20] 18 cmH20   Intake/Output Summary (Last 24 hours) at 04/29/2020 1125 Last data filed at 04/29/2020 1116 Gross per 24 hour  Intake 8089.8 ml  Output 5613 ml  Net 2476.8 ml   Filed Weights   04/27/20 1552 04/28/20 0400 04/29/20 0500  Weight: 96 kg 103.3 kg 99.2 kg    Examination: General: Sedated in NAD. EEG leads in place HENT: ETT to vent Lungs:  Clear to auscultation. No wheezes or crackles Cardiovascular: atrial fibrillation, tachycardic rate in the 90-120s Neuro: not responsive Skin: Positive for numerous janeway lesions on UE bilaterally and left LE.   Resolved Hospital Problem list     Assessment & Plan:  Critically ill due to Left MCA stroke with left MCA branch occlusion with small right frontal subarachnoid.  Multifactorial encephalopathy, metabolic from sepsis - Vimpat 161100 mg, Keppra at 500mg  IV Q12h, and Valproic acid 500mg  TID  - Continuous EEG monitoring  - on fentanyl   Infective endocarditis: MSSA bacteremia, Bacterial Meningitis- possible skin source, septic shock Not improving - still on high dose  norepinephrine, vasopressin - Continue Naficillin as per ID recs. Gentamicin held due to AKI.  - Continue Rifampin per ID for PVE   Atrial Fibrillation: - continue amiodarone gtt  Renal failure: (Cr: 4.42, BUN: 161) +20L from admit - Appreciate Consult Nephrology  - continue CRRT. Unable to take off volume due to shock.   Uncontrolled type 2 diabetes; labile BGs - Continue TF  - Novolog for TF coverage to 10 units Q4h  - holding lantus for now due to labile BGs  Multiple electrolyte abnormalities Anion gap metabolic acidosis Hypernatremia: Hyperkalemia Hypocalcemia - correcting with dialysis - continue bicarb gtt - Monitor BMP   Best practice (evaluated daily)  Pain/Anxiety/Delirium protocol (if indicated): None Neuro vitals: every 2 hours AED's: Keppra and Valproic Acid  VAP protocol (if indicated): Intubated, maintain VAP protocols DVT prophylaxis: Heparin 3 times daily Nutrition Status:  tube feeds GI prophylaxis: Not indicated Urinary catheter: foley Glucose control: goal 140-180, as above Mobility/therapy needs: Bedrest Antibiotic de-escalation: Naficillin Home medication reconciliation: On hold Daily labs: CBC BMP Code Status: DNR Family Communication: Updated wife at bedside about poor prognosis. She has made him a DNR. Expect he might expire despite all aggressive measures. Multiple family members visited 2/26 during the day to say their goodbyes.  Disposition: ICU  Labs   CBC: Recent Labs  Lab 04/25/20 0521 04/26/20 0159 04/26/20 1920 04/27/20 0415 04/28/20 0344 04/28/20 0500 04/28/20 0747 04/29/20 0415  WBC 7.5 9.6  --  12.8*  --  28.5*  --  24.7*   NEUTROABS  --  8.1*  --  10.8*  --  23.9*  --  21.0*  HGB 10.8* 10.4*   < > 9.3* 8.5* 9.2* 7.8* 7.3*  HCT 30.7* 29.9*   < > 26.0* 25.0* 27.2* 23.0* 21.3*  MCV 97.2 95.5  --  95.6  --  98.9  --  97.3  PLT 76* 84*  --  92*  --  124*  --  78*   < > = values in this interval not displayed.    Basic Metabolic Panel: Recent Labs  Lab 04/03/2020 0405 04/16/2020 1630 04/24/20 0556 04/25/20 0521 04/27/20 1700 04/28/20 0344 04/28/20 0500 04/28/20 0601 04/28/20 0747 04/28/20 1605 04/29/20 0415  NA 145  --  148*   < > 146*   < > 142 142 143 139 137  K 3.8  --  3.4*   < > 3.8   < > 5.2* 5.4* 4.8 4.6 4.2  CL 116*  --  111   < > 109  --  107 106  --  101 98  CO2 17*  --  22   < > 16*  --  15* 16*  --  18* 19*  GLUCOSE 289*  --  274*   < > 174*  --  35* 26*  --  120* 275*  BUN 66*  --  85*   < > 159*  --  102* 96*  --  71* 52*  CREATININE 1.62*  --  1.92*   < > 4.34*  --  3.03* 2.98*  --  2.27* 1.87*  CALCIUM 7.8*  --  7.5*   < > 6.6*  --  7.0* 7.1*  --  6.9* 6.8*  MG 2.6* 2.8* 2.8*  --   --   --  3.1*  --   --   --  2.6*  PHOS 2.2* 3.1 5.5*  --  9.4*  --  9.7* 9.9*  --  8.6* 8.1*   < > = values in this interval not displayed.   GFR: Estimated Creatinine Clearance: 49.8 mL/min (A) (by C-G formula based on SCr of 1.87 mg/dL (H)). Recent Labs  Lab 04/26/20 0159 04/27/20 0415 04/28/20 0500 04/29/20 0415  WBC 9.6 12.8* 28.5* 24.7*    Liver Function Tests: Recent Labs  Lab 04/26/20 0159 04/27/20 0415 04/27/20 1700 04/28/20 0500 04/28/20 0601 04/28/20 1605 04/29/20 0415  AST 53* 45*  --  69*  --   --   --   ALT <5 7  --  8  --   --   --   ALKPHOS 96 91  --  145*  --   --   --   BILITOT 6.8* 6.4*  --  7.6*  --   --   --   PROT 5.0* 5.1*  --  5.8*  --   --   --   ALBUMIN 1.3* 1.1* 1.1* 1.2* 1.5* 1.6* 1.3*   No results for input(s): LIPASE, AMYLASE in the last 168 hours. No results for input(s): AMMONIA in the last 168 hours.  ABG    Component Value Date/Time   PHART 7.169  (LL) 04/28/2020 0747   PCO2ART 57.1 (H) 04/28/2020 0747   PO2ART 146 (H) 04/28/2020 0747   HCO3 20.8 04/28/2020 0747   TCO2 22 04/28/2020 0747   ACIDBASEDEF 7.0 (H) 04/28/2020 0747   O2SAT 98.0 04/28/2020 0747     Coagulation Profile: Recent Labs  Lab 04/27/20 0415  INR 1.5*    Cardiac Enzymes: No results for input(s): CKTOTAL, CKMB, CKMBINDEX, TROPONINI in the last 168 hours.  HbA1C: Hgb A1c MFr Bld  Date/Time Value Ref Range Status  04/26/2020 12:17 PM 10.5 (H) 4.8 - 5.6 % Final    Comment:    (NOTE) Pre diabetes:          5.7%-6.4%  Diabetes:              >6.4%  Glycemic control for   <7.0% adults with diabetes   08/04/2019 05:25 PM 8.2 (H) 4.8 - 5.6 % Final    Comment:             Prediabetes: 5.7 - 6.4          Diabetes: >6.4          Glycemic control for adults with diabetes: <7.0     CBG: Recent Labs  Lab 04/28/20 1906 04/28/20 2350 04/29/20 0446 04/29/20 0725 04/29/20 1059  GLUCAP 151* 231* 261* 151* 130*    Critical care time:     The patient is critically ill due to septic shock, AKI, encephalopathy, respiratory failure.  Critical care was necessary to treat or prevent imminent or life-threatening deterioration.  Critical care was time spent personally by me on the following activities: development of treatment plan with patient and/or surrogate as well as  nursing, discussions with consultants, evaluation of patient's response to treatment, examination of patient, obtaining history from patient or surrogate, ordering and performing treatments and interventions, ordering and review of laboratory studies, ordering and review of radiographic studies, pulse oximetry, re-evaluation of patient's condition and participation in multidisciplinary rounds.   Critical Care Time devoted to patient care services described in this note is 36 minutes. This time reflects time of care of this signee Charlott Holler . This critical care time does not reflect separately  billable procedures or procedure time, teaching time or supervisory time of PA/NP/Med student/Med Resident etc but could involve care discussion time.       Mickel Baas Pulmonary [facility] Critical Care Medicine 04/29/2020 11:25 AM  Pager: see AMION After hours pager: [phone]  If no response to pager , please call [phone] until 7pm After 7:00 pm call Elink  [phone]

## 2020-04-29 NOTE — Consult Note (Addendum)
WOC Nurse Consult Note: Reason for Consult: skin tears and skin injuries Wound type: traumatic Pressure Injury POA: N/A Patient is critically ill with events over the weekend that may be insurmountable. Bedside RNs today indicate that skin tears are a lower priority and that if patient stabilizes, they are able to treat with our house protocols using xeroform or white petrolatum to cover areas and secure using a dry dressing topped and Kerlix roll gauze/paper tape.   WOC nursing team will not follow, but will remain available to this patient, the nursing and medical teams.  Please re-consult if needed. Thanks, Ladona Mow, MSN, RN, GNP, [place]  Pager# [phone]

## 2020-04-29 NOTE — Progress Notes (Signed)
[facility] Progress Note  Name: Zachary Woods MRN: [id number] DOB: [birth year]  Subjective:  Seen and examined on CRRT; procedure supervised.  Anuric over 2/26.  Had 4.1 liters UF with CRRT over 2/26.  On levo at 46 mcg/min and on vaso.  Spoke with his wife regarding risks/benefits/indication for blood products and she did consent for blood products.   Review of systems:  Unable to obtain 2/2 intubated and sedated  ------------------ Background on consult:  Zachary Woods is a 55 y.o. male with a history of diabetes, hypertension, CVA, and GERD who presented to the hospital with right sided weakness on 2/18.  He was found to have acute stroke - left MCA.  Course has been complicated by MSSA bacteremia and endocarditis.  Per report felt to have developed vegetation on closure device for a PFO placed a few years ago.  He has been hypotensive requiring levo and vasopressin.  Course has also been complicated by acute kidney injury with a BUN of up to 161 and Cr 4.42 today.  Nephrology is consulted for assistance with management of AKI.  Got CT angio on 2/18.  Home meds include ibuprofen, meloxicam and HCTZ.  Renal US with no hydro and normal echogenicity.  Noted incidental finding of contour nodularity of liver suggesting cirrhosis.  He had 725 mL Uop over 2/24.  He has been on levo at 25 mcg/min and vaso.  Spoke with his wife via phone and discussed risks/benefits/indications for renal replacement therapy and she consented for renal replacement therapy.     Intake/Output Summary (Last 24 hours) at 04/29/2020 0721 Last data filed at 04/29/2020 0700 Gross per 24 hour  Intake 7324.74 ml  Output 4828 ml  Net 2496.74 ml    Vitals:  Vitals:   04/29/20 0358 04/29/20 0400 04/29/20 0500 04/29/20 0600  BP: 99/66 108/62 (!) 85/63 96/77  Pulse: (!) 101 (!) 110    Resp: (!) 31 18 17  (!) 22  Temp: (!) 95.9 F (35.5 C) (!) 95.9 F (35.5 C) (!) 96.08 F (35.6 C) (!) 95.72 F (35.4 C)   TempSrc:  Esophageal    SpO2: 100% 99%    Weight:   99.2 kg   Height:         Physical Exam:  General:  Adult male critically ill. intubated  HEENT: NCAT  Neck: trachea midline; normal circumference Heart: S1S2 no rub; tachycardic to the low 100's Lungs: coarse mechanical breath sounds and crackles Abdomen: softly distended  Extremities: 1+ upper and lower extremity edema Skin: discoloration of multiple toes  Neuro: sedation running  GU scrotal edema; has foley  Medications reviewed   Labs:  BMP Latest Ref Rng & Units 04/29/2020 04/28/2020 04/28/2020  Glucose 70 - 99 mg/dL 04/17/2020) 706(C) -  BUN 6 - 20 mg/dL 376(E) 83(T) -  Creatinine 0.61 - 1.24 mg/dL 51(V) 6.16(W) -  BUN/Creat Ratio 9 - 20 - - -  Sodium 135 - 145 mmol/L 137 139 143  Potassium 3.5 - 5.1 mmol/L 4.2 4.6 4.8  Chloride 98 - 111 mmol/L 98 101 -  CO2 22 - 32 mmol/L 19(L) 18(L) -  Calcium 8.9 - 10.3 mg/dL 7.37(T) 6.9(L) -    Assessment/Plan:   # AKI  - Secondary to ischemic and pre-renal ATN with acute stroke, shock, contrast.  May have also had septic emboli to kidneys.  Baseline Cr around 1. Renal 0.6(Y no hydro, normal echogenicity.  UA with UTI and 30 mg/dl and [place] RBC.  Note  up/cr ratio 1970 mg/g - CRRT.  dialysate at 2.2 liters/hr and change dialysate back to 4K.  keep even as tolerated  - note foley   # Septic shock  - with bacteremia and endocarditis   - abx and pressors per primary team   # Hypoglycemia - can continue bicarb gtt in D5W as needed - defer to critical care as hx hypoglycemia  # MSSA bacteremia - with endocarditis - abx per primary team   # Acute CVA  - per neuro    # Metabolic acidosis  - septic shock and AKI  - Continue RRT  - needs dextrose and decompensated - ok to continue bicarb in D5W as above  # Hypernatremia - free water deficit - improved.  D/c extra enteric free water for now  # Hypocalcemia  - resolves with correction for albumin  - calcium 1 gram IV  once  # Anemia normocytic - no ESA with recent CVA; iron deficient - defer IV iron in setting of infection - PRBC's today - his wife provided consent  # Afib with RVR - on amio gtt  # Seizure activity - EEG monitoring per neuro - per neuro  - azotemia may be contributing  Estanislado Emms, MD 04/29/2020  7:42 AM

## 2020-04-29 NOTE — Progress Notes (Addendum)
STROKE TEAM PROGRESS NOTE   INTERVAL HISTORY Patient transitioned to DNR status yesterday after CCM discussion with wife regarding prognosis.   Nephrology planning PRBC transfusion today.  No visitors at bedside Unable to obtain ROS due to intubated, sedated and non-responsive state.  Vitals:   04/29/20 0700 04/29/20 0720 04/29/20 0800 04/29/20 0900  BP: 106/75  99/79 97/77  Pulse: (!) 106 (!) 107 (!) 111   Resp: (!) 22 20 (!) 22 (!) 22  Temp: (!) 95.72 F (35.4 C) (!) 95.72 F (35.4 C) (!) 95.72 F (35.4 C) (!) 95.54 F (35.3 C)  TempSrc:      SpO2: 100% 93% 100%   Weight:      Height:       CBC:  Recent Labs  Lab 04/28/20 0500 04/28/20 0747 04/29/20 0415  WBC 28.5*  --  24.7*  NEUTROABS 23.9*  --  21.0*  HGB 9.2* 7.8* 7.3*  HCT 27.2* 23.0* 21.3*  MCV 98.9  --  97.3  PLT 124*  --  78*   Basic Metabolic Panel:  Recent Labs  Lab 04/28/20 0500 04/28/20 0601 04/28/20 1605 04/29/20 0415  NA 142   < > 139 137  K 5.2*   < > 4.6 4.2  CL 107   < > 101 98  CO2 15*   < > 18* 19*  GLUCOSE 35*   < > 120* 275*  BUN 102*   < > 71* 52*  CREATININE 3.03*   < > 2.27* 1.87*  CALCIUM 7.0*   < > 6.9* 6.8*  MG 3.1*  --   --  2.6*  PHOS 9.7*   < > 8.6* 8.1*   < > = values in this interval not displayed.   Lipid Panel:  Recent Labs  Lab 04/24/20 0556  TRIG 187*   HgbA1c:  No results for input(s): HGBA1C in the last 168 hours. Urine Drug Screen:  No results for input(s): LABOPIA, COCAINSCRNUR, LABBENZ, AMPHETMU, THCU, LABBARB in the last 168 hours.  Alcohol Level No results for input(s): ETH in the last 168 hours.  IMAGING past 24 hours No results found. PHYSICAL EXAM  Temp:  [95.54 F (35.3 C)-97.7 F (36.5 C)] 95.54 F (35.3 C) (02/27 0900) Pulse Rate:  [101-127] 111 (02/27 0800) Resp:  [16-31] 22 (02/27 0900) BP: (85-131)/(52-101) 97/77 (02/27 0900) SpO2:  [93 %-100 %] 100 % (02/27 0800) Arterial Line BP: (91-166)/(47-89) 95/60 (02/27 0600) FiO2 (%):  [40  %-50 %] 40 % (02/27 0720) Weight:  [99.2 kg] 99.2 kg (02/27 0500)  General - Well nourished, well developed, intubated on sedation.  Ophthalmologic - fundi not visualized due to noncooperation.  Cardiovascular - regular rhythm but tachycardia, not in afib.  Neuro - intubated on sedation, eyes closed, not following commands. With forced eye opening, eyes in mid position, not blinking to visual threat, doll's eyes absent, not tracking, pupil b/l 25mm, sluggish. Corneal reflex may be weakly presenton the left but not on the right, gag and cough present. Breathing over the vent.  Facial symmetry not able to test due to ET tube.  Tongue protrusion not cooperative. Unresponsive to painful stimuli x all 4 extremities. DTR diminished and no babinski. Sensation, coordination and gait not tested.   ASSESSMENT/PLAN [name] is a 55 y.o. male with history of previous cryptogenic Left MCA stroke s/p PFO closure, lost to stroke follow up 2018, DM2, HTN, GERD presenting with aphasia, right facial droop and right sided weakness noted upon waking  up at 0300 this morning. He slid to the ground from the bed after awakening. EMS was called and he was brought to ED in code stroke status. NIHSS of 23 was noted upon arrival.   Left MCA stroke d/t left MCA branch occlusion with small right frontal SAH - likely due to endocarditis with small mycotic aneurysm  Code Stroke HCT: small single sulcal SAH  CTA- occlusion of a trifurcation branch of the left MCA.  No aneurysm  CTP - Ischemic area 37cc, ischemic core 63ml by CBF<30%, CBV - 60ml  MRI - Moderate to large acute left MCA territory cortical/subcortical infarct affecting the left frontal and parietal lobes, as well as left insula and subinsular region. 1.8 cm acute cortical/subcortical left occipital lobe infarct, a few additional punctate acute infarcts are questioned within the right frontoparietal white matter (versus artifact). Redemonstrated small  volume subarachnoid hemorrhage overlying the anterior right frontal lobe.  CT head x 3 - unchanged with persistent encephalopathy. It is reasonable to update HCT to contribute to prognosis discussion.   2D Echo EF 60-65%. No shunt. Septal Repair:25 mm Amplazter on 08/01/2016 noted.    TEE Large 1x2 cm vegetation associated with the Amplatzer atrial septal occlusion device  LDL, dierct 15.3  HgbA1c 10.5  Beta-2-glycoprotein repeat 94   DVT PPX: SCDS  On ASA 81mg  PTA, now no antiplatelets due to thrombocytopenia and endocarditis with SAH concerning for mycotic aneurysm  Therapy recommendations:  TBD  Disposition:  TBD  Per Dr. discussion with wife undertaken, patient now DNR. Prognosis is grim per discussion with Dr. Celine Mans.   Seizure  EEG 2/20 potential epileptogenicity arising from left temporal region as well as moderate diffuse encephalopathy  LTM 2/21 multiple seizures without clinical signs arising from left temporoparietal region   LTM 2/22 two seizures without clinical signs arising from left temporoparietal region, improving from previous day  LTM 2/23 epileptogenicity arising from left temporoparietal region. Additionally there ismoderate diffuse encephalopathy, nonspecific etiology.  LTM 2/24 multiple seizures without clinical signs arising from left temporoparietal region, one seizure every few hours, lasting 1 to 2 minutes each. Moderate diffuse encephalopathy   LTM 2/25 brief ictal-interictal rhythmic discharges arising from left temporal parietal region which is on the ictal-interictal continuum with high potential for seizures.Additionally there ismoderate diffuse encephalopathy  LTM discontinued 2/26  On Keppra 1000 bid; valproic acid 500 tid and vimpat 100 bid  Antiphospholipid syndrome  07/2016 beta-2 glycoprotein IgG = 57 in the setting of stroke  11/2016 = 90 - 3 months repeat  04/2020 = 94 in the setting of stroke  Will need long term AC  once appropriate  afib RVR, newly developed  Developed overnight 2/22  On amiodarone IV gtt  Cardizem IV x 1 ; Metoprolol prn  May need long term AC once appropriate  Hx of stroke PFO closure  07/2016 patient had left MCA small infarct.  MRA head and neck negative.  DVT, TTE, pan CT negative.  TCD bubble study showed a small PFO.  TEE positive for PFO.  LDL 68 and A1c 5.6.  THC positive.  Hypercoagulable work-up showed beta-2 glycoprotein IgG 57.  Discharged with aspirin.  PFO closure done in 08/2016.  Repeat TTE showed good PFO closure procedure.  He was put on DAPT and Lipitor after PFO closure.  Repeat beta-2 glycoprotein IgG in 11/2016 was 90.  However, patient was lost follow-up with neurology.  Antiphospholipid syndrome was not diagnosed and was not treated.  MSSA bacteremia Septic shock  Bacterial meningitis Endocarditis UTI Fever  Tmax 101.3->103->103.5->102.3->103.2, hypothermic to 95 on 2/26-2/27.  Leukocytosis 10.8->9.8->7.5->9.6->12.8  Lactic acid 7.5->5.8->4.5->2.9 (final)  Antibiotics narrowed to nafcillin and rifampin  B Cx MSSA, serial Blood cultures NGTD  TEE Large 1x2 cm vegetation associated with the Amplatzer atrial septal occlusion device  Urine Cx - MSSA  CSF WBC 90 (Neutro 71%, lymph 29%), RBC 57, protein 117, glucose 104 (blood glucose 300s), Cx NGTD  ID on board  C.diff checked and negative 2/25  Management per CCM  Respiratory failure  Tachypnea with respiratory distress  Intubated 2/20  Not candidate for extubation  CCM on board   BP management . HTN meds at home: Norvasc 10mg  daily, hctz 12.5mg  daily, not ordered. . Hypotension: On levophed and vasopressin per CCM . On MIVF, FW 100 Q4 . Keep BP at least greater than 100 systolic.  Long-term BP goal normotensive  Hyperlipidemia  Home meds: None. Per chart review he stopped statin 2018 d/t myalgia  TG 327   LDL direct 15.3, goal < 70  No statin indicated now given low  LDL  Diabetes type II UnControlled  Home meds:  Glucophage 500mg  daily   HgbA1c 10.5, goal < 7.0  Hyperosmolar hyperglycemic on admission  Was on insulin drip -> lantus 30U->40U bid  Persistent hyperglycemia  CBGs  SSI  Close PCP follow up after discharge  Thrombocytopenia  Plt 74->62->45->47->63->76->84->92  Likely reactive to current critical illness  Hepatitis panel non-reactive  ASA discontinued  HOLD antiplatelet meds   on heparin subq Q8   AKI, Renal failure  AKI Cre 1.55->1.23->1.62->1.92->2.93->3.45->4.45->2.27  On FW  CCM on board  Nephrology consulted.   2019 abdomen no hydronephrosis  On CRRT   Other Stroke Risk Factors   Former Cigarette smoker  Other Active Problems  Metabolic Acidosis: on CRRT  Hyponatremia->hypernatremia, Na 136->140->145->148->149->151->143->142 - on FW  Case discussed with Dr. . Management and plan are per his direction.  Delila A Bailey-Modzik, NP-C  Patient seen, examined, labs,vitals and notes reviewed. Discussed plan with [place], NP and agree with assessment and plan as documented above. I have independently reviewed the chart, obtained history, review of systems and examined the patient.  Electronically signed by:  Thomasena Edis, MD Page: Janey Genta 05/01/2020, 3:54 AM  To contact Stroke Continuity provider, please refer to [phone]. After hours, contact General Neurology

## 2020-04-29 NOTE — Progress Notes (Addendum)
[facility]  Date of Admission:  04/19/2020           Reason for visit: Follow up on MSSA bacteremia  Antibiotics : Nafcillin 2/22 -  Rifampin 2/22 - Gentamicin 2/22 -2/23   Principal Problem:   MSSA bacteremia Active Problems:   PFO (patent foramen ovale)   Acute CVA (cerebrovascular accident) (HCC)   Subarachnoid hemorrhage (HCC)   Type 2 diabetes mellitus with hyperosmolar hyperglycemic state (HHS) (HCC)   AKI (acute kidney injury) (HCC)   SIRS (systemic inflammatory response syndrome) (HCC)   Metabolic acidosis with increased anion gap and accumulation of organic acids   Hypomagnesemia   Hypokalemia   Thrombocytopenia (HCC)   CVA (cerebral vascular accident) (HCC)   Suspected endocarditis   Hyperglycemia   Seizure (HCC)   ASSESSMENT:    # Septic Shock  #MSSA bacteremia/PVEndocarditis ( Prosthetic Device)  In the setting ofLarge 1x2 cm vegetation associated with the Amplatzer atrial septal occlusion device, which appears to extend to the posterior mitral leaflet.  # Acute bacterial meningitis # Acute Respiratory failure #AcuteLeft MCAIschemic Infarct( left frontal, parietal and occipital lobes) #Rt frontal SAH ? Concerns for septic emboli and mycotic aneurysm #Thrombocytopenia- in the setting of sepsis/endocarditis  #Worsening AKI--now on CRRT # Seizures # Liver Cirrhosis - LFTs appear stable  PLAN:    -Continue Nafcillin and Rifampin dosed per pharmacy for CRRT  -Further GOC discussion pending.  If family decides to continue with full management will consider adding back gentamicin now that he is on CRRT to treat PV endocarditis as can be renally dosed -F/u blood cultures 2/24 NGTD -Vent management and pressors per CCM -Prognosis overall not good  Dr Daiva Eves to assume care tmrw.  SUBJECTIVE:   24 hour events:  Acidotic Labile blood  sugars Requiring 2 pressors Still not responsive  Intubated, unable to obtain.  Discussed with RN   OBJECTIVE:   Blood pressure 97/77, pulse (!) 111, temperature (!) 95.54 F (35.3 C), resp. rate (!) 22, height 5\' 6"  (1.676 m), weight 99.2 kg, SpO2 100 %. Body mass index is 35.3 kg/m.  Physical Exam General - Intubated, non responsive, on CRRT Chest - symmetric chest rise and fall Neck - left CVC IJ, right IJ HD line Abdomen - soft, non distended Extremities - edematous, anasarca   Lab Results: Lab Results  Component Value Date   WBC 24.7 (H) 04/29/2020   HGB 7.3 (L) 04/29/2020   HCT 21.3 (L) 04/29/2020   MCV 97.3 04/29/2020   PLT 78 (L) 04/29/2020    Lab Results  Component Value Date   NA 137 04/29/2020   K 4.2 04/29/2020   CO2 19 (L) 04/29/2020   GLUCOSE 275 (H) 04/29/2020   BUN 52 (H) 04/29/2020   CREATININE 1.87 (H) 04/29/2020   CALCIUM 6.8 (L) 04/29/2020   GFRNONAA 42 (L) 04/29/2020   GFRAA 91 08/04/2019    Lab Results  Component Value Date   ALT 8 04/28/2020   AST 69 (H) 04/28/2020   ALKPHOS 145 (H) 04/28/2020   BILITOT 7.6 (H) 04/28/2020    No results found for: CRP  No results found for: ESRSEDRATE   I have reviewed the micro and lab results in Epic.  Imaging: DG CHEST PORT 1 VIEW  Result Date: 04/27/2020 CLINICAL DATA:  Central line placement EXAM: PORTABLE CHEST 1 VIEW COMPARISON:  Chest radiograph from one day prior. FINDINGS: Endotracheal tube tip is 3.1 cm above the carina.  Enteric tube enters stomach with the tip not seen on this image. Right internal jugular central venous catheter terminates in the middle third of the SVC. Left internal jugular central venous catheter terminates at the cavoatrial junction. Stable cardiomediastinal silhouette with normal heart size. No pneumothorax. No pleural effusion. No pulmonary edema. Low lung volumes. Mild right basilar atelectasis. IMPRESSION: 1. Well-positioned support structures.  No pneumothorax. 2.  Low lung volumes with mild right basilar atelectasis. Electronically Signed   By: Delbert Phenix M.D.   On: 04/27/2020 15:22     Imaging  independently reviewed in Epic.    Vedia Coffer for Infectious Disease [facility] [phone] pager 04/29/2020, 10:24 AM

## 2020-04-30 DIAGNOSIS — I480 Paroxysmal atrial fibrillation: Secondary | ICD-10-CM

## 2020-04-30 DIAGNOSIS — R7989 Other specified abnormal findings of blood chemistry: Secondary | ICD-10-CM

## 2020-04-30 DIAGNOSIS — N179 Acute kidney failure, unspecified: Secondary | ICD-10-CM | POA: Diagnosis not present

## 2020-04-30 DIAGNOSIS — I4891 Unspecified atrial fibrillation: Secondary | ICD-10-CM

## 2020-04-30 DIAGNOSIS — I63412 Cerebral infarction due to embolism of left middle cerebral artery: Secondary | ICD-10-CM | POA: Diagnosis not present

## 2020-04-30 DIAGNOSIS — N390 Urinary tract infection, site not specified: Secondary | ICD-10-CM

## 2020-04-30 DIAGNOSIS — I6310 Cerebral infarction due to embolism of unspecified precerebral artery: Secondary | ICD-10-CM | POA: Diagnosis not present

## 2020-04-30 DIAGNOSIS — I639 Cerebral infarction, unspecified: Secondary | ICD-10-CM | POA: Diagnosis not present

## 2020-04-30 DIAGNOSIS — R0989 Other specified symptoms and signs involving the circulatory and respiratory systems: Secondary | ICD-10-CM

## 2020-04-30 DIAGNOSIS — R739 Hyperglycemia, unspecified: Secondary | ICD-10-CM | POA: Diagnosis not present

## 2020-04-30 DIAGNOSIS — I10 Essential (primary) hypertension: Secondary | ICD-10-CM

## 2020-04-30 DIAGNOSIS — Z978 Presence of other specified devices: Secondary | ICD-10-CM

## 2020-04-30 DIAGNOSIS — R7881 Bacteremia: Secondary | ICD-10-CM | POA: Diagnosis not present

## 2020-04-30 DIAGNOSIS — D6861 Antiphospholipid syndrome: Secondary | ICD-10-CM

## 2020-04-30 DIAGNOSIS — Z8673 Personal history of transient ischemic attack (TIA), and cerebral infarction without residual deficits: Secondary | ICD-10-CM

## 2020-04-30 LAB — CBC WITH DIFFERENTIAL/PLATELET
Abs Immature Granulocytes: 1.25 10*3/uL — ABNORMAL HIGH (ref 0.00–0.07)
Basophils Absolute: 0.1 10*3/uL (ref 0.0–0.1)
Basophils Relative: 0 %
Eosinophils Absolute: 0 10*3/uL (ref 0.0–0.5)
Eosinophils Relative: 0 %
HCT: 23.4 % — ABNORMAL LOW (ref 39.0–52.0)
Hemoglobin: 8.2 g/dL — ABNORMAL LOW (ref 13.0–17.0)
Immature Granulocytes: 5 %
Lymphocytes Relative: 7 %
Lymphs Abs: 1.7 10*3/uL (ref 0.7–4.0)
MCH: 31.8 pg (ref 26.0–34.0)
MCHC: 35 g/dL (ref 30.0–36.0)
MCV: 90.7 fL (ref 80.0–100.0)
Monocytes Absolute: 1 10*3/uL (ref 0.1–1.0)
Monocytes Relative: 4 %
Neutro Abs: 20.7 10*3/uL — ABNORMAL HIGH (ref 1.7–7.7)
Neutrophils Relative %: 84 %
Platelets: 67 10*3/uL — ABNORMAL LOW (ref 150–400)
RBC: 2.58 MIL/uL — ABNORMAL LOW (ref 4.22–5.81)
RDW: 17.1 % — ABNORMAL HIGH (ref 11.5–15.5)
WBC: 24.7 10*3/uL — ABNORMAL HIGH (ref 4.0–10.5)
nRBC: 2.7 % — ABNORMAL HIGH (ref 0.0–0.2)

## 2020-04-30 LAB — HEPATIC FUNCTION PANEL
ALT: 40 U/L (ref 0–44)
AST: 333 U/L — ABNORMAL HIGH (ref 15–41)
Albumin: 1.2 g/dL — ABNORMAL LOW (ref 3.5–5.0)
Alkaline Phosphatase: 140 U/L — ABNORMAL HIGH (ref 38–126)
Bilirubin, Direct: 7.5 mg/dL — ABNORMAL HIGH (ref 0.0–0.2)
Indirect Bilirubin: 4.2 mg/dL — ABNORMAL HIGH (ref 0.3–0.9)
Total Bilirubin: 11.7 mg/dL — ABNORMAL HIGH (ref 0.3–1.2)
Total Protein: 4.9 g/dL — ABNORMAL LOW (ref 6.5–8.1)

## 2020-04-30 LAB — TYPE AND SCREEN
ABO/RH(D): A POS
Antibody Screen: NEGATIVE
Unit division: 0

## 2020-04-30 LAB — RENAL FUNCTION PANEL
Albumin: 1.1 g/dL — ABNORMAL LOW (ref 3.5–5.0)
Anion gap: 15 (ref 5–15)
BUN: 39 mg/dL — ABNORMAL HIGH (ref 6–20)
CO2: 21 mmol/L — ABNORMAL LOW (ref 22–32)
Calcium: 6.8 mg/dL — ABNORMAL LOW (ref 8.9–10.3)
Chloride: 100 mmol/L (ref 98–111)
Creatinine, Ser: 1.59 mg/dL — ABNORMAL HIGH (ref 0.61–1.24)
GFR, Estimated: 51 mL/min — ABNORMAL LOW (ref >60)
Glucose, Bld: 283 mg/dL — ABNORMAL HIGH (ref 70–99)
Phosphorus: 4.9 mg/dL — ABNORMAL HIGH (ref 2.5–4.6)
Potassium: 3.9 mmol/L (ref 3.5–5.1)
Sodium: 136 mmol/L (ref 135–145)

## 2020-04-30 LAB — GLUCOSE, CAPILLARY
Glucose-Capillary: 152 mg/dL — ABNORMAL HIGH (ref 70–99)
Glucose-Capillary: 125 mg/dL — ABNORMAL HIGH (ref 70–99)

## 2020-04-30 LAB — BPAM RBC
Blood Product Expiration Date: 202203192359
ISSUE DATE / TIME: 202202271107
Unit Type and Rh: 6200

## 2020-04-30 LAB — MAGNESIUM: Magnesium: 2.5 mg/dL — ABNORMAL HIGH (ref 1.7–2.4)

## 2020-04-30 MED ORDER — DEXTROSE 5 % IV SOLN: INTRAVENOUS | Status: DC

## 2020-04-30 MED ORDER — GLYCOPYRROLATE 0.2 MG/ML IJ SOLN
0.2000 mg | INTRAMUSCULAR | Status: DC | PRN
  Filled 2020-04-30: qty 1

## 2020-04-30 MED ORDER — FENTANYL 2500MCG IN NS 250ML (10MCG/ML) PREMIX INFUSION: 0.0000 ug/h | INTRAVENOUS | Status: DC

## 2020-04-30 MED ORDER — FENTANYL CITRATE (PF) 100 MCG/2ML IJ SOLN
50.0000 ug | INTRAMUSCULAR | Status: DC | PRN
Start: 2020-04-30 — End: 2020-04-30

## 2020-04-30 MED ORDER — DIPHENHYDRAMINE HCL 50 MG/ML IJ SOLN: 25.0000 mg | INTRAMUSCULAR | Status: DC | PRN

## 2020-04-30 MED ORDER — MIDAZOLAM BOLUS VIA INFUSION (WITHDRAWAL LIFE SUSTAINING TX)
2.0000 mg | INTRAVENOUS | Status: DC | PRN
  Administered 2020-04-30 (×3): 2 mg via INTRAVENOUS
  Filled 2020-04-30: qty 2

## 2020-04-30 MED ORDER — FENTANYL BOLUS VIA INFUSION
100.0000 ug | INTRAVENOUS | Status: DC | PRN
  Administered 2020-04-30 (×2): 100 ug via INTRAVENOUS
  Filled 2020-04-30: qty 100

## 2020-04-30 MED ORDER — MIDAZOLAM 50MG/50ML (1MG/ML) PREMIX INFUSION
0.0000 mg/h | INTRAVENOUS | Status: DC
  Administered 2020-04-30: 1 mg/h via INTRAVENOUS
  Filled 2020-04-30 (×2): qty 50

## 2020-04-30 MED ORDER — GLYCOPYRROLATE 1 MG PO TABS
1.0000 mg | ORAL_TABLET | ORAL | Status: DC | PRN
  Filled 2020-04-30: qty 1

## 2020-04-30 MED ORDER — MIDAZOLAM HCL 2 MG/2ML IJ SOLN
1.0000 mg | INTRAMUSCULAR | Status: DC | PRN
  Administered 2020-04-30: 1 mg via INTRAVENOUS
  Filled 2020-04-30: qty 2

## 2020-04-30 MED ORDER — GLYCOPYRROLATE 0.2 MG/ML IJ SOLN
0.2000 mg | INTRAMUSCULAR | Status: DC | PRN
  Administered 2020-04-30: 0.2 mg via SUBCUTANEOUS

## 2020-04-30 MED ORDER — POLYVINYL ALCOHOL 1.4 % OP SOLN
1.0000 [drp] | Freq: Four times a day (QID) | OPHTHALMIC | Status: DC | PRN
  Filled 2020-04-30: qty 15

## 2020-05-01 ENCOUNTER — Telehealth: Payer: Self-pay | Admitting: Family Medicine

## 2020-05-01 LAB — CULTURE, BLOOD (ROUTINE X 2)
Culture: NO GROWTH
Culture: NO GROWTH
Special Requests: ADEQUATE
Special Requests: ADEQUATE

## 2020-05-01 NOTE — Progress Notes (Signed)
RT compassionately extubated patient to comfort per family wishes and MD order with RN and family at bedside.

## 2020-05-01 NOTE — Progress Notes (Signed)
NAME:  Zachary Woods, MRN:  [id number], DOB:  03/19/1965, LOS: 10 ADMISSION DATE:  06/19/2020, CHIEF COMPLAINT: AMS in the setting of acute stroke   Brief History:  55 year old male who was admitted on 2/18 with left MCA infarction and small frontal SAH. Required intubation for progressive AMS.  History of Present Illness:  Zachary Woods is a 55 year old male with pertinent PMH of HTN, CVA, PFO s/p closure in 2018, DM 2, and GERD who presented to the ED with right sided weakness. He was last in his normal state of health the day prior. Overnight, the patient got up to go to the bathroom and could not move his right side and fell to the ground.   In ED, CT head showed small volume SAH. CTA/CTP demonstrated moderate sized left MCA territory ischemic area.   Patient's AMS continued to progress and needed to be intubated. PCCM was subsequently consulted and patient was transferred/ admitted to Neuro CCU. Per note, neurology is worried about septic emboli and recommend a TEE looking for endocarditis and vegetative valvular lesions. Empiric Vanc/ Cefepime.   Past Medical History:  has Hypertension; GERD (gastroesophageal reflux disease); Malignant hypertension; Cerebral embolism with cerebral infarction; Stroke determined by clinical assessment (HCC); PFO (patent foramen ovale); Acute CVA (cerebrovascular accident) (HCC); Subarachnoid hemorrhage (HCC); Type 2 diabetes mellitus with hyperosmolar hyperglycemic state (HHS) (HCC); AKI (acute kidney injury) (HCC); SIRS (systemic inflammatory response syndrome) (HCC); Metabolic acidosis with increased anion gap and accumulation of organic acids; Hypomagnesemia; Hypokalemia; Thrombocytopenia (HCC); and Stroke (HCC) on their problem list.    Significant Hospital Events:  2/18 > admit, intubated  Consults:  Cardiology, Cardio-Thoracic and Neurology   Procedures:    Significant Diagnostic Tests:  CT / CTA / CTP head and neck 2/18 > small volume right  frontal SAH, left MCA occlusion, ischemic area of 37cc with core infarct 21cc, 60% prox left ICA stenosis. MRI brain 2/18 > mod to large acute left MCA infarct, 1.8cm acute cortical / subcortical left occipital lobe infarct, small volume right frontal lobe SAH. Echo 2/18 > EF 60 - 65%, no source of embolism detected. LE duplex 2/18 > neg. Carotid US 2/18 > 1 - 39% stenosis left ICA. EEG 2/18 > mod to diffuse encephalopathy without seizures or epileptiform discharges. Chest x-ray 2/19- poor inspiratory effort but otherwise normal TEE 2/22 >  Micro Data:  COVID 2/18 > neg. Flu 2/18 > neg. Blood cultures 2/18- MSSA positive. 2/19 negative.   Antimicrobials:  Naficillin Interim History / Subjective:  Still maintained on two vasopressor support. Hypothermic. Got 1 unit prbc with CRRT.   Objective   Blood pressure 140/67, pulse 88, temperature 99.5 F (37.5 C), resp. rate (!) 22, height 5\' 6"  (1.676 m), weight 95.4 kg, SpO2 99 %. CVP:  [8 mmHg-24 mmHg] 12 mmHg  Vent Mode: PRVC FiO2 (%):  [40 %] 40 % Set Rate:  [22 bmp] 22 bmp Vt Set:  [510 mL] 510 mL PEEP:  [8 cmH20] 8 cmH20 Plateau Pressure:  [18 cmH20] 18 cmH20   Intake/Output Summary (Last 24 hours) at 04/15/2020 1233 Last data filed at 04/24/2020 1100 Gross per 24 hour  Intake 7547.6 ml  Output 6886 ml  Net 661.6 ml   Filed Weights   04/28/20 0400 04/29/20 0500 04/25/2020 0500  Weight: 103.3 kg 99.2 kg 95.4 kg    Examination: General: Sedated in NAD.  HENT: ETT and OGT in place Lungs: Clear to auscultation. No wheezes or  crackles Cardiovascular: HS normal now in SR Neuro: minimal spontaneous eye opening. No response to pain.  Skin: Positive for numerous janeway lesions on UE bilaterally and left LE.   Resolved Hospital Problem list     Assessment & Plan:  Critically ill due to Left MCA stroke with left MCA branch occlusion with small right frontal subarachnoid.  Multifactorial encephalopathy, metabolic from  sepsis Focal seizures  Critically ill due to septic shock requiring titration of vasopressors.  Infective endocarditis: MSSA bacteremia, Bacterial Meningitis- possible skin source,  Atrial Fibrillation Critically ill due to acute Renal failure: (Cr: 4.42, BUN: 161) +20L from admit requiring CRRT.  Uncontrolled type 2 diabetes; labile BGs Anion gap metabolic acidosis Hypernatremia: Hyperkalemia Hypocalcemia  Plan:  - Follow up conversation with family again. Indicated to them that we are in a no-win situation as patient has large vegetation which will be nearly impossible to sterilize with antibiotics but that can't be removed due to recent stroke. Remains in refractory shock from infection which cannot be adequately treated. Renal failure due renal emboli and likely permanent. Finally best recovery would be aphasia, dysphagia and right UE paralysis. Ultimately this is an non-survivable situation. The family has decided to transition to comfort care.   Best practice (evaluated daily)  Pain/Anxiety/Delirium protocol (if indicated): None Neuro vitals: every 2 hours AED's: Keppra and Valproic Acid  VAP protocol (if indicated): Intubated, maintain VAP protocols DVT prophylaxis: Heparin 3 times daily Nutrition Status:  tube feeds GI prophylaxis: Not indicated Urinary catheter: foley Glucose control: goal 140-180, as above Mobility/therapy needs: Bedrest Antibiotic de-escalation: Naficillin Home medication reconciliation: On hold Daily labs: CBC BMP Code Status: DNR Family Communication: Updated wife at bedside again   about poor prognosis. She has made him a DNR. Expect he might expire despite all aggressive measures. Multiple family members visited 2/26 during the day to say their goodbyes.  Disposition: ICU  Labs   CBC: Recent Labs  Lab 04/26/20 0159 04/26/20 1920 04/27/20 0415 04/28/20 0344 04/28/20 0500 04/28/20 0747 04/29/20 0415 2020-05-25 0442  WBC 9.6  --  12.8*  --   28.5*  --  24.7* 24.7*  NEUTROABS 8.1*  --  10.8*  --  23.9*  --  21.0* 20.7*  HGB 10.4*   < > 9.3* 8.5* 9.2* 7.8* 7.3* 8.2*  HCT 29.9*   < > 26.0* 25.0* 27.2* 23.0* 21.3* 23.4*  MCV 95.5  --  95.6  --  98.9  --  97.3 90.7  PLT 84*  --  92*  --  124*  --  78* 67*   < > = values in this interval not displayed.    Basic Metabolic Panel: Recent Labs  Lab 04/15/2020 1630 04/24/20 0556 04/25/20 0521 04/28/20 0500 04/28/20 0601 04/28/20 0747 04/28/20 1605 04/29/20 0415 04/29/20 1750 25-May-2020 0442  NA  --  148*   < > 142 142 143 139 137 135 136  K  --  3.4*   < > 5.2* 5.4* 4.8 4.6 4.2 3.9 3.9  CL  --  111   < > 107 106  --  101 98 100 100  CO2  --  22   < > 15* 16*  --  18* 19* 20* 21*  GLUCOSE  --  274*   < > 35* 26*  --  120* 275* 344* 283*  BUN  --  85*   < > 102* 96*  --  71* 52* 45* 39*  CREATININE  --  1.92*   < >  3.03* 2.98*  --  2.27* 1.87* 1.61* 1.59*  CALCIUM  --  7.5*   < > 7.0* 7.1*  --  6.9* 6.8* 6.7* 6.8*  MG 2.8* 2.8*  --  3.1*  --   --   --  2.6*  --  2.5*  PHOS 3.1 5.5*   < > 9.7* 9.9*  --  8.6* 8.1* 4.8* 4.9*   < > = values in this interval not displayed.   GFR: Estimated Creatinine Clearance: 57.4 mL/min (A) (by C-G formula based on SCr of 1.59 mg/dL (H)). Recent Labs  Lab 04/27/20 0415 04/28/20 0500 04/29/20 0415 04/12/2020 0442  WBC 12.8* 28.5* 24.7* 24.7*    Liver Function Tests: Recent Labs  Lab 04/26/20 0159 04/27/20 0415 04/27/20 1700 04/28/20 0500 04/28/20 0601 04/28/20 1605 04/29/20 0415 04/29/20 1750 04/22/2020 0442  AST 53* 45*  --  69*  --   --   --   --  333*  ALT <5 7  --  8  --   --   --   --  40  ALKPHOS 96 91  --  145*  --   --   --   --  140*  BILITOT 6.8* 6.4*  --  7.6*  --   --   --   --  11.7*  PROT 5.0* 5.1*  --  5.8*  --   --   --   --  4.9*  ALBUMIN 1.3* 1.1*   < > 1.2* 1.5* 1.6* 1.3* 1.3* 1.2*  1.1*   < > = values in this interval not displayed.   No results for input(s): LIPASE, AMYLASE in the last 168 hours. No results  for input(s): AMMONIA in the last 168 hours.  ABG    Component Value Date/Time   PHART 7.169 (LL) 04/28/2020 0747   PCO2ART 57.1 (H) 04/28/2020 0747   PO2ART 146 (H) 04/28/2020 0747   HCO3 20.8 04/28/2020 0747   TCO2 22 04/28/2020 0747   ACIDBASEDEF 7.0 (H) 04/28/2020 0747   O2SAT 98.0 04/28/2020 0747     Coagulation Profile: Recent Labs  Lab 04/27/20 0415  INR 1.5*    Cardiac Enzymes: No results for input(s): CKTOTAL, CKMB, CKMBINDEX, TROPONINI in the last 168 hours.  HbA1C: Hgb A1c MFr Bld  Date/Time Value Ref Range Status  May 05, 2020 12:17 PM 10.5 (H) 4.8 - 5.6 % Final    Comment:    (NOTE) Pre diabetes:          5.7%-6.4%  Diabetes:              >6.4%  Glycemic control for   <7.0% adults with diabetes   08/04/2019 05:25 PM 8.2 (H) 4.8 - 5.6 % Final    Comment:             Prediabetes: 5.7 - 6.4          Diabetes: >6.4          Glycemic control for adults with diabetes: <7.0     CBG: Recent Labs  Lab 04/29/20 1059 04/29/20 2030 04/29/20 2323 04/27/2020 0313 04/03/2020 0736  GLUCAP 130* 175* 215* 152* 125*    Critical care time:     The patient is critically ill due to septic shock, AKI, encephalopathy, respiratory failure.  Critical care was necessary to treat or prevent imminent or life-threatening deterioration.  Critical care was time spent personally by me on the following activities: development of treatment plan with patient and/or surrogate as well as  nursing, discussions with consultants, evaluation of patient's response to treatment, examination of patient, obtaining history from patient or surrogate, ordering and performing treatments and interventions, ordering and review of laboratory studies, ordering and review of radiographic studies, pulse oximetry, re-evaluation of patient's condition and participation in multidisciplinary rounds.   Critical Care Time devoted to patient care services described in this note is 35 minutes. This time reflects  time of care of this signee Ravi Agarwala . This critical care time does not reflect separately billable procedures or procedure time, teaching time or supervisory time of PA/NP/Med student/Med Resident etc but could involve care discussion time.       Kerry Dory [facility] 04/13/2020 12:33 PM  Pager: see AMION After hours pager: [phone]  If no response to pager , please call [phone] until 7pm After 7:00 pm call Elink  [phone]

## 2020-05-01 NOTE — Progress Notes (Signed)
Nutrition Brief Note  Chart reviewed. Pt now transitioning to comfort care.  No further nutrition interventions planned at this time.  Please re-consult as needed.   Heather P., RD, LDN, CNSC See AMiON for contact information    

## 2020-05-01 NOTE — Progress Notes (Signed)
ID Brief Progress Note:   Noted goals of care to now be focused on comfort.   Will sign off with no further plans for treatment.   Rexene Alberts, MSN, NP-C [facility]  [email or web address] Pager: [phone] Office: [phone] [facility] Main Line: [phone]

## 2020-05-01 NOTE — Progress Notes (Signed)
STROKE TEAM PROGRESS NOTE   INTERVAL HISTORY Patient transitioned to comfort care measures only and withdrawal of life support this a.m. patient's wife and son are at the bedside and I spoke to him and answered questions about his neurological condition and prognosis.  Vitals:   04/05/2020 1000 04/06/2020 1100 04/16/2020 1200 04/11/2020 1300  BP: (!) 116/59 140/67    Pulse: 83 83 88   Resp: 20 (!) 23 (!) 22   Temp: 97.88 F (36.6 C) 98.42 F (36.9 C) 99.5 F (37.5 C)   TempSrc:      SpO2: 100% 100% 99%   Weight:    95.4 kg  Height:    5\' 6"  (1.676 m)   CBC:  Recent Labs  Lab 04/29/20 0415 04/24/2020 0442  WBC 24.7* 24.7*  NEUTROABS 21.0* 20.7*  HGB 7.3* 8.2*  HCT 21.3* 23.4*  MCV 97.3 90.7  PLT 78* 67*   Basic Metabolic Panel:  Recent Labs  Lab 04/29/20 0415 04/29/20 1750 04/15/2020 0442  NA 137 135 136  K 4.2 3.9 3.9  CL 98 100 100  CO2 19* 20* 21*  GLUCOSE 275* 344* 283*  BUN 52* 45* 39*  CREATININE 1.87* 1.61* 1.59*  CALCIUM 6.8* 6.7* 6.8*  MG 2.6*  --  2.5*  PHOS 8.1* 4.8* 4.9*   Lipid Panel:  Recent Labs  Lab 04/24/20 0556  TRIG 187*   HgbA1c:  No results for input(s): HGBA1C in the last 168 hours. Urine Drug Screen:  No results for input(s): LABOPIA, COCAINSCRNUR, LABBENZ, AMPHETMU, THCU, LABBARB in the last 168 hours.  Alcohol Level No results for input(s): ETH in the last 168 hours.  IMAGING past 24 hours No results found. PHYSICAL EXAM  Temp:  [96.62 F (35.9 C)-101.12 F (38.4 C)] 99.5 F (37.5 C) (02/28 1200) Pulse Rate:  [76-141] 88 (02/28 1200) Resp:  [14-32] 22 (02/28 1200) BP: (93-140)/(47-92) 140/67 (02/28 1100) SpO2:  [93 %-100 %] 99 % (02/28 1200) Arterial Line BP: (78-129)/(41-68) 112/42 (02/28 1200) FiO2 (%):  [40 %] 40 % (02/28 0800) Weight:  [95.4 kg] 95.4 kg (02/28 1300)  General - Well nourished, well developed, intubated on sedation.  Ophthalmologic - fundi not visualized due to noncooperation.  Cardiovascular - regular  rhythm but tachycardic not in afib.  Neuro - intubated on sedation, eyes closed, not following commands. With forced eye opening, eyes in mid position, not blinking to visual threat, doll's eyes absent, not tracking, pupil b/l 76mm, sluggish. Corneal reflex may be weakly presenton the left but not on the right, gag and cough present. Breathing over the vent.  Facial symmetry not able to test due to ET tube.  Tongue protrusion not cooperative. Unresponsive to painful stimuli x all 4 extremities. DTR diminished and no babinski. Sensation, coordination and gait not tested.   ASSESSMENT/PLAN Mr. ROJELIO UHRICH is a 55 y.o. male with history of previous cryptogenic Left MCA stroke s/p PFO closure, lost to stroke follow up 2018, DM2, HTN, GERD presenting with aphasia, right facial droop and right sided weakness noted upon waking up at 0300 this morning. He slid to the ground from the bed after awakening. EMS was called and he was brought to ED in code stroke status. NIHSS of 23 was noted upon arrival.   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  No aneurysm  CTP - Ischemic area 37cc, ischemic core 17ml by CBF<30%, CBV - 32ml  MRI - Moderate to large acute left MCA territory cortical/subcortical infarct affecting the left frontal and parietal lobes, as well as left insula and subinsular region. 1.8 cm acute cortical/subcortical left occipital lobe infarct, a few additional punctate acute infarcts are questioned within the right frontoparietal white matter (versus artifact). Redemonstrated small volume subarachnoid hemorrhage overlying the anterior right frontal lobe.  CT head x 3 - unchanged with persistent encephalopathy. It is reasonable to update HCT to contribute to prognosis discussion.   2D Echo EF 60-65%. No  shunt. Septal Repair:25 mm Amplazter on 08/01/2016 noted.    TEE Large 1x2 cm vegetation associated with the Amplatzer atrial septal occlusion device  LDL, dierct 15.3  HgbA1c 10.5  Beta-2-glycoprotein repeat 94   DVT PPX: SCDS  On ASA 81mg  PTA, now no antiplatelets due to thrombocytopenia and endocarditis with SAH concerning for mycotic aneurysm  Therapy recommendations:  TBD  Disposition:  TBD  Per Dr. discussion with wife undertaken, patient now DNR. Prognosis is grim per discussion with Dr. Celine Mans.   Seizure  EEG 2/20 potential epileptogenicity arising from left temporal region as well as moderate diffuse encephalopathy  LTM 2/21 multiple seizures without clinical signs arising from left temporoparietal region   LTM 2/22 two seizures without clinical signs arising from left temporoparietal region, improving from previous day  LTM 2/23 epileptogenicity arising from left temporoparietal region. Additionally there ismoderate diffuse encephalopathy, nonspecific etiology.  LTM 2/24 multiple seizures without clinical signs arising from left temporoparietal region, one seizure every few hours, lasting 1 to 2 minutes each. Moderate diffuse encephalopathy   LTM 2/25 brief ictal-interictal rhythmic discharges arising from left temporal parietal region which is on the ictal-interictal continuum with high potential for seizures.Additionally there ismoderate diffuse encephalopathy  LTM discontinued 2/26  On Keppra 1000 bid; valproic acid 500 tid and vimpat 100 bid  Antiphospholipid syndrome  07/2016 beta-2 glycoprotein IgG = 57 in the setting of stroke  11/2016 = 90 - 3 months repeat  04/2020 = 94 in the setting of stroke  Will need long term AC once appropriate  afib RVR, newly developed  Developed overnight 2/22  On amiodarone IV gtt  Cardizem IV x 1 ; Metoprolol prn  May need long term AC once appropriate  Hx of stroke PFO closure  07/2016 patient had left  MCA small infarct.  MRA head and neck negative.  DVT, TTE, pan CT negative.  TCD bubble study showed a small PFO.  TEE positive for PFO.  LDL 68 and A1c 5.6.  THC positive.  Hypercoagulable work-up showed beta-2 glycoprotein IgG 57.  Discharged with aspirin.  PFO closure done in 08/2016.  Repeat TTE showed good PFO closure procedure.  He was put on DAPT and Lipitor after PFO closure.  Repeat beta-2 glycoprotein IgG in 11/2016 was 90.  However, patient was lost follow-up with neurology.  Antiphospholipid syndrome was not diagnosed and was not treated.  MSSA bacteremia Septic shock Bacterial meningitis Endocarditis UTI Fever  Tmax 101.3->103->103.5->102.3->103.2, hypothermic to 95 on 2/26-2/27.  Leukocytosis 10.8->9.8->7.5->9.6->12.8  Lactic acid 7.5->5.8->4.5->2.9 (final)  Antibiotics narrowed to nafcillin and rifampin  B Cx MSSA, serial Blood cultures NGTD  TEE Large 1x2 cm vegetation associated with the Amplatzer atrial septal occlusion device  Urine Cx - MSSA  CSF WBC 90 (Neutro 71%, lymph 29%), RBC 57, protein 117, glucose 104 (blood glucose 300s), Cx NGTD  ID on board  C.diff checked and negative 2/25  Management per CCM  Respiratory failure  Tachypnea with respiratory distress  Intubated 2/20  Not candidate for extubation  CCM on board   BP management . HTN meds at home: Norvasc 10mg  daily, hctz 12.5mg  daily, not ordered. . Hypotension: On levophed and vasopressin per CCM . On MIVF, FW 100 Q4 . Keep BP at least greater than 100 systolic.  Long-term BP goal normotensive  Hyperlipidemia  Home meds: None. Per chart review he stopped statin 2018 d/t myalgia  TG 327   LDL direct 15.3, goal < 70  No statin indicated now given low LDL  Diabetes type II UnControlled  Home meds:  Glucophage 500mg  daily   HgbA1c 10.5, goal < 7.0  Hyperosmolar hyperglycemic on admission  Was on insulin drip -> lantus 30U->40U bid  Persistent  hyperglycemia  CBGs  SSI  Close PCP follow up after discharge  Thrombocytopenia  Plt 74->62->45->47->63->76->84->92  Likely reactive to current critical illness  Hepatitis panel non-reactive  ASA discontinued  HOLD antiplatelet meds   on heparin subq Q8   AKI, Renal failure  AKI Cre 1.55->1.23->1.62->1.92->2.93->3.45->4.45->2.27  On FW  CCM on board  Nephrology consulted.   2019 abdomen no hydronephrosis  On CRRT   Other Stroke Risk Factors   Former Cigarette smoker  Other Active Problems  Metabolic Acidosis: on CRRT  Hyponatremia->hypernatremia, Na 136->140->145->148->149->151->143->142 - on FW Patient is general medical condition and neurological condition is poor with prognosis for recovery being uncertain likely require prolonged ventilatory support and 24-hour care and dialysis.  Family has decided on comfort care measures only and withdrawal of ventilatory support.  Discussed with patient's wife and Dr. critical care medicine. This patient is critically ill and at significant risk of neurological worsening, death and care requires constant monitoring of vital signs, hemodynamics,respiratory and cardiac monitoring, extensive review of multiple databases, frequent neurological assessment, discussion with family, other specialists and medical decision making of high complexity.I have made any additions or clarifications directly to the above note.This critical care time does not reflect procedure time, or teaching time or supervisory time of PA/NP/Med Resident etc but could involve care discussion time.  I spent 30 minutes of neurocritical care time  in the care of  this patient.     [place], MD  To contact Stroke Continuity provider, please refer to Denese Killings. After hours, contact General Neurology

## 2020-05-01 NOTE — Progress Notes (Signed)
OT Cancellation Note  Patient Details Name: Zachary Woods MRN: [id number] DOB: [birth year]   Cancelled Treatment:    Reason Eval/Treat Not Completed: Patient not medically ready (Intubated. Sedated. Family meeting possibly later today.) Will sign off. Please re-consult as needed.  Amanda Pote M Akirra Lacerda Weiland Tomich MSOT, OTR/L Acute Rehab Pager: [phone] Office: [phone] 04/29/2020, 8:27 AM

## 2020-05-01 NOTE — Telephone Encounter (Signed)
Call placed to spouse Denyse Amass to express condolences. Left VM that I will try to reach her at another time. No call back needed at this time.

## 2020-05-01 NOTE — Progress Notes (Signed)
[facility] Progress Note  Name: Zachary Woods MRN: [id number] DOB: [birth year]  Subjective:  Patient was seen and examined on CRT today.  Remains anuric.  Not on pressors.  Even over the past 24 hours.  Family meeting today and decision to move towards comfort.  I discussed this with 2 family members at the bedside.  Review of systems:  Unable to obtain 2/2 intubated and sedated  ------------------ Background on consult:  Zachary Woods Zachary Woods is a 55 y.o. male with a history of diabetes, hypertension, CVA, and GERD who presented to the hospital with right sided weakness on 2/18.  He was found to have acute stroke - left MCA.  Course has been complicated by MSSA bacteremia and endocarditis.  Per report felt to have developed vegetation on closure device for a PFO placed a few years ago.  He has been hypotensive requiring levo and vasopressin.  Course has also been complicated by acute kidney injury with a BUN of up to 161 and Cr 4.42 today.  Nephrology is consulted for assistance with management of AKI.  Got CT angio on 2/18.  Home meds include ibuprofen, meloxicam and HCTZ.  Renal US with no hydro and normal echogenicity.  Noted incidental finding of contour nodularity of liver suggesting cirrhosis.  He had 725 mL Uop over 2/24.  He has been on levo at 25 mcg/min and vaso.  Spoke with his wife via phone and discussed risks/benefits/indications for renal replacement therapy and she consented for renal replacement therapy.     Intake/Output Summary (Last 24 hours) at 05-15-2020 1159 Last data filed at May 15, 2020 1000 Gross per 24 hour  Intake 7501.69 ml  Output 7166 ml  Net 335.69 ml    Vitals:  Vitals:   05/15/2020 0745 05-15-2020 0800 05-15-20 0900 05/15/2020 1000  BP: (!) 115/59 (!) 111/59 117/65 (!) 116/59  Pulse: 79 76 78 83  Resp: (!) 28 19 20 20   Temp: (!) 96.62 F (35.9 C) (!) 96.62 F (35.9 C) (!) 97.34 F (36.3 C) 97.88 F (36.6 C)  TempSrc:  Esophageal    SpO2: 100%  100% 100% 100%  Weight:      Height:         Physical Exam:  General:  Adult male critically ill. intubated  HEENT: NCAT  Neck: trachea midline; normal circumference Heart: normal rate Lungs: coarse mechanical breath sounds and crackles Abdomen:mild distention, soft Extremities: 1+ pitting edema in all 4 extremities Skin: discoloration of multiple toes  Neuro: sedation running   Medications reviewed   Labs:  BMP Latest Ref Rng & Units 05/15/2020 04/29/2020 04/29/2020  Glucose 70 - 99 mg/dL 05/01/2020) 762(G) 315(V)  BUN 6 - 20 mg/dL 761(Y) 07(P) 71(G)  Creatinine 0.61 - 1.24 mg/dL 62(I) 9.48(N) 4.62(V)  BUN/Creat Ratio 9 - 20 - - -  Sodium 135 - 145 mmol/L 136 135 137  Potassium 3.5 - 5.1 mmol/L 3.9 3.9 4.2  Chloride 98 - 111 mmol/L 100 100 98  CO2 22 - 32 mmol/L 21(L) 20(L) 19(L)  Calcium 8.9 - 10.3 mg/dL 0.35(K) 6.7(L) 6.8(L)    Assessment/Plan:   # AKI  - Secondary to ischemic and pre-renal ATN with acute stroke, shock, contrast.  May have also had septic emboli to kidneys.  Baseline Cr around 1. Renal 0.9(F no hydro, normal echogenicity.  UA with UTI and 30 mg/dl and [place] RBC.  Note up/cr ratio 1970 mg/g - CRRT on 4K bath - Would stop CRT when family is ready for  devices to stop  # Septic shock  - with bacteremia and endocarditis   - on antibiotics per primary team  # Hypoglycemia - can stop IV glucose given serum glucose has been within normal limits  # MSSA bacteremia - with endocarditis - abx per primary team   # Acute CVA  - per neuro    We will sign off at this time given the decision to move to comfort care  Darnell Level, MD 04/10/2020  11:59 AM

## 2020-05-01 DEATH — deceased

## 2020-05-02 NOTE — Telephone Encounter (Signed)
7:24 PM  Call placed to Zachary Woods, no answer.

## 2020-06-01 NOTE — Death Summary Note (Signed)
DEATH SUMMARY   Patient Details  Name: Zachary Woods MRN: [id number] DOB: [birth year]  Admission/Discharge Information   Admit Date:  04-22-2020  Date of Death: Date of Death: 05-02-2020  Time of Death: Time of Death: 1250  Length of Stay: 06/13/2022  Referring Physician: Shade Flood, MD   Reason(s) for Hospitalization  Acute CVA  Diagnoses  Preliminary cause of death: Septic shock Secondary Diagnoses (including complications and co-morbidities):  Principal Problem:   MSSA bacteremia Active Problems:   PFO (patent foramen ovale)   Acute CVA (cerebrovascular accident) (HCC)   Subarachnoid hemorrhage (HCC)   Type 2 diabetes mellitus with hyperosmolar hyperglycemic state (HHS) (HCC)   AKI (acute kidney injury) (HCC)   SIRS (systemic inflammatory response syndrome) (HCC)   Metabolic acidosis with increased anion gap and accumulation of organic acids   Hypomagnesemia   Hypokalemia   Thrombocytopenia (HCC)   CVA (cerebral vascular accident) (HCC)   Suspected endocarditis   Hyperglycemia   Seizure [facility])   Brief Hospital Course (including significant findings, care, treatment, and services provided and events leading to death)  Zachary Woods is a 55 y.o. year old male who presented with right-sided weakness.  Worsening mental status.  Intubated for airway protection.  MSSA bacteremia.  EEG showed seizures, sedation increased and anticonvulsants started.  Worsening peripheral embolic phenomena.  Transesophageal echo demonstrates large vegetation adhering to the patient's PFO Amplatzer closure device.  Spoke with cardiothoracic surgery, patient not amenable to surgery given recent stroke and sepsis.  Seen by infectious diseases, continued on nafcillin and rifampin added for synergy.  Worsening renal failure and atrial fibrillation.  Started on CRRT, vasopressor requirements increased.  Prognosis discussed with family: Ultimately, patient has no exit strategy as he has not a a suitable  surgical candidate and will not clear the infection and remains at risk for further embolic phenomena so long as the vegetation remains present.  He continues to be in vasopressor dependent shock and we are unable to remove fluid via CRRT.  Furthermore patient is encephalopathic and will likely be left with a significant disability from the stroke along.  Given the multiple medical problems the likelihood of a functional recovery is exceedingly poor and the family has opted to transition to comfort care.  Patient was compassionately extubated.  Pertinent Labs and Studies  Significant Diagnostic Studies EEG  Result Date: 04/22/2020 Zachary Quest, MD     04/22/2020  2:35 PM Patient Name: Zachary Woods MRN: [id number] Epilepsy Attending: Charlsie Woods Referring Physician/Provider: Dr Lynnell Catalan Date: 04/22/2020 Duration: 22.29 mins Patient history: 55 y.o.malewith history of previous cryptogenic Left MCA stroke s/p PFO closure, lost to stroke follow up 2018, DM2, HTN, GERDpresenting withaphasia, right facial droop and right sided weakness noted upon waking up at 0300 this morning. EEG to evaluate for seizure Level of alertness: lethargic AEDs during EEG study: None Technical aspects: This EEG study was done with scalp electrodes positioned according to the 10-20 International system of electrode placement. Electrical activity was acquired at a sampling rate of 500Hz  and reviewed with a high frequency filter of 70Hz  and a low frequency filter of 1Hz . EEG data were recorded continuously and digitally stored. Description: No posterior dominant rhythm was seen. EEG showed continuous generalized 3 to 6 Hz theta-delta slowing. Abundant ( one every 2-7 seconds) spikes were seen in left temporal region.  Hyperventilation and photic stimulation were not performed.   ABNORMALITY -Continuous slow, generalized -Spike, left temporal region. IMPRESSION: This study  showed evidence of potential epileptogenicity  arising  From left temporal region as well as moderate diffuse encephalopathy, nonspecific etiology. No seizures were seen throughout the recording. Dr Denese Killings was notified Zachary Woods   CT Code Stroke CTA Head W/WO contrast  Result Date: 04/04/2020 CLINICAL DATA:  Stroke suspected.  Weakness and fall EXAM: CT ANGIOGRAPHY HEAD AND NECK CT PERFUSION BRAIN TECHNIQUE: Multidetector CT imaging of the head and neck was performed using the standard protocol during bolus administration of intravenous contrast. Multiplanar CT image reconstructions and MIPs were obtained to evaluate the vascular anatomy. Carotid stenosis measurements (when applicable) are obtained utilizing NASCET criteria, using the distal internal carotid diameter as the denominator. Multiphase CT imaging of the brain was performed following IV bolus contrast injection. Subsequent parametric perfusion maps were calculated using RAPID software. CONTRAST:  Dose is currently not known COMPARISON:  Head CT from earlier today. Brain MRI and MRA 07/19/2016 FINDINGS: CTA NECK FINDINGS Aortic arch: Mild atheromatous plaque with 3 vessel branching. Right carotid system: Atheromatous plaque which is primarily calcified and at the proximal ICA. No flow limiting stenosis or ulceration. Mildly medial and retropharyngeal course the ICA. Left carotid system: Atheromatous plaque which is greater than the right, primarily seen at the distal common carotid to proximal ICA with mixed density irregular plaque at the bifurcation. Proximal left ICA stenosis measures 60%. No dissection or generalized beading Vertebral arteries: Subclavian plaque bilaterally, greater on the right. Atheromatous plaque at both vertebral ostia. The vertebral arteries are mildly narrowed at the origins but are otherwise smooth and widely patent to the dura. Skeleton: No acute finding Other neck: Negative Upper chest: No acute finding Review of the MIP images confirms the above findings CTA  HEAD FINDINGS Anterior circulation: Atheromatous plaque along both carotid siphons. Near trifurcation of the left MCA. The middle branch is rapidly attenuated, a new finding from 2018. Negative for aneurysm or generalized beading. Posterior circulation: Small appearing vessels symmetric to the anterior circulation. The vertebral and basilar arteries are smooth and widely patent. No cerebellar or PCA branch occlusion is seen. Negative for aneurysm or beading Venous sinuses: Negative in the arterial phase. Anatomic variants: None significant Review of the MIP images confirms the above findings CT Brain Perfusion Findings: ASPECTS: 10 CBF (<30%) Volume: 21mL Perfusion (Tmax>6.0s) volume: 37mL Mismatch Volume: 16mL Infarction Location:Left parietal Findings discussed with Dr. Amada Jupiter, who is already aware. IMPRESSION: 1. Left MCA branch occlusion when compared with 2018. Although at the M2 level, the vessel is smaller than the other M2 vessels. CT perfusion shows an ischemic area of 37 cc with core infarct of 21 cc. 2. Premature atherosclerosis for age. Most notably is a 60% proximal left ICA stenosis due to irregular plaque. Electronically Signed   By: Marnee Spring M.D.   On: 04/24/2020 04:45   CT HEAD WO CONTRAST  Result Date: 04/22/2020 CLINICAL DATA:  Stroke follow-up EXAM: CT HEAD WITHOUT CONTRAST TECHNIQUE: Contiguous axial images were obtained from the base of the skull through the vertex without intravenous contrast. COMPARISON:  April 21, 2020 FINDINGS: Brain: The large left MCA territory infarct is not significantly changed in size in the interval. Wispy high attenuation within the infarct is similar in the interval and could represent a small amount of associated blood products. A small left occipital infarct is stable. No midline shift identified. The cerebellum, brainstem, and basal cisterns are stable and unremarkable. Ventricles and sulci are stable. The subarachnoid hemorrhage adjacent to  the frontal lobe on the  right is stable. No other interval changes. Vascular: Calcified atherosclerosis in the intracranial carotids. Skull: Normal. Negative for fracture or focal lesion. Sinuses/Orbits: No acute finding. Other: None. IMPRESSION: 1. Non progressed left cerebral infarcts and right frontal subarachnoid hemorrhage. No significant interval change. Electronically Signed   By: Gerome Sam III M.D   On: 04/22/2020 12:31   CT HEAD WO CONTRAST  Result Date: 04/21/2020 CLINICAL DATA:  Follow-up stroke EXAM: CT HEAD WITHOUT CONTRAST TECHNIQUE: Contiguous axial images were obtained from the base of the skull through the vertex without intravenous contrast. COMPARISON:  Yesterday at 5:30 p.m. FINDINGS: Brain: Large left MCA territory infarction primarily affecting the posterior division. Small left occipital cortex infarct. Non progressed right frontal subarachnoid hemorrhage when compared to most recent prior. No hydrocephalus or midline shift. Vascular: No hyperdense vessel. Skull: Normal. Negative for fracture or focal lesion. Sinuses/Orbits: No acute finding. IMPRESSION: Non progressed left cerebral infarcts and right frontal subarachnoid hemorrhage. Electronically Signed   By: Marnee Spring M.D.   On: 04/21/2020 05:23   CT HEAD WO CONTRAST  Result Date: 04/22/2020 CLINICAL DATA:  Left MCA and left PCA territory infarcts, subarachnoid hemorrhage EXAM: CT HEAD WITHOUT CONTRAST TECHNIQUE: Contiguous axial images were obtained from the base of the skull through the vertex without intravenous contrast. COMPARISON:  04/14/2020 FINDINGS: Brain: Evaluation is limited by patient motion during the exam. Slight increase in the subarachnoid hemorrhage along the right frontal lobe, now measuring 17 x 5 mm where previously the hemorrhage had measured 4 x 9 mm. No mass effect. Expected evolution of the large left MCA territory infarct, with loss of gray-white differentiation and overlying sulcal effacement.  Progression of the left occipital lobe infarct seen on prior MRI as well, with loss of gray-white differentiation. There is effacement along the sylvian fissure without midline shift or significant mass effect. No evidence of hemorrhage within the infarct beds. The lateral ventricles and midline structures are unremarkable. Vascular: No hyperdense vessel or unexpected calcification. Skull: Normal. Negative for fracture or focal lesion. Sinuses/Orbits: No acute finding. Other: None. IMPRESSION: 1. Slightly limited evaluation due to patient motion. 2. Minimal increase in the amount of right frontal subarachnoid hemorrhage, which may be overestimated due to motion in this region during the exam. No surrounding mass effect. 3. Evolution of the left MCA and PCA territory infarcts, with loss of gray-white differentiation and sulcal effacement. No midline shift. No hemorrhagic transformation. Electronically Signed   By: Sharlet Salina M.D.   On: 04/25/2020 17:36   CT Code Stroke CTA Neck W/WO contrast  Result Date: 04/09/2020 CLINICAL DATA:  Stroke suspected.  Weakness and fall EXAM: CT ANGIOGRAPHY HEAD AND NECK CT PERFUSION BRAIN TECHNIQUE: Multidetector CT imaging of the head and neck was performed using the standard protocol during bolus administration of intravenous contrast. Multiplanar CT image reconstructions and MIPs were obtained to evaluate the vascular anatomy. Carotid stenosis measurements (when applicable) are obtained utilizing NASCET criteria, using the distal internal carotid diameter as the denominator. Multiphase CT imaging of the brain was performed following IV bolus contrast injection. Subsequent parametric perfusion maps were calculated using RAPID software. CONTRAST:  Dose is currently not known COMPARISON:  Head CT from earlier today. Brain MRI and MRA 07/19/2016 FINDINGS: CTA NECK FINDINGS Aortic arch: Mild atheromatous plaque with 3 vessel branching. Right carotid system: Atheromatous plaque  which is primarily calcified and at the proximal ICA. No flow limiting stenosis or ulceration. Mildly medial and retropharyngeal course the ICA. Left carotid system: Atheromatous  plaque which is greater than the right, primarily seen at the distal common carotid to proximal ICA with mixed density irregular plaque at the bifurcation. Proximal left ICA stenosis measures 60%. No dissection or generalized beading Vertebral arteries: Subclavian plaque bilaterally, greater on the right. Atheromatous plaque at both vertebral ostia. The vertebral arteries are mildly narrowed at the origins but are otherwise smooth and widely patent to the dura. Skeleton: No acute finding Other neck: Negative Upper chest: No acute finding Review of the MIP images confirms the above findings CTA HEAD FINDINGS Anterior circulation: Atheromatous plaque along both carotid siphons. Near trifurcation of the left MCA. The middle branch is rapidly attenuated, a new finding from 2018. Negative for aneurysm or generalized beading. Posterior circulation: Small appearing vessels symmetric to the anterior circulation. The vertebral and basilar arteries are smooth and widely patent. No cerebellar or PCA branch occlusion is seen. Negative for aneurysm or beading Venous sinuses: Negative in the arterial phase. Anatomic variants: None significant Review of the MIP images confirms the above findings CT Brain Perfusion Findings: ASPECTS: 10 CBF (<30%) Volume: 21mL Perfusion (Tmax>6.0s) volume: 37mL Mismatch Volume: 16mL Infarction Location:Left parietal Findings discussed with Dr. Amada Jupiter, who is already aware. IMPRESSION: 1. Left MCA branch occlusion when compared with 2018. Although at the M2 level, the vessel is smaller than the other M2 vessels. CT perfusion shows an ischemic area of 37 cc with core infarct of 21 cc. 2. Premature atherosclerosis for age. Most notably is a 60% proximal left ICA stenosis due to irregular plaque. Electronically Signed    By: Marnee Spring M.D.   On: 04/03/2020 04:45   MR BRAIN WO CONTRAST  Result Date: 04/26/2020 CLINICAL DATA:  Neuro deficit, acute, stroke suspected. EXAM: MRI HEAD WITHOUT CONTRAST TECHNIQUE: Multiplanar, multiecho pulse sequences of the brain and surrounding structures were obtained without intravenous contrast. COMPARISON:  Noncontrast head CT, CT angiogram head/neck and CT perfusion 04/29/2020. FINDINGS: Brain: Due to patient disorientation and significant motion degradation, the examination was prematurely terminated and axial and coronal T1 weighted sequences were not obtained. The acquired sequences are motion degraded. Most notably, there is moderate motion degradation of the diffusion-weighted sequences, moderate motion degradation of the axial T2 weighted and T2/FLAIR sequences, moderate motion degradation of the axial SWI sequence and severe motion degradation of the coronal T2 weighted sequence. Cerebral volume is normal. There is a moderate to large acute cortical/subcortical left MCA territory infarct affecting the left frontal and parietal lobes as well as left insula/subinsular region. No significant mass effect at this time. No hemorrhagic conversion. 1.8 cm acute cortical/subcortical infarct within the left occipital lobe (PCA vascular territory). A few scattered punctate acute infarcts are also questioned within the right frontoparietal white matter (versus artifact) Redemonstrated small volume acute subarachnoid hemorrhage along the anterior right frontal lobe. No significant white matter disease. No intracranial mass is identified. No midline shift Vascular: Left M2 branch occlusion was identified on the CTA head performed earlier today. Otherwise, there are expected proximal arterial flow voids. Skull and upper cervical spine: No evidence of focal marrow lesion on the acquired sequences. Sinuses/Orbits: Small left maxillary sinus mucous retention cyst. IMPRESSION: Motion degraded and  prematurely terminated examination, as described. Moderate to large acute left MCA territory cortical/subcortical infarct affecting the left frontal and parietal lobes, as well as left insula and subinsular region. 1.8 cm acute cortical/subcortical left occipital lobe infarct (left PCA vascular territory). A few additional punctate acute infarcts are questioned within the right frontoparietal white matter (  versus artifact). Redemonstrated small volume subarachnoid hemorrhage overlying the anterior right frontal lobe. Electronically Signed   By: Jackey Loge DO   On: 04/22/2020 09:37   US Abdomen Complete  Result Date: 04/25/2020 CLINICAL DATA:  Renal failure EXAM: ABDOMEN ULTRASOUND COMPLETE COMPARISON:  Ultrasound 04/21/2020 FINDINGS: Gallbladder: No shadowing stones. Unable to assess sonographic Eulah Pont due to ventilated patient. Interval development of marked gallbladder wall thickening measuring up to 11 mm. Common bile duct: Diameter: 2.8 mm Liver: Subtle contour nodularity suggestive of cirrhosis. No focal hepatic abnormality. Liver is slightly echogenic. Portal vein is patent on color Doppler imaging with normal direction of blood flow towards the liver. IVC: No abnormality visualized. Pancreas: Visualized portion unremarkable. Spleen: Size and appearance within normal limits. Right Kidney: Length: 9.1 cm. Echogenicity within normal limits. No mass or hydronephrosis visualized. Left Kidney: Length: 11.3 cm. Echogenicity within normal limits. No mass or hydronephrosis visualized. Abdominal aorta: No aneurysm visualized. Distal aorta obscured by bowel gas Other findings: None. IMPRESSION: 1. Negative for hydronephrosis. 2. Interval development of marked gallbladder wall thickening measuring up to 11 mm, but no evidence for shadowing stone. Findings could be secondary to a calculus cholecystitis versus gallbladder wall thickening secondary to liver disease or edema forming states. Correlation with nuclear  medicine hepatobiliary imaging could be considered. 3. Echogenic liver.  Subtle contour nodularity suggesting cirrhosis. Electronically Signed   By: Jasmine Pang M.D.   On: 04/25/2020 18:18   CT C-SPINE NO CHARGE  Result Date: 04/10/2020 CLINICAL DATA:  Status post ground level fall. Right-sided facial droop, right arm/leg weakness, slurred. EXAM: CT CERVICAL SPINE WITHOUT CONTRAST TECHNIQUE: Multidetector CT imaging of the cervical spine was performed without intravenous contrast. Multiplanar CT image reconstructions were also generated. COMPARISON:  None. FINDINGS: Alignment: Normal. Skull base and vertebrae: Multilevel degenerative changes of the spine. No acute fracture. No aggressive appearing focal osseous lesion or focal pathologic process. Soft tissues and spinal canal: No prevertebral fluid or swelling. No visible canal hematoma. Upper chest: Unremarkable. Other: Please see separately dictated CT angio head/neck. IMPRESSION: 1. No acute displaced fracture or traumatic listhesis of the cervical spine. 2. Please see separately dictated CT angio head/neck. Electronically Signed   By: Tish Frederickson M.D.   On: 04/29/2020 05:29   CT Code Stroke Cerebral Perfusion with contrast  Result Date: 04/27/2020 CLINICAL DATA:  Stroke suspected.  Weakness and fall EXAM: CT ANGIOGRAPHY HEAD AND NECK CT PERFUSION BRAIN TECHNIQUE: Multidetector CT imaging of the head and neck was performed using the standard protocol during bolus administration of intravenous contrast. Multiplanar CT image reconstructions and MIPs were obtained to evaluate the vascular anatomy. Carotid stenosis measurements (when applicable) are obtained utilizing NASCET criteria, using the distal internal carotid diameter as the denominator. Multiphase CT imaging of the brain was performed following IV bolus contrast injection. Subsequent parametric perfusion maps were calculated using RAPID software. CONTRAST:  Dose is currently not known  COMPARISON:  Head CT from earlier today. Brain MRI and MRA 07/19/2016 FINDINGS: CTA NECK FINDINGS Aortic arch: Mild atheromatous plaque with 3 vessel branching. Right carotid system: Atheromatous plaque which is primarily calcified and at the proximal ICA. No flow limiting stenosis or ulceration. Mildly medial and retropharyngeal course the ICA. Left carotid system: Atheromatous plaque which is greater than the right, primarily seen at the distal common carotid to proximal ICA with mixed density irregular plaque at the bifurcation. Proximal left ICA stenosis measures 60%. No dissection or generalized beading Vertebral arteries: Subclavian plaque bilaterally, greater on  the right. Atheromatous plaque at both vertebral ostia. The vertebral arteries are mildly narrowed at the origins but are otherwise smooth and widely patent to the dura. Skeleton: No acute finding Other neck: Negative Upper chest: No acute finding Review of the MIP images confirms the above findings CTA HEAD FINDINGS Anterior circulation: Atheromatous plaque along both carotid siphons. Near trifurcation of the left MCA. The middle branch is rapidly attenuated, a new finding from 2018. Negative for aneurysm or generalized beading. Posterior circulation: Small appearing vessels symmetric to the anterior circulation. The vertebral and basilar arteries are smooth and widely patent. No cerebellar or PCA branch occlusion is seen. Negative for aneurysm or beading Venous sinuses: Negative in the arterial phase. Anatomic variants: None significant Review of the MIP images confirms the above findings CT Brain Perfusion Findings: ASPECTS: 10 CBF (<30%) Volume: 71mL Perfusion (Tmax>6.0s) volume: 47mL Mismatch Volume: 54mL Infarction Location:Left parietal Findings discussed with Dr. Amada Jupiter, who is already aware. IMPRESSION: 1. Left MCA branch occlusion when compared with 2018. Although at the M2 level, the vessel is smaller than the other M2 vessels. CT  perfusion shows an ischemic area of 37 cc with core infarct of 21 cc. 2. Premature atherosclerosis for age. Most notably is a 60% proximal left ICA stenosis due to irregular plaque. Electronically Signed   By: Marnee Spring M.D.   On: 04/22/2020 04:45   DG CHEST PORT 1 VIEW  Result Date: 04/27/2020 CLINICAL DATA:  Central line placement EXAM: PORTABLE CHEST 1 VIEW COMPARISON:  Chest radiograph from one day prior. FINDINGS: Endotracheal tube tip is 3.1 cm above the carina. Enteric tube enters stomach with the tip not seen on this image. Right internal jugular central venous catheter terminates in the middle third of the SVC. Left internal jugular central venous catheter terminates at the cavoatrial junction. Stable cardiomediastinal silhouette with normal heart size. No pneumothorax. No pleural effusion. No pulmonary edema. Low lung volumes. Mild right basilar atelectasis. IMPRESSION: 1. Well-positioned support structures.  No pneumothorax. 2. Low lung volumes with mild right basilar atelectasis. Electronically Signed   By: Delbert Phenix M.D.   On: 04/27/2020 15:22   DG CHEST PORT 1 VIEW  Result Date: 04/26/2020 CLINICAL DATA:  Central line placement EXAM: PORTABLE CHEST 1 VIEW COMPARISON:  04/24/2020 FINDINGS: Single frontal view of the chest demonstrates endotracheal tube overlying tracheal air column tip at thoracic inlet. Enteric catheter passes below diaphragm tip excluded by collimation. With internal jugular central venous catheter tip overlies the right atrium. The cardiac silhouette is unremarkable. No airspace disease, effusion, or pneumothorax. No acute bony abnormalities. IMPRESSION: 1. No complication after left internal jugular catheter placement. 2. No acute intrathoracic process. Electronically Signed   By: Sharlet Salina M.D.   On: 04/26/2020 19:42   DG CHEST PORT 1 VIEW  Result Date: 04/24/2020 CLINICAL DATA:  Respiratory failure. EXAM: PORTABLE CHEST 1 VIEW COMPARISON:  April 22, 2020. FINDINGS: The heart size and mediastinal contours are within normal limits. Endotracheal and nasogastric tubes appear to be in grossly good position. No pneumothorax or pleural effusion is noted. Both lungs are clear. The visualized skeletal structures are unremarkable. IMPRESSION: No active disease. Electronically Signed   By: Lupita Raider M.D.   On: 04/24/2020 09:46   DG CHEST PORT 1 VIEW  Result Date: 04/22/2020 CLINICAL DATA:  55 year old male status post intubation. EXAM: PORTABLE CHEST 1 VIEW COMPARISON:  Earlier radiograph dated 04/21/2020. FINDINGS: Endotracheal tube with tip approximately 4.5 cm above the carina. Enteric  tube extends below the diaphragm with side-port in the left upper abdomen and tip beyond the inferior margin of the image. Patchy right lung base consolidation, new since the prior radiograph and may represent atelectasis. Infiltrate is not excluded clinical correlation is recommended. There is mild eventration of the right hemidiaphragm. No pleural effusion or pneumothorax. Mild cardiomegaly. Amplatzer device noted. No acute osseous pathology. IMPRESSION: 1. Endotracheal tube above the carina. 2. Patchy right lung base consolidation, new since the prior radiograph and may represent atelectasis. Electronically Signed   By: Elgie Collard M.D.   On: 04/22/2020 16:03   DG CHEST PORT 1 VIEW  Result Date: 04/21/2020 CLINICAL DATA:  Aspiration pneumonia. EXAM: PORTABLE CHEST 1 VIEW COMPARISON:  May 08, 2020 FINDINGS: The heart size and mediastinal contours are within normal limits. Both lungs are clear. The visualized skeletal structures are unremarkable. IMPRESSION: No active disease. Electronically Signed   By: Gerome Sam III M.D   On: 04/21/2020 10:03   DG Chest Port 1 View  Result Date: 08-May-2020 CLINICAL DATA:  Fall, code stroke EXAM: PORTABLE CHEST 1 VIEW COMPARISON:  07/19/2016 FINDINGS: Low lung volumes. Heart and mediastinal contours are within normal  limits. No focal opacities or effusions. No acute bony abnormality. IMPRESSION: Low volumes.  No active cardiopulmonary disease. Electronically Signed   By: Charlett Nose M.D.   On: 2020-05-08 05:17   DG Abd Portable 1V  Result Date: 04/24/2020 CLINICAL DATA:  OG tube placement EXAM: PORTABLE ABDOMEN - 1 VIEW COMPARISON:  None. FINDINGS: Enteric tube passes into the stomach. Unremarkable bowel gas pattern IMPRESSION: Enteric tube within the stomach. Electronically Signed   By: Guadlupe Spanish M.D.   On: 04/24/2020 15:44   EEG adult  Result Date: 05/08/2020 Zachary Quest, MD     2020/05/08  4:45 PM Patient Name: Zachary Woods MRN: [id number] Epilepsy Attending: Charlsie Woods Referring Physician/Provider: Shon Hale, NP Date: 2020/05/08 Duration: 29.18 mins Patient history: 55 y.o. male with history of previous cryptogenic Left MCA stroke s/p PFO closure, lost to stroke follow up 2018, DM2, HTN, GERD presenting with aphasia, right facial droop and right sided weakness noted upon waking up at 0300 this morning. EEG to evaluate for seizure Level of alertness: lethargic AEDs during EEG study: Technical aspects: This EEG study was done with scalp electrodes positioned according to the 10-20 International system of electrode placement. Electrical activity was acquired at a sampling rate of 500Hz  and reviewed with a high frequency filter of 70Hz  and a low frequency filter of 1Hz . EEG data were recorded continuously and digitally stored. Description: EEG showed continuous generalized 3 to 6 Hz theta-delta slowing. Hyperventilation and photic stimulation were not performed.   ABNORMALITY -Continuous slow, generalized IMPRESSION: This study is suggestive of moderate diffuse encephalopathy, nonspecific etiology. No seizures or epileptiform discharges were seen throughout the recording. Zachary Woods   Overnight EEG with video  Result Date: 04/24/2020 Zachary Quest, MD     04/24/2020 11:01 AM  Patient Name: Zachary Woods MRN: [id number] Epilepsy Attending: Charlsie Woods Referring Physician/Provider: Dr Lynnell Catalan Duration: 04/22/2020 1343 to 04/04/2020 1343  Patient history: 55 y.o.malewith history of previous cryptogenic Left MCA stroke s/p PFO closure, lost to stroke follow up 2018, DM2, HTN, GERDpresenting withaphasia, right facial droop and right sided weakness noted upon waking up at 0300 this morning.EEG to evaluate for seizure  Level of alertness: lethargic, asleep  AEDs during EEG study: LEV  Technical aspects: This EEG study was  done with scalp electrodes positioned according to the 10-20 International system of electrode placement. Electrical activity was acquired at a sampling rate of 500Hz  and reviewed with a high frequency filter of 70Hz  and a low frequency filter of 1Hz . EEG data were recorded continuously and digitally stored.  Description: No posterior dominant rhythm was seen.  Sleep was characterized by sleep spindles (12 to 14 Hz), maximal frontocentral region. EEG showed continuous generalized 3 to 6 Hz theta-delta slowing which had triphasic morphology at times when patient was awake/stimulated. Spikes were seen in left temporal region.    After around 830 on 2/21/122, patient was noted to have seizures without clinical signs arising from left temporoparietal region, last seizure at 1341, avg duration 1.8minutes. Hyperventilation and photic stimulation were not performed.    ABNORMALITY -Seizures without clinical signs, left temporo-parietal region -Spike, left temporal region -Continuous slow, generalized IMPRESSION: This study showed multiple seizures without clinical signs arising from left temporoparietal region, last seizure at 1341, avg duration 1.73minutes. Additionally there is moderate diffuse encephalopathy, nonspecific etiology.  Zachary Woods   ECHOCARDIOGRAM COMPLETE  Result Date: 04/05/2020    ECHOCARDIOGRAM REPORT   Patient Name:   Zachary Woods  Date of Exam: 04/04/2020 Medical Rec #:  [id number]      Height:       67.0 in Accession #:    [id number]     Weight:       171.5 lb Date of Birth:  1965/05/20      BSA:          1.894 m Patient Age:    55 years       BP:           119/88 mmHg Patient Gender: M              HR:           119 bpm. Exam Location:  Inpatient Procedure: 2D Echo, Cardiac Doppler and Color Doppler Indications:    Stroke 434.91 / I163.9  History:        Patient has prior history of Echocardiogram examinations, most                 recent 02/04/2017. Stroke; Risk Factors:Diabetes and                 Hypertension. Septal Repair:25MM Amplazter on 08/01/2016.  Sonographer:    Leta Jungling RDCS Referring Phys: [id number] RONDELL A SMITH  Sonographer Comments: Difficult study, patient is unable to remain still during echo. IMPRESSIONS  1. Septal Repair:25 mm Amplazter on 08/01/2016.  2. Left ventricular ejection fraction, by estimation, is 60 to 65%. The left ventricle has normal function. The left ventricle has no regional wall motion abnormalities. There is mild left ventricular hypertrophy. Left ventricular diastolic parameters are indeterminate.  3. Right ventricular systolic function is normal. The right ventricular size is normal.  4. The mitral valve is normal in structure. No evidence of mitral valve regurgitation. No evidence of mitral stenosis.  5. The aortic valve is normal in structure. Aortic valve regurgitation is not visualized. No aortic stenosis is present.  6. The inferior vena cava is normal in size with greater than 50% respiratory variability, suggesting right atrial pressure of 3 mmHg. Conclusion(s)/Recommendation(s): No intracardiac source of embolism detected on this transthoracic study. A transesophageal echocardiogram is recommended to exclude cardiac source of embolism if clinically indicated. FINDINGS  Left Ventricle: Left ventricular ejection fraction, by estimation, is 60 to 65%. The left  ventricle has normal function.  The left ventricle has no regional wall motion abnormalities. The left ventricular internal cavity size was normal in size. There is  mild left ventricular hypertrophy. Left ventricular diastolic parameters are indeterminate. Right Ventricle: The right ventricular size is normal. No increase in right ventricular wall thickness. Right ventricular systolic function is normal. Left Atrium: Left atrial size was normal in size. Right Atrium: Right atrial size was normal in size. Pericardium: There is no evidence of pericardial effusion. Mitral Valve: The mitral valve is normal in structure. No evidence of mitral valve regurgitation. No evidence of mitral valve stenosis. Tricuspid Valve: The tricuspid valve is normal in structure. Tricuspid valve regurgitation is not demonstrated. No evidence of tricuspid stenosis. Aortic Valve: The aortic valve is normal in structure. Aortic valve regurgitation is not visualized. No aortic stenosis is present. Pulmonic Valve: The pulmonic valve was normal in structure. Pulmonic valve regurgitation is not visualized. No evidence of pulmonic stenosis. Aorta: The aortic root is normal in size and structure. Venous: The inferior vena cava is normal in size with greater than 50% respiratory variability, suggesting right atrial pressure of 3 mmHg. IAS/Shunts: No atrial level shunt detected by color flow Doppler. Additional Comments: Septal Repair:25 mm Amplazter on 08/01/2016.  LEFT VENTRICLE PLAX 2D LVIDd:         4.50 cm LVIDs:         4.40 cm LV PW:         1.40 cm LV IVS:        1.30 cm LVOT diam:     2.20 cm LV SV:         43 LV SV Index:   22 LVOT Area:     3.80 cm  LEFT ATRIUM             Index LA diam:        3.20 cm 1.69 cm/m LA Vol (A2C):   21.0 ml 11.09 ml/m LA Vol (A4C):   18.2 ml 9.61 ml/m LA Biplane Vol: 20.5 ml 10.82 ml/m  AORTIC VALVE LVOT Vmax:   97.90 cm/s LVOT Vmean:  63.800 cm/s LVOT VTI:    0.112 m  AORTA Ao Root diam: 2.90 cm  SHUNTS Systemic VTI:  0.11 m Systemic  Diam: 2.20 cm Donato Schultz MD Electronically signed by Donato Schultz MD Signature Date/Time: 04/15/2020/2:25:43 PM    Final    ECHO TEE  Result Date: 04/24/2020    TRANSESOPHOGEAL ECHO REPORT   Patient Name:   Zachary Woods Skalla Date of Exam: 04/24/2020 Medical Rec #:  [id number]      Height:       66.0 in Accession #:    [id number]     Weight:       193.1 lb Date of Birth:  [birth year]      BSA:          1.970 m Patient Age:    55 years       BP:           95/69 mmHg Patient Gender: M              HR:           117 bpm. Exam Location:  Inpatient Procedure: Transesophageal Echo, Cardiac Doppler, Color Doppler and 3D Echo Indications:     Bacteremia  History:         Patient has prior history of Echocardiogram examinations.  Stroke, Signs/Symptoms:Bacteremia and Fever; Risk                  Factors:Hypertension, Dyslipidemia and Diabetes. PFO closure,                  seizure, MSSA.  Sonographer:     Lavenia Atlas Referring Phys:  [id number] Manson Passey Diagnosing Phys: Zachary Shutter MD PROCEDURE: The transesophogeal probe was passed without difficulty through the esophogus of the patient. Sedation performed by performing physician. The patient was monitored while under deep sedation. The patient developed no complications during the procedure. IMPRESSIONS  1. Left ventricular ejection fraction, by estimation, is 60 to 65%. The left ventricle has normal function. There is mild left ventricular hypertrophy.  2. Right ventricular systolic function is normal. The right ventricular size is normal.  3. No left atrial/left atrial appendage thrombus was detected.  4. Pedunculated, extends toward the septal Amplatzer device vegetation on the mitral valve.  5. The mitral valve is abnormal. Trivial mitral valve regurgitation.  6. The aortic valve is tricuspid. Aortic valve regurgitation is not visualized.  7. Evidence of atrial level shunting detected by color flow Doppler. Agitated saline contrast did not  cross right to left, due to left to right flow from ASD. Prior 24 mm Amplatzer device in place. There is a large 1 x 2 cm vegetation which is adherent to the device and appears to extend to the posterior mitral leaflet.. There is a small at the inferior rim of the Amplatzer device atrial septal defect with predominantly left to right shunting across the atrial septum. Conclusion(s)/Recommendation(s): Findings are concerning for vegetation/infective endocarditis as detailed above. Critical findings reported to Dr. Denese Killings and acknowledged at 1:00 pm on 04/24/20. FINDINGS  Left Ventricle: Left ventricular ejection fraction, by estimation, is 60 to 65%. The left ventricle has normal function. The left ventricular internal cavity size was normal in size. There is mild left ventricular hypertrophy. Right Ventricle: The right ventricular size is normal. No increase in right ventricular wall thickness. Right ventricular systolic function is normal. Left Atrium: Left atrial size was normal in size. No left atrial/left atrial appendage thrombus was detected. Right Atrium: Right atrial size was normal in size. Pericardium: There is no evidence of pericardial effusion. Mitral Valve: The mitral valve is abnormal. A pedunculated, extends toward the septal Amplatzer device vegetation is seen on the posterior mitral leaflet. Trivial mitral valve regurgitation. Tricuspid Valve: The tricuspid valve is grossly normal. Tricuspid valve regurgitation is trivial. Aortic Valve: The aortic valve is tricuspid. Aortic valve regurgitation is not visualized. Pulmonic Valve: The pulmonic valve was grossly normal. Pulmonic valve regurgitation is not visualized. Aorta: The aortic root and ascending aorta are structurally normal, with no evidence of dilitation. IAS/Shunts: Evidence of atrial level shunting detected by color flow Doppler. Agitated saline contrast was given intravenously to evaluate for intracardiac shunting. Agitated saline  contrast bubble study was positive with shunting observed within 3-6 cardiac cycles suggestive of interatrial shunt. There is no evidence of an atrial septal defect. There is a small at the inferior rim of the Amplatzer device atrial septal defect with predominantly left to right shunting across the atrial septum. Prior 24 mm Amplatzer device in place. There is a large 1 x 2 cm vegetation which is adherent to the device and appears to extend to the posterior mitral leaflet. Zachary Shutter MD Electronically signed by Zachary Shutter MD Signature Date/Time: 04/24/2020/2:58:03 PM    Final    CT HEAD CODE STROKE  WO CONTRAST  Result Date: 04/09/2020 CLINICAL DATA:  Code stroke.  Left-sided weakness EXAM: CT HEAD WITHOUT CONTRAST TECHNIQUE: Contiguous axial images were obtained from the base of the skull through the vertex without intravenous contrast. COMPARISON:  07/19/2016 FINDINGS: Brain: Small volume subarachnoid hemorrhage at the right frontal pole. Reportedly there was a fall. No visible infarct, mass, or hydrocephalus. Vascular: No hyperdense vessel or unexpected calcification. Skull: Normal. Negative for fracture or focal lesion. Sinuses/Orbits: No acute finding. Other: Critical Value/emergent results were called by telephone at the time of interpretation on 04/19/2020 at 4:25 am to provider Zadie Rhine , who verbally acknowledged these results. ASPECTS [facility] Stroke Program Early CT Score) - Ganglionic level infarction (caudate, lentiform nuclei, internal capsule, insula, M1-M3 cortex): 7 - Supraganglionic infarction (M4-M6 cortex): 3 Total score (0-10 with 10 being normal): 10 IMPRESSION: 1. No visible infarct. 2. Small volume subarachnoid hemorrhage along the right frontal convexity. Electronically Signed   By: Marnee Spring M.D.   On: 04/27/2020 04:26   VAS US CAROTID  Result Date: 04/21/2020 Carotid Arterial Duplex Study Indications:   CVA and Right side weakness, speech disturbance, and AMS. Risk  Factors:  Hypertension, Diabetes, past history of smoking, prior CVA. Other Factors: S/p PFO closure - 08/2016. Performing Technologist: Ernestene Mention  Examination Guidelines: A complete evaluation includes B-mode imaging, spectral Doppler, color Doppler, and power Doppler as needed of all accessible portions of each vessel. Bilateral testing is considered an integral part of a complete examination. Limited examinations for reoccurring indications may be performed as noted.  Right Carotid Findings: +----------+--------+--------+--------+------------------+------------------+           PSV cm/sEDV cm/sStenosisPlaque DescriptionComments           +----------+--------+--------+--------+------------------+------------------+ CCA Prox  77      18              heterogenous      intimal thickening +----------+--------+--------+--------+------------------+------------------+ CCA Distal43      9               heterogenous      intimal thickening +----------+--------+--------+--------+------------------+------------------+ ICA Prox  57      21              calcific                             +----------+--------+--------+--------+------------------+------------------+ ICA Distal66      28                                                   +----------+--------+--------+--------+------------------+------------------+ ECA       77      12              calcific                             +----------+--------+--------+--------+------------------+------------------+ +----------+--------+-------+--------+-------------------+           PSV cm/sEDV cmsDescribeArm Pressure (mmHG) +----------+--------+-------+--------+-------------------+ [id number]      0                                  +----------+--------+-------+--------+-------------------+ +---------+--------+--+--------+--+---------+ VertebralPSV cm/s37EDV cm/s13Antegrade +---------+--------+--+--------+--+---------+   Left Carotid Findings: +----------+-------+--------+--------+-----------------------+-----------------+  PSV    EDV cm/sStenosisPlaque Description     Comments                    cm/s                                                            +----------+-------+--------+--------+-----------------------+-----------------+ CCA Prox  73     16              heterogenous and smoothintimal                                                                   thickening        +----------+-------+--------+--------+-----------------------+-----------------+ CCA Distal68     19              heterogenous and smoothintimal                                                                   thickening        +----------+-------+--------+--------+-----------------------+-----------------+ ICA Prox  110    37      1-39%   heterogenous and                                                          calcific                                 +----------+-------+--------+--------+-----------------------+-----------------+ ICA Distal55     16                                                       +----------+-------+--------+--------+-----------------------+-----------------+ ECA       71     11                                                       +----------+-------+--------+--------+-----------------------+-----------------+ +----------+--------+--------+--------+-------------------+           PSV cm/sEDV cm/sDescribeArm Pressure (mmHG) +----------+--------+--------+--------+-------------------+ Subclavian60      0                                   +----------+--------+--------+--------+-------------------+ +---------+--------+--+--------+--+---------+ VertebralPSV cm/s40EDV cm/s18Antegrade +---------+--------+--+--------+--+---------+   Summary: Right Carotid: The extracranial  vessels were near-normal with only minimal wall                 thickening or plaque. Left Carotid: Velocities in the left ICA are consistent with a 1-39% stenosis. Vertebrals: Bilateral vertebral arteries demonstrate antegrade flow. *See table(s) above for measurements and observations.  Electronically signed by Heath Lark on 04/21/2020 at 10:58:38 AM.    Final    VAS [place] LOWER EXTREMITY VENOUS (DVT)  Result Date: 04/21/2020  Lower Venous DVT Study Indications: Stroke.  Limitations: Difficult exam due to patient's AMS and constant movement. Comparison Study: Previous exam 07/20/16 - negative Performing Technologist: Ernestene Mention  Examination Guidelines: A complete evaluation includes B-mode imaging, spectral Doppler, color Doppler, and power Doppler as needed of all accessible portions of each vessel. Bilateral testing is considered an integral part of a complete examination. Limited examinations for reoccurring indications may be performed as noted. The reflux portion of the exam is performed with the patient in reverse Trendelenburg.  +---------+---------------+---------+-----------+----------+--------------+ RIGHT    CompressibilityPhasicitySpontaneityPropertiesThrombus Aging +---------+---------------+---------+-----------+----------+--------------+ CFV      Full           Yes      Yes                                 +---------+---------------+---------+-----------+----------+--------------+ SFJ      Full                                                        +---------+---------------+---------+-----------+----------+--------------+ FV Prox  Full           Yes      Yes                                 +---------+---------------+---------+-----------+----------+--------------+ FV Mid   Full           Yes      Yes                                 +---------+---------------+---------+-----------+----------+--------------+ FV DistalFull           Yes      Yes                                  +---------+---------------+---------+-----------+----------+--------------+ PFV      Full                                                        +---------+---------------+---------+-----------+----------+--------------+ POP      Full           Yes      Yes                                 +---------+---------------+---------+-----------+----------+--------------+ PTV      Full                                                        +---------+---------------+---------+-----------+----------+--------------+  PERO     Full                                                        +---------+---------------+---------+-----------+----------+--------------+   +---------+---------------+---------+-----------+----------+--------------+ LEFT     CompressibilityPhasicitySpontaneityPropertiesThrombus Aging +---------+---------------+---------+-----------+----------+--------------+ CFV      Full           Yes      Yes                                 +---------+---------------+---------+-----------+----------+--------------+ SFJ      Full                                                        +---------+---------------+---------+-----------+----------+--------------+ FV Prox  Full           Yes      Yes                                 +---------+---------------+---------+-----------+----------+--------------+ FV Mid   Full           Yes      Yes                                 +---------+---------------+---------+-----------+----------+--------------+ FV DistalFull           Yes      Yes                                 +---------+---------------+---------+-----------+----------+--------------+ PFV      Full                                                        +---------+---------------+---------+-----------+----------+--------------+ POP      Full           Yes      Yes                                  +---------+---------------+---------+-----------+----------+--------------+ PTV      Full                                                        +---------+---------------+---------+-----------+----------+--------------+ PERO     Full                                                        +---------+---------------+---------+-----------+----------+--------------+  Summary: BILATERAL: - No evidence of deep vein thrombosis seen in the lower extremities, bilaterally. - No evidence of superficial venous thrombosis in the lower extremities, bilaterally. -No evidence of popliteal cyst, bilaterally.   *See table(s) above for measurements and observations. Electronically signed by Heath Lark on 04/21/2020 at 10:59:16 AM.    Final    US Abdomen Limited RUQ (LIVER/GB)  Result Date: 04/21/2020 CLINICAL DATA:  Elevated LFTs EXAM: ULTRASOUND ABDOMEN LIMITED RIGHT UPPER QUADRANT COMPARISON:  CT chest, abdomen and pelvis 07/20/2016 FINDINGS: Gallbladder: No gallstones or wall thickening visualized. Patient clinical status limits evaluation of a sonographic Murphy sign. Common bile duct: Diameter: 4 mm, nondilated Liver: No focal lesion identified. Previously seen focus in segment 4 on prior CT imaging is not well visualized on today's exam. Within normal limits in parenchymal echogenicity. Smooth surface contour. No intrahepatic biliary ductal dilatation. Portal vein is patent on color Doppler imaging with normal direction of blood flow towards the liver. Other: None. IMPRESSION: Unremarkable right upper quadrant ultrasound. Electronically Signed   By: Kreg Shropshire M.D.   On: 04/21/2020 00:19    Microbiology Recent Results (from the past 240 hour(s))  Culture, blood (routine x 2)     Status: None   Collection Time: 04/04/2020 10:01 AM   Specimen: BLOOD LEFT HAND  Result Value Ref Range Status   Specimen Description BLOOD LEFT HAND  Final   Special Requests   Final    BOTTLES DRAWN AEROBIC ONLY Blood  Culture results may not be optimal due to an excessive volume of blood received in culture bottles   Culture   Final    NO GROWTH 5 DAYS Performed at [facility], [address] [place], [place] [address]    Report Status 04/28/2020 FINAL  Final  HSV 1/2 Ab IgG/IgM CSF     Status: None   Collection Time: 04/22/2020 11:26 AM   Specimen: Lumbar Puncture; Cerebrospinal Fluid  Result Value Ref Range Status   HSV 1/2 Ab, IgM, CSF 0.32 <=0.89 IV Final    Comment: (NOTE) INTERPRETIVE INFORMATION: Herpes Simplex Virus                          Type 1 and/or 2 Antibodies,                          IgM by ELISA, CSF  0.89 IV or Less .......... Negative: No significant                             level of detectable HSV IgM                             antibody.  0.90 - 1.09 IV ........... Equivocal: Questionable                             presence of IgM antibodies.                             Repeat testing in 10-14 days                             may be helpful.  1.10 IV or Greater .....Marland KitchenMarland Kitchen  Positive: IgM antibody to HSV                             detected, which may indicate a                             current or recent infection.                             However, low levels of IgM                             antibodies may occasionally                             persist for more than 12                             months post-infection. The detection of antibodies to herpes simplex virus in CSF may indicate central nervou s system infection. However, consideration must be given to possible contamination by blood or transfer of serum antibodies across the blood-brain barrier. Fourfold or greater rise in CSF antibodies to herpes on specimens at least 4 weeks apart are found in 74-94 % of patients with herpes encephalitis. Specificity of the test based on a single CSF testing is not established. Presently PCR is the primary means of establishing a diagnosis of herpes  encephalitis. This test was developed and its performance characteristics determined by [facility]. It has not been cleared or approved by the [place] Food and Drug Administration. This test was performed in a CLIA certified laboratory and is intended for clinical purposes.    HSV 1/2 Ab Screen IgG, CSF <0.34 <=0.89 IV Final    Comment: (NOTE) INTERPRETIVE INFORMATION: Herpes Simplex Virus Type 1 and/or 2                    Antibodies, IgG CSF  0.89 IV or Less .......... Negative: No significant                             level of detectable HSV IgG                             antibody.  0.90 - 1.09 IV ........... Equivocal: Questionable                             presence of IgG antibodies.                             Repeat testing in 10-14 days                             may be helpful.  1.10 IV or Greater ....... Positive: IgG antibody to HSV                             detected, which may indicate  a current or past HSV                             infection. The detection of antibodies to herpes simplex virus in CSF may indicate central nervous system infection. However, consideration must be given to possible contamination by blood or transfer of serum antibodies across the blood-brain barrier. Fourfold or greater rise in CSF antibodies to herpes on specimens at l east 4 weeks apart are found in 74-94 % of patients with herpes encephalitis. Specificity of the test based on a single CSF testing is not established. Presently PCR is the primary means of establishing a diagnosis of herpes encephalitis. This test was developed and its performance characteristics determined by [facility]. It has not been cleared or approved by the [place] Food and Drug Administration. This test was performed in a CLIA certified laboratory and is intended for clinical purposes. Performed At: [facility] [address] [place], [place]  [id number] Elinor ParkinsonGeorge Tracy I MD WU:[id number]:[phone]   CSF culture     Status: None   Collection Time: 04/27/2020 11:42 AM   Specimen: CSF; Cerebrospinal Fluid  Result Value Ref Range Status   Specimen Description CSF  Final   Special Requests LUMBAR  Final   Gram Stain   Final    CYTOSPIN SMEAR WBC PRESENT, PREDOMINANTLY MONONUCLEAR NO ORGANISMS SEEN    Culture   Final    NO GROWTH 3 DAYS Performed at [facility], [address] [place], [place] [address]    Report Status 04/26/2020 FINAL  Final  Culture, blood (routine x 2)     Status: None   Collection Time: 04/26/20  3:42 PM   Specimen: BLOOD LEFT HAND  Result Value Ref Range Status   Specimen Description BLOOD LEFT HAND  Final   Special Requests   Final    BOTTLES DRAWN AEROBIC ONLY Blood Culture adequate volume   Culture   Final    NO GROWTH 5 DAYS Performed at [facility], [address] [place], [place] [address]    Report Status 05/01/2020 FINAL  Final  Culture, blood (routine x 2)     Status: None   Collection Time: 04/26/20  3:42 PM   Specimen: BLOOD RIGHT HAND  Result Value Ref Range Status   Specimen Description BLOOD RIGHT HAND  Final   Special Requests   Final    BOTTLES DRAWN AEROBIC ONLY Blood Culture adequate volume   Culture   Final    NO GROWTH 5 DAYS Performed at [facility], [address] [place], [place] [address]    Report Status 05/01/2020 FINAL  Final  C Difficile Quick Screen (NO PCR Reflex)     Status: None   Collection Time: 04/27/20 11:50 AM   Specimen: STOOL  Result Value Ref Range Status   C Diff antigen NEGATIVE NEGATIVE Final   C Diff toxin NEGATIVE NEGATIVE Final   C Diff interpretation No C. difficile detected.  Final    Comment: Performed at [facility], [address] [place], [place] [address]    Lab Basic Metabolic Panel: Recent Labs  Lab 04/28/20 0500 04/28/20 0601 04/28/20 0747 04/28/20 1605 04/29/20 0415 04/29/20 1750 03/09/2020 0442  NA 142  142 143 139 137 135 136  K 5.2* 5.4* 4.8 4.6 4.2 3.9 3.9  CL 107 106  --  101 98 100 100  CO2 15* 16*  --  18* 19* 20* 21*  GLUCOSE 35* 26*  --  120* 275* 344* 283*  BUN 102* 96*  --  71* 52* 45* 39*  CREATININE 3.03* 2.98*  --  2.27* 1.87* 1.61* 1.59*  CALCIUM 7.0* 7.1*  --  6.9* 6.8* 6.7* 6.8*  MG 3.1*  --   --   --  2.6*  --  2.5*  PHOS 9.7* 9.9*  --  8.6* 8.1* 4.8* 4.9*   Liver Function Tests: Recent Labs  Lab 04/26/20 0159 04/27/20 0415 04/27/20 1700 04/28/20 0500 04/28/20 0601 04/28/20 1605 04/29/20 0415 04/29/20 1750 04/05/2020 0442  AST 53* 45*  --  69*  --   --   --   --  333*  ALT <5 7  --  8  --   --   --   --  40  ALKPHOS 96 91  --  145*  --   --   --   --  140*  BILITOT 6.8* 6.4*  --  7.6*  --   --   --   --  11.7*  PROT 5.0* 5.1*  --  5.8*  --   --   --   --  4.9*  ALBUMIN 1.3* 1.1*   < > 1.2* 1.5* 1.6* 1.3* 1.3* 1.2*  1.1*   < > = values in this interval not displayed.   No results for input(s): LIPASE, AMYLASE in the last 168 hours. No results for input(s): AMMONIA in the last 168 hours. CBC: Recent Labs  Lab 04/26/20 0159 04/26/20 1920 04/27/20 0415 04/28/20 0344 04/28/20 0500 04/28/20 0747 04/29/20 0415 04/22/2020 0442  WBC 9.6  --  12.8*  --  28.5*  --  24.7* 24.7*  NEUTROABS 8.1*  --  10.8*  --  23.9*  --  21.0* 20.7*  HGB 10.4*   < > 9.3* 8.5* 9.2* 7.8* 7.3* 8.2*  HCT 29.9*   < > 26.0* 25.0* 27.2* 23.0* 21.3* 23.4*  MCV 95.5  --  95.6  --  98.9  --  97.3 90.7  PLT 84*  --  92*  --  124*  --  78* 67*   < > = values in this interval not displayed.   Cardiac Enzymes: No results for input(s): CKTOTAL, CKMB, CKMBINDEX, TROPONINI in the last 168 hours. Sepsis Labs: Recent Labs  Lab 04/27/20 0415 04/28/20 0500 04/29/20 0415 04/04/2020 0442  WBC 12.8* 28.5* 24.7* 24.7*    Procedures/Operations  Mechanical ventilation, CRRT, transesophageal echocardiography.   Latravis Grine 05/01/2020, 1:44 PM

## 2020-06-04 DIAGNOSIS — Z978 Presence of other specified devices: Secondary | ICD-10-CM
